# Patient Record
Sex: Female | Born: 1950 | Race: White | Hispanic: No | Marital: Single | Smoking: Current every day smoker
Health system: Southern US, Community
[De-identification: ages and names within clinical notes are randomized; demographics above are authoritative.]

## PROBLEM LIST (undated history)

## (undated) DIAGNOSIS — K7460 Unspecified cirrhosis of liver: Secondary | ICD-10-CM

## (undated) DIAGNOSIS — R109 Unspecified abdominal pain: Secondary | ICD-10-CM

## (undated) DIAGNOSIS — I629 Nontraumatic intracranial hemorrhage, unspecified: Secondary | ICD-10-CM

## (undated) DIAGNOSIS — Z9889 Other specified postprocedural states: Secondary | ICD-10-CM

## (undated) DIAGNOSIS — M1990 Unspecified osteoarthritis, unspecified site: Secondary | ICD-10-CM

## (undated) DIAGNOSIS — M545 Low back pain, unspecified: Secondary | ICD-10-CM

## (undated) DIAGNOSIS — K759 Inflammatory liver disease, unspecified: Secondary | ICD-10-CM

## (undated) DIAGNOSIS — G8929 Other chronic pain: Secondary | ICD-10-CM

## (undated) DIAGNOSIS — K317 Polyp of stomach and duodenum: Secondary | ICD-10-CM

## (undated) DIAGNOSIS — F4310 Post-traumatic stress disorder, unspecified: Secondary | ICD-10-CM

## (undated) DIAGNOSIS — R0602 Shortness of breath: Secondary | ICD-10-CM

## (undated) DIAGNOSIS — K219 Gastro-esophageal reflux disease without esophagitis: Secondary | ICD-10-CM

## (undated) DIAGNOSIS — R918 Other nonspecific abnormal finding of lung field: Secondary | ICD-10-CM

## (undated) DIAGNOSIS — F1910 Other psychoactive substance abuse, uncomplicated: Secondary | ICD-10-CM

## (undated) DIAGNOSIS — B182 Chronic viral hepatitis C: Secondary | ICD-10-CM

## (undated) DIAGNOSIS — Z720 Tobacco use: Secondary | ICD-10-CM

## (undated) DIAGNOSIS — A549 Gonococcal infection, unspecified: Secondary | ICD-10-CM

## (undated) DIAGNOSIS — I10 Essential (primary) hypertension: Secondary | ICD-10-CM

## (undated) DIAGNOSIS — R569 Unspecified convulsions: Secondary | ICD-10-CM

## (undated) DIAGNOSIS — K8020 Calculus of gallbladder without cholecystitis without obstruction: Secondary | ICD-10-CM

## (undated) DIAGNOSIS — D649 Anemia, unspecified: Secondary | ICD-10-CM

## (undated) DIAGNOSIS — F329 Major depressive disorder, single episode, unspecified: Secondary | ICD-10-CM

## (undated) DIAGNOSIS — F32A Depression, unspecified: Secondary | ICD-10-CM

## (undated) DIAGNOSIS — K561 Intussusception: Secondary | ICD-10-CM

## (undated) HISTORY — DX: Chronic viral hepatitis C: B18.2

## (undated) HISTORY — PX: SPINAL FUSION: SHX223

## (undated) HISTORY — DX: Other nonspecific abnormal finding of lung field: R91.8

## (undated) HISTORY — DX: Depression, unspecified: F32.A

## (undated) HISTORY — PX: TONSILLECTOMY: SUR1361

## (undated) HISTORY — DX: Other psychoactive substance abuse, uncomplicated: F19.10

## (undated) HISTORY — DX: Calculus of gallbladder without cholecystitis without obstruction: K80.20

## (undated) HISTORY — DX: Gonococcal infection, unspecified: A54.9

## (undated) HISTORY — DX: Unspecified abdominal pain: R10.9

## (undated) HISTORY — DX: Inflammatory liver disease, unspecified: K75.9

## (undated) HISTORY — DX: Post-traumatic stress disorder, unspecified: F43.10

## (undated) HISTORY — PX: LUMBAR FUSION: SHX111

## (undated) HISTORY — DX: Low back pain: M54.5

## (undated) HISTORY — DX: Major depressive disorder, single episode, unspecified: F32.9

## (undated) HISTORY — DX: Gastro-esophageal reflux disease without esophagitis: K21.9

## (undated) HISTORY — DX: Low back pain, unspecified: M54.50

## (undated) HISTORY — DX: Unspecified cirrhosis of liver: K74.60

## (undated) HISTORY — DX: Other chronic pain: G89.29

## (undated) HISTORY — DX: Nontraumatic intracranial hemorrhage, unspecified: I62.9

## (undated) HISTORY — DX: Tobacco use: Z72.0

## (undated) HISTORY — DX: Other specified postprocedural states: Z98.890

## (undated) HISTORY — DX: Intussusception: K56.1

## (undated) HISTORY — DX: Essential (primary) hypertension: I10

## (undated) SURGERY — SUTURE REPAIR, LACERATION, PERINEUM
Anesthesia: Monitor Anesthesia Care

---
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
## 1996-08-23 DIAGNOSIS — R569 Unspecified convulsions: Secondary | ICD-10-CM

## 1996-08-23 DIAGNOSIS — I629 Nontraumatic intracranial hemorrhage, unspecified: Secondary | ICD-10-CM

## 1996-08-23 HISTORY — DX: Unspecified convulsions: R56.9

## 1996-08-23 HISTORY — DX: Nontraumatic intracranial hemorrhage, unspecified: I62.9

## 2000-08-23 DIAGNOSIS — K8050 Calculus of bile duct without cholangitis or cholecystitis without obstruction: Secondary | ICD-10-CM

## 2000-08-23 HISTORY — PX: CHOLECYSTECTOMY: SHX55

## 2000-08-23 HISTORY — PX: PERCUTANEOUS TRANSHEPATIC CHOLANGIOGRAHPY AND BILLIARY DRAINAGE: SHX2212

## 2000-08-23 HISTORY — DX: Calculus of bile duct without cholangitis or cholecystitis without obstruction: K80.50

## 2001-08-23 HISTORY — PX: OTHER SURGICAL HISTORY: SHX169

## 2002-09-24 ENCOUNTER — Encounter: Admission: RE | Admit: 2002-09-24 | Discharge: 2002-12-23 | Payer: Self-pay

## 2003-01-25 ENCOUNTER — Encounter
Admission: RE | Admit: 2003-01-25 | Discharge: 2003-04-25 | Payer: Self-pay | Admitting: Physical Medicine & Rehabilitation

## 2003-03-06 ENCOUNTER — Encounter: Payer: Self-pay | Admitting: Internal Medicine

## 2003-03-06 ENCOUNTER — Ambulatory Visit (HOSPITAL_COMMUNITY): Admission: RE | Admit: 2003-03-06 | Discharge: 2003-03-06 | Payer: Self-pay | Admitting: Internal Medicine

## 2003-04-22 ENCOUNTER — Encounter
Admission: RE | Admit: 2003-04-22 | Discharge: 2003-07-21 | Payer: Self-pay | Admitting: Physical Medicine & Rehabilitation

## 2003-05-29 ENCOUNTER — Encounter
Admission: RE | Admit: 2003-05-29 | Discharge: 2003-08-27 | Payer: Self-pay | Admitting: Physical Medicine & Rehabilitation

## 2003-07-22 ENCOUNTER — Encounter
Admission: RE | Admit: 2003-07-22 | Discharge: 2003-07-30 | Payer: Self-pay | Admitting: Physical Medicine & Rehabilitation

## 2003-07-31 ENCOUNTER — Encounter
Admission: RE | Admit: 2003-07-31 | Discharge: 2003-10-29 | Payer: Self-pay | Admitting: Physical Medicine & Rehabilitation

## 2003-08-24 DIAGNOSIS — Z9889 Other specified postprocedural states: Secondary | ICD-10-CM

## 2003-08-24 HISTORY — DX: Other specified postprocedural states: Z98.890

## 2003-09-10 ENCOUNTER — Ambulatory Visit (HOSPITAL_COMMUNITY): Admission: RE | Admit: 2003-09-10 | Discharge: 2003-09-10 | Payer: Self-pay | Admitting: Internal Medicine

## 2003-09-10 HISTORY — PX: COLONOSCOPY: SHX174

## 2003-09-10 HISTORY — PX: ESOPHAGOGASTRODUODENOSCOPY: SHX1529

## 2003-09-18 ENCOUNTER — Encounter
Admission: RE | Admit: 2003-09-18 | Discharge: 2003-12-17 | Payer: Self-pay | Admitting: Physical Medicine & Rehabilitation

## 2003-10-30 ENCOUNTER — Encounter
Admission: RE | Admit: 2003-10-30 | Discharge: 2003-12-31 | Payer: Self-pay | Admitting: Physical Medicine & Rehabilitation

## 2003-12-12 ENCOUNTER — Other Ambulatory Visit: Admission: RE | Admit: 2003-12-12 | Discharge: 2003-12-12 | Payer: Self-pay | Admitting: Family Medicine

## 2004-02-10 ENCOUNTER — Inpatient Hospital Stay (HOSPITAL_COMMUNITY): Admission: RE | Admit: 2004-02-10 | Discharge: 2004-02-12 | Payer: Self-pay | Admitting: Neurosurgery

## 2004-04-22 ENCOUNTER — Ambulatory Visit (HOSPITAL_COMMUNITY): Admission: RE | Admit: 2004-04-22 | Discharge: 2004-04-22 | Payer: Self-pay | Admitting: Internal Medicine

## 2004-07-21 ENCOUNTER — Ambulatory Visit: Payer: Self-pay | Admitting: Gastroenterology

## 2004-07-22 ENCOUNTER — Ambulatory Visit (HOSPITAL_COMMUNITY): Admission: RE | Admit: 2004-07-22 | Discharge: 2004-07-22 | Payer: Self-pay | Admitting: Internal Medicine

## 2004-08-03 ENCOUNTER — Ambulatory Visit (HOSPITAL_COMMUNITY): Admission: RE | Admit: 2004-08-03 | Discharge: 2004-08-03 | Payer: Self-pay | Admitting: Internal Medicine

## 2004-12-21 ENCOUNTER — Ambulatory Visit (HOSPITAL_COMMUNITY): Admission: RE | Admit: 2004-12-21 | Discharge: 2004-12-21 | Payer: Self-pay | Admitting: Internal Medicine

## 2005-02-02 ENCOUNTER — Ambulatory Visit: Payer: Self-pay | Admitting: Gastroenterology

## 2005-03-30 ENCOUNTER — Emergency Department (HOSPITAL_COMMUNITY): Admission: EM | Admit: 2005-03-30 | Discharge: 2005-03-30 | Payer: Self-pay | Admitting: Emergency Medicine

## 2005-07-27 ENCOUNTER — Ambulatory Visit: Payer: Self-pay | Admitting: Internal Medicine

## 2005-07-29 ENCOUNTER — Other Ambulatory Visit: Admission: RE | Admit: 2005-07-29 | Discharge: 2005-07-29 | Payer: Self-pay | Admitting: Family Medicine

## 2005-09-10 ENCOUNTER — Ambulatory Visit (HOSPITAL_COMMUNITY): Admission: RE | Admit: 2005-09-10 | Discharge: 2005-09-10 | Payer: Self-pay | Admitting: Family Medicine

## 2005-09-10 ENCOUNTER — Ambulatory Visit (HOSPITAL_COMMUNITY): Admission: RE | Admit: 2005-09-10 | Discharge: 2005-09-10 | Payer: Self-pay | Admitting: Neurosurgery

## 2005-09-30 ENCOUNTER — Encounter
Admission: RE | Admit: 2005-09-30 | Discharge: 2005-12-29 | Payer: Self-pay | Admitting: Physical Medicine & Rehabilitation

## 2005-11-05 ENCOUNTER — Ambulatory Visit: Payer: Self-pay | Admitting: Physical Medicine & Rehabilitation

## 2005-11-22 ENCOUNTER — Encounter (HOSPITAL_COMMUNITY)
Admission: RE | Admit: 2005-11-22 | Discharge: 2005-12-22 | Payer: Self-pay | Admitting: Physical Medicine & Rehabilitation

## 2005-12-17 ENCOUNTER — Ambulatory Visit: Payer: Self-pay | Admitting: Physical Medicine & Rehabilitation

## 2005-12-29 ENCOUNTER — Encounter (HOSPITAL_COMMUNITY)
Admission: RE | Admit: 2005-12-29 | Discharge: 2006-01-28 | Payer: Self-pay | Admitting: Physical Medicine & Rehabilitation

## 2006-02-01 ENCOUNTER — Encounter (HOSPITAL_COMMUNITY)
Admission: RE | Admit: 2006-02-01 | Discharge: 2006-03-03 | Payer: Self-pay | Admitting: Physical Medicine & Rehabilitation

## 2006-02-09 ENCOUNTER — Encounter
Admission: RE | Admit: 2006-02-09 | Discharge: 2006-05-10 | Payer: Self-pay | Admitting: Physical Medicine & Rehabilitation

## 2006-02-09 ENCOUNTER — Ambulatory Visit: Payer: Self-pay | Admitting: Physical Medicine & Rehabilitation

## 2006-03-10 ENCOUNTER — Ambulatory Visit: Payer: Self-pay | Admitting: Internal Medicine

## 2006-03-17 ENCOUNTER — Encounter (HOSPITAL_COMMUNITY): Admission: RE | Admit: 2006-03-17 | Discharge: 2006-04-16 | Payer: Self-pay | Admitting: Internal Medicine

## 2006-04-21 ENCOUNTER — Ambulatory Visit: Payer: Self-pay | Admitting: Physical Medicine & Rehabilitation

## 2006-05-27 ENCOUNTER — Ambulatory Visit: Payer: Self-pay | Admitting: Physical Medicine & Rehabilitation

## 2006-05-27 ENCOUNTER — Encounter
Admission: RE | Admit: 2006-05-27 | Discharge: 2006-08-25 | Payer: Self-pay | Admitting: Physical Medicine & Rehabilitation

## 2006-06-20 ENCOUNTER — Ambulatory Visit: Payer: Self-pay | Admitting: Physical Medicine & Rehabilitation

## 2006-07-26 ENCOUNTER — Ambulatory Visit: Payer: Self-pay | Admitting: Internal Medicine

## 2006-09-01 ENCOUNTER — Ambulatory Visit (HOSPITAL_COMMUNITY): Admission: RE | Admit: 2006-09-01 | Discharge: 2006-09-01 | Payer: Self-pay | Admitting: Internal Medicine

## 2006-09-16 ENCOUNTER — Encounter
Admission: RE | Admit: 2006-09-16 | Discharge: 2006-12-15 | Payer: Self-pay | Admitting: Physical Medicine & Rehabilitation

## 2006-09-19 ENCOUNTER — Ambulatory Visit: Payer: Self-pay | Admitting: Physical Medicine & Rehabilitation

## 2006-12-06 ENCOUNTER — Ambulatory Visit (HOSPITAL_COMMUNITY): Admission: RE | Admit: 2006-12-06 | Discharge: 2006-12-06 | Payer: Self-pay | Admitting: Internal Medicine

## 2007-02-10 ENCOUNTER — Ambulatory Visit: Payer: Self-pay | Admitting: Internal Medicine

## 2007-02-15 ENCOUNTER — Ambulatory Visit (HOSPITAL_COMMUNITY): Admission: RE | Admit: 2007-02-15 | Discharge: 2007-02-15 | Payer: Self-pay | Admitting: Internal Medicine

## 2007-05-19 ENCOUNTER — Ambulatory Visit: Payer: Self-pay | Admitting: Gastroenterology

## 2007-08-28 ENCOUNTER — Other Ambulatory Visit: Payer: Self-pay

## 2007-08-28 ENCOUNTER — Inpatient Hospital Stay (HOSPITAL_COMMUNITY): Admission: RE | Admit: 2007-08-28 | Discharge: 2007-08-31 | Payer: Self-pay | Admitting: Psychiatry

## 2007-08-28 ENCOUNTER — Ambulatory Visit: Payer: Self-pay | Admitting: Psychiatry

## 2007-10-02 ENCOUNTER — Ambulatory Visit (HOSPITAL_COMMUNITY): Admission: RE | Admit: 2007-10-02 | Discharge: 2007-10-02 | Payer: Self-pay | Admitting: Internal Medicine

## 2007-11-24 ENCOUNTER — Ambulatory Visit: Payer: Self-pay | Admitting: Internal Medicine

## 2008-01-01 ENCOUNTER — Ambulatory Visit (HOSPITAL_COMMUNITY): Admission: RE | Admit: 2008-01-01 | Discharge: 2008-01-01 | Payer: Self-pay | Admitting: Family Medicine

## 2008-04-22 DIAGNOSIS — K831 Obstruction of bile duct: Secondary | ICD-10-CM | POA: Insufficient documentation

## 2008-04-22 DIAGNOSIS — K561 Intussusception: Secondary | ICD-10-CM | POA: Insufficient documentation

## 2008-04-22 DIAGNOSIS — F4310 Post-traumatic stress disorder, unspecified: Secondary | ICD-10-CM | POA: Insufficient documentation

## 2008-04-22 DIAGNOSIS — R109 Unspecified abdominal pain: Secondary | ICD-10-CM | POA: Insufficient documentation

## 2008-04-22 DIAGNOSIS — B182 Chronic viral hepatitis C: Secondary | ICD-10-CM | POA: Insufficient documentation

## 2008-04-22 DIAGNOSIS — K660 Peritoneal adhesions (postprocedural) (postinfection): Secondary | ICD-10-CM | POA: Insufficient documentation

## 2008-04-22 DIAGNOSIS — M545 Low back pain, unspecified: Secondary | ICD-10-CM | POA: Insufficient documentation

## 2008-04-22 DIAGNOSIS — I629 Nontraumatic intracranial hemorrhage, unspecified: Secondary | ICD-10-CM | POA: Insufficient documentation

## 2008-04-22 DIAGNOSIS — K219 Gastro-esophageal reflux disease without esophagitis: Secondary | ICD-10-CM | POA: Insufficient documentation

## 2008-04-22 DIAGNOSIS — Z8619 Personal history of other infectious and parasitic diseases: Secondary | ICD-10-CM | POA: Insufficient documentation

## 2008-04-22 DIAGNOSIS — F329 Major depressive disorder, single episode, unspecified: Secondary | ICD-10-CM | POA: Insufficient documentation

## 2008-04-22 DIAGNOSIS — K7460 Unspecified cirrhosis of liver: Secondary | ICD-10-CM

## 2008-04-25 DIAGNOSIS — F17200 Nicotine dependence, unspecified, uncomplicated: Secondary | ICD-10-CM | POA: Insufficient documentation

## 2008-04-25 DIAGNOSIS — F1010 Alcohol abuse, uncomplicated: Secondary | ICD-10-CM | POA: Insufficient documentation

## 2008-05-21 ENCOUNTER — Ambulatory Visit (HOSPITAL_COMMUNITY): Admission: RE | Admit: 2008-05-21 | Discharge: 2008-05-21 | Payer: Self-pay | Admitting: Internal Medicine

## 2008-05-24 ENCOUNTER — Ambulatory Visit: Payer: Self-pay | Admitting: Internal Medicine

## 2008-10-16 ENCOUNTER — Encounter: Payer: Self-pay | Admitting: Urgent Care

## 2008-10-16 ENCOUNTER — Ambulatory Visit: Payer: Self-pay | Admitting: Internal Medicine

## 2008-10-16 ENCOUNTER — Encounter: Payer: Self-pay | Admitting: *Deleted

## 2008-10-16 DIAGNOSIS — R141 Gas pain: Secondary | ICD-10-CM | POA: Insufficient documentation

## 2008-10-16 DIAGNOSIS — G5600 Carpal tunnel syndrome, unspecified upper limb: Secondary | ICD-10-CM | POA: Insufficient documentation

## 2008-10-16 DIAGNOSIS — R142 Eructation: Secondary | ICD-10-CM

## 2008-10-16 DIAGNOSIS — R143 Flatulence: Secondary | ICD-10-CM

## 2008-10-31 ENCOUNTER — Ambulatory Visit (HOSPITAL_COMMUNITY): Admission: RE | Admit: 2008-10-31 | Discharge: 2008-10-31 | Payer: Self-pay | Admitting: Neurosurgery

## 2008-11-07 ENCOUNTER — Other Ambulatory Visit: Admission: RE | Admit: 2008-11-07 | Discharge: 2008-11-07 | Payer: Self-pay | Admitting: Obstetrics and Gynecology

## 2008-11-11 ENCOUNTER — Encounter (INDEPENDENT_AMBULATORY_CARE_PROVIDER_SITE_OTHER): Payer: Self-pay | Admitting: *Deleted

## 2008-11-14 ENCOUNTER — Encounter: Payer: Self-pay | Admitting: Internal Medicine

## 2008-11-19 ENCOUNTER — Encounter (INDEPENDENT_AMBULATORY_CARE_PROVIDER_SITE_OTHER): Payer: Self-pay

## 2008-11-19 ENCOUNTER — Encounter: Payer: Self-pay | Admitting: Urgent Care

## 2008-11-25 ENCOUNTER — Ambulatory Visit (HOSPITAL_COMMUNITY): Admission: RE | Admit: 2008-11-25 | Discharge: 2008-11-25 | Payer: Self-pay | Admitting: Internal Medicine

## 2008-12-09 ENCOUNTER — Encounter: Payer: Self-pay | Admitting: Urgent Care

## 2008-12-09 ENCOUNTER — Encounter: Admission: RE | Admit: 2008-12-09 | Discharge: 2009-03-09 | Payer: Self-pay | Admitting: Neurosurgery

## 2008-12-09 LAB — CONVERTED CEMR LAB
ALT: 25 units/L (ref 0–35)
AST: 29 units/L (ref 0–37)
Albumin: 4.2 g/dL (ref 3.5–5.2)
Alkaline Phosphatase: 129 units/L — ABNORMAL HIGH (ref 39–117)
Basophils Absolute: 0 10*3/uL (ref 0.0–0.1)
Basophils Relative: 0 % (ref 0–1)
Bilirubin, Direct: 0.1 mg/dL (ref 0.0–0.3)
Eosinophils Absolute: 0.2 10*3/uL (ref 0.0–0.7)
Eosinophils Relative: 2 % (ref 0–5)
HCT: 42.2 % (ref 36.0–46.0)
Hemoglobin: 14.5 g/dL (ref 12.0–15.0)
INR: 1 (ref 0.0–1.5)
Indirect Bilirubin: 0.5 mg/dL (ref 0.0–0.9)
Lymphocytes Relative: 29 % (ref 12–46)
Lymphs Abs: 2.6 10*3/uL (ref 0.7–4.0)
MCHC: 34.4 g/dL (ref 30.0–36.0)
MCV: 93.7 fL (ref 78.0–100.0)
Monocytes Absolute: 0.5 10*3/uL (ref 0.1–1.0)
Monocytes Relative: 6 % (ref 3–12)
Neutro Abs: 5.6 10*3/uL (ref 1.7–7.7)
Neutrophils Relative %: 63 % (ref 43–77)
Platelets: 167 10*3/uL (ref 150–400)
Prothrombin Time: 12.8 s (ref 11.6–15.2)
RBC: 4.5 M/uL (ref 3.87–5.11)
RDW: 12.7 % (ref 11.5–15.5)
Total Bilirubin: 0.6 mg/dL (ref 0.3–1.2)
Total Protein: 7.1 g/dL (ref 6.0–8.3)
WBC: 8.9 10*3/uL (ref 4.0–10.5)

## 2008-12-11 LAB — CONVERTED CEMR LAB: AFP-Tumor Marker: 2.9 ng/mL (ref 0.0–8.0)

## 2008-12-26 ENCOUNTER — Ambulatory Visit: Payer: Self-pay | Admitting: Internal Medicine

## 2008-12-30 ENCOUNTER — Encounter: Payer: Self-pay | Admitting: Internal Medicine

## 2009-01-01 ENCOUNTER — Ambulatory Visit (HOSPITAL_COMMUNITY): Admission: RE | Admit: 2009-01-01 | Discharge: 2009-01-01 | Payer: Self-pay | Admitting: Internal Medicine

## 2009-01-09 ENCOUNTER — Encounter: Payer: Self-pay | Admitting: Internal Medicine

## 2009-01-09 ENCOUNTER — Ambulatory Visit (HOSPITAL_COMMUNITY): Admission: RE | Admit: 2009-01-09 | Discharge: 2009-01-09 | Payer: Self-pay | Admitting: Internal Medicine

## 2009-05-01 ENCOUNTER — Encounter: Payer: Self-pay | Admitting: Urgent Care

## 2009-05-14 ENCOUNTER — Encounter: Admission: RE | Admit: 2009-05-14 | Discharge: 2009-05-22 | Payer: Self-pay | Admitting: Neurosurgery

## 2009-05-20 ENCOUNTER — Encounter (INDEPENDENT_AMBULATORY_CARE_PROVIDER_SITE_OTHER): Payer: Self-pay

## 2009-05-20 ENCOUNTER — Encounter: Payer: Self-pay | Admitting: Internal Medicine

## 2009-05-23 ENCOUNTER — Encounter: Admission: RE | Admit: 2009-05-23 | Discharge: 2009-08-21 | Payer: Self-pay | Admitting: Neurosurgery

## 2009-06-02 ENCOUNTER — Encounter: Payer: Self-pay | Admitting: Internal Medicine

## 2009-06-02 LAB — CONVERTED CEMR LAB: AFP-Tumor Marker: 5.7 ng/mL (ref 0.0–8.0)

## 2009-06-13 ENCOUNTER — Encounter (INDEPENDENT_AMBULATORY_CARE_PROVIDER_SITE_OTHER): Payer: Self-pay | Admitting: *Deleted

## 2009-06-23 ENCOUNTER — Encounter: Payer: Self-pay | Admitting: Urgent Care

## 2009-08-29 ENCOUNTER — Encounter: Payer: Self-pay | Admitting: Internal Medicine

## 2009-09-01 ENCOUNTER — Ambulatory Visit (HOSPITAL_COMMUNITY): Admission: RE | Admit: 2009-09-01 | Discharge: 2009-09-01 | Payer: Self-pay | Admitting: Internal Medicine

## 2009-09-22 ENCOUNTER — Inpatient Hospital Stay (HOSPITAL_COMMUNITY): Admission: RE | Admit: 2009-09-22 | Discharge: 2009-09-27 | Payer: Self-pay | Admitting: Neurosurgery

## 2009-11-10 ENCOUNTER — Encounter (INDEPENDENT_AMBULATORY_CARE_PROVIDER_SITE_OTHER): Payer: Self-pay

## 2009-12-04 ENCOUNTER — Other Ambulatory Visit: Admission: RE | Admit: 2009-12-04 | Discharge: 2009-12-04 | Payer: Self-pay | Admitting: Obstetrics and Gynecology

## 2009-12-16 ENCOUNTER — Encounter: Payer: Self-pay | Admitting: Internal Medicine

## 2009-12-16 LAB — CONVERTED CEMR LAB: AFP-Tumor Marker: 3.1 ng/mL (ref 0.0–8.0)

## 2010-01-05 ENCOUNTER — Telehealth (INDEPENDENT_AMBULATORY_CARE_PROVIDER_SITE_OTHER): Payer: Self-pay | Admitting: *Deleted

## 2010-02-16 ENCOUNTER — Ambulatory Visit (HOSPITAL_COMMUNITY): Admission: RE | Admit: 2010-02-16 | Discharge: 2010-02-16 | Payer: Self-pay | Admitting: Internal Medicine

## 2010-03-04 ENCOUNTER — Encounter: Admission: RE | Admit: 2010-03-04 | Discharge: 2010-06-04 | Payer: Self-pay | Admitting: Neurosurgery

## 2010-04-14 ENCOUNTER — Encounter (INDEPENDENT_AMBULATORY_CARE_PROVIDER_SITE_OTHER): Payer: Self-pay | Admitting: *Deleted

## 2010-04-20 ENCOUNTER — Encounter (INDEPENDENT_AMBULATORY_CARE_PROVIDER_SITE_OTHER): Payer: Self-pay | Admitting: *Deleted

## 2010-05-11 ENCOUNTER — Encounter (INDEPENDENT_AMBULATORY_CARE_PROVIDER_SITE_OTHER): Payer: Self-pay

## 2010-05-19 ENCOUNTER — Encounter: Payer: Self-pay | Admitting: Internal Medicine

## 2010-05-27 ENCOUNTER — Ambulatory Visit (HOSPITAL_COMMUNITY): Admission: RE | Admit: 2010-05-27 | Discharge: 2010-05-27 | Payer: Self-pay | Admitting: Internal Medicine

## 2010-06-05 ENCOUNTER — Encounter
Admission: RE | Admit: 2010-06-05 | Discharge: 2010-08-20 | Payer: Self-pay | Source: Home / Self Care | Attending: Neurosurgery | Admitting: Neurosurgery

## 2010-06-21 LAB — CONVERTED CEMR LAB: AFP-Tumor Marker: 3.8 ng/mL (ref 0.0–8.0)

## 2010-08-05 ENCOUNTER — Telehealth (INDEPENDENT_AMBULATORY_CARE_PROVIDER_SITE_OTHER): Payer: Self-pay

## 2010-08-05 ENCOUNTER — Ambulatory Visit: Payer: Self-pay | Admitting: Internal Medicine

## 2010-08-11 ENCOUNTER — Encounter: Payer: Self-pay | Admitting: Internal Medicine

## 2010-08-27 ENCOUNTER — Ambulatory Visit (HOSPITAL_COMMUNITY)
Admission: RE | Admit: 2010-08-27 | Discharge: 2010-08-27 | Payer: Self-pay | Source: Home / Self Care | Attending: Internal Medicine | Admitting: Internal Medicine

## 2010-08-27 DIAGNOSIS — Z9889 Other specified postprocedural states: Secondary | ICD-10-CM

## 2010-08-27 HISTORY — PX: ESOPHAGOGASTRODUODENOSCOPY: SHX1529

## 2010-08-27 HISTORY — DX: Other specified postprocedural states: Z98.890

## 2010-09-11 ENCOUNTER — Ambulatory Visit (HOSPITAL_COMMUNITY)
Admission: RE | Admit: 2010-09-11 | Discharge: 2010-09-11 | Payer: Self-pay | Source: Home / Self Care | Attending: Neurology | Admitting: Neurology

## 2010-09-13 ENCOUNTER — Encounter: Payer: Self-pay | Admitting: Family Medicine

## 2010-09-22 NOTE — Miscellaneous (Signed)
Summary: Orders Update  Clinical Lists Changes  Orders: Added new Test order of T-AFP Tumor Markers ([id number]) - Signed 

## 2010-09-22 NOTE — Letter (Signed)
Summary: ABD [place] ORDER  ABD [place] ORDER   Imported By: Ave Filter 08/29/2009 12:59:41  _____________________________________________________________________  External Attachment:    Type:   Image     Comment:   External Document

## 2010-09-22 NOTE — Letter (Signed)
Summary: Chest CT Order  Chest CT Order   Imported By: Ave Filter 12/30/2008 12:39:18  _____________________________________________________________________  External Attachment:    Type:   Image     Comment:   External Document

## 2010-09-22 NOTE — Letter (Signed)
Summary: Recall, Labs Needed  [facility]  [address]   [place], [place] [address]   Phone: [phone]  Fax: [phone]    May 11, 2010  FATE GALANTI [address] [place], [place]  [address] 1950-12-20   Dear Ms. Cuello,   Our records indicate it is time to repeat your blood work.  You can take the enclosed form to the lab on or near the date indicated.  Please make note of the new location of the lab:   [address], 2nd floor   [facility]  Our office will call you within a week to ten business days with the results.  If you do not hear from [place] in 10 business days, you should call the office.  If you have any questions regarding this, call the office at [phone], and ask for the nurse.  Labs are due on 06/17/2010.   Sincerely,    Hendricks Limes LPN  [facility] Ph: [phone]   Fax: [phone]

## 2010-09-22 NOTE — Miscellaneous (Signed)
Summary: Orders Update  Clinical Lists Changes  Orders: Added new Test order of T-AFP Tumor Marker [id number]) - Signed Added new Test order of T-Hepatic Function [id number]) - Signed Added new Test order of T-CBC w/Diff ([id number]) - Signed Added new Test order of T-PT (Prothrombin Time) ([id number]) - Signed

## 2010-09-22 NOTE — Op Note (Signed)
  NAME:  Sabrina Wyatt, Sabrina Wyatt              ACCOUNT NO.:  [id number]  MEDICAL RECORD NO.:  [id number]          PATIENT TYPE:  AMB  LOCATION:  DAY                           FACILITY:  [facility]  PHYSICIAN:  R. Roetta Sessions, M.D. DATE OF BIRTH:  [birth year]  DATE OF PROCEDURE:  08/27/2010 DATE OF DISCHARGE:                              OPERATIVE REPORT   SCREENING EGD  INDICATIONS FOR PROCEDURE:  A  60 year old lady with known cirrhosis secondary to hepatitis C (status post documented eradication with antiviral therapy) and history of EtOH abuse, comes for screening EGD to check for varices.  She has done very well clinically more recently.  No history of varices on prior EGD.  EGD now being done.  Risks, benefits, limitations, alternatives, imponderables have been discussed, questions answered.  Please see the documentation in the medical record.  PROCEDURE NOTE:  O2 saturation, blood pressure, pulse, and respirations were monitored throughout the entire procedure.  SEDATION:  Propofol sedation per Anesthesia.  Cetacaine spray for topical pharyngeal anesthesia.  INSTRUMENT:  Pentax video chip system.  FINDINGS:  Examination of the tubular esophagus revealed normal- appearing esophageal mucosa.  There were no varices.  EG junction easily traversed.  Stomach:  Gastric cavity was emptied, insufflated well with air.  Thorough examination of the gastric mucosa including retroflexion of the proximal stomach esophagogastric junction demonstrated only a couple of antral erosions.  Appeared to be trivial, otherwise the gastric mucosa appeared entirely normal.  Pylorus was patent, easily traversed.  Examination of the bulb and second portion revealed no abnormalities.  THERAPEUTIC/DIAGNOSTIC MANEUVERS PERFORMED:  Scope:  None.  The patient tolerated the procedure well, was taken to PACU in good condition.  IMPRESSION:  Normal esophagus.  No varices.  Couple of tiny antral erosions of doubtful  clinical significance, otherwise normal stomach, D1 and D2.  PLAN AND RECOMMENDATIONS: 1. We will plan to see this nice lady back in 1 year. 2. Plan for screening EGD in 2 years from now.     Jonathon Bellows, M.D.     RMR/MEDQ  D:  08/27/2010  T:  08/27/2010  Job:  [id number]  Electronically Signed by Lorrin Goodell M.D. on 09/22/2010 09:08:54 AM

## 2010-09-22 NOTE — Letter (Signed)
Summary: Recall Radiology  [facility]  [address]   [place], [place] [address]   Phone: [phone]  Fax: [phone]    April 14, 2010  Sabrina Wyatt [address] [place], [place]  [address] [birth year]   Dear [name],   Our office needs to get you scheduled for your Ultrasound. Please give our office a call to schedule this.  You may call the office at your convenience at [phone].  Please ask for the Referral Coordinator to make arrangements for this to be scheduled.  You may have to leave a message on our voice mail.  We will return your call.  If for any reason you do not wish to schedule this, please advise the office.  Please do not neglect your health.   Thank you,    Ave Filter  [facility] Ph: [phone]   Fax: [phone]

## 2010-09-22 NOTE — Progress Notes (Signed)
Summary: return call regarding mail order meds  Phone Note Call from Patient   Reason for Call: Refill Medication, Talk to Nurse Summary of Call: Please return pt's call regarding her med refills and mail script. [phone] Initial call taken by: Diana Eves,  Jan 05, 2010 9:31 AM     Appended Document: return call regarding mail order meds tried to call pt- [facility]

## 2010-09-22 NOTE — Letter (Signed)
Summary: Order Changer Per Dr Tyron Russell  Order Changer Per Dr Tyron Russell   Imported By: Ave Filter 01/09/2009 12:38:41  _____________________________________________________________________  External Attachment:    Type:   Image     Comment:   External Document

## 2010-09-22 NOTE — Letter (Signed)
Summary: Recall Radiology  [facility]  [address]   [place], [place] [address]   Phone: [phone]  Fax: [phone]    November 11, 2008  Sabrina Wyatt [address] [place], [place]  [address] 12/20/1950   Dear [name],   Our office needs to get you scheduled for your Ultrasound. Please give our office a call to schedule this.  You may call the office at your convenience at [phone].  Please ask for the Referral Coordinator to make arrangements for this to be scheduled.  You may have to leave a message on our voice mail.  We will return your call.  If for any reason you do not wish to schedule this, please advise the office.  Please do not neglect your health.   Thank you,    Ave Filter  [facility] Ph: [phone]   Fax: [phone]

## 2010-09-22 NOTE — Assessment & Plan Note (Signed)
Summary: Abd bloating   Visit Type:  Follow-up Visit PCP:  Dr. Felecia Shelling  Chief Complaint:  Abd Bloating.  History of Present Illness:      This is a 60 years old female who presents with nausea, bloating.  The symptoms began >1 year ago.  On a scale of mild to severe, the intensity is described as a mild.  The patient complains of nausea, but denies vomiting.  Symptoms are triggered by smell of food, stress, and medication.  Symptoms appear better with rest.  Treatment tried in the past that was effective includes PPI's.    c/o abd bloating and transient nausea.  no vomiting.  No weight loss.  Appetite normal.    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  She does not have hepatitis A,    hepatitis B surface antibody is positive.  Hepatitis A total antibody   has not been done.  Intussusception seen on 09/01/2006 CT.  She was seeing Dr. Wynn Banker at the pain clinic, but he has   discharged her from the practice because she missed too many   appointments.   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 Once daily 2)  Flonase 50 Mcg/act Susp (Fluticasone propionate) .... Once daily 3)  Glucosamine-chondroitin Caps (Glucosamine-chondroit-vit c-mn) .... Once daily 4)  Omeprazole 20 Mg Tbec (Omeprazole) .... Once daily 5)  Lidoderm 5 % Ptch (Lidocaine) .... Up to 3 patches. 12 hours on and 12 hours off  Allergies: No Known Drug Allergies  Review of Systems      See HPI       The patient complains of back pain, fatigue, and night sweats.  The patient denies allergy/sinus, anemia, anxiety-new, arthritis/joint pain, blood in urine, breast changes/lumps, change in vision, confusion, cough, coughing up blood, fainting, fever, headaches-new, hearing problems, heart murmur, heart rhythm changes, itching, menstrual pain, muscle pains/cramps, nosebleeds, pregnancy symptoms, shortness of breath, skin rash, sleeping problems, sore throat, swelling of feet/legs, swollen lymph glands, thirst - excessive, urination - excessive, urination changes/pain, urine leakage, vision changes, and voice change.    GI      Complains of nausea, indigestion/heartburn, abdominal pain, and gas/bloating.      Denies difficulty swallowing, pain on swallowing, vomiting, vomiting blood, jaundice, diarrhea, constipation, change in bowel habits, bloody BM's, black BMs, and fecal incontinence.  Vital Signs:  Patient Profile:   60 Years Old Female LMP:     1998 Height:     65  inches Weight:      142 pounds BMI:     23.72 Temp:     98.3 degrees F oral Pulse rate:   60 / minute Resp:     20 per minute BP sitting:   122 / 78  (right arm)  Pt. in pain?   no  Menstrual History: LMP (date): 1998                 Physical Exam  General:     Well developed, well nourished, no acute distress. Nose:     No deformity, discharge,  or lesions. Mouth:     No deformity or lesions, dentition normal. Neck:     Supple; no masses or  thyromegaly. Chest Wall:     Symmetrical;  no deformities or tenderness. Lungs:     Clear throughout to auscultation. Heart:     Regular rate and rhythm; no murmurs, rubs,  or bruits. Abdomen:     Soft, nontender and nondistended. No masses, hepatosplenomegaly or hernias noted. Normal bowel sounds.without guarding and without rebound.   Msk:     Symmetrical with no gross deformities. Normal posture. Pulses:     Normal pulses noted. Extremities:     No clubbing, cyanosis, edema or deformities noted. Neurologic:     Alert and  oriented x4;  grossly normal neurologically. Skin:     Intact without significant lesions or rashes. Cervical Nodes:     No significant cervical adenopathy. Axillary Nodes:     No significant axillary adenopathy. Inguinal Nodes:     No significant inguinal adenopathy. Psych:     Alert and cooperative. Normal mood and affect.  Impression & Recommendations:  Problem # 1:  ABDOMINAL BLOATING (ICD-787.3) Assessment: Unchanged May benefit from probiotic.  Trial of align.    Problem # 2:  Hx of HEPATITIS C, HX OF (ICD-V12.09) Assessment: Unchanged She will be due for abd ultrasound to screen for [facility] and LFTs, CBC, INR, & AFP in April 2010.   Requesting disability letter be sent.  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 2)  She will need ultrasound and labs prior to appt.            Appended Document: Abd bloating Please call pt and let her know that I do not have disability paperwork to fill out for her.  She will need to get specific paperwork if she needs it filled out and bring it to the office & allow 2 weeks for me to fill out.  Thanks.  Appended Document: Abd bloating Pt was informed.

## 2010-09-22 NOTE — Assessment & Plan Note (Signed)
Summary: FU OV 2 MO WITH RMR PER KJ,ABD BLOATING/AMS   Visit Type:  Follow-up Visit Primary Care Provider:  [facility]  Chief Complaint:  abd bloating.  History of Present Illness: Pt still having alot of bloating Ms. Gipson complains of chronic abdominal bloating and distention. She saw Dr. Angelyn Punt over at [facility] who told her that she would have to live with abdominal bloating and that no  surgeon in the [place] would ever try and improve on her current situation.  She denies nausea vomiting melena hematochezia.  recent screening ultrasound the liver demonstrated a cirrhotic-appearing liver but no sign of hepatoma her labs were completely normal;  her alpha-fetoprotein was 2.9.   Current Medications (verified): 1)  Cymbalta 60 Mg Cpep (Duloxetine Hcl) .... Once Daily 2)  Flonase 50 Mcg/act Susp (Fluticasone Propionate) .... As Needed 3)  Glucosamine-Chondroitin  Caps (Glucosamine-Chondroit-Vit C-Mn) .... Once Daily 4)  Omeprazole 20 Mg Tbec (Omeprazole) .... Once Daily 5)  Lidoderm 5 % Ptch (Lidocaine) .... Up To 3 Patches. 12 Hours On and 12 Hours Off  Allergies (verified): No Known Drug Allergies  Family History:    Father: deceased @ late 67's brain tumor    Mother: living  healthy    Siblings: 3  Social History:    Marital Status: No    Children: one    Occupation: none  Vital Signs:  Patient profile:   60 year old female Height:      66 inches Weight:      141 pounds BMI:     22.84 Temp:     99.0 degrees F oral Pulse rate:   88 / minute BP sitting:   122 / 70  (left arm) Cuff size:   regular  Vitals Entered By: Cloria Spring LPN (Dec 26, 452 2:15 PM)  Physical Exam  General:  alert conversant no acute distress no scleral icterus Lungs:  lungs are clear to auscultation Heart:  cardiac regular rate and rhythm without murmur gallop rub Abdomen:  somewhat asymmetrical. Multiple surgical scars. Positive bowel sounds entirely soft and nontender without  any obvious mass or organomegaly  Impression & Recommendations: chronic abdominal bloating/abdominal wall syndrome.  I doubt she has any new visceral processes to account for these chronic symptoms. history of well compensated cirrhosis. History of chronic hepatitis C infection previously eradicated. History of stable pulmonary nodule for which radiology has recommended one more chest CT this year.  Recommendations: Chest CT to wrap up work up of stable pulmonary nodule repeat alpha-fetoprotein and ultrasound 6 months Exercise program emphasizing abdominal wall strengthening exercises Further workup pending review of chest CT  Appended Document: Orders Update-charge    Clinical Lists Changes  Orders: Added new Service order of Est. Patient Level III (09811) - Signed

## 2010-09-22 NOTE — Medication Information (Signed)
Summary: Tax adviser   Imported By: Rosine Beat 04/20/2010 09:05:38  _____________________________________________________________________  External Attachment:    Type:   Image     Comment:   External Document

## 2010-09-22 NOTE — Letter (Signed)
Summary: ABD [place] ORDER  ABD [place] ORDER   Imported By: Ave Filter 05/19/2010 11:36:47  _____________________________________________________________________  External Attachment:    Type:   Image     Comment:   External Document  Appended Document: ABD [place] ORDER no precertification required.

## 2010-09-22 NOTE — Letter (Signed)
Summary: Recall, Labs Needed  [facility]  [address]   [place], [place] [address]   Phone: [phone]  Fax: [phone]    November 10, 2009  Sabrina Wyatt [address] [place], [place]  [address] [birth year]   Dear Ms. Amble,   Our records indicate it is time to repeat your blood work.  You can take the enclosed form to the lab on or near the date indicated.  Please make note of the new location of the lab:   [address], 2nd floor   [facility]  Our office will call you within a week to ten business days with the results.  If you do not hear from [place] in 10 business days, you should call the office.  If you have any questions regarding this, call the office at [phone], and ask for the nurse.  Labs are due on 12/02/2009.   Sincerely,    Hendricks Limes LPN  [facility] Ph: [phone]   Fax: [phone]

## 2010-09-22 NOTE — Letter (Signed)
Summary: Recall, Labs Needed  [facility]  [address]   [place], [place] [address]   Phone: [phone]  Fax: [phone]    November 19, 2008  ENSLIE SAHOTA [address] [place], [place]  [address] 12-May-1951   Dear Ms. Sovine,   Our records indicate it is time to repeat your blood work.  You can take the enclosed form to the lab on or near the date indicated.  Please make note of the new location of the lab:   [address], 2nd floor   [facility]  Our office will call you within a week to ten business days with the results.  If you do not hear from [place] in 10 business days, you should call the office.  If you have any questions regarding this, call the office at [phone], and ask for the nurse.  Labs are due 12/16/08.   Sincerely,    Hendricks Limes LPN  [facility] Ph: [phone]   Fax: [phone]

## 2010-09-22 NOTE — Letter (Signed)
Summary: Recall, Labs Needed  [facility]  [address]   [place], [place] [address]   Phone: [phone]  Fax: [phone]    May 20, 2009  Sabrina Wyatt [address] [place], [place]  [address] 01/19/1951   Dear [name],   Our records indicate it is time to repeat your blood work.  You can take the enclosed form to the lab on or near the date indicated.  Please make note of the new location of the lab:   [address], 2nd floor   [facility]  Our office will call you within a week to ten business days with the results.  If you do not hear from [place] in 10 business days, you should call the office.  If you have any questions regarding this, call the office at [phone], and ask for the nurse.  Labs are due on 05/29/09.   Sincerely,  Hendricks Limes LPN  [facility] Ph: [phone]   Fax: [phone]

## 2010-09-22 NOTE — Medication Information (Signed)
Summary: Tax adviser   Imported By: Diana Eves 05/01/2009 08:23:20  _____________________________________________________________________  External Attachment:    Type:   Image     Comment:   External Document  Appended Document: RX LIDODERM PATCH    Prescriptions: LIDODERM 5 % PTCH (LIDOCAINE) up to 3 patches. 12 hours on and 12 hours off  #90 x 2   Entered and Authorized by:   Joselyn Arrow FNP-BC   Signed by:   Joselyn Arrow FNP-BC on 05/01/2009   Method used:   Electronically to        [facility] [phone]* (retail)       [address]       [place], [place]  [address]       Ph: [phone] or [phone]       Fax: [phone]   RxID:   [id number]

## 2010-09-22 NOTE — Letter (Signed)
Summary: ABD U/S Order  ABD U/S Order   Imported By: Elinor Parkinson 11/14/2008 12:38:22  _____________________________________________________________________  External Attachment:    Type:   Image     Comment:   External Document

## 2010-09-22 NOTE — Medication Information (Signed)
Summary: Tax adviser   Imported By: Diana Eves 06/23/2009 09:58:55  _____________________________________________________________________  External Attachment:    Type:   Image     Comment:   External Document  Appended Document: RX FolderOMEPRAZOLE    Prescriptions: OMEPRAZOLE 20 MG TBEC (OMEPRAZOLE) once daily  #30 x 11   Entered and Authorized by:   Joselyn Arrow FNP-BC   Signed by:   Joselyn Arrow FNP-BC on 06/23/2009   Method used:   Electronically to        [facility] [phone]* (retail)       [address]       [place], [place]  [address]       Ph: [phone] or [phone]       Fax: [phone]   RxID:   [id number]

## 2010-09-24 NOTE — Progress Notes (Signed)
Summary: omeprazole refill  Phone Note Call from Patient   Caller: Patient Summary of Call: pt requesting refill on Omeprazole- ok to call in per RMR. Called in Omeprazole 20mg  1 by mouth once daily  #30 11RF to [facility] Initial call taken by: Hendricks Limes LPN,  August 05, 2010 10:57 AM

## 2010-09-24 NOTE — Assessment & Plan Note (Signed)
Summary: NEEDS NEW RX/LAW   Visit Type:  Follow-up Visit Primary Care Provider:  [place]  Chief Complaint:  wants to discuss medical changes with RMR. Marland Kitchen  History of Present Illness: 60 year old lady with a history of a hepatitis C cirrhosis s/p eradication previous with antiviral therapy  complicated gallbladder surgery/ chronic abdominal pain, chronic back pain here for followup. Last EGD 2005. No varices. Alpha-fetoprotein normal; ultrasound negative for hepatoma October of this year. She is due for routine average risk screening colonoscopy 2015. She's had hx of back surgery since she was last here is due to have carpal tunnel and basal joint surgery in the near future. She is  somewhat overdue for a variceal screening via EGD. She has been using Lidoderm patches periodically for chronic abdominal wall pain. Her  3rd party payor has refused to refill any further because of the limited indications for the medication. She was seeing Dr. Wynn Banker- pain management clinic physician. However, she missed too many appointments and was discharged from the practice.  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 Once Daily 2)  Omeprazole 20 Mg Tbec  (Omeprazole) .... Once Daily 3)  Percocet 5-325 Mg Tabs (Oxycodone-Acetaminophen) .... As Needed Pain 4)  Etodolac 400 Mg Tabs (Etodolac) .... Two Times A Day 5)  Valium 5 Mg Tabs (Diazepam) .... As Needed 6)  Caltrate 600+d Plus 600-400 Mg-Unit Tabs (Calcium Carbonate-Vit D-Min) .... Once Daily 7)  Fish Oil 1000 Mg Caps (Omega-3 Fatty Acids) .... Once Daily 8)  Echinacea-Golden Seal 180-45 Mg Caps (Echinacea-Goldenseal) .... Once Daily  Allergies (verified): No Known Drug Allergies  Past History:  Past Medical History: GERD carpal tunnel Depression Hepatitis C Cirrhosis  Family History: Father: deceased @ late 47's brain tumor Mother: living  healthy Siblings: 3 No FH of Colon Cancer:  Vital Signs:  Patient profile:   60 year old female Height:      66 inches Weight:      146 pounds BMI:     23.65 Temp:     98.4 degrees F oral Pulse rate:   84 / minute BP sitting:   128 / 84  (left arm) Cuff size:   regular  Vitals Entered By: Hendricks Limes LPN (August 05, 2010 10:20 AM)  Physical Exam  General:  pleasant somewhat disheveled 60 year old lady resting comfortably. She has multiple tattoos. Eyes:  no scleral icterus Lungs:  clear to auscultation Heart:  regular rate and rhythm without murmur gallop rub Abdomen:  abdomen full multiple surgical scars. Positive bowel sounds soft nontender without appreciable mass or organomegaly  Impression & Recommendations: Impression: Pleasant 60 year old lady with a history of hepatitis C  and distant EtOH)  related cirrhosis. With documented eradication of hepatitis C by PCR  previously, overall doing extremely well from a GI standpoint. She needs to have an EGD to screen for esophageal varices. She has quite a bit of chronic pain. I'm somewhat disappointed that her pain management physician relationships has been severed.  Recommendations: She'll need an alpha-fetoprotein and ultrasound performed in April of 2012. We'll go ahead and  set her up for a screening EGD in the OR under propofol January 2012. Potential for  banding reviewed- if she has varices. Risks, benefits, limitations, alternatives and imponderables review. Her questions answered. She is agreeable. Further recommendations to follow.  Appended Document: Orders Update    Clinical Lists Changes  Orders: Added new Service order of Est. Patient Level III (62376) - Signed

## 2010-09-24 NOTE — Letter (Signed)
Summary: EGD ORDER  EGD ORDER   Imported By: Ave Filter 08/11/2010 16:05:05  _____________________________________________________________________  External Attachment:    Type:   Image     Comment:   External Document

## 2010-11-02 LAB — BASIC METABOLIC PANEL
BUN: 5 mg/dL — ABNORMAL LOW (ref 6–23)
CO2: 27 mEq/L (ref 19–32)
Calcium: 9.2 mg/dL (ref 8.4–10.5)
Chloride: 104 mEq/L (ref 96–112)
Creatinine, Ser: 0.7 mg/dL (ref 0.4–1.2)
GFR calc Af Amer: >60 mL/min (ref >60)
GFR calc non Af Amer: >60 mL/min (ref >60)
Glucose, Bld: 96 mg/dL (ref 70–99)
Potassium: 3.9 mEq/L (ref 3.5–5.1)
Sodium: 141 mEq/L (ref 135–145)

## 2010-11-02 LAB — HEMOGLOBIN AND HEMATOCRIT, BLOOD
HCT: 41.6 % (ref 36.0–46.0)
Hemoglobin: 13.7 g/dL (ref 12.0–15.0)

## 2010-11-08 LAB — TYPE AND SCREEN
ABO/RH(D): A POS
Antibody Screen: NEGATIVE

## 2010-11-08 LAB — CBC
HCT: 42.2 % (ref 36.0–46.0)
Hemoglobin: 14.2 g/dL (ref 12.0–15.0)
MCHC: 33.8 g/dL (ref 30.0–36.0)
MCV: 97.6 fL (ref 78.0–100.0)
Platelets: 151 10*3/uL (ref 150–400)
RBC: 4.32 MIL/uL (ref 3.87–5.11)
RDW: 12.3 % (ref 11.5–15.5)
WBC: 9.3 10*3/uL (ref 4.0–10.5)

## 2010-11-08 LAB — DIFFERENTIAL
Basophils Absolute: 0.1 10*3/uL (ref 0.0–0.1)
Basophils Relative: 1 % (ref 0–1)
Eosinophils Absolute: 0.5 10*3/uL (ref 0.0–0.7)
Eosinophils Relative: 5 % (ref 0–5)
Lymphocytes Relative: 23 % (ref 12–46)
Lymphs Abs: 2.1 10*3/uL (ref 0.7–4.0)
Monocytes Absolute: 0.6 10*3/uL (ref 0.1–1.0)
Monocytes Relative: 7 % (ref 3–12)
Neutro Abs: 6 10*3/uL (ref 1.7–7.7)
Neutrophils Relative %: 65 % (ref 43–77)

## 2010-11-08 LAB — ABO/RH: ABO/RH(D): A POS

## 2010-11-08 LAB — BASIC METABOLIC PANEL
BUN: 11 mg/dL (ref 6–23)
CO2: 25 mEq/L (ref 19–32)
Calcium: 8.9 mg/dL (ref 8.4–10.5)
Chloride: 103 mEq/L (ref 96–112)
Creatinine, Ser: 0.7 mg/dL (ref 0.4–1.2)
GFR calc Af Amer: >60 mL/min (ref >60)
GFR calc non Af Amer: >60 mL/min (ref >60)
Glucose, Bld: 101 mg/dL — ABNORMAL HIGH (ref 70–99)
Potassium: 3.9 mEq/L (ref 3.5–5.1)
Sodium: 137 mEq/L (ref 135–145)

## 2010-11-10 ENCOUNTER — Ambulatory Visit: Payer: Medicare Other | Attending: Neurosurgery | Admitting: Physical Therapy

## 2010-11-10 DIAGNOSIS — M546 Pain in thoracic spine: Secondary | ICD-10-CM | POA: Insufficient documentation

## 2010-11-10 DIAGNOSIS — M545 Low back pain, unspecified: Secondary | ICD-10-CM | POA: Insufficient documentation

## 2010-11-10 DIAGNOSIS — IMO0001 Reserved for inherently not codable concepts without codable children: Secondary | ICD-10-CM | POA: Insufficient documentation

## 2010-11-11 LAB — BASIC METABOLIC PANEL
BUN: 7 mg/dL (ref 6–23)
CO2: 30 mEq/L (ref 19–32)
Calcium: 8.8 mg/dL (ref 8.4–10.5)
Chloride: 106 mEq/L (ref 96–112)
Creatinine, Ser: 0.63 mg/dL (ref 0.4–1.2)
GFR calc Af Amer: >60 mL/min (ref >60)
GFR calc non Af Amer: >60 mL/min (ref >60)
Glucose, Bld: 131 mg/dL — ABNORMAL HIGH (ref 70–99)
Potassium: 4.1 mEq/L (ref 3.5–5.1)
Sodium: 141 mEq/L (ref 135–145)

## 2010-11-11 LAB — CBC
HCT: 35.9 % — ABNORMAL LOW (ref 36.0–46.0)
Hemoglobin: 12.4 g/dL (ref 12.0–15.0)
MCHC: 34.7 g/dL (ref 30.0–36.0)
MCV: 97.3 fL (ref 78.0–100.0)
Platelets: 135 10*3/uL — ABNORMAL LOW (ref 150–400)
RBC: 3.69 MIL/uL — ABNORMAL LOW (ref 3.87–5.11)
RDW: 12.3 % (ref 11.5–15.5)
WBC: 14.1 10*3/uL — ABNORMAL HIGH (ref 4.0–10.5)

## 2010-11-16 ENCOUNTER — Ambulatory Visit: Payer: Medicare Other | Attending: Neurology | Admitting: Physical Therapy

## 2010-11-16 DIAGNOSIS — M545 Low back pain, unspecified: Secondary | ICD-10-CM | POA: Insufficient documentation

## 2010-11-16 DIAGNOSIS — M546 Pain in thoracic spine: Secondary | ICD-10-CM | POA: Insufficient documentation

## 2010-11-16 DIAGNOSIS — IMO0001 Reserved for inherently not codable concepts without codable children: Secondary | ICD-10-CM | POA: Insufficient documentation

## 2010-11-20 ENCOUNTER — Encounter: Payer: Self-pay | Admitting: Internal Medicine

## 2010-11-20 ENCOUNTER — Ambulatory Visit: Payer: Medicare Other | Attending: Neurology | Admitting: Physical Therapy

## 2010-11-20 DIAGNOSIS — M546 Pain in thoracic spine: Secondary | ICD-10-CM | POA: Insufficient documentation

## 2010-11-20 DIAGNOSIS — M545 Low back pain, unspecified: Secondary | ICD-10-CM | POA: Insufficient documentation

## 2010-11-20 DIAGNOSIS — IMO0001 Reserved for inherently not codable concepts without codable children: Secondary | ICD-10-CM | POA: Insufficient documentation

## 2010-11-25 ENCOUNTER — Encounter: Payer: Medicare Other | Admitting: Physical Therapy

## 2010-12-01 ENCOUNTER — Encounter: Payer: Medicare Other | Admitting: Physical Therapy

## 2010-12-01 LAB — CREATININE, SERUM
Creatinine, Ser: 0.59 mg/dL (ref 0.4–1.2)
GFR calc Af Amer: >60 mL/min (ref >60)
GFR calc non Af Amer: >60 mL/min (ref >60)

## 2010-12-03 ENCOUNTER — Ambulatory Visit: Payer: Medicare Other | Attending: Neurology | Admitting: Physical Therapy

## 2010-12-03 DIAGNOSIS — IMO0001 Reserved for inherently not codable concepts without codable children: Secondary | ICD-10-CM | POA: Insufficient documentation

## 2010-12-03 DIAGNOSIS — M545 Low back pain, unspecified: Secondary | ICD-10-CM | POA: Insufficient documentation

## 2010-12-03 DIAGNOSIS — M546 Pain in thoracic spine: Secondary | ICD-10-CM | POA: Insufficient documentation

## 2010-12-03 LAB — CREATININE, SERUM
Creatinine, Ser: 0.64 mg/dL (ref 0.4–1.2)
GFR calc Af Amer: >60 mL/min (ref >60)
GFR calc non Af Amer: >60 mL/min (ref >60)

## 2010-12-10 ENCOUNTER — Encounter: Payer: Medicare Other | Admitting: Physical Therapy

## 2010-12-10 ENCOUNTER — Ambulatory Visit: Payer: Medicare Other | Attending: Neurosurgery | Admitting: Physical Therapy

## 2010-12-10 DIAGNOSIS — M545 Low back pain, unspecified: Secondary | ICD-10-CM | POA: Insufficient documentation

## 2010-12-10 DIAGNOSIS — IMO0001 Reserved for inherently not codable concepts without codable children: Secondary | ICD-10-CM | POA: Insufficient documentation

## 2010-12-10 DIAGNOSIS — M546 Pain in thoracic spine: Secondary | ICD-10-CM | POA: Insufficient documentation

## 2010-12-15 ENCOUNTER — Encounter: Payer: Medicare Other | Admitting: Physical Therapy

## 2010-12-15 ENCOUNTER — Ambulatory Visit: Payer: Medicare Other | Admitting: Physical Therapy

## 2010-12-16 ENCOUNTER — Telehealth: Payer: Self-pay | Admitting: Internal Medicine

## 2010-12-16 ENCOUNTER — Other Ambulatory Visit: Payer: Self-pay | Admitting: Internal Medicine

## 2010-12-16 DIAGNOSIS — K7460 Unspecified cirrhosis of liver: Secondary | ICD-10-CM

## 2010-12-16 NOTE — Telephone Encounter (Signed)
Pt received letter to call and set up follow up ultrasound, this has been scheduled for 05/03 @ 8:00. I left a message with details.

## 2010-12-17 ENCOUNTER — Encounter: Payer: Medicare Other | Admitting: Physical Therapy

## 2010-12-22 HISTORY — PX: CARPAL TUNNEL RELEASE: SHX101

## 2010-12-24 ENCOUNTER — Ambulatory Visit (HOSPITAL_COMMUNITY)
Admission: RE | Admit: 2010-12-24 | Discharge: 2010-12-24 | Disposition: A | Discharge status: Home / Self Care | Payer: Medicare Other | Source: Ambulatory Visit | Attending: Internal Medicine | Admitting: Internal Medicine

## 2010-12-24 DIAGNOSIS — K7460 Unspecified cirrhosis of liver: Secondary | ICD-10-CM | POA: Insufficient documentation

## 2010-12-25 ENCOUNTER — Ambulatory Visit: Payer: Medicare Other | Admitting: Physical Therapy

## 2010-12-30 ENCOUNTER — Ambulatory Visit: Payer: Medicare Other | Attending: Neurosurgery | Admitting: Physical Therapy

## 2010-12-30 DIAGNOSIS — M546 Pain in thoracic spine: Secondary | ICD-10-CM | POA: Insufficient documentation

## 2010-12-30 DIAGNOSIS — M545 Low back pain, unspecified: Secondary | ICD-10-CM | POA: Insufficient documentation

## 2010-12-30 DIAGNOSIS — IMO0001 Reserved for inherently not codable concepts without codable children: Secondary | ICD-10-CM | POA: Insufficient documentation

## 2011-01-05 NOTE — Assessment & Plan Note (Signed)
NAME:  Sabrina Wyatt, Sabrina Wyatt               CHART#:  [id number]   DATE:  05/24/2008                       DOB:  1950/12/11   PRIMARY CARE PHYSICIAN:  The patient denies any.   CHIEF COMPLAINT:  Followup GERD, chronic abdominal wall pain, and HCV  cirrhosis.   PROBLEM LIST:  1. 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 Chronic gastroesophageal reflux disease, well controlled on proton      pump inhibitor.  3. Hepatitis C virus cirrhosis.  4. Pulmonary nodules.  5. Cholecystectomy in 2002.  6. Biliary drain in 2002 with possible choledochojejunostomy in 2003.  7. Depression.  8. Posttraumatic stress disorder.  9. Chronic low back pain.  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  11.History of polysubstance abuse.   SUBJECTIVE:  The patient is a 60 year old female.  She has a history of  chronic GERD is well controlled on omeprazole 20 mg daily.  Overall, she  tells me she is doing very well.  Her abdominal wall pain is controlled  with Lidoderm patches.  She has previously seen a pain management  doctor, but did not feel that this nor physical therapy was helpful for  her abdominal pain.  Overall, she is doing about the same.  She had a  recent AFP, which was negative.  She had an abdominal ultrasound, which  showed hepatic cirrhosis, but no focal liver mass or abnormalities.  Her  weight has remained stable.  Her appetite is good.  She denies any  rectal bleeding or melena.   CURRENT MEDICATIONS:  See the list from May 24, 2008.   ALLERGIES:  No known drug allergies.   OBJECTIVE:  VITAL SIGNS:  Weight 137 pounds, height 65 inches,  temperature 98 degrees, blood pressure 120/80, and pulse 64.  GENERAL:  She is a well-developed and well-nourished Caucasian female,  in no acute distress.  HEENT:  Sclerae clear and nonicteric.  Conjunctivae pink.  Oropharynx  pink and moist without any lesions.  CHEST:   Heart regular rate and rhythm.  Normal S1 and S2 without  murmurs, clicks, rubs, or gallops.  ABDOMEN:  Positive bowel sounds x4.  No bruits auscultated.  Soft,  nontender, and nondistended without palpable mass or hepatosplenomegaly.  No rebound, tenderness, or guarding.  EXTREMITIES:  Without clubbing or edema.   ASSESSMENT:  1. The patient is a 60 year old Caucasian female with a history of      chronic abdominal wall pain and adhesive disease well controlled      with Lidoderm.  2. Chronic gastroesophageal reflux disease, well controlled on proton      pump inhibitor.  3. Hepatitis C virus cirrhosis, stable.   PLAN:  1. Lidoderm 5% patch up to 3 for 12 hours transdermally daily, #90      with 5 refills.  2. She is going to need to find a primary care physician to manage her      care and I have given her the name of several in the area today.  3. Omeprazole 20 mg daily, #30 with 11 refills.  4. In 6 months, she is going to need CBC, LFTs, AFP, PT, INR, and      abdominal ultrasound for screening for HCC.  Lorenza Burton, N.P.  Electronically Signed     R. Roetta Sessions, M.D.  Electronically Signed    KJ/MEDQ  D:  05/24/2008  T:  05/24/2008  Job:  [id number]

## 2011-01-05 NOTE — Assessment & Plan Note (Signed)
Sabrina Wyatt, Sabrina Wyatt               CHART#:  [id number]   DATE:  11/24/2007                       DOB:  [birth year]   Follow up chronic abdominal pain, abdominal wall pain, pulmonary  nodules, and cirrhosis secondary to HCB eradicated with antiviral  therapy, last seen here 05/19/2007.  Overall, she is doing well.  She  has chronic abdominal wall pain.  She has been seen by Dr. Evangeline Dakin over  at [facility] and he has also evaluated this lady and feels that most  likely her abdominal wall is chronic related to abdominal wall pain and  adhesions.  She has seen Dr. Kellie Simmering in pain management rheumatology  down in [place] and she is followed primarily by [facility].  She is going to get physical therapy in her abdominal  wall.  All of her labs and everything turned out okay at [facility].  They  recommended no further evaluation.  Reflux symptoms are well controlled  on omeprazole 20 mg orally daily.  She has stable pulmonary nodules on a  February CT.  Dr. Tyron Russell recommended one more chest CT in 2010.  She just  finished her hepatitis A immunization series.  She is getting physical  therapy to her abdominal wall and Lidoderm patches.   CURRENT MEDICATIONS:  See updated list.   ALLERGIES:  No known drug allergies.   PHYSICAL EXAMINATION:  GENERAL:  She appears at her baseline.  There is  no jaundice.  VITAL SIGNS:  Weight 135, height 5 feet 5 inches, temp 98.1, BP 134/90,  pulse 84.  CHEST:  Lungs are clear to auscultation.  HEART:  Regular rate and rhythm without murmur or gallop.  ABDOMEN:  A generous S-shaped curved scar on her right upper quadrant.  Positive bowel sounds.  She has some tenderness along the superior  aspect midway this scar.  No appreciable mass or organomegaly.  She  really does not have any rebound tenderness.   ASSESSMENT:  1. Chronic abdominal wall pain/abdominal pain possibly related to      adhesions from complicated gallbladder surgery.   Overall, she is      doing well and I see nothing else to do at this point but encourage      her to follow up with Dr. Kellie Simmering for pain management, etcetera.      She has asked for a refill of her Lidoderm patches which I did      refill for one month only.  2. Reflux symptoms are well controlled on omeprazole 20 mg daily.  She      is to continue that      regimen.  We will check a serum alpha fetoprotein and hepatic      ultrasound in 6 months.  Follow up with [place] after that.       Jonathon Bellows, M.D.  Electronically Signed     RMR/MEDQ  D:  11/24/2007  T:  11/24/2007  Job:  [id number]   cc:   R. Roetta Sessions, M.D.  Aundra Dubin, M.D.

## 2011-01-05 NOTE — Assessment & Plan Note (Signed)
NAMEMarland Wyatt  EARNESTEEN, BIRNIE               CHART#:  [id number]   DATE:  05/19/2007                       DOB:  Jan 02, 1951   CHIEF COMPLAINT:  Office visit for refill of Prevacid.   SUBJECTIVE:  The patient is here for a followup visit.  She was asked to  be seen prior to any further samples provided for Prevacid.  We last saw  her in June of 2008.  She has a history of chronic abdominal discomfort  and bloating.  She has chronic GERD.  At that office visit, it was felt  that she was doing fairly well.  She had had intussusception seen on a  CT back in January of 2008; however, a followup CT on February 15, 2007, did  not show any persistent intussusception.  She did have a cirrhotic liver  with evidence of a prior biliary surgery.  She also had four, scattered,  nonspecific, tiny, pulmonary nodules and is going to be having followup  CT of that as recommended.  She states as long as she takes her Prevacid  once daily she has no GERD symptoms and documented that we have provided  her multiple samples since her last office visit.  She received #28 on  July 17th and #20 on September 2nd.  She states that Medicaid covers her  Prevacid, but she is unable to pay the $3 co-pay at this time.  She also  asks that we refill her Flonase that she takes for allergies.  She  reports she is unable to pay the co-pay to go see her primary care  doctor for this.  She questions whether she can see a surgeon for  consideration of surgery for lysis of adhesions.  She believes this is  why she has chronic postprandial abdominal bloating and discomfort.  She  really is not having any nausea or vomiting or other symptoms suggestive  of obstruction.  She complains of chronic pain around her abdominal  incisions.   CURRENT MEDICATIONS:  See updated list.   ALLERGIES:  No known drug allergies.   PHYSICAL EXAMINATION:  VITAL SIGNS:  Weight 146 and stable, temp 98.7,  blood pressure 130/82, pulse 88.  GENERAL:  A  pleasant, well-developed, well-nourished, Caucasian female  in no acute distress.  SKIN:  Warm and dry, no jaundice.  HEENT:  Sclerae are nonicteric, oropharyngeal mucosa moist and pink.  ABDOMEN:  Full.  Well-healed surgical scars.  Positive bowel sounds.  Abdomen is nontender.  No organomegaly or masses.  EXTREMITIES:  No edema.   IMPRESSION:  1. A history of complicated gastrointestinal history as previously      outlined.  2. A history of bile duct injury, status post Roux-en-Y      hepatojejunostomy.  3. 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  Prior history      of hepatitis C eradicated with antiviral therapy.  She has been      recommended to have hepatitis A vaccination as she is not immune.  4. She continues to have chronic abdominal bloating and discomfort,      which is stable.  This potentially could be due to adhesive disease      versus functional.  I really did not get any impression that she is      having any significant pain and no vomiting.  She asked to see a      surgeon regarding a possible exploratory laparoscopy and lysis of      adhesions.  5. Gastroesophageal reflux disease is well-controlled as long as she      takes medications.   PLAN:  1. Flonase two sprays each nostril daily as needed, a 120 metered      spray bottle provided with one refill.  2. Prevacid 30 mg daily, #30, with 11 refills.  3. Five Prevacid samples provided.  4. Will discuss further with Dr. Jena Gauss regarding any need for surgical      opinion.       Tana Coast, P.A.  Electronically Signed     R. Roetta Sessions, M.D.  Electronically Signed    LL/MEDQ  D:  05/19/2007  T:  05/19/2007  Job:  [id number]

## 2011-01-05 NOTE — Assessment & Plan Note (Signed)
NAMEMarland Kitchen  Sabrina Wyatt, Sabrina Wyatt               CHART#:  [id number]   DATE:  02/10/2007                       DOB:  September 02, 1950   OFFICE FOLLOWUP   Followup of chronic abdominal discomfort/bloating.  History of hepatitis  C eradicated with antiviral therapy.  History of bile duct injury status  post roux-en Y hepato jejunostomy.  She has known bridging fibrosis  cirrhosis on prior biopsy.  Gastroesophageal reflux disease.   Intussusception seen on recent CT of uncertain significance.  Last seen  07/26/2006.  She does not have hepatitis A IgG has not been done,  hepatitis B surface antibody is positive.  Hepatitis A total antibody  has not been done.  Intussusception seen on 09/01/2006 CT.  Dr. Alver Fisher  recommended repeating in 3 months.  She has not had this done.  Apparently, she was seeing Dr. Wynn Banker at the pain clinic, but he has  discharged her from the practice because she missed too many  appointments.  She has not lost any weight.  She has chronic nausea and  bloating.  Moving her bowels fairly regularly.  Not having any melena or  rectal bleeding.  She is not vomiting.  She takes Prevacid SoluTab 30 mg  daily and is on a course of methylprednisolone for her back through  [facility].  She also takes Cymbalta 60 mg  daily.   ALLERGIES:  No known drug allergies.   EXAM:  She appears at her baseline.  Weight 145, height 5 feet 5 inches, temperature 98.5, BP 142/92, pulse  72.  SKIN:  Warm and dry.  ABDOMEN:  Full.  Well-healed surgical scars.  Positive bowel sounds.  Soft and nontender.  No appreciable mass or organomegaly.  EXTREMITIES:  No edema.   ASSESSMENT:  All in all, the patient is doing well from a  gastrointestinal standpoint.  She has had a complicated gastrointestinal  history over the years.  We need to determine her hepatitis A total  antibody status to see if she needs vaccination.  Will repeat abdominal  and pelvic CT.  I doubt the  intussusception has anything to do with her  symptoms, but if it is persistent, I will get a surgical opinion.  She  also needs to have a followup chest CT to look at the small lesion on  her  liver seen on abdominal CT previously.  Continue Prevacid 30 mg orally  daily.  Telephone followup.       Jonathon Bellows, M.D.  Electronically Signed     RMR/MEDQ  D:  02/10/2007  T:  02/10/2007  Job:  [id number]   cc:   [facility]

## 2011-01-05 NOTE — Discharge Summary (Signed)
[name], Sabrina NO.:  [id number]   MEDICAL RECORD NO.:  [id number]          PATIENT TYPE:  IPS   LOCATION:  0503                          FACILITY:  [facility]   PHYSICIAN:  Geoffery Lyons, M.D.      DATE OF BIRTH:  05/23/1951   DATE OF ADMISSION:  08/28/2007  DATE OF DISCHARGE:  08/31/2007                               DISCHARGE SUMMARY   CHIEF COMPLAINT AND PRESENT ILLNESS:  This was the first admission to  [facility] for this 60 year old single white female  voluntarily admitted.  Presented intoxicated to her psychotherapist's  office.  Requested help getting off the alcohol.  Has been drinking in a  binge pattern since about 2005, staying sober between 1-3 months at a  time.  In September 2008, her drinking had escalated, bingeing at least  once a week, drinking up to half a gallon at any one time.  Alcohol  level at the ED was 304.  Feels like she is unable to stop drinking.  History of abstinence for 10 years following a 28-day program in 1990.   PAST PSYCHIATRIC HISTORY:  Currently followed by Dr. Mammie Russian at  [facility].  This is the first time at  [facility].   ALCOHOL AND DRUG HISTORY:  As already stated, persistent use of alcohol.  She has a current DWI incurred in September 2008, with a court date  September 06, 2007.  Started drinking at age 61.  Some marijuana since age  36.   MEDICAL HISTORY:  1. Chronic right upper quadrant pain secondary to adhesions following      cholecystectomy in 2002, with multiple complications.  2. Hepatitis C.  3. Carpal tunnel repair.   MEDICATIONS:  1. Cymbalta 60 mg per day.  2. Prevacid 30 mg per day.   PHYSICAL EXAMINATION:  Performed, failed to show any acute findings.   LABORATORY WORK:  CBC:  White blood cells 9.6, hemoglobin 15.9,  hematocrit 46.7, mean corpuscular volume 94.4.  Sodium 145, potassium  3.6, BUN 9, creatinine 0.7, glucose 96.  Alcohol level  304.   MENTAL STATUS EXAM:  Reveals a fully alert female, pleasant,  cooperative.  Speech was normal in pace, tone, and production.  Mood is  anxiety.  Affect is anxiety.  Does have a sense of humor.  Gives  coherent history.  Thought processes logical, coherent, and relevant.  Insightful in terms of her situation at home and her relapse triggers  and went into something about it.  No active suicidal or homicidal  ideations.  No hallucinations.  Cognition well preserved.   ADMITTING DIAGNOSES:   AXIS I:  Alcohol dependence.  Depressive disorder, not otherwise specified.   AXIS II:  No diagnosis.   AXIS III:  Chronic chronic right upper quadrant pain secondary to  adhesions.  Elevated blood pressure.   AXIS IV:  Moderate.   AXIS V:  Upon admission 40, highest global assessment of function in the  last year 70.   COURSE IN THE HOSPITAL:  She was admitted, started on individual and  group  psychotherapy.  She was detoxified with Librium.  She was  maintained on Cymbalta and the Protonix.  As already stated, she had  been binge drinking.  On the third day of her drinking binge, she saw  her counselor, who recommended inpatient.  In June 2002, had a surgery  that when wrong, gallbladder, with multiple complications.  She was  placed on opiates for 17 months.  She was weaned off opiates in 2003,  but has been drinking, as already stated.  Living with a roommate from  [place].  This is her second DUI.  On August 30, 2007, she continued to  work on herself.  More insightful in terms of what triggered her  relapse.  She was going to go back to her place.  Has already  communicated to the roommate that she is not going to be willing to put  up with her use of substances.  If she was to use, she was going to ask  her to go back to [place].  Endorsed she was really committed to make  this work for her.  Tired of drinking and of being sick.  There was a  phone session with her roommate.   She was pretty clear in what the  expectations were going to be.  She felt good in terms of the outcome of  the conversation.  On August 31, 2007, she was in full contact with  reality.  There were no active suicidal or homicidal ideas, no  hallucinations or delusions.  No active withdrawal.  She was going to go  back to the house, having defined clearly the expectations for the  housemate.   DISCHARGE DIAGNOSES:   AXIS I:  Alcohol dependence.  Depressive disorder, not otherwise specified.   AXIS II:  No diagnosis.   AXIS III:  Chronic right upper quadrant pain secondary to adhesions.   AXIS IV:  Moderate.   AXIS V:  Upon discharge, 55-60.   Discharged on:  1. Cymbalta 60 mg per day.  2. Prevacid 30 mg per day.  3. Librium 25, 1 at 5 p.m. on August 31, 2007, then Librium 25, 1 in      the morning on September 01, 2007, then discontinue.   FOLLOWUP:  Wilber Bihari and Mariel Craft, [facility].     Geoffery Lyons, M.D.  Electronically Signed    IL/MEDQ  D:  09/17/2007  T:  09/18/2007  Job:  [id number]

## 2011-01-05 NOTE — H&P (Signed)
Sabrina Wyatt, Sabrina Wyatt              ACCOUNT NO.:  [id number]   MEDICAL RECORD NO.:  [id number]          PATIENT TYPE:  IPS   LOCATION:  0503                          FACILITY:  [facility]   PHYSICIAN:  Geoffery Lyons, M.D.      DATE OF BIRTH:  [birth year]   DATE OF ADMISSION:  08/28/2007  DATE OF DISCHARGE:                       PSYCHIATRIC ADMISSION ASSESSMENT   IDENTIFYING INFORMATION:  A 60 year old single white female.  This is a  voluntary admission.   HISTORY OF PRESENT ILLNESS:  This patient presented intoxicated  yesterday to her psychotherapist's office and requested help getting off  of alcohol.  She says she has been drinking in binge pattern since about  2005, staying sober between 1 and 3 months at a time, but since  September 2008, her drinking has escalated, now binging at least once a  week and drinking up to half a gallon at anyone time.  Her alcohol level  in the emergency room was 304 mg%.  She feels unable to stop drinking on  her own; has a history of abstinence in the past for 10 years following  a 28-day program in 1990 and most recently was abstinent close to 3  years until around 2005 or 2006 when she began drinking again.  She has  a history of depression and is compliant with her Cymbalta 60 mg daily.  She denies any active suicidal thoughts.   PAST PSYCHIATRIC HISTORY:  The patient is currently followed by Dr.  Duayne Cal at [facility] and Wilber Bihari also there who  is her psychotherapist.  First admission to [facility].  She has a history of one prior admission to a 28-day treatment  program in [place] in the 90s and history of significant abstinence  as noted above.  She originally began drinking alcohol at age 78 and  some use of marijuana since age 78.  She does have a current DWI  incurred in September 2008 with a court date scheduled for September 06, 2007.   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   Originally from  [place]; moved down here a few years back to be near her daughter who  was going to school in [place], [place].  Single, currently  living with a roommate friend from [place] who also drinks.  The  patient endorses some difficulties remaining abstinent with a roommate  who also drinks.  She is of the [employer]; denying financial  stressors.  She is close to her daughter who is her most supportive  person, and she reports this is an asset to her abstinence.   FAMILY HISTORY:  Remarkable for sister with history of eating disorder.   ALCOHOL AND DRUG HISTORY:  Noted above.   MEDICAL HISTORY:  The patient is followed by Dr. Celene Skeen at [facility] and Belva Agee a nurse practitioner.   MEDICAL PROBLEMS:  Chronic right upper quadrant pain secondary to  adhesions following a cholecystectomy in 2002 with multiple  complications and surgical revisions.   PAST MEDICAL HISTORY:  Remarkable  for hepatitis C and carpal tunnel  repair.   MEDICATIONS:  Cymbalta 60 mg daily, Prevacid 30 mg daily.   DRUG ALLERGIES:  NONE.   POSITIVE PHYSICAL FINDINGS:  GENERAL:  Small-built white female in no  acute distress.  Slightly disheveled, does appear to be in withdrawal.  VITAL SIGNS:  Five feet 4 inches tall, 138 pounds.  Temp 98.4, pulse  103, respirations 16, blood pressure 175/102 on initial presentation.  Blood pressure is down today to a pulse of 84 with blood pressure  157/97.  NEUROLOGIC:  Today, most notably, no tremor.  Her motor appears normal.  ABDOMEN:  She does have a 10 inches right upper quadrant scar that is  well healed, appears to be a surgical.   DIAGNOSTIC STUDIES DONE IN THE EMERGENCY ROOM:  CBC:  WBC 9.6,  hemoglobin 15.9, hematocrit 46.7, and platelets 182,000, MCV is 94.4.  Chemistry:  Sodium 145, potassium 3.6, chloride 111, carbon dioxide 30,  BUN 9, creatinine 0.7 and random glucose 96.  Alcohol level 304.  Liver   enzymes and TSH are currently pending.   MENTAL STATUS EXAM:  Fully alert female, pleasant, cooperative with  essentially normal motor exam.  No nystagmus.  Gait is steady.  Speech  is normal in pace, tone and production.  Affect is blunted but  appropriate.  She does have a sense of humor.  Gives a coherent history.  Mood is depressed.  Thought process is logical, coherent.  No active  suicidal thoughts today, denying any dangerous thoughts of suicide or  homicide.  Cognition is completely intact.  She has been thinking  through her situation at home and her relapse triggers and feels that  having a family session and discussing the issue with her roommate is  going to be essential to her maintaining abstinence; has expressed that  she is willing to ask her roommate to move out if necessary to maintain  her abstinence.  No evidence of psychosis.  She is fully oriented x4.   AXIS I:  EtOH abuse and dependence, depressive disorder NOS.  AXIS II:  No diagnosis.  AXIS III:  1. Chronic right upper quadrant pain secondary to adhesions status      post cholecystectomy with complications in 2002.  2. Elevated blood pressure, rule out hypertension.  AXIS IV:  Moderate-to-severe chronic social issues.  AXIS V:  Current 40, past year 20.   PLAN:  Voluntarily admit the patient with a goal of a safe detox in 5  days.  We are going to continue her Cymbalta at the current dose of 60  mg daily, and I have started her on a Librium protocol to detox her from  the alcohol.  I have explained the medications and she voiced her  understanding.  Liver enzymes and TSH are currently pending.  We are  going to attempt to arrange a family session with her roommate to  discuss the situation in the home that is a stressor.  She has also  requested influenza vaccine which she will be given today.  Estimated  length of stay is 5-6 days.      Margaret A. Scott, N.P.      Geoffery Lyons, M.D.  Electronically  Signed    MAS/MEDQ  D:  08/29/2007  T:  08/29/2007  Job:  [id number]

## 2011-01-06 ENCOUNTER — Ambulatory Visit: Payer: Medicare Other | Admitting: Physical Therapy

## 2011-01-07 ENCOUNTER — Ambulatory Visit: Payer: Medicare Other | Admitting: Physical Therapy

## 2011-01-08 NOTE — Assessment & Plan Note (Signed)
Sabrina Wyatt returns today.  She has a history of abdominal scar paraesthesia  which has responded nicely to scar desensitization, history of right-sided  abdominal pain, history of gallbladder disease, bile duct injury requiring  reconstruction.   She is managing overall well in terms of her abdominal pain, although needs  some assistance with household duties and shopping.  She sees a psychiatrist  for depression, anxiety and suicidal thoughts, no plans.   The only medication I have her on is Flexeril 10 q.h.s.  She continues with  her home exercise program given to her by physical therapy.   Today's main complaint is right upper extremity numbness, mainly at night  but somewhat during the day.  Has not tried a wrist splint yet.  Has not had  a history of carpal tunnel.  Does not have any significant neck pain.   PHYSICAL EXAMINATION:  Phalen's is positive, right greater than left.  Tinel's is negative.  Motor strength is 5/5 bilateral extremity.  Neck has  good range of motion.  Abdomen has no tenderness to palpation.   IMPRESSION:  1.  Chronic abdominal pain, myofascial with scar paresthesias.  I think she      is relatively stable from this standpoint.  2.  Right upper extremity dysesthesias:  Rule out carpal tunnel.  We will      set her up for an EMG and CVs next month.  We will continue her Flexeril      10 q.h.s.  We will hold off on any formalized physical therapy program.      She can continue her home exercises.      Erick Colace, M.D.  Electronically Signed     AEK/MedQ  D:  02/10/2006 17:17:33  T:  02/11/2006 00:26:39  Job #:  [id number]

## 2011-01-08 NOTE — Assessment & Plan Note (Signed)
HISTORY OF PRESENT ILLNESS:  Ms. Shankles returns today. She is making general  improvement in terms of her chronic abdominal pain. She is not on any pain  medications. She has had scar desensitization and started some abdominal  core strengthening exercises when her prescription ran out. 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   SOCIAL HISTORY:  Lives with her daughter, smokes, single.   INTERVAL MEDICAL HISTORY:  Colonoscopy and EGD per Dr. Jena Gauss for revealing  hiatal hernia.   REVIEW OF SYSTEMS:  Anxiety, depression. No heartburn now on Prevacid.   PHYSICAL EXAMINATION:  Blood pressure 144/92, pulse 90, O2 saturations 97%  on room air. Gait is normal. Affect is alert, appearance is normal. This  exam was chaperoned by Darlen Round, RN. Abdomen has minimal tenderness  to palpation over the abdominal scar. She is able to single as well as  bilateral straight leg raises. Able to do partial sit up. The double leg  lift does cause abdominal pain.   Her back and neck as well as buttock area and hip area have no tenderness to  palpation. She has full range of motion of the lumbar spine.   Current pain level 5/10, pain ranges 3 to 6. Pain interference scores are:  General activity 7, mood 7, walking ability 7, normal work 7, relationship  with other people 8, sleep 8, enjoyment of life 8.   IMPRESSION:  Chronic abdominal pain, right upper quadrant, myofascial as  well as scar sensitization causing paresthesias and dysesthesias.   PLAN:  Continue outpatient physical therapy to concentrate on abdominal  strengthening as well as some additional scar _________ desensitization. I  will see her back in approximately two months and at that point, I think she  will likely be able to graduate to a home exercise program.   Continue nortriptyline 10 q.h.s. Encouraged her to take this on a  regular  basis rather than on a p.r.n. basis.      Erick Colace, M.D.   AEK/MedQ  D:  09/19/2003 13:21:49  T:  09/19/2003 13:55:23  Job #:  [id number]   cc:   R. Roetta Sessions, M.D.  [address]  [place]  [place] [address]  Fax: [phone]
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
## 2011-01-08 NOTE — Assessment & Plan Note (Signed)
A 60 year old female last seen by me on November 09, 2005, for chronic right-  sided abdominal pain, history of gallbladder disease, cholecystectomy, bile  duct injury requiring reconstruction, cervical enteric decompression, fusion  related to cervical spondylosis. She has had back pain with large central  disk herniation at L4-5 with no radicular symptoms in the lower extremities.  She was returned to physical therapy which has really focused on her neck  pain and this is better. However, she feels her back and abdominal pain have  not been adequately addressed by physical therapy, and she would like to  have further treatment at this point.   We have re-started the Lidoderm patch at the abdominal scar area. She also  uses some to her neck area and she uses this mainly at night. She is using  Cymbalta under the direction of her psychiatrist.   Her average pain is 5 out of 10, currently 6. The pain increases with  general activity to 8 out of 10. Pain is rather constant and improves with  rest, therapy, exercise, and TENS unit. She can walk 15 minutes at a time.  She drives. She did not interfere at all with her activities except needing  some help with some heavier household duties.   REVIEW OF SYSTEMS:  Positive for numbness and tingling in the right hand,  confusion, and depression. She denies any night time pain in her hand. She  has had problems with chronic nausea and abdominal pain, poor appetite.   SOCIAL HISTORY:  She is single, lives with her daughter.   PHYSICAL EXAMINATION:  Her blood pressure is 128/78, pulse 79, respirations  17, O2 saturation 98% on room air. In general, in no acute distress. Mood  and affect appropriate. She is accompanied by a friend of hers who wishes to  stay in the room. She has no guarding in the abdomen. She has no  hypersensitivity of touch over the scar. However, with stretching over  abdominal area such as extension she gives a point of  sensation along her  right upper quadrant scar. No hypertrophic changes.   Her back has tenderness to palpation in the lumbar spine. She has good range  of motion, flexion and extension, and lower extremity strength is good.   IMPRESSION:  1.  Chronic abdominal pain, myofascial, with some mild scar paresthesia.  2.  Lumbar degenerative disk without radiculopathy.  3.  Cervical post laminectomy syndrome. No signs of focal compression. Upper      extremity strength is good and his upper extremity sensation.   PLAN:  1.  I will see her back in one month.  2.  Will start Flexeril 10 q.h.s.  3.  Continue PT for abdominal scar, myofascial release, desensitization as      well as a course stabilization program.      Erick Colace, M.D.  Electronically Signed     AEK/MedQ  D:  12/21/2005 13:07:03  T:  12/21/2005 16:10:96  Job #:  [id number]   cc:   Cristi Loron, M.D.  Fax: [phone]   Lawerance Sabal, MD  Fax: [phone]   R. Roetta Sessions, M.D.  [address]  [place]  [place] [address]   Dr[facility]

## 2011-01-08 NOTE — Consult Note (Signed)
NAME:  Sabrina Wyatt, Sabrina Wyatt                        ACCOUNT NO.:  [id number]   MEDICAL RECORD NO.:  [id number]                   PATIENT TYPE:  REC   LOCATION:  TPC                                  FACILITY:  [facility]   PHYSICIAN:  Zachary George, DO                      DATE OF BIRTH:  [birth year]   DATE OF CONSULTATION:  10/23/2002  DATE OF DISCHARGE:                                   CONSULTATION   The patient returns to clinic today for reevaluation.  She was initially  seen on 09/26/2002.  She continues to complain of abdominal pain and lower  back pain.  She is getting significant relief with Lidoderm 5%.  Her sleep  still seems to be affected, and she continues on Nortriptyline 10 mg 1 to 2  p.o. q.h.s. with intermittent improvement in her sleep.  She also takes  Vioxx 25 mg daily which seems to help as well.  Her pain is decreased today  to a 5/10 on a subjective scale from 7/10 previously.  Her function and  quality of life indices have improved somewhat.  She states she is getting  out more.  She follows up with mental health on 10/26/2002 for her mood  swings.  I reviewed the health and history form and 14-point Review of  Systems.  No new neurologic complaints.  She is moving her bowels.   PHYSICAL EXAMINATION:  GENERAL: Healthy female in no acute distress.  VITAL SIGNS:  Blood pressure 141/85, pulse 99, respirations 18, O2  saturation 97% on room air.  NEUROLOGIC:  Mood and affect are appropriate.   IMPRESSION:  1. Chronic abdominal wall pain status post multiple surgeries with scar     tissue.  2. Nonrestorative sleep disorder.  3. Depression.  4. Chronic low back pain, stable.   PLAN:  1. Discussed treatment options with the patient.  At this time, it is     reasonable to continue with current medication regimen to include Vioxx     25 mg 1 p.o. daily, #30 with 2 refills.  Will also continue Lidoderm 5%,     apply up to 12 hours to day with maximum 3 patches at a time, #60 with  2     refills.  2. Continue Nortriptyline 10 to 20 mg at bedtime.  3. Follow up with psychiatry.  4. The patient is to return to clinic in three months for reevaluation.   The patient was educated about findings and recommendations and understands.  No barriers to communication.                                               Zachary George, DO    JW/MEDQ  D:  10/23/2002  T:  10/23/2002  Job:  [id number]   cc:   Charlesetta Shanks, M.D.  [address]  [place], [place] [address]

## 2011-01-08 NOTE — Assessment & Plan Note (Signed)
FOLLOW UP VISIT:  Carpal tunnel plus chronic abdominal pain.  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  She had good  results with Medrol Dose-Pak.  Does not desire injection or surgery.  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  Continues to work and self  employed.   Chronic abdominal pain has a malpractice case going on this from a  physician in [place].  She walks 20-25 minutes at a time, climb steps  and she drives.  Pain level is 7 /10.   She has some neck pain and back pain.  She uses Lidoderm patch for  these.   REVIEW OF SYSTEMS:  Confusion, depression and anxiety, nausea, vomiting  and abdominal pain.   PHYSICAL EXAMINATION:  ABDOMEN:  Positive bowel sounds, soft and  nontender to palpation.  Incision is nontender.  Abdomen is mildly  distended.  She is able to do single and double straight leg raises.  NEUROLOGIC:  Mood and affect appropriate.  Gait is normal.  EXTREMITIES:  Hands have positive Phalen's although not specifically in  the dermatome and bilateral upper extremity with reverse Phalen's.  She  has no pain with stress of carpal/metacarpal joint on the left side.   IMPRESSION:  1. Chronic abdominal pain.  Postoperative scar paresthesias is well-      controlled at this current time.  2. Carpal tunnel syndrome, not related to above.  3. Hepatitis C.  Recent CT abdomen showing cirrhotic liver, enlarged.   PLAN:  1. Continue current medications which include Cymbalta 30 mg a day,      Lidoderm patch to scar paresthesias, she uses 1 of those per day      for the scar and 1 for the back pain, and Medrol Dose-Pak for      carpal tunnel.  2. I will see her back in 3 months.  3. For sleep, we will use nortriptyline 10 mg q.h.s.      Erick Colace, M.D.  Electronically Signed     AEK/MedQ  D:  09/19/2006 09:44:01  T:  09/19/2006 10:45:09  Job #:  [id number]   cc:   R.  Roetta Sessions, M.D.  [address]  [place]  [place] [address]

## 2011-01-08 NOTE — Op Note (Signed)
NAME:  ZAYLIN, PISTILLI                        ACCOUNT NO.:  [id number]   MEDICAL RECORD NO.:  [id number]                   PATIENT TYPE:  INP   LOCATION:  3006                                 FACILITY:  [facility]   PHYSICIAN:  Cristi Loron, M.D.            DATE OF BIRTH:  Jun 28, 1951   DATE OF PROCEDURE:  02/10/2004  DATE OF DISCHARGE:                                 OPERATIVE REPORT   PREOPERATIVE DIAGNOSIS:  C5-6 spondylosis, degenerative disk disease,  stenosis, cervical radiculopathy, cervicalgia.   POSTOPERATIVE DIAGNOSIS:  C5-6 spondylosis, degenerative disk disease,  stenosis, cervical radiculopathy, cervicalgia.   OPERATION PERFORMED:  C5-6 extensive anterior cervical diskectomy, interbody  iliac crest allograft arthrodesis.  Anterior cervical plating (Codman Slim  Lock titanium plate and screws.   SURGEON:  Cristi Loron, M.D.   ASSISTANT:  Hewitt Shorts, M.D.   ANESTHESIA:  General endotracheal.   ESTIMATED BLOOD LOSS:  50 mL.   SPECIMENS:  None.   DRAINS:  None.   COMPLICATIONS:  None.   INDICATIONS FOR PROCEDURE:  The patient is a 60 year old white female who  has suffered from neck and arm pain.  She has failed medical management and  was worked up with a cervical MRI which demonstrated considerable  spondylosis and stenosis at C5-6.  I discussed the various treatment options  with the patient including surgery.  The patient has weighed the risks,  benefits and alternatives of surgery and decided to proceed with a C5-6  anterior cervical diskectomy, fusion and plating.   DESCRIPTION OF PROCEDURE:  The patient was brought to the operating room by  the anesthesia team.  General endotracheal anesthesia was induced.  The  patient remained in supine position.  A roll was placed under the shoulders  to place the neck in slight extension.  The anterior cervical region was  then prepared with Betadine scrub and Betadine solution.  Sterile drapes  were  applied.  I then injected the area to be incised with Marcaine with  epinephrine solution, used a scalpel to make a transverse incision in the  patient's left anterior neck.  I used Metzenbaum scissors to divide the  platysma muscle and then to dissect medial to the sternocleidomastoid  muscle, jugular vein and carotid artery.  I bluntly dissected down toward  the anterior cervical spine, carefully identifying the esophagus and  retracting it medially.  I then cleared the soft tissue from the anterior  cervical spine using Kitner swabs and then and inserted a bent spinal needle  into the upper exposed interspace.  We obtained intraoperative radiograph to  confirm our location.   We then used electrocautery to detach the medial border of the longus colli  muscle bilaterally from C5-6 intervertebral disk space.  We then inserted a  Caspar self-retaining retractor for exposure and then incised the C5-6  intervertebral disk with a 15 blade scalpel.  We performed a partial  diskectomy  using pituitary forceps and the Carlens curet.  We inserted  distraction screws at C5 and C6, distracted the interspace and then used a  high speed drill to decorticate vertebral end plates at W2-9, drill away the  remainder of  the C5-6 intervertebral disk and thinned out the posterior  longitudinal ligament and then to drill away some posterior spondylosis.  We  incised the thinned out ligament with the arachnoid knife and then removed  it with the Kerrison punch undercutting the vertebral end plates at F6-2,  decompressing the thecal sac.  We then performed a generous foraminotomy  about the bilateral C6 nerve root completing the decompression.   We now turned our attention to arthrodesis.  We obtained iliac crest  tricortical allograft bone graft and fashioned it to these approximate  dimensions.  7 mm in height, 1 cm in depth.  We inserted the bone graft into  the distracted interspace and then removed the  distraction screws.  There  was a good snug fit of the bone graft.   We now turned our attention to the anterior spinal instrumentation.  We  drilled away some ventral spondylosis so that the plate would lie down flat.  We then selected the appropriate length Codman Slim lock anterior cervical  plate.  We laid it along the anterior aspect of the vertebral bodies at C5  and C6.  We drilled two 12 mm holes at C5, two at C6.  We then secured the  plates to the vertebral bodies by placing two 12 mm screws at C5 an two at  C6.  We obtained an intraoperative radiograph.  It demonstrated good  position of the plate, screws and interbody graft.  We then secured the  screws and plate by locking each cam.  We then obtained stringent hemostasis  using bipolar electrocautery.  We copiously irrigated the wound out with  bacitracin solution, removed the solution and then removed the Caspar self-  retaining retractor.  We inspected the esophagus for any damage.  There was  none apparent.  We then reapproximated the patient's platysma muscle with  interrupted 3-0 Vicryl sutures, subcutaneous tissues with interrupted 3-0  Vicryl suture and the skin with benzoin and Steri-Strips.  The wound was  then coated with bacitracin ointment.  A dry sterile dressing was applied.  The drapes were removed.  The patient was subsequently extubated by the  anesthesia team and transported to the post anesthesia care unit in stable  condition.  All sponge, needle and instrument counts were correct at the end  of this case.                                               Cristi Loron, M.D.    JDJ/MEDQ  D:  02/10/2004  T:  02/11/2004  Job:  [id number]

## 2011-01-08 NOTE — H&P (Signed)
NAME:  Sabrina Wyatt, Sabrina Wyatt                        ACCOUNT NO.:  [id number]   MEDICAL RECORD NO.:  [id number]                  PATIENT TYPE:   LOCATION:                                       FACILITY:   PHYSICIAN:  R. Roetta Sessions, M.D.              DATE OF BIRTH:  August 18, 1951   DATE OF ADMISSION:  09/02/2003  DATE OF DISCHARGE:                                HISTORY & PHYSICAL   CHIEF COMPLAINT:  1. Need for colorectal cancer screening.  2. Gastrointestinal reflux disease.   HISTORY OF PRESENT ILLNESS:  [name] is a 60 year old Caucasian  female with a most complicated recent GI history, who now complains of a  several-month history of gastrointestinal reflux symptoms in spite of taking  Protonix 40 mg orally daily.  Protonix previously controlled her symptoms  fairly well.  Not having any odynophagia or dysphagia.  She has not lost any  weight.  She has never had her upper GI tract imaged.  She is 60 years of  age and has never had a colonoscopy.  She is devoid of any lower GI tract  symptoms.  She does have nausea without vomiting and vague right upper  quadrant discomfort.  She describes a bad taste in her mouth, as well as  heartburn symptoms.  This lady's past GI history as it pertains to  complications following cholecystectomy are outlined below.   She has gained 5 pounds since her July 11, 2003, office visit.  She is  in the process of going to physical therapy three times weekly.  She also  sees the pain clinic physicians down in [place], [place].  She is  also followed by mental health and has been treated for depression.   PAST MEDICAL HISTORY:  Significant for:  1. Laparoscopic cholecystectomy back in June of 2002 for symptomatic     cholelithiasis in [place].  Postoperative course complicated by     obstructing jaundice.  She had common bile duct transection.  She     underwent a laparotomy with placement of a T tube and with open surgical   repair of her common bile duct.  She had a persisting leak involving the     right hepatic systemic requiring prolonged percutaneous drainage.  The     drains were eventually removed.  In the spring of 2003, she presented     with obstructing jaundice again.  She required placement of a     percutaneous stent and ultimately underwent another laparotomy with a     Roux-En-Y hepaticojejunostomy.  2. History of hepatitis C and is status post antiviral therapy.  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  Subsequent     followup hepatitis C RNA by the PCR assay came back negative.   CURRENT MEDICATIONS:  1. Zoloft 50 mg t.i.d.  2. Protonix 40 mg daily.  3. Trazodone  325 mg tablets at bedtime.  4. Milk thistle capsule once daily.   Previously taking Vioxx, but discontinued back in July of 2004.   ALLERGIES:  No known drug allergies.   FAMILY HISTORY:  Negative for chronic GI or liver disease.  Specifically  negative for colorectal neoplasia.   SOCIAL HISTORY:  The patient is single.  She has one daughter.  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  She moved down to [place] from [place] so she could be closer to [facility] where she  would be close to her daughter.  She smokes half of a packs of cigarettes  per day.  No alcohol.  Prior history of illicit drug use, but none now.   REVIEW OF SYSTEMS:  No chest pain.  No dyspnea on exertion.  She does  complain of chronic fatigue and malaise.   LABORATORY DATA:  Laboratory evaluation from August 14, 2003:  Chem-20  okay, except alkaline phosphatase slightly elevated at 94, GOT 20, GPT 221,  and total bilirubin 0.6.  White count 8.0, hemoglobin 14.3, hematocrit 41.8,  MCV 89.7.  The TSH was normal at 1.36 back in June of 2004.   PHYSICAL EXAMINATION:  GENERAL APPEARANCE:  A pleasant 60 year old lady  resting comfortably.  WEIGHT:  145 pounds.  VITAL SIGNS:  BP 120/80, pulse  82.  SKIN:  Warm and dry.  She has multiple tattoos.  She has nasal piercings.  No scleral icterus.  The conjunctivae are pink.  NECK:  JVD is not prominent.  CHEST:  Lungs are clear to auscultation.  CARDIOVASCULAR:  Regular rate and rhythm without murmur, rub, or gallop.  BREASTS:  Exam is deferred.  ABDOMEN:  She has well-healed extensive surgical scarring.  Positive bowel  sounds.  Soft.  Vague right upper quadrant tenderness.  No appreciable mass  or organomegaly.  I do not appreciate a hernia.  RECTAL:  Exam will be done at the time of colonoscopy.   IMPRESSION:  [name] is a 60 year old lady with a recent  complicated gastrointestinal history related to cholecystectomy and a  history of hepatitis C with eradication with antiviral therapy, who has  gastrointestinal reflux disease symptoms now becoming refractory with  breakthrough symptoms on Protonix 40 mg once daily.  She has nonspecific  symptoms of fatigue, malaise, and some right upper quadrant tenderness on  physical exam.  All in all I think she is doing well from a GI standpoint  aside from her reflux symptoms.   She has never had a colonoscopy.   RECOMMENDATIONS:  Proceed with an EGD to further evaluate her worsening  reflux symptoms.  Will also offer colonoscopy at the time for colorectal  cancer screening purposes.  The potential risks, benefits, and alternatives  have  been reviewed.  We specifically talked about the risk of perforation with  endoscopy.  Her questions were answered and she is agreeable.  We will plan  to perform EGD and colonoscopy in the near future.  Further recommendations  to follow.     ___________________________________________                                         Jonathon Bellows, M.D.   RMR/MEDQ  D:  09/02/2003  T:  09/02/2003  Job:  [id number]   cc:   Sabrina Wyatt  [phone]  Sabrina Wyatt [place]  [place] [address]  Fax: [phone]

## 2011-01-08 NOTE — Assessment & Plan Note (Signed)
The last visit was April 21, 2006.  It was a nerve conduction study  demonstrating bilateral carpal tunnel.  She has been using the wrist splints  at night which have been partially helpful, but she still has some pain  during the day.  She works with her hands as a self-employed Horticulturist, commercial  and states that her left hand is worse than her right.   She does have chronic abdominal pain.   REVIEW OF SYSTEMS:  Please see health and history form.   PHYSICAL EXAMINATION:  GENERAL:  No acute distress.  Mood and affect  appropriately.  Pain level 5-7/10.  Sleep is poor.  VITAL SIGNS:  Blood pressure 146/102, pulse 84, respiratory rate 15, O2  saturation 98% on room air.  ABDOMEN:  Mild distention.  She has no evidence of hypertrophic scar.  EXTREMITIES:  Her wrists have no tenderness to palpation.  She does have  pain with stress of the left first CMC joint.  She has reduced sensation and  paresthesias in the middle digits compared to the fifth digits bilaterally.   IMPRESSION:  1. Chronic abdominal pain, myofascial pain, with chronic paresthesias.      Continuing Flexeril for this.  However, will try the newer sustained-      release form given that she has daytime as well as nighttime symptoms,      15 mg of Axmid.  2. In terms of her carpal tunnel syndrome, will give her a Medrol Dosepak.  3. I will see her back in about 4-6 weeks.  If she is not much better by      then, consider carpal tunnel injection versus first CMC injection or      both.      Erick Colace, M.D.  Electronically Signed     AEK/MedQ  D:  06/20/2006 13:50:05  T:  06/21/2006 08:19:26  Job #:  [id number]

## 2011-01-08 NOTE — Assessment & Plan Note (Signed)
REASON FOR VISIT:  Reevaluation of chronic abdominal pain.  Evaluate right  upper extremity pain and back pain.   HISTORY OF PRESENT ILLNESS:  The patient is a 60 year old female who I last  evaluated October 31, 2003.  She has chronic right sided abdominal pain with a  history of gallbladder disease, cholecystectomy and had bile duct injury  requiring reconstruction.  She was on OxyContin for pain relief for a period  of time.  Was on Vioxx for a period of time.  She has had problems with  depression and has been treated with Zoloft for a number of years.  I first  saw her in August 2004.  We prescribed physical therapy.  She was off her  narcotic analgesics by that time and they were not restarted.  She had scar  desensitization at [facility] up in [place], [place].  She has  history of hiatal hernia per EGD per Dr. Jena Gauss in 2005.  She was started on  Nortriptyline 10 mg q.h.s. in January 2005 and she was transitioned to care  through her psychiatrist and primary care physician as well as GI physician  as of my last visit with her on October 31, 2003.  Since that time, she has  had onset neck pain and underwent neurosurgical evaluation with Dr. Delma Officer who performed a C5-6 ACDF infusion and plating around May or June of  2005.  She had additional follow up in January 2007, and had MRI of the  cervical spine showing both fused C5-6 segments, mild retrolisthesis C4-5,  compressing spinal cord in C6-7 left foramen.  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   She does not note any lower extremity problems.  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   REVIEW OF SYSTEMS:  Pain level was about 6/10.  She states her abdominal  pain was really the predominant pain that she feels, and this is  mainly with  changes of position.  For instance, if she was sitting for prolonged period  of time when she starts standing, this causes pain that subsides with  stretching.  Also, at nighttime when she goes from being active during the  day to the rest of the evening, she has increased abdominal pain.  Her sleep  is described as poor.  Functionally, she can climb steps, drive.  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  She states that she has some difficulty performing certain household duties.  Positive for confusion, depression, anxiety.  She had tingling and numbness  in the right hand and multiple GI symptoms.   SOCIAL HISTORY:  She lives with her daughter who is 47 years old.  Smokes  have pack a day.  Drinks a bottle of wine over the course of the weekend.  She has history of DUI.  She smokes marijuana.  She did have an ED visit on  March 30, 2005, with some suicidal ideation, but no intent.  She had  elevated ethanol level at that time, but negative UDS for illicit drugs.  Follow up mental help was needed.  She does see a psychiatrist.   She has malpractice litigation pending against the surgeon performing  cholecystectomy in [place] in 2002.   PHYSICAL EXAMINATION:  VITAL SIGNS:  Blood pressure 137/88, pulse 96,  respirations 16, O2 saturation 98% on room air.  GENERAL APPEARANCE:  This exam was chaperoned by LPN Nastasia.  Generally,  no acute distress.  Patient sitting comfortably on exam table.  She is able  to ambulate and do toe walk and heel walk without difficulty.  NECK:  Mildly decreased side to side bending as well as extension, but has  full flexion of the neck.  She has mildly decreased right C5-6 numbness.  EXTREMITIES:  She has full strength in the upper and lower extremities.  She  has hyperactive reflexes in bilateral upper and lower extremities.  BACK:  No tenderness to palpation.  No pain over the hips.  She has  no  abdominal pain to palpation.  She has mild scar dysesthesias.  She is able  to do single leg raise, and with some difficulty double leg raise.  This  does cause some discomfort.   IMPRESSION:  1.  Chronic abdominal pain with scar paresthesias resulting in myofascial      pain syndrome.  She has had some worsening over time.  I think that you      can benefit from several sessions of physical therapy, i.e. three times      a week times three weeks and then resume home exercise program.  2.  Low back pain with lumbar MRI demonstrating a central disk herniation at      L4-5 and questionable mass effect on L5 roots, left greater than right,      but does not have any typical radicular findings nor does she have any      significant back pain which increases in a positional sense.  3.  Right C5-6 radiculopathy versus plexopathy.  MRI of her C-spine does not      show any right sided predominance, particularly at these levels.      Question how much of this is chronic.  Does not have any typical carpal      tunnel symptoms.  However, this and brachial plexopathy are in the      differential.  I can perform an EMG on her if symptoms progress.  At      this point, it is more of an annoyance rather than a disabling feature.   PLAN:  1.  We will restart Lidoderm patch to abdominal scar area.  2.  Nonnarcotic management, given history of substance abuse in past as well      as her history of doing quite well with a combination of adjuvant pain      management, such as nortriptyline in combination with Lidoderm and      physical therapy.  I think the Cymbalta should be able to take the place      of the nortriptyline.  I will see her back in about six weeks.      Erick Colace, M.D.  Electronically Signed     AEK/MedQ  D:  11/09/2005 10:49:25  T:  11/10/2005 08:44:51  Job #:  [id number]   cc:   Cristi Loron, M.D.  Fax: [phone]   Charlesetta Shanks Fax: [phone]   R. Roetta Sessions, M.D.  [address]  [place]  [place] [address]

## 2011-01-08 NOTE — Assessment & Plan Note (Signed)
HISTORY OF PRESENT ILLNESS:  Sabrina Wyatt returns today after I last saw her  on September 19, 2003. She has been making general improvement in her chronic  abdominal pain and has started strengthening exercises and continued them  now since her last visit. 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   SOCIAL HISTORY:  Lives with her daughter. She is single. Positive for  smoking 1/3 pack a day.   INTERVAL MEDICAL HISTORY:  None. She does followup with Dr. Jena Gauss for a  hiatal hernia as well as Dr. Rudi Heap for depression.   REVIEW OF SYSTEMS:  Positive for nausea, reflux, abdominal pain and poor  appetite. Weight gain, swelling, heart murmur, seizures, numbness, anxiety,  depression, poor sleep, agitation, headaches.   PHYSICAL EXAMINATION:  VITAL SIGNS:  Blood pressure 143/77, pulse 99,  respiratory rate 16, O2 saturation 98% on room air.  NEUROLOGIC:  Gait is normal. Affect is alert. This is a chaperoned visit  with Lucianne Lei, L.P.N. in attendance.  NECK:  Full range of motion. Has mild tenderness to palpation along the  cervical paraspinal's.  BACK:  She has no pain on palpation of the lumbar paraspinal. She has full  lumbar range of motion although on end range extension, she does have some  pulling along her abdominal scar.  ABDOMEN:  Positive bowel sounds. Soft and nontender. Her scar is no longer  tender to palpation. She is able to do single leg raise on each side without  problems. Double leg raise does cause some pulling on her abdominal  incision.  EXTREMITIES:  Lower extremity strength is full. Upper extremity strength is  full. Deep tendon reflexes are normal.   PAIN LEVEL:  Current pain level 4, averaging 3 to 7.   IMPRESSION:  Chronic abdominal pain right upper quadrant myofascial as well  as some scar sensitive sensation, causing paresthesias and  dysesthesias.   PLAN:  1. Will reduce physical therapy to 2 treatments x2 weeks, 1 treatment times     2 weeks and then graduate to home exercise program. She is going some     other exercises such as pilates.  2. In terms of sleep, still does not get a full night and I have encouraged     her to try increasing her Nortriptyline to 3     tablets q.h.s. This can go up as high as 50 mg a night and this     medication can be taken over by her psychiatrist, as I do not feel that     she needs to followup with me at this time, given her good improvement.      Erick Colace, M.D.   AEK/MedQ  D:  10/31/2003 17:21:03  T:  10/31/2003 21:16:14  Job #:  [id number]   cc:   R. Roetta Sessions, M.D.  [address]  [place]  [place] [address]  Fax: [phone]   Dr. Rudi Heap  Mental Health  [facility]

## 2011-01-08 NOTE — Op Note (Signed)
NAME:  Sabrina Wyatt, Sabrina Wyatt                        ACCOUNT NO.:  [id number]   MEDICAL RECORD NO.:  [id number]                   PATIENT TYPE:  AMB   LOCATION:  DAY                                  FACILITY:  [facility]   PHYSICIAN:  R. Roetta Sessions, M.D.              DATE OF BIRTH:  1951-04-01   DATE OF PROCEDURE:  09/10/2003  DATE OF DISCHARGE:                                 OPERATIVE REPORT   PROCEDURE:  Diagnostic esophagogastroduodenoscopy followed by colonoscopy  with biopsy.   ENDOSCOPIST:  Gerrit Friends. Rourk, M.D.   INDICATIONS FOR PROCEDURE:  The patient is a 60 year old lady with  gastroesophageal reflux disease symptoms.  She describes heartburn, now  somewhat refractory taking Protonix 40 mg orally daily. She does not have  any alarm picture such as odynophagia or dysphagia.  She comes for EGD.  Also, she is passed the threshold age for colorectal cancer screening.  To  this end I have offered her a colonoscopy at the same time.  We discussed  the The risks, benefits, and alternatives of both EGD and colonoscopy.  Her  questions were answered.  She is agreeable.  Please see my dictated September 02, 2003 H&P for more information.   PROCEDURE NOTE:  O2 saturation, blood pressure, pulse and respirations were  monitored throughout the entirety of both procedures.  Conscious sedation:  Versed 5 mg IV, Demerol 100 mg IV in divided doses   INSTRUMENT:  Olympus video chip adult gastroscope and pediatric colonoscope.   FINDINGS:  Examination of the tubular esophagus revealed no mucosal  abnormalities.  The EG junction was easily traversed.   STOMACH:  The gastric cavity was empty.  It insufflated well with air.  A  thorough examination of the gastric mucosa including a retroflex view of the  proximal stomach and esophagogastric junction demonstrated only a small  hiatal hernia. The pylorus was patent and easily traversed.   DUODENUM:  The bulb and the second portion revealed no  abnormalities.   THERAPEUTIC/DIAGNOSTIC MANEUVERS:  None.   The patient tolerated the procedure well and was prepared for colonoscopy.  A digital rectal exam revealed no abnormalities.   ENDOSCOPIC FINDINGS:  The prep was adequate.   RECTUM:  Examination of the rectal mucosa including the retroflex view of  the anal verge revealed a 3 mm polyp in at 5 cm from the anal verge. Please  see photos.  It was cold biopsied/removed.  The remainder of the rectal  mucosa appeared normal.   COLON:  The colonic mucosa was surveyed from the rectosigmoid junction  through the left transverse and right colon to the area of the appendiceal  orifice, ileocecal valve, and cecum.  These structures were well seen and  photographed for the record.   From this level the scope was slowly withdrawn.  All previously mentioned  mucosal surfaces were again seen; no colonic abnormalities mucosal  abnormalities were observed.  The patient tolerated both procedures well and  was reacted in endoscopy.   EGD IMPRESSION:  1. Normal esophagus.  2. Small hiatal hernia; otherwise normal stomach, normal D1 and D2.   COLONOSCOPY FINDINGS:  1. The diminutive polyp in the rectum cold biopsied/removed; otherwise     normal rectum.  2. Normal colon.   RECOMMENDATIONS:  1. Stop Protonix and begin Prevacid 30 mg SolTabs 1 daily for the next     month.  She is to go by and get 30 samples from my office.  2. Follow up on path from the polyp removed today.  3. Further recommendations to follow.  Will plan for an office visit in 3     months.      ___________________________________________                                            Jonathon Bellows, M.D.   RMR/MEDQ  D:  09/10/2003  T:  09/10/2003  Job:  [id number]   cc:   [facility]  [place]

## 2011-01-21 ENCOUNTER — Other Ambulatory Visit: Payer: Self-pay | Admitting: Internal Medicine

## 2011-01-21 DIAGNOSIS — Z139 Encounter for screening, unspecified: Secondary | ICD-10-CM

## 2011-02-09 ENCOUNTER — Ambulatory Visit: Payer: Medicare Other | Attending: Neurosurgery | Admitting: Physical Therapy

## 2011-02-09 DIAGNOSIS — IMO0001 Reserved for inherently not codable concepts without codable children: Secondary | ICD-10-CM | POA: Insufficient documentation

## 2011-02-09 DIAGNOSIS — M546 Pain in thoracic spine: Secondary | ICD-10-CM | POA: Insufficient documentation

## 2011-02-09 DIAGNOSIS — M545 Low back pain, unspecified: Secondary | ICD-10-CM | POA: Insufficient documentation

## 2011-02-11 ENCOUNTER — Ambulatory Visit: Payer: Medicare Other | Admitting: Physical Therapy

## 2011-02-19 ENCOUNTER — Ambulatory Visit (HOSPITAL_COMMUNITY): Payer: Medicare Other

## 2011-02-19 ENCOUNTER — Ambulatory Visit (HOSPITAL_COMMUNITY)
Admission: RE | Admit: 2011-02-19 | Discharge: 2011-02-19 | Disposition: A | Discharge status: Home / Self Care | Payer: Medicare Other | Source: Ambulatory Visit | Attending: Internal Medicine | Admitting: Internal Medicine

## 2011-02-19 DIAGNOSIS — Z1231 Encounter for screening mammogram for malignant neoplasm of breast: Secondary | ICD-10-CM | POA: Insufficient documentation

## 2011-02-19 DIAGNOSIS — Z139 Encounter for screening, unspecified: Secondary | ICD-10-CM

## 2011-02-25 ENCOUNTER — Encounter: Payer: Medicare Other | Admitting: Physical Therapy

## 2011-03-01 ENCOUNTER — Ambulatory Visit: Payer: Medicare Other | Attending: Neurosurgery | Admitting: Physical Therapy

## 2011-03-01 DIAGNOSIS — M545 Low back pain, unspecified: Secondary | ICD-10-CM | POA: Insufficient documentation

## 2011-03-01 DIAGNOSIS — IMO0001 Reserved for inherently not codable concepts without codable children: Secondary | ICD-10-CM | POA: Insufficient documentation

## 2011-03-01 DIAGNOSIS — M546 Pain in thoracic spine: Secondary | ICD-10-CM | POA: Insufficient documentation

## 2011-03-03 ENCOUNTER — Encounter: Payer: Medicare Other | Admitting: Physical Therapy

## 2011-03-04 ENCOUNTER — Other Ambulatory Visit: Payer: Self-pay | Admitting: Internal Medicine

## 2011-03-04 DIAGNOSIS — R928 Other abnormal and inconclusive findings on diagnostic imaging of breast: Secondary | ICD-10-CM

## 2011-03-08 ENCOUNTER — Ambulatory Visit: Payer: Medicare Other | Admitting: Physical Therapy

## 2011-03-10 ENCOUNTER — Ambulatory Visit: Payer: Medicare Other | Admitting: Physical Therapy

## 2011-03-15 ENCOUNTER — Ambulatory Visit: Payer: Medicare Other | Admitting: Physical Therapy

## 2011-03-18 ENCOUNTER — Encounter: Payer: Medicare Other | Admitting: Physical Therapy

## 2011-03-23 ENCOUNTER — Ambulatory Visit: Payer: Medicare Other | Attending: Neurosurgery | Admitting: Physical Therapy

## 2011-03-23 DIAGNOSIS — M545 Low back pain, unspecified: Secondary | ICD-10-CM | POA: Insufficient documentation

## 2011-03-23 DIAGNOSIS — IMO0001 Reserved for inherently not codable concepts without codable children: Secondary | ICD-10-CM | POA: Insufficient documentation

## 2011-03-23 DIAGNOSIS — M546 Pain in thoracic spine: Secondary | ICD-10-CM | POA: Insufficient documentation

## 2011-03-24 ENCOUNTER — Other Ambulatory Visit: Payer: Self-pay | Admitting: Obstetrics and Gynecology

## 2011-03-24 DIAGNOSIS — Z124 Encounter for screening for malignant neoplasm of cervix: Secondary | ICD-10-CM | POA: Insufficient documentation

## 2011-03-26 ENCOUNTER — Encounter: Payer: Medicare Other | Admitting: *Deleted

## 2011-03-30 ENCOUNTER — Encounter: Payer: Medicare Other | Admitting: Physical Therapy

## 2011-03-31 ENCOUNTER — Ambulatory Visit (HOSPITAL_COMMUNITY)
Admission: RE | Admit: 2011-03-31 | Discharge: 2011-03-31 | Disposition: A | Discharge status: Home / Self Care | Payer: Medicare Other | Source: Ambulatory Visit | Attending: Internal Medicine | Admitting: Internal Medicine

## 2011-03-31 ENCOUNTER — Other Ambulatory Visit (HOSPITAL_COMMUNITY): Payer: Self-pay | Admitting: Internal Medicine

## 2011-03-31 ENCOUNTER — Other Ambulatory Visit: Payer: Self-pay | Admitting: Internal Medicine

## 2011-03-31 DIAGNOSIS — R928 Other abnormal and inconclusive findings on diagnostic imaging of breast: Secondary | ICD-10-CM

## 2011-03-31 DIAGNOSIS — N6489 Other specified disorders of breast: Secondary | ICD-10-CM | POA: Insufficient documentation

## 2011-04-01 ENCOUNTER — Encounter: Payer: Medicare Other | Admitting: Physical Therapy

## 2011-04-14 ENCOUNTER — Encounter: Payer: Medicare Other | Admitting: Physical Therapy

## 2011-04-16 ENCOUNTER — Encounter: Payer: Medicare Other | Admitting: *Deleted

## 2011-04-26 ENCOUNTER — Other Ambulatory Visit (HOSPITAL_COMMUNITY)
Admission: RE | Admit: 2011-04-26 | Discharge: 2011-04-26 | Disposition: A | Discharge status: Home / Self Care | Payer: Medicare Other | Source: Ambulatory Visit | Attending: Obstetrics and Gynecology | Admitting: Obstetrics and Gynecology

## 2011-05-12 LAB — CBC
HCT: 46.7 — ABNORMAL HIGH
Hemoglobin: 15.9 — ABNORMAL HIGH
MCHC: 34
MCV: 94.4
Platelets: 182
RBC: 4.94
RDW: 13
WBC: 9.6

## 2011-05-12 LAB — URINALYSIS, ROUTINE W REFLEX MICROSCOPIC
Bilirubin Urine: NEGATIVE
Glucose, UA: NEGATIVE
Hgb urine dipstick: NEGATIVE
Ketones, ur: NEGATIVE
Nitrite: NEGATIVE
Protein, ur: NEGATIVE
Specific Gravity, Urine: <1.005 — ABNORMAL LOW
Urobilinogen, UA: 0.2
pH: 6.5

## 2011-05-12 LAB — DIFFERENTIAL
Basophils Absolute: 0.1
Basophils Relative: 1
Eosinophils Absolute: 0.3
Eosinophils Relative: 3
Lymphocytes Relative: 37
Lymphs Abs: 3.6
Monocytes Absolute: 0.5
Monocytes Relative: 6
Neutro Abs: 5.2
Neutrophils Relative %: 54

## 2011-05-12 LAB — ETHANOL: Alcohol, Ethyl (B): 304 — ABNORMAL HIGH

## 2011-05-12 LAB — BASIC METABOLIC PANEL
BUN: 9
CO2: 30
Calcium: 8.3 — ABNORMAL LOW
Chloride: 111
Creatinine, Ser: 0.7
GFR calc Af Amer: >60
GFR calc non Af Amer: >60
Glucose, Bld: 96
Potassium: 3.6
Sodium: 145

## 2011-05-12 LAB — RAPID URINE DRUG SCREEN, HOSP PERFORMED
Amphetamines: NOT DETECTED
Barbiturates: NOT DETECTED
Benzodiazepines: NOT DETECTED
Cocaine: NOT DETECTED
Opiates: NOT DETECTED
Tetrahydrocannabinol: NOT DETECTED

## 2011-05-20 ENCOUNTER — Ambulatory Visit (INDEPENDENT_AMBULATORY_CARE_PROVIDER_SITE_OTHER): Payer: Medicare Other | Admitting: Urgent Care

## 2011-05-20 ENCOUNTER — Encounter: Payer: Self-pay | Admitting: Urgent Care

## 2011-05-20 VITALS — BP 139/79 | HR 68 | Temp 97.0°F | Ht 66.0 in | Wt 141.2 lb

## 2011-05-20 DIAGNOSIS — K660 Peritoneal adhesions (postprocedural) (postinfection): Secondary | ICD-10-CM

## 2011-05-20 DIAGNOSIS — K7460 Unspecified cirrhosis of liver: Secondary | ICD-10-CM

## 2011-05-20 DIAGNOSIS — R109 Unspecified abdominal pain: Secondary | ICD-10-CM

## 2011-05-20 DIAGNOSIS — K219 Gastro-esophageal reflux disease without esophagitis: Secondary | ICD-10-CM

## 2011-05-20 MED ORDER — DOCUSATE SODIUM 100 MG PO CAPS: 100.0000 mg | ORAL_CAPSULE | Freq: Two times a day (BID) | ORAL | Status: DC | PRN

## 2011-05-20 NOTE — Assessment & Plan Note (Signed)
Well controlled on omeprazole 20mg daily.  °

## 2011-05-20 NOTE — Progress Notes (Signed)
Cc to PCP 

## 2011-05-20 NOTE — Assessment & Plan Note (Signed)
Good results w/ Physical therapy for abd wall pain & chronic adhesions. Renew referral to PT

## 2011-05-20 NOTE — Assessment & Plan Note (Signed)
See abd pain.  

## 2011-05-20 NOTE — Patient Instructions (Signed)
ABDOMINAL Ultrasound due Nov 2012 Go get labs today Follow up w/ Dr Felecia Shelling regarding your abnormal mammogram Colonoscopy in 2015 EGD (upper endoscopy) 08/2012 Follow up w/ physical therapy for your abdominal pain

## 2011-05-20 NOTE — Assessment & Plan Note (Signed)
At baseline.    ABDOMINAL Ultrasound due Nov 2012 to screen for [facility] AFP, CBC, PT/INR, LFTS due Follow up w/ Dr Felecia Shelling regarding your abnormal mammogram Colonoscopy in 2015 EGD (upper endoscopy) 08/2012 to screen for varices

## 2011-05-20 NOTE — Progress Notes (Addendum)
Referring Provider: Avon Gully, MD Primary Care Physician:  Avon Gully, MD Primary Gastroenterologist:  Dr. Jena Gauss Dr. Emelda Fear (Gyn) Dr. Gerilyn Pilgrim (Neuro) Dr Leretha Pol ([place]-neurosurgeon) Dr. Butler Denmark (Hand Specialist)  Chief Complaint  Patient presents with  . Follow-up    HPI:  Sabrina Wyatt is a 60 y.o. 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  Hx complicated cholecystectomy as below.  Wants dulcolax rx for constipation.  Needs Rx for physical therapy for myofascial release, chronic abd wall pain.  RUQ 8/10 at worst, intermittent, now 0/10.  "air bubbles"  Wrist surgery scheduled 10/9.  Past Medical History  Diagnosis Date  . GERD (gastroesophageal reflux disease)   . Cirrhosis, hepatitis C     C  . Depression   . Carpal tunnel syndrome   . S/P endoscopy 08/27/10    antral erosions, otherwise normal  . Abdominal wall pain     chronic  . Pulmonary nodules   . PTSD (post-traumatic stress disorder)   . Chronic low back pain   . 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 Polysubstance abuse     HX of  . S/P colonoscopy 2005    Dr Arlie Solomons polyp removed, otherwise normal  . Hypertension   . Tobacco abuse     Past Surgical History  Procedure Date  . Cholecystectomy 2002  . Percutaneous transhepatic cholangiograhpy and billiary drainage 2002  . Roux-en-y-hepatojejunostomy 2003  . Spinal fusion     C5-C7  . Carpal tunnel release 12/2010    Current Outpatient Prescriptions  Medication Sig Dispense Refill  . Ascorbic Acid (VITAMIN C) 100 MG tablet Take 100 mg by mouth daily.        Marland Kitchen atenolol (TENORMIN) 25 MG tablet Take 25 mg by mouth daily.        . Calcium Carbonate-Vitamin D (CALTRATE 600+D) 600-400 MG-UNIT per tablet Take 1 tablet by mouth daily.        . DULoxetine (CYMBALTA) 60 MG capsule Take 60 mg by mouth daily.        . fish oil-omega-3 fatty acids 1000 MG capsule Take 2 g by mouth daily.        . fluticasone  (FLONASE) 50 MCG/ACT nasal spray Place 2 sprays into the nose daily.        Marland Kitchen omeprazole (PRILOSEC) 20 MG capsule Take 20 mg by mouth daily.        Marland Kitchen oxyCODONE (OXY IR/ROXICODONE) 5 MG immediate release tablet Take 5 mg by mouth every 4 (four) hours as needed. Decreasing dose         Allergies as of 05/20/2011  . (No Known Allergies)    Family History:There is no known family history of colorectal carcinoma , liver disease, or inflammatory bowel disease.  Problem Relation Age of Onset  . Coronary artery disease Brother   . Breast cancer Sister     History   Social History  . Marital Status: Single    Spouse Name: N/A    Number of Children: 1  . Years of Education: N/A   Occupational History  . disabled    Social History Main Topics  . Smoking status: Current Everyday Smoker -- 1.0 packs/day for 30 years    Types: Cigarettes  . Smokeless tobacco: Not on file   Comment: 3 cigs per day now, trying to quit  . Alcohol Use: No  . Drug Use: No     remote in teens-marijuana, heroin, cocaine  .  Sexually Active: No   Other Topics Concern  . Not on file   Social History Narrative  . No narrative on file    Review of Systems: See HPI, otherwise negative ROS  Physical Exam: BP 139/79  Pulse 68  Temp(Src) 97 F (36.1 C) (Temporal)  Ht 5\' 6"  (1.676 m)  Wt 141 lb 3.2 oz (64.048 kg)  BMI 22.79 kg/m2 General:   Alert,  Well-developed, well-nourished, pleasant and cooperative in NAD Head:  Normocephalic and atraumatic. Eyes:  Sclera clear, no icterus.   Conjunctiva pink. Mouth:  No deformity or lesions, dentition normal. Neck:  Supple; no masses or thyromegaly. Heart:  Regular rate and rhythm; no murmurs, clicks, rubs,  or gallops. Abdomen:  Soft, nontender and nondistended. +multiple old abdominal scars.  No masses, hepatosplenomegaly or hernias noted. Normal bowel sounds, without guarding, and without rebound.   Msk:  Symmetrical without gross deformities. Normal  posture. Pulses:  Normal pulses noted. Extremities:  Without clubbing or edema. Neurologic:  Alert and  oriented x4;  grossly normal neurologically. Skin:  Intact without significant lesions or rashes. Cervical Nodes:  No significant cervical adenopathy. Psych:  Alert and cooperative. Normal mood and affect.

## 2011-05-21 LAB — CBC WITH DIFFERENTIAL/PLATELET
Basophils Absolute: 0.1 10*3/uL (ref 0.0–0.1)
Basophils Relative: 1 % (ref 0–1)
Eosinophils Absolute: 0.3 10*3/uL (ref 0.0–0.7)
Eosinophils Relative: 4 % (ref 0–5)
HCT: 41.5 % (ref 36.0–46.0)
Hemoglobin: 13.3 g/dL (ref 12.0–15.0)
Lymphocytes Relative: 36 % (ref 12–46)
Lymphs Abs: 2.8 10*3/uL (ref 0.7–4.0)
MCH: 31.2 pg (ref 26.0–34.0)
MCHC: 32 g/dL (ref 30.0–36.0)
MCV: 97.4 fL (ref 78.0–100.0)
Monocytes Absolute: 0.7 10*3/uL (ref 0.1–1.0)
Monocytes Relative: 9 % (ref 3–12)
Neutro Abs: 3.9 10*3/uL (ref 1.7–7.7)
Neutrophils Relative %: 51 % (ref 43–77)
Platelets: 172 10*3/uL (ref 150–400)
RBC: 4.26 MIL/uL (ref 3.87–5.11)
RDW: 12.6 % (ref 11.5–15.5)
WBC: 7.8 10*3/uL (ref 4.0–10.5)

## 2011-05-21 LAB — HEPATIC FUNCTION PANEL
ALT: 16 U/L (ref 0–35)
AST: 17 U/L (ref 0–37)
Albumin: 4.2 g/dL (ref 3.5–5.2)
Alkaline Phosphatase: 82 U/L (ref 39–117)
Bilirubin, Direct: 0.1 mg/dL (ref 0.0–0.3)
Indirect Bilirubin: 0.3 mg/dL (ref 0.0–0.9)
Total Bilirubin: 0.4 mg/dL (ref 0.3–1.2)
Total Protein: 6.7 g/dL (ref 6.0–8.3)

## 2011-05-21 LAB — PROTIME-INR
INR: 1 (ref <1.50)
Prothrombin Time: 13.6 seconds (ref 11.6–15.2)

## 2011-05-22 LAB — AFP TUMOR MARKER: AFP-Tumor Marker: 3.2 ng/mL (ref 0.0–8.0)

## 2011-05-24 MED ORDER — DOCUSATE SODIUM 100 MG PO CAPS: 100.0000 mg | ORAL_CAPSULE | Freq: Two times a day (BID) | ORAL | Status: DC | PRN

## 2011-05-24 NOTE — Progress Notes (Signed)
Cc Results to PCP 

## 2011-05-24 NOTE — Progress Notes (Signed)
Tried to call pt- LMOM 

## 2011-05-24 NOTE — Progress Notes (Signed)
Pt aware, she uses [facility]

## 2011-05-24 NOTE — Progress Notes (Signed)
Please call pt.  I had told her I would send rx for stool softeners (colace) for her, however I do not have a pharmacy. Thanks

## 2011-05-24 NOTE — Progress Notes (Signed)
Quick Note:  Please let pt know all her labs look great Cc: FANTA,TESFAYE, MD and all her providers listed on last OV note 6 mo FU as planned Keep [place] 06/2011 Thanks ______

## 2011-05-24 NOTE — Progress Notes (Signed)
Addended by: Joselyn Arrow on: 05/24/2011 03:02 PM   Modules accepted: Orders

## 2011-05-26 ENCOUNTER — Encounter: Payer: Self-pay | Admitting: Gastroenterology

## 2011-05-26 ENCOUNTER — Telehealth: Payer: Self-pay | Admitting: Gastroenterology

## 2011-05-26 NOTE — Telephone Encounter (Signed)
PT referral faxed and abdominal ultrasound scheduled for 06/28/11- Info mailed to pt

## 2011-06-10 ENCOUNTER — Ambulatory Visit: Payer: Medicare Other | Admitting: Physical Therapy

## 2011-06-16 ENCOUNTER — Ambulatory Visit (HOSPITAL_COMMUNITY): Payer: Medicare Other | Admitting: Physical Therapy

## 2011-06-23 ENCOUNTER — Ambulatory Visit: Payer: Medicare Other | Admitting: Physical Therapy

## 2011-06-24 ENCOUNTER — Ambulatory Visit: Payer: Medicare Other | Admitting: Physical Therapy

## 2011-06-28 ENCOUNTER — Ambulatory Visit (HOSPITAL_COMMUNITY)
Admission: RE | Admit: 2011-06-28 | Discharge: 2011-06-28 | Disposition: A | Discharge status: Home / Self Care | Payer: Medicare Other | Source: Ambulatory Visit | Attending: Urgent Care | Admitting: Urgent Care

## 2011-06-28 DIAGNOSIS — K7460 Unspecified cirrhosis of liver: Secondary | ICD-10-CM | POA: Insufficient documentation

## 2011-07-01 ENCOUNTER — Telehealth: Payer: Self-pay | Admitting: Urgent Care

## 2011-07-01 NOTE — Telephone Encounter (Signed)
Pt has had it done. Ginger called radiology and requested report be faxed to [place].

## 2011-07-01 NOTE — Telephone Encounter (Signed)
Please see if pt had [place] on 11/5 Thanks

## 2011-07-05 ENCOUNTER — Telehealth: Payer: Self-pay | Admitting: Urgent Care

## 2011-07-05 ENCOUNTER — Telehealth: Payer: Self-pay | Admitting: Internal Medicine

## 2011-07-05 ENCOUNTER — Ambulatory Visit (HOSPITAL_COMMUNITY): Admission: RE | Admit: 2011-07-05 | Payer: Medicare Other | Source: Ambulatory Visit | Admitting: Physical Therapy

## 2011-07-05 NOTE — Telephone Encounter (Signed)
[facility] PT Dept called to let us know that pt has no showed 3 times with them and says patient will reschedule after Thanksgiving. PT will need a new referral order.

## 2011-07-05 NOTE — Telephone Encounter (Signed)
Discussed results ultrasound w/ pt.   Liver stable-no hepatoma New Finding- Borderline abdominal aorta-needs to be followed by PCP. Patient aware agrees with plan. She has OV with Dr. Felecia Shelling this week. To ER with any abdominal pain immediately CC: Dr. Felecia Shelling

## 2011-07-09 ENCOUNTER — Telehealth: Payer: Self-pay | Admitting: Internal Medicine

## 2011-07-09 NOTE — Telephone Encounter (Signed)
Pt is on Dec 2012 recall list to have a repeat ultrasound in 6 months.

## 2011-07-12 ENCOUNTER — Encounter: Payer: Self-pay | Admitting: Gastroenterology

## 2011-07-12 ENCOUNTER — Other Ambulatory Visit: Payer: Self-pay | Admitting: Gastroenterology

## 2011-07-12 DIAGNOSIS — K7460 Unspecified cirrhosis of liver: Secondary | ICD-10-CM

## 2011-07-12 NOTE — Telephone Encounter (Signed)
[place] scheduled for 07/27/11 @8 - Letter mailed

## 2011-07-19 NOTE — Progress Notes (Signed)
REVIEWED.  

## 2011-07-27 ENCOUNTER — Ambulatory Visit (HOSPITAL_COMMUNITY)
Admission: RE | Admit: 2011-07-27 | Discharge: 2011-07-27 | Disposition: A | Discharge status: Home / Self Care | Payer: Medicare Other | Source: Ambulatory Visit | Attending: Internal Medicine | Admitting: Internal Medicine

## 2011-07-27 DIAGNOSIS — R935 Abnormal findings on diagnostic imaging of other abdominal regions, including retroperitoneum: Secondary | ICD-10-CM | POA: Insufficient documentation

## 2011-07-27 DIAGNOSIS — K7460 Unspecified cirrhosis of liver: Secondary | ICD-10-CM | POA: Insufficient documentation

## 2011-07-27 NOTE — Progress Notes (Signed)
Pt is aware of results. 

## 2011-08-04 ENCOUNTER — Other Ambulatory Visit (HOSPITAL_COMMUNITY): Payer: Self-pay | Admitting: Internal Medicine

## 2011-08-04 DIAGNOSIS — K7460 Unspecified cirrhosis of liver: Secondary | ICD-10-CM

## 2011-08-10 ENCOUNTER — Encounter: Payer: Self-pay | Admitting: Internal Medicine

## 2011-08-13 ENCOUNTER — Other Ambulatory Visit (HOSPITAL_COMMUNITY): Payer: Self-pay | Admitting: Neurosurgery

## 2011-08-13 DIAGNOSIS — M542 Cervicalgia: Secondary | ICD-10-CM

## 2011-08-18 ENCOUNTER — Ambulatory Visit (HOSPITAL_COMMUNITY): Payer: Medicare Other

## 2011-08-25 ENCOUNTER — Ambulatory Visit (HOSPITAL_COMMUNITY): Payer: Medicare Other

## 2011-08-26 ENCOUNTER — Other Ambulatory Visit: Payer: Self-pay | Admitting: Internal Medicine

## 2011-09-14 ENCOUNTER — Other Ambulatory Visit: Payer: Self-pay | Admitting: Obstetrics and Gynecology

## 2011-09-14 DIAGNOSIS — N6009 Solitary cyst of unspecified breast: Secondary | ICD-10-CM

## 2011-10-13 ENCOUNTER — Ambulatory Visit (HOSPITAL_COMMUNITY)
Admission: RE | Admit: 2011-10-13 | Discharge: 2011-10-13 | Disposition: A | Discharge status: Home / Self Care | Payer: Medicare Other | Source: Ambulatory Visit | Attending: Obstetrics and Gynecology | Admitting: Obstetrics and Gynecology

## 2011-10-13 ENCOUNTER — Ambulatory Visit (HOSPITAL_COMMUNITY)
Admission: RE | Admit: 2011-10-13 | Discharge: 2011-10-13 | Disposition: A | Discharge status: Home / Self Care | Payer: Medicare Other | Source: Ambulatory Visit | Attending: Neurosurgery | Admitting: Neurosurgery

## 2011-10-13 DIAGNOSIS — Z09 Encounter for follow-up examination after completed treatment for conditions other than malignant neoplasm: Secondary | ICD-10-CM | POA: Insufficient documentation

## 2011-10-13 DIAGNOSIS — R209 Unspecified disturbances of skin sensation: Secondary | ICD-10-CM | POA: Insufficient documentation

## 2011-10-13 DIAGNOSIS — M5380 Other specified dorsopathies, site unspecified: Secondary | ICD-10-CM | POA: Insufficient documentation

## 2011-10-13 DIAGNOSIS — N6009 Solitary cyst of unspecified breast: Secondary | ICD-10-CM | POA: Insufficient documentation

## 2011-10-13 DIAGNOSIS — M542 Cervicalgia: Secondary | ICD-10-CM | POA: Insufficient documentation

## 2011-10-25 ENCOUNTER — Encounter: Payer: Self-pay | Admitting: Internal Medicine

## 2011-10-26 ENCOUNTER — Ambulatory Visit: Payer: Medicare Other | Admitting: Gastroenterology

## 2011-11-01 ENCOUNTER — Telehealth: Payer: Self-pay | Admitting: Gastroenterology

## 2011-11-01 ENCOUNTER — Ambulatory Visit (INDEPENDENT_AMBULATORY_CARE_PROVIDER_SITE_OTHER): Payer: Medicare Other | Admitting: Gastroenterology

## 2011-11-01 ENCOUNTER — Encounter: Payer: Self-pay | Admitting: Gastroenterology

## 2011-11-01 DIAGNOSIS — K660 Peritoneal adhesions (postprocedural) (postinfection): Secondary | ICD-10-CM

## 2011-11-01 DIAGNOSIS — K7460 Unspecified cirrhosis of liver: Secondary | ICD-10-CM

## 2011-11-01 DIAGNOSIS — B182 Chronic viral hepatitis C: Secondary | ICD-10-CM

## 2011-11-01 DIAGNOSIS — R109 Unspecified abdominal pain: Secondary | ICD-10-CM

## 2011-11-01 DIAGNOSIS — K219 Gastro-esophageal reflux disease without esophagitis: Secondary | ICD-10-CM

## 2011-11-01 NOTE — Patient Instructions (Signed)
Please have your lab work done. Please have your ultrasound done in 12/2011. Return to our office in six months.

## 2011-11-01 NOTE — Progress Notes (Signed)
Primary Care Physician: Avon Gully, MD, MD  Primary Gastroenterologist:  Roetta Sessions, MD   Chief Complaint  Patient presents with  . Follow-up    cirrhosis, abd pain    HPI: Sabrina Wyatt is a 61 y.o. female here for followup of chronic liver disease/cirrhosis, history of hepatitis C status post eradication, complicated cholecystectomy with chronic abdominal wall pain. Last seen in September of 2012. Abdominal ultrasound at that time abdominal aorta measuring 3cm. Saw Dr. Felecia Shelling. Next u/s for 12/2011.  Since last OV, saw Dr. Gerilyn Pilgrim for pain management. States she has now been released from his practice. She did not agree with his Rx for dilaudid. Previously tapered down to 5mg  BID on oxycodone per patient. States he gave her Dilaudid 4mg  and she did not want it. "Felt drugged". She has went back to Dr. Lovell Sheehan and getting oxycodone from him now but he is trying to get her accepted into another pain management program ([facility]). Dr. Felecia Shelling won't write for any narcotics.   She brought paper for home health today. I asked her to discuss with Dr. Felecia Shelling.   From GI standpoint, her bowel movements are doing well. No melena, brbpr. No heartburn. No nausea vomiting or weight loss.continues to have chronic right upper quadrant abdominal discomfort. Describes as a burning pain. Previously physical therapy helped a lot for her chronic pain and bloating.     Current Outpatient Prescriptions  Medication Sig Dispense Refill  . Ascorbic Acid (VITAMIN C) 100 MG tablet Take 100 mg by mouth daily.        Marland Kitchen atenolol (TENORMIN) 25 MG tablet Take 25 mg by mouth daily.        . Calcium Carbonate-Vitamin D (CALTRATE 600+D) 600-400 MG-UNIT per tablet Take 1 tablet by mouth daily.        Marland Kitchen docusate sodium (COLACE) 100 MG capsule Take 1 capsule (100 mg total) by mouth 2 (two) times daily as needed for constipation.  60 capsule  5  . DULoxetine (CYMBALTA) 60 MG capsule Take 60 mg by mouth  daily.        Marland Kitchen etodolac (LODINE) 200 MG capsule Take 200 mg by mouth every 8 (eight) hours.      . fish oil-omega-3 fatty acids 1000 MG capsule Take 2 g by mouth daily.        . fluticasone (FLONASE) 50 MCG/ACT nasal spray Place 2 sprays into the nose daily.        Marland Kitchen lidocaine (LIDODERM) 5 % Place 1 patch onto the skin daily. Remove & Discard patch within 12 hours or as directed by MD      . omeprazole (PRILOSEC) 20 MG capsule TAKE ONE CAPSULE BY MOUTH EVERY DAY  30 capsule  11  . oxyCODONE (OXY IR/ROXICODONE) 5 MG immediate release tablet Take 5 mg by mouth every 4 (four) hours as needed. Decreasing dose         Allergies as of 11/01/2011  . (No Known Allergies)    ROS:  General: Negative for anorexia, weight loss, fever, chills, fatigue, weakness. ENT: Negative for hoarseness, difficulty swallowing , nasal congestion. CV: Negative for chest pain, angina, palpitations, dyspnea on exertion, peripheral edema.  Respiratory: Negative for dyspnea at rest, dyspnea on exertion, cough, sputum, wheezing.  GI: See history of present illness. GU:  Negative for dysuria, hematuria, urinary incontinence, urinary frequency, nocturnal urination.  Endo: Negative for unusual weight change.    Physical Examination:   BP 128/80  Pulse 83  Temp(Src)  97.6 F (36.4 C) (Temporal)  Ht 5\' 6"  (1.676 m)  Wt 146 lb 12.8 oz (66.588 kg)  BMI 23.69 kg/m2  General: Well-nourished, well-developed in no acute distress.  Eyes: No icterus. Mouth: Oropharyngeal mucosa moist and pink , no lesions erythema or exudate. Lungs: Clear to auscultation bilaterally.  Heart: Regular rate and rhythm, no murmurs rubs or gallops.  Abdomen: Bowel sounds are normal, nontender,multiple old abd scars.  nondistended, no hepatosplenomegaly or masses, no abdominal bruits or hernia , no rebound or guarding.   Extremities: No lower extremity edema. No clubbing or deformities. Neuro: Alert and oriented x 4   Skin: Warm and dry, no  jaundice.   Psych: Alert and cooperative, normal mood and affect.

## 2011-11-01 NOTE — Telephone Encounter (Signed)
Referral faxed to [facility]-

## 2011-11-01 NOTE — Assessment & Plan Note (Addendum)
H/O chronic abdominal wall pain and chronic adhesions. Chronic bloating. Previously did well on PT. Recently denied further pain management at Dr. Harriett Sine, according to letter it was secondary to receiving RX for narcotics from other providers. Patient denies, states she did not agree with plan to take Dilaudid. She is now receiving oxycodone from Dr. Lovell Sheehan. She asked me for RX today, I declined. Will set up PT again for chronic abdominal wall pain.

## 2011-11-01 NOTE — Progress Notes (Signed)
Faxed to PCP

## 2011-11-01 NOTE — Assessment & Plan Note (Signed)
Well controlled on omeprazole 

## 2011-11-01 NOTE — Assessment & Plan Note (Addendum)
No complaints. No evidence of cholestasis, third spacing, etc. H/O eradication of HCV. Due for labs. Abd u/s in 12/2011. EGD 08/2012. TCS 2015. OV with Dr. Jena Gauss in six months.

## 2011-11-09 ENCOUNTER — Ambulatory Visit (HOSPITAL_COMMUNITY): Payer: Medicare Other | Admitting: Physical Therapy

## 2011-11-17 ENCOUNTER — Ambulatory Visit (HOSPITAL_COMMUNITY)
Admission: RE | Admit: 2011-11-17 | Discharge: 2011-11-17 | Disposition: A | Discharge status: Home / Self Care | Payer: Medicare Other | Source: Ambulatory Visit | Attending: Gastroenterology | Admitting: Gastroenterology

## 2011-11-17 DIAGNOSIS — R109 Unspecified abdominal pain: Secondary | ICD-10-CM | POA: Insufficient documentation

## 2011-11-17 DIAGNOSIS — IMO0001 Reserved for inherently not codable concepts without codable children: Secondary | ICD-10-CM | POA: Insufficient documentation

## 2011-11-17 DIAGNOSIS — M545 Low back pain, unspecified: Secondary | ICD-10-CM | POA: Insufficient documentation

## 2011-11-17 DIAGNOSIS — I10 Essential (primary) hypertension: Secondary | ICD-10-CM | POA: Insufficient documentation

## 2011-11-17 DIAGNOSIS — M6281 Muscle weakness (generalized): Secondary | ICD-10-CM | POA: Insufficient documentation

## 2011-11-17 NOTE — Evaluation (Addendum)
Physical Therapy Evaluation  Patient Details  Name: Sabrina Wyatt MRN: [id number] Date of Birth: 05/29/1951  Today's Date: 11/17/2011 Time: 4098-1191 Time Calculation (min): 44 min Charges: 1 eval Visit#: 1  of 8   Re-eval: 12/17/11 Assessment Diagnosis: Chronic Abdominal and Lumbar pain Prior Therapy: 1 year ago for LBP  Past Medical History:  Past Medical History  Diagnosis Date  . GERD (gastroesophageal reflux disease)   . Cirrhosis, hepatitis C     C  . Depression   . Carpal tunnel syndrome   . S/P endoscopy 08/27/10    antral erosions, otherwise normal, due egd 08/2012 to screen for varices  . Abdominal wall pain     chronic  . Pulmonary nodules   . PTSD (post-traumatic stress disorder)   . Chronic low back pain   . 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 Polysubstance abuse     HX of  . S/P colonoscopy 2005    Dr Arlie Solomons polyp removed, otherwise normal  . Hypertension   . Tobacco abuse    Past Surgical History:  Past Surgical History  Procedure Date  . Cholecystectomy 2002  . Percutaneous transhepatic cholangiograhpy and billiary drainage 2002  . Roux-en-y-hepatojejunostomy 2003  . Spinal fusion     C5-C7  . Carpal tunnel release 12/2010  . Esophagogastroduodenoscopy   09/10/2003    Small hiatal hernia; otherwise normal stomach, normal D1 and D2  . Colonoscopy   09/10/2003    diminutive polyp in the rectum cold biopsied/removed/ Normal colon  . Esophagogastroduodenoscopy 08/2010    Subjective Symptoms/Limitations Symptoms: Pt reports that 12 years ago had gallbladder surgery that did not go as plan.  She ended up having mulltiple surgeries afterward to fix an unintentional bile duct incision.  This lead to 17 months of limited activity and having multiple lines and leads in place.  Since then she has been suffering with pain in her abdomen.  More recenctly she has had carpal tunnel surgery on R hand and L [facility] joint 2012.  Lumbar and cervical surgery  4-5 years ago (back pain multiple years ago from landing on her head).  She is on pain medication patches and a TENS unit to control pain.  Her c/co is "my legs just feel like noodles" and states decreaed stamina, abdominal pain, and improper posture related to her gallbladder surgery. How long can you sit comfortably?: 60-90 minutes.  How long can you stand comfortably?: No difficulty. How long can you walk comfortably?: 45 minutes causally.  During the day she states she is winded just walking to and from her mailbox.  Pain Assessment Currently in Pain?: Yes Pain Score:   6 Pain Location: Neck Pain Orientation: Right  Prior Functioning  Prior Function Leisure: Hobbies-yes (Comment) Comments: She enjoys walking for exercise (used to walk 5 miles), staying in shape (using yoga videos. 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   Cognition/Observation Observation/Other Assessments Observations: Significant scar over her gallbladder area from extensive surgery, multiple portal scars from the lines and leads were in place for 17 months. sits with appropriate posture.  Wears a neoprene hand brace on her L hand.  She has hypermobility for hip ER, and is limited by 75% for hip IR demonstrated in sitting position (likely related to deep hip IR/ER weakness).   Sensation/Coordination/Flexibility/Functional Tests Functional Tests Functional Tests: Lower Extremity Functional Scale (LEFS): 29/80 Functional Tests: Oswestry Disability Index (ODI): 54%  Assessment RLE Strength RLE  Overall Strength Comments: all WNL except  Right Hip ABduction: 4/5 LLE Strength LLE Overall Strength Comments: all WNL except Left Hip ABduction: 4/5 Lumbar AROM Overall Lumbar AROM Comments: WNL: most pain with R SB Lumbar Strength Overall Lumbar Strength Comments: Hodges Score for TrA: 8/10.  Stronger to the L side compared to R side.  Palpation Palpation: Unable to reproduce pain over scar  area.  She had significant restriction throughout her scar.  Mobility/Balance  Static Standing Balance Single Leg Stance - Right Leg: 4  (Poor ankle and hip strategy) Single Leg Stance - Left Leg: 5  (Poor ankle and hip strategy) Tandem Stance - Right Leg: 8  (Poor ankle and hip strategy) Tandem Stance - Left Leg: 7  Rhomberg - Eyes Opened:  (Poor ankle and hip strategy) Rhomberg - Eyes Closed: 10  (supervision required.  Poor ankle and hip strategy)  Exercise/Treatments  Seated Other Seated Knee Exercises: Heel/Toe Roll in and outs 3x10 sec hold each Supine Other Supine Knee Exercises: Pelvic Floor Contraction 3x10 sec hold, decreased strength noted to R side and needs mod cueing for breath.  Used diaphragmatic breathing to train correctly.  Sidelying Other Sidelying Knee Exercises: R S/L Clams 3x10 sec holds  Physical Therapy Assessment and Plan PT Assessment and Plan Clinical Impression Statement: Pt is a 61 year old female with extensive history of chronic abdominal, low back and neck pain related to multiple events.  After examination it was found that she has current impairments including signficant increase in pain, reports of decreased muscular endurance and stamina, impaired balance, decreased bilatera hip AROM, and impaired percieved functional ability which is limiting her in participating in her high level exercise program.  Pt will benefit from skilled OP PT in order to reach functional goals.   PT Treatment/Interventions: Gait training;Functional mobility training;Therapeutic activities;Therapeutic exercise;Balance training;Neuromuscular re-education;Patient/family education;Other (comment) (Manual and Modalities for pain control and scar tissue ) PT Plan: Review HEP, Cont with core stabilization and move to seated ball exercise activities with core activation.  TM or Elliptical for endurance training.     Goals Home Exercise Program Pt will Perform Home Exercise Program:  Independently PT Goal: Perform Home Exercise Program - Progress: Goal set today PT Short Term Goals Time to Complete Short Term Goals: 2 weeks PT Short Term Goal 1: Pt will report pain less than 5/10 for 50% of her day. PT Short Term Goal 2: Pt will improve her balance and demonstrate tandem stance with appropriate hip and ankle strategy for 30 sec on static surface. PT Short Term Goal 3: Pt will improve her muscular endurance and report walking 15 minutes for excercise without SOB or an increase in pain.  PT Short Term Goal 4: Pt will be educated on an appropriate walking program for her.  PT Short Term Goal 5: Pt will improve her TrA muscular endurance to a 10/10 in the seated position.  PT Long Term Goals Time to Complete Long Term Goals: 4 weeks PT Long Term Goal 1: Pt will report pain less than a 4/10 for 75% of her day for improved QOL.  PT Long Term Goal 2: Pt will score at least a 40/80 on her LEFS and lower than 44% on her ODI for improved percieved functional ability.  Long Term Goal 3: Pt will report walking for 2 miles without an increase in pain to her abdominal or low back region.  Long Term Goal 4: Pt will independently demonstrate exercises activities on a ball in order  to return to enjoyable workout activities.   Problem List Patient Active Problem List  Diagnoses  . ALCOHOL USE  . TOBACCO ABUSE  . POST TRAUMATIC STRESS SYNDROME  . DEPRESSION  . CARPAL TUNNEL SYNDROME  . INTRACRANIAL HEMORRHAGE  . Esophageal reflux  . INTUSSUSCEPTION  . ABDOMINAL ADHESIONS  . Cirrhosis of liver due to hepatitis C  . OBSTRUCTION OF BILE DUCT  . LOW BACK PAIN, CHRONIC  . ABDOMINAL BLOATING  . ABDOMINAL PAIN, CHRONIC  . ABDOMINAL PAIN OTHER SPECIFIED SITE  . HEPATITIS C, HX OF    PT - End of Session Activity Tolerance: Patient tolerated treatment well PT Plan of Care PT Home Exercise Plan: See Scanned Document (PF contractions in supine 10x10 sec holds, hee/toe roll in and outs,  Clams 3x10 sec) Consulted and Agree with Plan of Care: Patient  GP  Functional Reporting Modifier  Current Status  [phone] - Mobility: Walking & Moving Around CK - At least 40% but less than 60% impaired, limited or restricted  Goal Status  [id number] - Mobility: [place] & Moving Around CI - At least 1% but less than 20% impaired, limited or restricted  Based on ODI: 54%, LEFS: 29/80.    Osbaldo Mark 11/17/2011, 12:38 PM  Physician Documentation Your signature is required to indicate approval of the treatment plan as stated above.  Please sign and either send electronically or make a copy of this report for your files and return this physician signed original.   Please mark one 1.__approve of plan  2. ___approve of plan with the following conditions.   ______________________________                                                          _____________________ Physician Signature                                                                                                             Date

## 2011-11-30 ENCOUNTER — Ambulatory Visit (HOSPITAL_COMMUNITY): Payer: Medicare Other | Admitting: Physical Therapy

## 2011-12-03 ENCOUNTER — Ambulatory Visit (HOSPITAL_COMMUNITY): Payer: Medicare Other

## 2011-12-03 ENCOUNTER — Telehealth (HOSPITAL_COMMUNITY): Payer: Self-pay

## 2011-12-07 ENCOUNTER — Ambulatory Visit (HOSPITAL_COMMUNITY)
Admission: RE | Admit: 2011-12-07 | Discharge: 2011-12-07 | Disposition: A | Discharge status: Home / Self Care | Payer: Medicare Other | Source: Ambulatory Visit | Attending: Internal Medicine | Admitting: Internal Medicine

## 2011-12-07 ENCOUNTER — Telehealth (HOSPITAL_COMMUNITY): Payer: Self-pay

## 2011-12-07 ENCOUNTER — Ambulatory Visit (HOSPITAL_COMMUNITY): Payer: Medicare Other

## 2011-12-07 DIAGNOSIS — M545 Low back pain, unspecified: Secondary | ICD-10-CM | POA: Insufficient documentation

## 2011-12-07 DIAGNOSIS — I10 Essential (primary) hypertension: Secondary | ICD-10-CM | POA: Insufficient documentation

## 2011-12-07 DIAGNOSIS — IMO0001 Reserved for inherently not codable concepts without codable children: Secondary | ICD-10-CM | POA: Insufficient documentation

## 2011-12-07 DIAGNOSIS — M6281 Muscle weakness (generalized): Secondary | ICD-10-CM | POA: Insufficient documentation

## 2011-12-09 ENCOUNTER — Ambulatory Visit (HOSPITAL_COMMUNITY): Payer: Medicare Other | Admitting: Physical Therapy

## 2011-12-14 ENCOUNTER — Ambulatory Visit (HOSPITAL_COMMUNITY): Payer: Medicare Other | Admitting: Physical Therapy

## 2011-12-14 ENCOUNTER — Telehealth (HOSPITAL_COMMUNITY): Payer: Self-pay | Admitting: Physical Therapy

## 2011-12-17 ENCOUNTER — Telehealth (HOSPITAL_COMMUNITY): Payer: Self-pay | Admitting: Physical Therapy

## 2011-12-17 ENCOUNTER — Ambulatory Visit (HOSPITAL_COMMUNITY): Payer: Medicare Other | Admitting: Physical Therapy

## 2011-12-21 ENCOUNTER — Ambulatory Visit (HOSPITAL_COMMUNITY)
Admission: RE | Admit: 2011-12-21 | Discharge: 2011-12-21 | Disposition: A | Discharge status: Home / Self Care | Payer: Medicare Other | Source: Ambulatory Visit | Attending: Gastroenterology | Admitting: Gastroenterology

## 2011-12-21 NOTE — Progress Notes (Signed)
Physical Therapy Treatment Patient Details  Name: Sabrina Wyatt MRN: [id number] Date of Birth: 1951-01-08  Today's Date: 12/21/2011 Time: 1105-1110 Time Calculation (min): 5 min Visit#: 2  of 8   Re-eval:    No charge   Subjective: Symptoms/Limitations Symptoms: Pt came into session with pain scale 4/10 R abdominal.  Pt entered with new orders for PT and OT.  It had been greater than 30 days since initial eval, pt stated she had been out due to allergies.  Session rescheduled for re-eval for PT and initial eval for OT Pain Assessment Currently in Pain?: Yes Pain Score:   4 Pain Location: Back Pain Orientation: Right;Anterior;Lateral  Objective:   Exercise/Treatments No treatment  Physical Therapy Assessment and Plan PT Assessment and Plan Clinical Impression Statement: It has been greater than 30 days since initial eval with no treatment.  Session rescheduled for re-eval with PT and initial eval for OT per MD order. PT Plan: Reschedule for re-eval PT, initial eval OT.    Goals    Problem List Patient Active Problem List  Diagnoses  . ALCOHOL USE  . TOBACCO ABUSE  . POST TRAUMATIC STRESS SYNDROME  . DEPRESSION  . CARPAL TUNNEL SYNDROME  . INTRACRANIAL HEMORRHAGE  . Esophageal reflux  . INTUSSUSCEPTION  . ABDOMINAL ADHESIONS  . Cirrhosis of liver due to hepatitis C  . OBSTRUCTION OF BILE DUCT  . LOW BACK PAIN, CHRONIC  . ABDOMINAL BLOATING  . ABDOMINAL PAIN, CHRONIC  . ABDOMINAL PAIN OTHER SPECIFIED SITE  . HEPATITIS C, HX OF      Juel Burrow, PTA 12/21/2011, 11:31 AM

## 2011-12-23 ENCOUNTER — Ambulatory Visit (HOSPITAL_COMMUNITY): Payer: Medicare Other | Admitting: Physical Therapy

## 2011-12-27 ENCOUNTER — Ambulatory Visit (HOSPITAL_COMMUNITY)
Admission: RE | Admit: 2011-12-27 | Discharge: 2011-12-27 | Disposition: A | Discharge status: Home / Self Care | Payer: Medicare Other | Source: Ambulatory Visit | Attending: Internal Medicine | Admitting: Internal Medicine

## 2011-12-27 DIAGNOSIS — K7460 Unspecified cirrhosis of liver: Secondary | ICD-10-CM

## 2011-12-27 DIAGNOSIS — Z9089 Acquired absence of other organs: Secondary | ICD-10-CM | POA: Insufficient documentation

## 2011-12-27 DIAGNOSIS — B1920 Unspecified viral hepatitis C without hepatic coma: Secondary | ICD-10-CM | POA: Insufficient documentation

## 2011-12-27 DIAGNOSIS — Z9889 Other specified postprocedural states: Secondary | ICD-10-CM | POA: Insufficient documentation

## 2011-12-28 ENCOUNTER — Ambulatory Visit (HOSPITAL_COMMUNITY)
Admission: RE | Admit: 2011-12-28 | Discharge: 2011-12-28 | Disposition: A | Discharge status: Home / Self Care | Payer: Medicare Other | Source: Ambulatory Visit | Attending: Gastroenterology | Admitting: Gastroenterology

## 2011-12-28 DIAGNOSIS — IMO0001 Reserved for inherently not codable concepts without codable children: Secondary | ICD-10-CM | POA: Insufficient documentation

## 2011-12-28 DIAGNOSIS — I10 Essential (primary) hypertension: Secondary | ICD-10-CM | POA: Insufficient documentation

## 2011-12-28 DIAGNOSIS — M545 Low back pain, unspecified: Secondary | ICD-10-CM | POA: Insufficient documentation

## 2011-12-28 DIAGNOSIS — G5600 Carpal tunnel syndrome, unspecified upper limb: Secondary | ICD-10-CM

## 2011-12-28 DIAGNOSIS — M6281 Muscle weakness (generalized): Secondary | ICD-10-CM | POA: Insufficient documentation

## 2011-12-28 NOTE — Evaluation (Signed)
Occupational Therapy Evaluation  Patient Details  Name: Sabrina Wyatt MRN: [id number] Date of Birth: [birth year]  Today's Date: 12/28/2011 Time: 1324-4010 OT Time Calculation (min): 40 min OT Eval [phone]  Manual Therapy [phone] 10 Visit#: 1  of 8   Re-eval: 01/25/12  Assessment Diagnosis: Left Hand Weakness Next MD Visit: unknown [facility]  Authorization: Medicare - will need to complete UEFI on 10th visit.  Today she scored 55/80.  For carrying goal she is CJ and goal is CI   Authorization Time Period:    Authorization Visit#: 1  of 10    Past Medical History:  Past Medical History  Diagnosis Date  . GERD (gastroesophageal reflux disease)   . Cirrhosis, hepatitis C     C  . Depression   . Carpal tunnel syndrome   . S/P endoscopy 08/27/10    antral erosions, otherwise normal, due egd 08/2012 to screen for varices  . Abdominal wall pain     chronic  . Pulmonary nodules   . PTSD (post-traumatic stress disorder)   . Chronic low back pain   . 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 Polysubstance abuse     HX of  . S/P colonoscopy 2005    Dr Arlie Solomons polyp removed, otherwise normal  . Hypertension   . Tobacco abuse    Past Surgical History:  Past Surgical History  Procedure Date  . Cholecystectomy 2002  . Percutaneous transhepatic cholangiograhpy and billiary drainage 2002  . Roux-en-y-hepatojejunostomy 2003  . Spinal fusion     C5-C7  . Carpal tunnel release 12/2010  . Esophagogastroduodenoscopy   09/10/2003    Small hiatal hernia; otherwise normal stomach, normal D1 and D2  . Colonoscopy   09/10/2003    diminutive polyp in the rectum cold biopsied/removed/ Normal colon  . Esophagogastroduodenoscopy 08/2010    Subjective Symptoms/Limitations Symptoms: S:  I had carpal tunnel release surgery in my right hand in October.  I had a tendon removed from my left thumb last summer.  I want to get my strength and flexibility  back. Limitations: Pertinent history:  Mrs. Kindig has had CTR surgery in her right hand and a tendon removed from her left thumb.  Both surgeries occurred in 2012.  She received therapy after both surgeries, but still has strength and flexibility deficits.  She has been referred to occupational therapy for evaluation and treatment. 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, makes jewelery, and is a Horticulturist, commercial) Leisure: Hobbies-yes (Comment) Comments: Enjoys bead making  Assessment ADL/Vision/Perception ADL ADL Comments: Most difficulties are with gripping and squeezing Dominant Hand: Right Vision - History Baseline Vision: No visual deficits Perception Perception: Within Functional Limits Praxis Praxis: Intact  Cognition/Observation Cognition Overall Cognitive Status: Appears within functional limits for tasks assessed Observation/Other Assessments Observations: bilateral hand surgical incisions have healed well Other Assessments: UEFI 55/80= 68%  Sensation/Coordination/Edema Sensation Light Touch: Appears Intact Coordination Gross Motor Movements are Fluid and Coordinated: Yes Fine Motor Movements are Fluid and Coordinated: Yes Edema Edema: N/A  Additional Assessments RUE AROM (degrees) Right Wrist Extension 0-70: 70 Degrees Right Wrist Flexion 0-80: 72 Degrees RUE Strength Grip (lbs): 43 Lateral Pinch: 14 lbs 3 Point Pinch: 8 lbs LUE AROM (degrees) Left  Wrist Extension 0-70: 58 Degrees Left Wrist Flexion 0-80: 64 Degrees LUE Strength Grip (lbs): 50 Lateral Pinch: 7 lbs 3 Point Pinch: 11 lbs Palpation Palpation: Minimal fascial restrictions in bilateral hands and wrists Right Hand Strength - Pinch (lbs) Lateral Pinch:  14 lbs 3 Point Pinch: 8 lbs Left Hand Strength - Pinch (lbs) Lateral Pinch: 7 lbs 3 Point Pinch: 11 lbs   Exercise/Treatments    Manual Therapy Manual Therapy: Myofascial release Myofascial Release: MFR to left wrist region and flexor and extensor forearm to decrease pain and restrictions and increase mobility.  Occupational Therapy Assessment and Plan OT Assessment and Plan Clinical Impression Statement: 61 year old female with history of bilateral hand surgeries in 2012 with decreased I with BADLs. Pt will benefit from skilled therapeutic intervention in order to improve on the following deficits: Decreased range of motion;Decreased strength;Impaired flexibility;Increased fascial restricitons Rehab Potential: Excellent OT Frequency: Min 2X/week OT Duration: 4 weeks OT Treatment/Interventions: Therapeutic exercise;Manual therapy;Patient/family education OT Plan: P:  Skilled OT intervention to increase AROM, strength and decrease restrictions.  Treatment Plan:  MFR to left wrist and forearm.  Grip and pinch strength exercises., tendon glides.   Goals Short Term Goals Time to Complete Short Term Goals: 2 weeks Short Term Goal 1: Patient will be educated on a HEP. Short Term Goal 2: Patient will increase bilateral grip strength by 4 pounds and pinch strength by 1 pound for increased independence with opening containers. Short Term Goal 3: Patient will increase left wrist AROM by 5 for increased ability to push open doors. Short Term Goal 4: Patient will decrease fascial restrictions in her left wrist to trace. Long Term Goals Time to Complete Long Term Goals: 4 weeks Long Term Goal 1: Patient will return to prior level of I with all B/IADLs. Long Term Goal 2: Patient will increase bilateral grip strength by 8 pounds and pinch strength by 3 pounds for increased ability to open containers. Long Term Goal 3: Patient will have 0 restrictions in her left wrist.  Problem List Patient  Active Problem List  Diagnoses  . ALCOHOL USE  . TOBACCO ABUSE  . POST TRAUMATIC STRESS SYNDROME  . DEPRESSION  . CARPAL TUNNEL SYNDROME  . INTRACRANIAL HEMORRHAGE  . Esophageal reflux  . INTUSSUSCEPTION  . ABDOMINAL ADHESIONS  . Cirrhosis of liver due to hepatitis C  . OBSTRUCTION OF BILE DUCT  . LOW BACK PAIN, CHRONIC  . ABDOMINAL BLOATING  . ABDOMINAL PAIN, CHRONIC  . ABDOMINAL PAIN OTHER SPECIFIED SITE  . HEPATITIS C, HX OF  . Muscle weakness (generalized)    End of Session Activity Tolerance: Patient tolerated treatment well General Behavior During Session: [facility] for tasks performed OT Plan of Care OT Home Exercise Plan: Educated patient on tendon glides and grip strengthening with pink tputty. Consulted and Agree with Plan of Care: Patient  GO  Functional Reporting Modifier  Current Status  [phone] - Carrying, Moving and Handling Objects CJ - At least 20% but less than 40% impaired, limited or restricted  Goal Status  [phone] - Carrying, Moving and Handling Objects CI - At least 1% but less than 20% impaired, limited or restricted   Shirlean Mylar, OTR/L  12/28/2011, 10:21 AM  Physician Documentation Your signature is required to indicate approval of the treatment plan as stated above.  Please sign and either send electronically or make a copy of this report for your files and return this physician signed original.  Please mark one 1.__approve of  plan  2. ___approve of plan with the following conditions.   ______________________________                                                          _____________________ Physician Signature                                                                                                             Date

## 2012-01-04 ENCOUNTER — Telehealth (HOSPITAL_COMMUNITY): Payer: Self-pay

## 2012-01-04 ENCOUNTER — Inpatient Hospital Stay (HOSPITAL_COMMUNITY): Admission: RE | Admit: 2012-01-04 | Payer: Medicare Other | Source: Ambulatory Visit | Admitting: Specialist

## 2012-01-06 ENCOUNTER — Ambulatory Visit (HOSPITAL_COMMUNITY): Payer: Medicare Other | Admitting: Physical Therapy

## 2012-01-06 ENCOUNTER — Ambulatory Visit (HOSPITAL_COMMUNITY): Payer: Medicare Other | Admitting: Specialist

## 2012-01-07 ENCOUNTER — Other Ambulatory Visit: Payer: Self-pay | Admitting: Gastroenterology

## 2012-01-11 ENCOUNTER — Telehealth (HOSPITAL_COMMUNITY): Payer: Self-pay

## 2012-01-11 ENCOUNTER — Ambulatory Visit (HOSPITAL_COMMUNITY): Payer: Medicare Other | Admitting: Physical Therapy

## 2012-01-11 ENCOUNTER — Ambulatory Visit (HOSPITAL_COMMUNITY): Payer: Medicare Other | Admitting: Occupational Therapy

## 2012-01-14 ENCOUNTER — Ambulatory Visit (HOSPITAL_COMMUNITY)
Admission: RE | Admit: 2012-01-14 | Discharge: 2012-01-14 | Disposition: A | Discharge status: Home / Self Care | Payer: Medicare Other | Source: Ambulatory Visit | Attending: Internal Medicine | Admitting: Internal Medicine

## 2012-01-14 ENCOUNTER — Ambulatory Visit (HOSPITAL_COMMUNITY)
Admission: RE | Admit: 2012-01-14 | Discharge: 2012-01-14 | Disposition: A | Discharge status: Home / Self Care | Payer: Medicare Other | Source: Ambulatory Visit | Attending: Specialist | Admitting: Specialist

## 2012-01-14 DIAGNOSIS — M6281 Muscle weakness (generalized): Secondary | ICD-10-CM

## 2012-01-14 NOTE — Progress Notes (Signed)
Occupational Therapy Treatment Patient Details  Name: Sabrina Wyatt MRN: [id number] Date of Birth: 1951/03/30  Today's Date: 01/14/2012 Time: 1478-2956 OT Time Calculation (min): 34 min Manual Therapy 2130-8657 18' Therapeutic Exercises 1049-1105 16' Visit#: 2  of 8   Re-eval: 01/25/12    Authorization: Medicare - will need to complete UEFI on 10th visit.  carrying goal  current CJ goals CI  Authorization Time Period:    Authorization Visit#: 2  of 10   Subjective Symptoms/Limitations Symptoms: S:  Ive been moving and giving this arm a workout. Pain Assessment Currently in Pain?: No/denies   Exercise/Treatments Wrist Exercises Wrist Flexion: AROM;10 reps Wrist Extension: AROM;10 reps   Sponges: left:  16 and 18, right 18, 18 Theraputty: Flatten;Roll;Grip Theraputty - Flatten: pink Theraputty - Roll: pink Theraputty - Grip: pink Hand Gripper with Large Beads: left hand x 5 Hand Gripper with Medium Beads: left hand x 5 Hand Gripper with Small Beads: left hand x 5, unable to do extra small  Hand Exercises Theraputty: Flatten;Roll;Grip Theraputty - Flatten: pink Theraputty - Roll: pink Theraputty - Grip: pink Hand Gripper with Large Beads: left hand x 5 Hand Gripper with Medium Beads: left hand x 5 Hand Gripper with Small Beads: left hand x 5, unable to do extra small Sponges: left:  16 and 18, right 18, 18     Manual Therapy Manual Therapy: Myofascial release Myofascial Release: MFR to left wrist and flexor and extensor forearm with gentle manual traction.  [id number]  Occupational Therapy Assessment and Plan OT Assessment and Plan Clinical Impression Statement: A:  Unable to grip xsmall beads with hand gripper due to decreased grip strength OT Plan: P:  Focus on small and extra small beads with grip strengthening.   Goals Short Term Goals Time to Complete Short Term Goals: 2 weeks Short Term Goal 1: Patient will be educated on a HEP. Short Term Goal 1  Progress: Progressing toward goal Short Term Goal 2: Patient will increase bilateral grip strength by 4 pounds and pinch strength by 1 pound for increased independence with opening containers. Short Term Goal 2 Progress: Progressing toward goal Short Term Goal 3: Patient will increase left wrist AROM by 5 for increased ability to push open doors. Short Term Goal 3 Progress: Progressing toward goal Short Term Goal 4: Patient will decrease fascial restrictions in her left wrist to trace. Short Term Goal 4 Progress: Progressing toward goal Long Term Goals Time to Complete Long Term Goals: 4 weeks Long Term Goal 1: Patient will return to prior level of I with all B/IADLs. Long Term Goal 1 Progress: Progressing toward goal Long Term Goal 2: Patient will increase bilateral grip strength by 8 pounds and pinch strength by 3 pounds for increased ability to open containers. Long Term Goal 2 Progress: Progressing toward goal Long Term Goal 3: Patient will have 0 restrictions in her left wrist. Long Term Goal 3 Progress: Progressing toward goal  Problem List Patient Active Problem List  Diagnoses  . ALCOHOL USE  . TOBACCO ABUSE  . POST TRAUMATIC STRESS SYNDROME  . DEPRESSION  . CARPAL TUNNEL SYNDROME  . INTRACRANIAL HEMORRHAGE  . Esophageal reflux  . INTUSSUSCEPTION  . ABDOMINAL ADHESIONS  . Cirrhosis of liver due to hepatitis C  . OBSTRUCTION OF BILE DUCT  . LOW BACK PAIN, CHRONIC  . ABDOMINAL BLOATING  . ABDOMINAL PAIN, CHRONIC  . ABDOMINAL PAIN OTHER SPECIFIED SITE  . HEPATITIS C, HX OF  . Muscle weakness (generalized)  End of Session Activity Tolerance: Patient tolerated treatment well General Behavior During Session: [facility] for tasks performed  GO No functional reporting required  Shirlean Mylar, OTR/L  01/14/2012, 11:56 AM

## 2012-01-14 NOTE — Evaluation (Signed)
Physical Therapy Re-Evaluation  Patient Details  Name: Sabrina Wyatt MRN: [id number] Date of Birth: May 20, 1951  Today's Date: 01/14/2012 Time: 0930-1018 PT Time Calculation (min): 48 min Charges: 1 re-evaluation, 25' TE, 10' Manual Visit#: 1  of 8   Re-eval: 02/13/12 Assessment Diagnosis: Chronic Abdominal and Lumbar pain  Authorization: MEDICARE  Authorization Time Period: Positioning: Current CJ, Goal: CK.  Based off of ODI percentage  Authorization Visit#: 1  of 10    Past Medical History:  Past Medical History  Diagnosis Date  . GERD (gastroesophageal reflux disease)   . Cirrhosis, hepatitis C     C  . Depression   . Carpal tunnel syndrome   . S/P endoscopy 08/27/10    antral erosions, otherwise normal, due egd 08/2012 to screen for varices  . Abdominal wall pain     chronic  . Pulmonary nodules   . PTSD (post-traumatic stress disorder)   . Chronic low back pain   . 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 Polysubstance abuse     HX of  . S/P colonoscopy 2005    Dr Arlie Solomons polyp removed, otherwise normal  . Hypertension   . Tobacco abuse    Past Surgical History:  Past Surgical History  Procedure Date  . Cholecystectomy 2002  . Percutaneous transhepatic cholangiograhpy and billiary drainage 2002  . Roux-en-y-hepatojejunostomy 2003  . Spinal fusion     C5-C7  . Carpal tunnel release 12/2010  . Esophagogastroduodenoscopy   09/10/2003    Small hiatal hernia; otherwise normal stomach, normal D1 and D2  . Colonoscopy   09/10/2003    diminutive polyp in the rectum cold biopsied/removed/ Normal colon  . Esophagogastroduodenoscopy 08/2010    Subjective Symptoms/Limitations Symptoms: Pt reports that she is moving and is very sore overall.  She states she is wearing her back brace during her move. She reports adherence with the inital HEP.  She has cut down on her smoking and is not smoking inside her house.  How long can you walk comfortably?: 45 minutes  causally. During the day she states she is winded just walking to and from her mailbox Pain Assessment Currently in Pain?: Yes Pain Score:   5 Pain Location: Back  Cognition/Observation Observation/Other Assessments Observations: significant scar to her RUQ around gallbladder removal region, with portal wounds present.  Scar from lumbar fusion.   Sensation/Coordination/Flexibility/Functional Tests Functional Tests Functional Tests: Oswestry Disability Index (ODI): 44% (was 54%)  Assessment RLE Strength RLE Overall Strength Comments: all WNL except Right Hip Extension: 4/5 Right Hip ABduction: 4/5 LLE Strength LLE Overall Strength Comments: improved coordination to R and L glutes medius, however continues to have difficulty with overall muscle strength after long holds and high reps.  Left Hip ABduction: 4/5 Lumbar Strength Overall Lumbar Strength Comments: Hodges Score for TrA: 8/10.  Stronger to the L side compared to R side.  Palpation Palpation: Pain and tenderness to RUQ, R hip flexor region and R low back w/moderate muscle spasms.  Decreased PA mobility to L2 SP and TP on R and L  Exercise/Treatments Stretches Active Hamstring Stretch: 3 reps;30 seconds;Other (comment) (BLE) Supine Bent Knee Raise: 15 reps (BLE) Bridge: 15 reps;Other (comment) (10 sec holds) Other Supine Lumbar Exercises: Pelvic Floor Contractions 10x10 sec holds Sidelying Clam: 10 reps;Other (comment) (10 sec holds BLE) Prone Other Exercise: Multifidus anterior tilt 10x10 sec holds  Manual Therapy Manual Therapy: Myofascial release Myofascial Release: to RUE abd region over scar tissue and to lumbar  spine to decrease fascial restrictions, improve scar mobility and decrease pain w/STM after  Physical Therapy Assessment and Plan PT Assessment and Plan Clinical Impression Statement: Re-evaluation for pt to continue with OP PT sevices complete. Sabrina Wyatt has returned to PT w/a new order for PT for  abdominal and LBP after compliance issues.  She was seen initally in April for an eval of the same condition.  Today re-eval complete and exercises given.   Pt will benefit from skilled therapeutic intervention in order to improve on the following deficits: Pain;Impaired perceived functional ability;Increased fascial restricitons;Decreased coordination;Decreased strength Rehab Potential: Good PT Frequency: Min 2X/week PT Duration: 4 weeks PT Treatment/Interventions: Therapeutic activities;Therapeutic exercise;Balance training;Neuromuscular re-education;Patient/family education;Other (comment) (Manual techniques ) PT Plan: Pt has a TENS unit at home, manual therapy to abdominal region (over scars) and to low back region.  Continue with low back stabilization exercises.     Goals Home Exercise Program Pt will Perform Home Exercise Program: Independently PT Goal: Perform Home Exercise Program - Progress: Met PT Short Term Goals Time to Complete Short Term Goals: 2 weeks PT Short Term Goal 1: Pt will report pain less than 5/10 for 50% of her day. PT Short Term Goal 1 - Progress: Progressing toward goal PT Short Term Goal 2: Pt will improve her balance and demonstrate tandem stance with appropriate hip and ankle strategy for 30 sec on static surface. PT Short Term Goal 2 - Progress: Progressing toward goal PT Short Term Goal 3: Pt will improve her muscular endurance and report walking 15 minutes for excercise without SOB or an increase in pain.  PT Short Term Goal 3 - Progress: Progressing toward goal PT Short Term Goal 4: Pt will be educated on an appropriate walking program for her.  PT Short Term Goal 4 - Progress: Not met PT Short Term Goal 5: Pt will improve her TrA muscular endurance to a 10/10 in the seated position.  PT Short Term Goal 5 - Progress: Progressing toward goal PT Long Term Goals Time to Complete Long Term Goals: 4 weeks PT Long Term Goal 1: Pt will report pain less than a  4/10 for 75% of her day for improved QOL.  PT Long Term Goal 1 - Progress: Progressing toward goal PT Long Term Goal 2: Pt will score at least a 40/80 on her LEFS and lower than 34% on her ODI for improved percieved functional ability.  PT Long Term Goal 2 - Progress: Revised (modified due to lack of progress/goal met) (Inital goal ODI: 44%, Current 44%; changed to 34%) Long Term Goal 3: Pt will report walking for 2 miles without an increase in pain to her abdominal or low back region.  Long Term Goal 3 Progress: Progressing toward goal Long Term Goal 4: Pt will independently demonstrate exercises activities on a ball in order to return to enjoyable workout activities.  Long Term Goal 4 Progress: Progressing toward goal  Problem List Patient Active Problem List  Diagnoses  . ALCOHOL USE  . TOBACCO ABUSE  . POST TRAUMATIC STRESS SYNDROME  . DEPRESSION  . CARPAL TUNNEL SYNDROME  . INTRACRANIAL HEMORRHAGE  . Esophageal reflux  . INTUSSUSCEPTION  . ABDOMINAL ADHESIONS  . Cirrhosis of liver due to hepatitis C  . OBSTRUCTION OF BILE DUCT  . LOW BACK PAIN, CHRONIC  . ABDOMINAL BLOATING  . ABDOMINAL PAIN, CHRONIC  . ABDOMINAL PAIN OTHER SPECIFIED SITE  . HEPATITIS C, HX OF  . Muscle weakness (generalized)    PT -  End of Session Activity Tolerance: Patient tolerated treatment well  GP  Functional Reporting Modifier  Current Status  [phone] - Changing & Maintaing Body Position CK - At least 40% but less than 60% impaired, limited or restricted  Goal Status  [id number] - Changing & Maintaing Body Position CJ - At least 20% but less than 40% impaired, limited or restricted   Ashlie Mcmenamy 01/14/2012, 12:02 PM  Physician Documentation Your signature is required to indicate approval of the treatment plan as stated above.  Please sign and either send electronically or make a copy of this report for your files and return this physician signed original.   Please mark one 1.__approve of plan  2.  ___approve of plan with the following conditions.   ______________________________                                                          _____________________ Physician Signature                                                                                                             Date

## 2012-01-24 ENCOUNTER — Ambulatory Visit (HOSPITAL_COMMUNITY): Payer: Medicare Other | Admitting: Specialist

## 2012-01-26 ENCOUNTER — Ambulatory Visit (HOSPITAL_COMMUNITY): Payer: Medicare Other | Admitting: Specialist

## 2012-02-01 ENCOUNTER — Ambulatory Visit (HOSPITAL_COMMUNITY): Payer: Medicare Other | Admitting: Physical Therapy

## 2012-02-03 ENCOUNTER — Inpatient Hospital Stay (HOSPITAL_COMMUNITY): Admission: RE | Admit: 2012-02-03 | Payer: Medicare Other | Source: Ambulatory Visit | Admitting: Physical Therapy

## 2012-02-03 ENCOUNTER — Telehealth (HOSPITAL_COMMUNITY): Payer: Self-pay

## 2012-02-08 ENCOUNTER — Ambulatory Visit (HOSPITAL_COMMUNITY): Payer: Medicare Other | Admitting: Physical Therapy

## 2012-02-10 ENCOUNTER — Ambulatory Visit (HOSPITAL_COMMUNITY): Payer: Medicare Other | Admitting: Specialist

## 2012-02-13 ENCOUNTER — Emergency Department (HOSPITAL_COMMUNITY)
Admission: EM | Admit: 2012-02-13 | Discharge: 2012-02-13 | Disposition: A | Discharge status: Home / Self Care | Payer: Medicare Other | Attending: Emergency Medicine | Admitting: Emergency Medicine

## 2012-02-13 ENCOUNTER — Encounter (HOSPITAL_COMMUNITY): Payer: Self-pay | Admitting: Emergency Medicine

## 2012-02-13 DIAGNOSIS — F4310 Post-traumatic stress disorder, unspecified: Secondary | ICD-10-CM | POA: Insufficient documentation

## 2012-02-13 DIAGNOSIS — I10 Essential (primary) hypertension: Secondary | ICD-10-CM | POA: Insufficient documentation

## 2012-02-13 DIAGNOSIS — R51 Headache: Secondary | ICD-10-CM | POA: Insufficient documentation

## 2012-02-13 DIAGNOSIS — Z7729 Contact with and (suspected ) exposure to other hazardous substances: Secondary | ICD-10-CM

## 2012-02-13 DIAGNOSIS — K219 Gastro-esophageal reflux disease without esophagitis: Secondary | ICD-10-CM | POA: Insufficient documentation

## 2012-02-13 DIAGNOSIS — G8929 Other chronic pain: Secondary | ICD-10-CM | POA: Insufficient documentation

## 2012-02-13 DIAGNOSIS — B1920 Unspecified viral hepatitis C without hepatic coma: Secondary | ICD-10-CM | POA: Insufficient documentation

## 2012-02-13 DIAGNOSIS — F17200 Nicotine dependence, unspecified, uncomplicated: Secondary | ICD-10-CM | POA: Insufficient documentation

## 2012-02-13 LAB — DIFFERENTIAL
Basophils Absolute: 0.1 10*3/uL (ref 0.0–0.1)
Basophils Relative: 1 % (ref 0–1)
Eosinophils Absolute: 0.2 10*3/uL (ref 0.0–0.7)
Eosinophils Relative: 2 % (ref 0–5)
Lymphocytes Relative: 33 % (ref 12–46)
Lymphs Abs: 3.3 10*3/uL (ref 0.7–4.0)
Monocytes Absolute: 0.7 10*3/uL (ref 0.1–1.0)
Monocytes Relative: 7 % (ref 3–12)
Neutro Abs: 5.6 10*3/uL (ref 1.7–7.7)
Neutrophils Relative %: 57 % (ref 43–77)

## 2012-02-13 LAB — CARBOXYHEMOGLOBIN
Carboxyhemoglobin: 5.1 % (ref 0.5–1.5)
Methemoglobin: 1.1 % (ref 0.0–1.5)
O2 Saturation: 84.7 %
Total hemoglobin: 15.5 g/dL (ref 12.5–16.0)
Total oxygen content: 17.3 mL/dL (ref 15.0–23.0)

## 2012-02-13 LAB — BASIC METABOLIC PANEL
BUN: 13 mg/dL (ref 6–23)
CO2: 27 mEq/L (ref 19–32)
Calcium: 9.9 mg/dL (ref 8.4–10.5)
Chloride: 99 mEq/L (ref 96–112)
Creatinine, Ser: 0.65 mg/dL (ref 0.50–1.10)
GFR calc Af Amer: >90 mL/min (ref >90)
GFR calc non Af Amer: >90 mL/min (ref >90)
Glucose, Bld: 94 mg/dL (ref 70–99)
Potassium: 3.8 mEq/L (ref 3.5–5.1)
Sodium: 135 mEq/L (ref 135–145)

## 2012-02-13 LAB — CBC
HCT: 46.1 % — ABNORMAL HIGH (ref 36.0–46.0)
Hemoglobin: 15.4 g/dL — ABNORMAL HIGH (ref 12.0–15.0)
MCH: 32.1 pg (ref 26.0–34.0)
MCHC: 33.4 g/dL (ref 30.0–36.0)
MCV: 96 fL (ref 78.0–100.0)
Platelets: 165 10*3/uL (ref 150–400)
RBC: 4.8 MIL/uL (ref 3.87–5.11)
RDW: 12.6 % (ref 11.5–15.5)
WBC: 9.8 10*3/uL (ref 4.0–10.5)

## 2012-02-13 MED ORDER — FENTANYL CITRATE 0.05 MG/ML IJ SOLN
50.0000 ug | Freq: Once | INTRAMUSCULAR | Status: AC
  Administered 2012-02-13: 50 ug via NASAL
  Filled 2012-02-13: qty 2

## 2012-02-13 MED ORDER — OXYCODONE-ACETAMINOPHEN 5-325 MG PO TABS
2.0000 | ORAL_TABLET | Freq: Once | ORAL | Status: AC
  Administered 2012-02-13: 2 via ORAL
  Filled 2012-02-13: qty 2

## 2012-02-13 MED ORDER — ONDANSETRON 8 MG PO TBDP
8.0000 mg | ORAL_TABLET | Freq: Once | ORAL | Status: AC
  Administered 2012-02-13: 8 mg via ORAL
  Filled 2012-02-13: qty 1

## 2012-02-13 NOTE — ED Notes (Signed)
CRITICAL VALUE ALERT  Critical value received: FO2 84%, Carboxyhemoglobin 5.1  Date of notification: 02/13/2012  Time of notification: 5:23pm  Critical value read back:yes  Nurse who received alert:  Micheal Likens, RN  MD notified (1st page):  17:25pm told verbally  Time of first page:  17:25pm told verbally  MD notified (2nd page):n/a  Time of second page:n/a  Responding MD:Dr. Fonnie Jarvis  Time MD responded:  5:25pm

## 2012-02-13 NOTE — Discharge Instructions (Signed)
You are having a headache. No specific cause was found today for your headache. It may have been a migraine or other cause of headache. Stress, anxiety, fatigue, and depression are common triggers for headaches. Your headache today does not appear to be life-threatening or require hospitalization, but often the exact cause of headaches is not determined in the emergency department. Therefore, follow-up with your doctor is very important to find out what may have caused your headache, and whether or not you need any further diagnostic testing or treatment. Sometimes headaches can appear benign (not harmful), but then more serious symptoms can develop which should prompt an immediate re-evaluation by your doctor or the emergency department. SEEK MEDICAL ATTENTION IF: You develop possible problems with medications prescribed.  The medications don't resolve your headache, if it recurs , or if you have multiple episodes of vomiting or can't take fluids. You have a change from the usual headache. RETURN IMMEDIATELY IF you develop a sudden, severe headache or confusion, become poorly responsive or faint, develop a fever above 100.48F or problem breathing, have a change in speech, vision, swallowing, or understanding, or develop new weakness, numbness, tingling, incoordination, or have a seizure.  Have your house checked again to assure no carbon monoxide leak before returning home.

## 2012-02-13 NOTE — ED Provider Notes (Signed)
History   Scribed for No att. providers found, the patient was seen in [facility]. The chart was scribed by Gilman Schmidt. The patients care was started at 1:29 AM.   CSN: [id number]  Arrival date & time 02/13/12  1534   First MD Initiated Contact with Patient 02/13/12 1625      Chief Complaint  Patient presents with  . Toxic Inhalation    (Consider location/radiation/quality/duration/timing/severity/associated sxs/prior treatment) HPI Sabrina Wyatt is a 61 y.o. female with multiple illnesses including chronic abdominal and back pain, GERD, Depression, PTSD, and Poly Substance Abuse who presents to the Emergency Department complaining of possible toxic inhalation to carbon monoxide. Reports headache, nausea, and dizziness. Notes that carbon monoxide detector has been going off daily at her home for a few weeks.Reports that symptoms occurred one week ago when carbon monoxide detector went off.  States she spent night at friends house last night. Notes she returned home today and experienced same sx. Pt also reports vertigo. Denies any chest pain, vomiting, weakness, change in speaking or swallowing. There are no other associated symptoms and no other alleviating or aggravating factors.    Past Medical History  Diagnosis Date  . GERD (gastroesophageal reflux disease)   . Cirrhosis, hepatitis C     C  . Depression   . Carpal tunnel syndrome   . S/P endoscopy 08/27/10    antral erosions, otherwise normal, due egd 08/2012 to screen for varices  . Abdominal wall pain     chronic  . Pulmonary nodules   . PTSD (post-traumatic stress disorder)   . Chronic low back pain   . 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 Polysubstance abuse     HX of  . S/P colonoscopy 2005    Dr Arlie Solomons polyp removed, otherwise normal  . Hypertension   . Tobacco abuse     Past Surgical History  Procedure Date  . Cholecystectomy 2002  . Percutaneous transhepatic cholangiograhpy and billiary  drainage 2002  . Roux-en-y-hepatojejunostomy 2003  . Spinal fusion     C5-C7  . Carpal tunnel release 12/2010  . Esophagogastroduodenoscopy   09/10/2003    Small hiatal hernia; otherwise normal stomach, normal D1 and D2  . Colonoscopy   09/10/2003    diminutive polyp in the rectum cold biopsied/removed/ Normal colon  . Esophagogastroduodenoscopy 08/2010    Family History  Problem Relation Age of Onset  . Coronary artery disease Brother   . Breast cancer Sister     History  Substance Use Topics  . Smoking status: Current Everyday Smoker -- 1.0 packs/day for 30 years    Types: Cigarettes  . Smokeless tobacco: Not on file   Comment: 3 cigs per day now, trying to quit  . Alcohol Use: No    OB History    Grav Para Term Preterm Abortions TAB SAB Ect Mult Living                  Review of Systems  Constitutional: Negative for fever.       10 Systems reviewed and are negative for acute change except as noted in the HPI.  HENT: Negative for rhinorrhea.   Eyes: Negative for discharge and redness.  Respiratory: Negative for cough and shortness of breath.   Cardiovascular: Negative for chest pain.  Gastrointestinal: Positive for nausea. Negative for vomiting, abdominal pain and diarrhea.  Genitourinary: Negative for dysuria.  Musculoskeletal: Negative for back pain.  Skin: Negative for rash.  Neurological:  Positive for dizziness and headaches. Negative for syncope, speech difficulty, weakness and numbness.       Vertigo   Psychiatric/Behavioral: Negative for suicidal ideas, hallucinations and confusion.       No behavior change.  All other systems reviewed and are negative.    Allergies  Review of patient's allergies indicates no known allergies.  Home Medications   Current Outpatient Rx  Name Route Sig Dispense Refill  . VITAMIN C 100 MG PO TABS Oral Take 100 mg by mouth daily.      . ATENOLOL 25 MG PO TABS Oral Take 25 mg by mouth daily.      Marland Kitchen CALCIUM  CARBONATE-VITAMIN D 600-400 MG-UNIT PO TABS Oral Take 1 tablet by mouth daily.      . DULOXETINE HCL 60 MG PO CPEP Oral Take 60 mg by mouth daily.      . ETODOLAC 200 MG PO CAPS Oral Take 200 mg by mouth every 8 (eight) hours.    . OMEGA-3 FATTY ACIDS 1000 MG PO CAPS Oral Take 2 g by mouth daily.      Marland Kitchen FLUTICASONE PROPIONATE 50 MCG/ACT NA SUSP Nasal Place 2 sprays into the nose daily.      Marland Kitchen LIDOCAINE 5 % EX PTCH Transdermal Place 1 patch onto the skin daily. Remove & Discard patch within 12 hours or as directed by MD    . OMEPRAZOLE 20 MG PO CPDR  TAKE ONE CAPSULE BY MOUTH EVERY DAY 30 capsule 11  . OXYCODONE HCL 5 MG PO TABS Oral Take 5 mg by mouth every 4 (four) hours as needed. Decreasing dose       BP 151/83  Pulse 76  Temp 98.9 F (37.2 C) (Oral)  Resp 20  Ht 5\' 5"  (1.651 m)  Wt 142 lb (64.411 kg)  BMI 23.63 kg/m2  SpO2 100%  Physical Exam  Nursing note and vitals reviewed. Constitutional:       Awake, alert, nontoxic appearance with baseline speech for patient.  HENT:  Head: Atraumatic.  Mouth/Throat: No oropharyngeal exudate.       No nystagmus  No dysconjugate gaze  Eyes: EOM are normal. Pupils are equal, round, and reactive to light. Right eye exhibits no discharge. Left eye exhibits no discharge.  Neck: Neck supple.  Cardiovascular: Normal rate and regular rhythm.   No murmur heard. Pulmonary/Chest: Effort normal and breath sounds normal. No stridor. No respiratory distress. She has no wheezes. She has no rales. She exhibits no tenderness.  Abdominal: Soft. Bowel sounds are normal. She exhibits no mass. There is no tenderness. There is no rebound.  Musculoskeletal: She exhibits no tenderness.       Baseline ROM, moves extremities with no obvious new focal weakness.  Lymphadenopathy:    She has no cervical adenopathy.  Neurological:       Awake, alert, cooperative and aware of situation; motor strength bilaterally; sensation normal to light touch bilaterally;  peripheral visual fields full to confrontation; no facial asymmetry; tongue midline; major cranial nerves appear intact; no pronator drift, normal finger to nose bilaterally, baseline gait without new ataxia.  Skin: No rash noted.  Psychiatric: She has a normal mood and affect.    ED Course  Procedures (including critical care time)  Labs Reviewed  CBC - Abnormal; Notable for the following:    Hemoglobin 15.4 (*)     HCT 46.1 (*)     All other components within normal limits  CARBOXYHEMOGLOBIN - Abnormal; Notable for the  following:    Carboxyhemoglobin 5.1 (*)     All other components within normal limits  BASIC METABOLIC PANEL  DIFFERENTIAL  LAB REPORT - SCANNED   No results found.   1. Headache   2. Carbon Monoxide Exposure     DIAGNOSTIC STUDIES: Oxygen Saturation is 100% on room air, normal by my interpretation.    LABS Results for orders placed during the hospital encounter of 02/13/12  BASIC METABOLIC PANEL      Component Value Range   Sodium 135  135 - 145 mEq/L   Potassium 3.8  3.5 - 5.1 mEq/L   Chloride 99  96 - 112 mEq/L   CO2 27  19 - 32 mEq/L   Glucose, Bld 94  70 - 99 mg/dL   BUN 13  6 - 23 mg/dL   Creatinine, Ser 1.91  0.50 - 1.10 mg/dL   Calcium 9.9  8.4 - 47.8 mg/dL   GFR calc non Af Amer >90  >90 mL/min   GFR calc Af Amer >90  >90 mL/min  CBC      Component Value Range   WBC 9.8  4.0 - 10.5 K/uL   RBC 4.80  3.87 - 5.11 MIL/uL   Hemoglobin 15.4 (*) 12.0 - 15.0 g/dL   HCT 29.5 (*) 62.1 - 30.8 %   MCV 96.0  78.0 - 100.0 fL   MCH 32.1  26.0 - 34.0 pg   MCHC 33.4  30.0 - 36.0 g/dL   RDW 65.7  84.6 - 96.2 %   Platelets 165  150 - 400 K/uL  DIFFERENTIAL      Component Value Range   Neutrophils Relative 57  43 - 77 %   Neutro Abs 5.6  1.7 - 7.7 K/uL   Lymphocytes Relative 33  12 - 46 %   Lymphs Abs 3.3  0.7 - 4.0 K/uL   Monocytes Relative 7  3 - 12 %   Monocytes Absolute 0.7  0.1 - 1.0 K/uL   Eosinophils Relative 2  0 - 5 %   Eosinophils  Absolute 0.2  0.0 - 0.7 K/uL   Basophils Relative 1  0 - 1 %   Basophils Absolute 0.1  0.0 - 0.1 K/uL  CARBOXYHEMOGLOBIN      Component Value Range   Total hemoglobin 15.5  12.5 - 16.0 g/dL   O2 Saturation 95.2     Carboxyhemoglobin 5.1 (*) 0.5 - 1.5 %   Methemoglobin 1.1  0.0 - 1.5 %   Total oxygen content 17.3  15.0 - 23.0 mL/dL     COORDINATION OF CARE: 4:28pm:  - Patient evaluated by ED physician, Sublimaze, Zofran, BMP, CBC, Diff, Carboxyhemoglobin ordered      MDM  I personally performed the services described in this documentation, which was scribed in my presence. The recorded information has been reviewed and considered.  Pt stable in ED with no significant deterioration in condition.Patient / Family / Caregiver informed of clinical course, understand medical decision-making process, and agree with plan.       Hurman Horn, MD 02/17/12 [phone]

## 2012-02-13 NOTE — ED Notes (Signed)
Patient states that her carbon monoxide detector has been going off at night and when she wakes up in the morning she is dizzy, has a headache, and feels pressure on her face. She also states that she has woke up nauseated. Patient states that she had the gas company come out to her trailer previously to check things out but nothing was found. Breathing pattern is regular and unlabored with no signs of distress noted at present.

## 2012-02-13 NOTE — ED Notes (Signed)
Pt here for possible exposure to carbon monoxide-c/o ha/nausea/dizziness. States carbon monoxide detector has been going off daily at her home.

## 2012-02-15 ENCOUNTER — Ambulatory Visit (HOSPITAL_COMMUNITY): Payer: Medicare Other | Admitting: Physical Therapy

## 2012-02-17 ENCOUNTER — Ambulatory Visit (HOSPITAL_COMMUNITY): Payer: Medicare Other | Admitting: Physical Therapy

## 2012-03-07 ENCOUNTER — Other Ambulatory Visit: Payer: Self-pay | Admitting: Obstetrics and Gynecology

## 2012-03-07 DIAGNOSIS — Z139 Encounter for screening, unspecified: Secondary | ICD-10-CM

## 2012-04-17 ENCOUNTER — Ambulatory Visit (HOSPITAL_COMMUNITY): Payer: Medicare Other

## 2012-04-23 DIAGNOSIS — K561 Intussusception: Secondary | ICD-10-CM

## 2012-04-23 HISTORY — DX: Intussusception: K56.1

## 2012-05-01 ENCOUNTER — Encounter: Payer: Self-pay | Admitting: Internal Medicine

## 2012-05-02 ENCOUNTER — Ambulatory Visit (INDEPENDENT_AMBULATORY_CARE_PROVIDER_SITE_OTHER): Payer: 59 | Admitting: Internal Medicine

## 2012-05-02 ENCOUNTER — Encounter: Payer: Self-pay | Admitting: Internal Medicine

## 2012-05-02 VITALS — BP 134/87 | HR 89 | Temp 97.4°F | Ht 66.0 in | Wt 144.0 lb

## 2012-05-02 DIAGNOSIS — R1013 Epigastric pain: Secondary | ICD-10-CM

## 2012-05-02 DIAGNOSIS — R1900 Intra-abdominal and pelvic swelling, mass and lump, unspecified site: Secondary | ICD-10-CM

## 2012-05-02 DIAGNOSIS — K7460 Unspecified cirrhosis of liver: Secondary | ICD-10-CM

## 2012-05-02 NOTE — Patient Instructions (Addendum)
afp now; repeat afp and liver ultrasound every 6 months  Ct of abdomen and pelvis w contrast to evaluate epigastric mass  Plan for screening colonoscopy and EGD in 2015

## 2012-05-02 NOTE — Progress Notes (Signed)
Primary Care Physician:  Avon Gully, MD Primary Gastroenterologist:  Dr. Jena Gauss  Pre-Procedure History & Physical: HPI:  Sabrina Wyatt is a 61 y.o. 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 Chronic abdominal pain sees Dr. Norville Haggard with the [facility] in [place] for this problem. Cirrhotic liver on ultrasound in May of this year. She's overdue for an alpha-fetoprotein-last one being one year ago. She'll be due for an EGD for variceal screening in 2015. Likewise, she'll be due for average risk colorectal cancer screening at that time. Not having a melena or rectal bleeding. History of benign lung lesion on serial CTs-no further workup warranted.. For sleep she continues to smoke. Past Medical History  Diagnosis Date  . GERD (gastroesophageal reflux disease)   . Cirrhosis, hepatitis C     C  . Depression   . Carpal tunnel syndrome   . S/P endoscopy 08/27/10    antral erosions, otherwise normal, due egd 08/2012 to screen for varices  . Abdominal wall pain     chronic  . Pulmonary nodules   . PTSD (post-traumatic stress disorder)   . Chronic low back pain   . 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 Polysubstance abuse     HX of  . S/P colonoscopy 2005    Dr Arlie Solomons polyp removed, otherwise normal  . Hypertension   . Tobacco abuse     Past Surgical History  Procedure Date  . Cholecystectomy 2002  . Percutaneous transhepatic cholangiograhpy and billiary drainage 2002  . Roux-en-y-hepatojejunostomy 2003  . Spinal fusion     C5-C7  . Carpal tunnel release 12/2010  . Esophagogastroduodenoscopy   09/10/2003    Small hiatal hernia; otherwise normal stomach, normal D1 and D2  . Colonoscopy   09/10/2003    diminutive polyp in the rectum cold biopsied/removed/ Normal colon  . Esophagogastroduodenoscopy 08/27/2010    Normal esophagus.  No varices.  Couple of tiny antral erosions of doubtful clinical significance,  otherwise normal stomach, D1 and D2.    Prior to Admission medications   Medication Sig Start Date End Date Taking? Authorizing Provider  Ascorbic Acid (VITAMIN C) 100 MG tablet Take 100 mg by mouth daily.     Yes Historical Provider, MD  atenolol (TENORMIN) 25 MG tablet Take 25 mg by mouth daily.     Yes Historical Provider, MD  Cholecalciferol (VITAMIN D-3) 1000 UNITS CAPS Take 1,000 Units by mouth daily.   Yes Historical Provider, MD  DULoxetine (CYMBALTA) 60 MG capsule Take 60 mg by mouth daily.     Yes Historical Provider, MD  etodolac (LODINE) 400 MG tablet Take 400 mg by mouth daily.  04/13/12  Yes Historical Provider, MD  fish oil-omega-3 fatty acids 1000 MG capsule Take 2 g by mouth daily.     Yes Historical Provider, MD  fluticasone (FLONASE) 50 MCG/ACT nasal spray Place 2 sprays into the nose daily.     Yes Historical Provider, MD  lidocaine (LIDODERM) 5 % Place 1 patch onto the skin daily. Remove & Discard patch within 12 hours or as directed by MD   Yes Historical Provider, MD  loratadine (CLARITIN) 10 MG tablet Take 10 mg by mouth daily.   Yes Historical Provider, MD  omeprazole (PRILOSEC) 20 MG capsule TAKE ONE CAPSULE BY MOUTH EVERY DAY 08/26/11  Yes Tiffany Kocher, PA  oxyCODONE (OXY IR/ROXICODONE) 5 MG immediate release tablet Take 5 mg by mouth every 4 (four) hours as  needed. Decreasing dose    Yes Historical Provider, MD  PROAIR HFA 108 (90 BASE) MCG/ACT inhaler Inhale 1 puff into the lungs every 6 (six) hours as needed.  02/21/12  Yes Historical Provider, MD  UNABLE TO FIND Herbal liver support 950 mg daily   Yes Historical Provider, MD  Calcium Carbonate-Vitamin D (CALTRATE 600+D) 600-400 MG-UNIT per tablet Take 1 tablet by mouth daily.      Historical Provider, MD    Allergies as of 05/02/2012  . (No Known Allergies)    Family History  Problem Relation Age of Onset  . Coronary artery disease Brother   . Breast cancer Sister     History   Social History  . Marital  Status: Single    Spouse Name: N/A    Number of Children: 1  . Years of Education: N/A   Occupational History  . disabled    Social History Main Topics  . Smoking status: Current Some Day Smoker -- 1.0 packs/day for 30 years    Types: Cigarettes  . Smokeless tobacco: Not on file   Comment: 3 cigs per day now, trying to quit  . Alcohol Use: No  . Drug Use: No     remote in teens-marijuana, heroin, cocaine  . Sexually Active: No   Other Topics Concern  . Not on file   Social History Narrative  . No narrative on file    Review of Systems: See HPI, otherwise negative ROS  Physical Exam: BP 134/87  Pulse 89  Temp 97.4 F (36.3 C) (Temporal)  Ht 5\' 6"  (1.676 m)  Wt 144 lb (65.318 kg)  BMI 23.24 kg/m2 General:   Disheveled appearing lady pleasant alert conversant in no acute distress.  Skin:  Numerous tattoos  Eyes:  Sclera clear, no icterus.   Conjunctiva pink. Ears:  Normal auditory acuity. Nose:  No deformity, discharge,  or lesions. Mouth:  No deformity or lesions. Neck:  Supple; no masses or thyromegaly. No significant cervical adenopathy. Lungs:  Clear throughout to auscultation.   No wheezes, crackles, or rhonchi. No acute distress. Heart:  Regular rate and rhythm; no murmurs, clicks, rubs,  or gallops. Abdomen: Full. Multiple surgical scars. Positive bowel sounds. She has a golf ball size mass at the medial aspect of epigastric scar. It is tender to palpation. This was not noted during her March office visit here. Patient also has noticed it recently.  Pulses:  Normal pulses noted. Extremities:  Without clubbing or edema.  Impression/Plan:  HCV / cirrhosis appears stable. Chronic pain syndrome managed elsewhere. New abdominal mass which may be a hernia or some other process. She needs a CT to further evaluate.  She will need an EGD for screening as well as screening colonoscopy in 2015. She needs and alpha-fetoprotein at this time. Subsequently, she will need to  AFPs and ultrasounds every 6 months. Hepatitis A and B. immune status not readily apparent in the record-testes to be researched.  Contrast CT abdomen pelvis in the near future. Serum alpha-fetoprotein today. Further recommendations to follow very near future.  Determine hepatitis a and B. immune status.

## 2012-05-05 LAB — AFP TUMOR MARKER: AFP-Tumor Marker: 3.9 ng/mL (ref 0.0–8.0)

## 2012-05-08 ENCOUNTER — Other Ambulatory Visit: Payer: Self-pay

## 2012-05-08 ENCOUNTER — Ambulatory Visit (HOSPITAL_COMMUNITY)
Admission: RE | Admit: 2012-05-08 | Discharge: 2012-05-08 | Disposition: A | Discharge status: Home / Self Care | Payer: PRIVATE HEALTH INSURANCE | Source: Ambulatory Visit | Attending: Obstetrics and Gynecology | Admitting: Obstetrics and Gynecology

## 2012-05-08 DIAGNOSIS — Z139 Encounter for screening, unspecified: Secondary | ICD-10-CM

## 2012-05-08 DIAGNOSIS — Z1231 Encounter for screening mammogram for malignant neoplasm of breast: Secondary | ICD-10-CM | POA: Insufficient documentation

## 2012-05-08 DIAGNOSIS — K7460 Unspecified cirrhosis of liver: Secondary | ICD-10-CM

## 2012-05-10 ENCOUNTER — Encounter: Payer: Self-pay | Admitting: Internal Medicine

## 2012-05-10 ENCOUNTER — Ambulatory Visit (HOSPITAL_COMMUNITY)
Admission: RE | Admit: 2012-05-10 | Discharge: 2012-05-10 | Disposition: A | Discharge status: Home / Self Care | Payer: PRIVATE HEALTH INSURANCE | Source: Ambulatory Visit | Attending: Internal Medicine | Admitting: Internal Medicine

## 2012-05-10 ENCOUNTER — Other Ambulatory Visit (HOSPITAL_COMMUNITY): Payer: Self-pay | Admitting: Internal Medicine

## 2012-05-10 DIAGNOSIS — R1013 Epigastric pain: Secondary | ICD-10-CM | POA: Insufficient documentation

## 2012-05-10 DIAGNOSIS — Z934 Other artificial openings of gastrointestinal tract status: Secondary | ICD-10-CM | POA: Insufficient documentation

## 2012-05-10 DIAGNOSIS — J40 Bronchitis, not specified as acute or chronic: Secondary | ICD-10-CM

## 2012-05-10 DIAGNOSIS — K561 Intussusception: Secondary | ICD-10-CM | POA: Insufficient documentation

## 2012-05-10 DIAGNOSIS — R109 Unspecified abdominal pain: Secondary | ICD-10-CM | POA: Insufficient documentation

## 2012-05-10 DIAGNOSIS — I10 Essential (primary) hypertension: Secondary | ICD-10-CM | POA: Insufficient documentation

## 2012-05-10 MED ORDER — IOHEXOL 300 MG/ML  SOLN
100.0000 mL | Freq: Once | INTRAMUSCULAR | Status: AC | PRN
  Administered 2012-05-10: 100 mL via INTRAVENOUS

## 2012-05-10 NOTE — Progress Notes (Signed)
Patient ID: Sabrina Wyatt, female   DOB: October 13, 1950, 61 y.o.   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 About 2.6 cm in length. This likely correlates to the golf ball size mass found on her physical exam recently. She needs to see a GI Careers adviser. I suggest we make her an appointment to see one a GI surgeons over at [facility]. Given her complicated history I feel she would be best served at a tertiary referral center rather than seeing one of the general surgeons locally. Please make an appointment ASAP to see one of the [facility] surgeons and let her know. If she develops nausea vomiting or acute abdominal pain she should present to the emergency department.

## 2012-05-12 NOTE — Progress Notes (Signed)
Pt is aware and it is ok to set up referral to [facility], please send referral.

## 2012-05-15 ENCOUNTER — Telehealth: Payer: Self-pay | Admitting: Internal Medicine

## 2012-05-15 NOTE — Progress Notes (Signed)
Patient is scheduled with Dr. Merri Brunette at [facility] on October 14th at 11:30am and patient is aware

## 2012-05-15 NOTE — Telephone Encounter (Signed)
Patient has been Rescheduled with Dr. Chestine Spore on 9/30 at 11:15 and patient is aware

## 2012-05-22 DIAGNOSIS — B1920 Unspecified viral hepatitis C without hepatic coma: Secondary | ICD-10-CM | POA: Insufficient documentation

## 2012-05-22 DIAGNOSIS — G8929 Other chronic pain: Secondary | ICD-10-CM | POA: Insufficient documentation

## 2012-06-07 ENCOUNTER — Telehealth: Payer: Self-pay | Admitting: Internal Medicine

## 2012-06-07 NOTE — Telephone Encounter (Signed)
Dr. Sherlynn Stalls from [facility] called regarding Sabrina Wyatt. He is a Secretary/administrator. An MRE done over there demonstrated an abnormal Roux anastomosis likely predisposing to intussusception. He has offered her an operation to resect this area in the hopes of alleviating her symptoms. Surgery planned in the near future.

## 2012-06-09 DIAGNOSIS — Z8739 Personal history of other diseases of the musculoskeletal system and connective tissue: Secondary | ICD-10-CM | POA: Insufficient documentation

## 2012-06-09 DIAGNOSIS — I10 Essential (primary) hypertension: Secondary | ICD-10-CM | POA: Insufficient documentation

## 2012-06-09 DIAGNOSIS — J42 Unspecified chronic bronchitis: Secondary | ICD-10-CM | POA: Insufficient documentation

## 2012-06-09 DIAGNOSIS — Z8719 Personal history of other diseases of the digestive system: Secondary | ICD-10-CM | POA: Insufficient documentation

## 2012-09-10 ENCOUNTER — Other Ambulatory Visit: Payer: Self-pay | Admitting: Gastroenterology

## 2012-10-09 ENCOUNTER — Other Ambulatory Visit: Payer: Self-pay

## 2012-10-09 DIAGNOSIS — K7460 Unspecified cirrhosis of liver: Secondary | ICD-10-CM

## 2012-10-22 LAB — AFP TUMOR MARKER: AFP-Tumor Marker: 4.6 ng/mL (ref 0.0–8.0)

## 2012-10-26 ENCOUNTER — Other Ambulatory Visit: Payer: Self-pay | Admitting: Internal Medicine

## 2012-10-26 DIAGNOSIS — C220 Liver cell carcinoma: Secondary | ICD-10-CM

## 2012-10-26 NOTE — Progress Notes (Signed)
Patient is scheduled for Abd U/S on Tues March 11th at 8:00 am and she is aware

## 2012-10-31 ENCOUNTER — Ambulatory Visit (HOSPITAL_COMMUNITY)
Admission: RE | Admit: 2012-10-31 | Discharge: 2012-10-31 | Disposition: A | Discharge status: Home / Self Care | Payer: PRIVATE HEALTH INSURANCE | Source: Ambulatory Visit | Attending: Internal Medicine | Admitting: Internal Medicine

## 2012-10-31 DIAGNOSIS — R935 Abnormal findings on diagnostic imaging of other abdominal regions, including retroperitoneum: Secondary | ICD-10-CM | POA: Insufficient documentation

## 2012-10-31 DIAGNOSIS — B1920 Unspecified viral hepatitis C without hepatic coma: Secondary | ICD-10-CM | POA: Insufficient documentation

## 2012-10-31 DIAGNOSIS — C220 Liver cell carcinoma: Secondary | ICD-10-CM

## 2012-12-27 ENCOUNTER — Ambulatory Visit: Payer: Self-pay | Admitting: Obstetrics and Gynecology

## 2012-12-28 ENCOUNTER — Ambulatory Visit: Payer: PRIVATE HEALTH INSURANCE | Admitting: Gastroenterology

## 2012-12-29 ENCOUNTER — Ambulatory Visit: Payer: Self-pay | Admitting: Obstetrics and Gynecology

## 2013-01-02 ENCOUNTER — Encounter: Payer: Self-pay | Admitting: *Deleted

## 2013-01-03 ENCOUNTER — Ambulatory Visit: Payer: Self-pay | Admitting: Obstetrics and Gynecology

## 2013-01-08 ENCOUNTER — Encounter: Payer: Self-pay | Admitting: Internal Medicine

## 2013-01-09 ENCOUNTER — Ambulatory Visit: Payer: PRIVATE HEALTH INSURANCE | Admitting: Gastroenterology

## 2013-01-10 ENCOUNTER — Ambulatory Visit: Payer: Self-pay | Admitting: Obstetrics and Gynecology

## 2013-01-16 ENCOUNTER — Telehealth: Payer: Self-pay | Admitting: General Practice

## 2013-01-16 NOTE — Telephone Encounter (Signed)
Patient needs a abd ultrasound from the May recall list

## 2013-01-16 NOTE — Telephone Encounter (Signed)
Patient had an Abd U/S on March 18th  2014 and does not need another one for 6 months which is Sept 2014

## 2013-01-24 ENCOUNTER — Encounter: Payer: Self-pay | Admitting: Gastroenterology

## 2013-01-24 ENCOUNTER — Ambulatory Visit (INDEPENDENT_AMBULATORY_CARE_PROVIDER_SITE_OTHER): Payer: PRIVATE HEALTH INSURANCE | Admitting: Gastroenterology

## 2013-01-24 VITALS — BP 152/94 | HR 72 | Temp 97.9°F | Ht 65.0 in | Wt 151.6 lb

## 2013-01-24 DIAGNOSIS — R109 Unspecified abdominal pain: Secondary | ICD-10-CM

## 2013-01-24 DIAGNOSIS — R141 Gas pain: Secondary | ICD-10-CM

## 2013-01-24 DIAGNOSIS — B182 Chronic viral hepatitis C: Secondary | ICD-10-CM

## 2013-01-24 DIAGNOSIS — K7460 Unspecified cirrhosis of liver: Secondary | ICD-10-CM

## 2013-01-24 DIAGNOSIS — R143 Flatulence: Secondary | ICD-10-CM

## 2013-01-24 DIAGNOSIS — R142 Eructation: Secondary | ICD-10-CM

## 2013-01-24 MED ORDER — ABDOMINAL BINDER/ELASTIC LARGE MISC: 1.0000 | Status: DC | PRN

## 2013-01-24 NOTE — Patient Instructions (Addendum)
1. Our review all your records from last year's surgery and provide you with further recommendations. 2. Office visit in six months with Dr. Jena Gauss.

## 2013-01-24 NOTE — Progress Notes (Signed)
Primary Care Physician: Avon Gully, MD  Primary Gastroenterologist:  Roetta Sessions, MD   Chief Complaint  Patient presents with  . Abdominal Pain    occasionally a grabbing sensation in abdomen    HPI: Sabrina Wyatt is a 62 y.o. female here for follow-up. She was last seen in September 2013 for followup of cirrhosis secondary to hepatitis C status post eradication. She has chronic abdominal pain and sees Dr. Norville Haggard with [facility] in [place]. After last visit she had a CT of the abdomen and pelvis for new abdominal mass appreciated on exam but Dr. Jena Gauss. She had an entero-enteric intussusception at the level of the Roux limb distal anastomosis. She had surgery by Dr. Sherlynn Stalls at [facility] in October 2013. She had an exploratory laparotomy with resection of jejunojejunostomy, and in 2 layer handsewn jejunojejunostomy, and decide 2 layer handsewn jejunojejunostomy, lysis of adhesions. No evidence of small bowel mass. She was hospitalized for seven days. Had to pack incision. Two f/u visits but subsequently released.   She complains of swelling and bloating since her surgery. Her weight is up about 6 pounds since September 2013. Stomach lot flatter in the morning when wakes up but with one small meal then bloating/swelling/early satiety. Wears abd/back brace due to worsening of back pain. Feels like pulling in lower abd and then stinging pain like when a rubber band is released. Feels more fatigues/foggy headed. No change in meds. Feels it more in the morning but better in the afternoons. Denies pruritus or edema. Denies heartburn, vomiting, constipation, diarrhea, melena, rectal bleeding.  Current Outpatient Prescriptions  Medication Sig Dispense Refill  . Ascorbic Acid (VITAMIN C) 100 MG tablet Take 100 mg by mouth daily.        Marland Kitchen atenolol (TENORMIN) 25 MG tablet Take 25 mg by mouth daily.        . Calcium Carbonate-Vitamin D (CALTRATE 600+D) 600-400 MG-UNIT per  tablet Take 1 tablet by mouth daily.        . Cholecalciferol (VITAMIN D-3) 1000 UNITS CAPS Take 1,000 Units by mouth daily.      . DULoxetine (CYMBALTA) 60 MG capsule Take 60 mg by mouth daily.        Marland Kitchen etodolac (LODINE) 400 MG tablet Take 400 mg by mouth daily.       . fish oil-omega-3 fatty acids 1000 MG capsule Take 2 g by mouth daily.        . fluticasone (FLONASE) 50 MCG/ACT nasal spray Place 2 sprays into the nose daily.        Marland Kitchen lidocaine (LIDODERM) 5 % Place 1 patch onto the skin daily. Remove & Discard patch within 12 hours or as directed by MD      . loratadine (CLARITIN) 10 MG tablet Take 10 mg by mouth daily.      Marland Kitchen omeprazole (PRILOSEC) 20 MG capsule TAKE ONE CAPSULE BY MOUTH EVERY DAY  30 capsule  11  . Oxycodone HCl 10 MG TABS Take 10 mg by mouth every 6 (six) hours.      Marland Kitchen PROAIR HFA 108 (90 BASE) MCG/ACT inhaler Inhale 1 puff into the lungs every 6 (six) hours as needed.        No current facility-administered medications for this visit.    Allergies as of 01/24/2013  . (No Known Allergies)    ROS:  General: Negative for anorexia, weight loss, fever, chills, fatigue, weakness. See history of present illness ENT: Negative for hoarseness, difficulty swallowing ,  nasal congestion. CV: Negative for chest pain, angina, palpitations, dyspnea on exertion, peripheral edema.  Respiratory: Negative for dyspnea at rest, dyspnea on exertion, cough, sputum, wheezing.  GI: See history of present illness. GU:  Negative for dysuria, hematuria, urinary incontinence, urinary frequency, nocturnal urination.  Endo: Negative for unusual weight change.    Physical Examination:   BP 152/94  Pulse 72  Temp(Src) 97.9 F (36.6 C) (Oral)  Ht 5\' 5"  (1.651 m)  Wt 151 lb 9.6 oz (68.765 kg)  BMI 25.23 kg/m2  General: Well-nourished, well-developed in no acute distress.  Eyes: No icterus. Mouth: Oropharyngeal mucosa moist and pink , no lesions erythema or exudate. Lungs: Clear to  auscultation bilaterally.  Heart: Regular rate and rhythm, no murmurs rubs or gallops.  Abdomen: Bowel sounds are normal, mild diffuse tenderness, nondistended, no hepatosplenomegaly or masses, no abdominal bruits or hernia , no rebound or guarding.   Extremities: No lower extremity edema. No clubbing or deformities. Neuro: Alert and oriented x 4   Skin: Warm and dry, no jaundice.   Psych: Alert and cooperative, normal mood and affect.

## 2013-01-25 ENCOUNTER — Ambulatory Visit: Payer: Self-pay | Admitting: Obstetrics and Gynecology

## 2013-01-26 NOTE — Progress Notes (Signed)
Cc PCP 

## 2013-01-26 NOTE — Assessment & Plan Note (Addendum)
Complains of abdominal bloating but no appreciated ascites on exam. No evidence of third spacing. History of HCV eradication. She is due for labs. Last imaging of her liver via ultrasound in March 2014. Next ultrasound planned for September 2014. According to Dr. Luvenia Starch last note she can have her EGD (2 check for varices) at time of colonoscopy next year. OV six months with Dr. Jena Gauss.

## 2013-01-26 NOTE — Assessment & Plan Note (Signed)
Complains of postprandial abdominal bloating which she feels is worse since her last surgery. She feels like this is affecting her chronic back pain and request an abdominal/back binder. She feels better with applied pressure to her abdomen. Update lab work. Discuss further with Dr. Jena Gauss.

## 2013-01-31 ENCOUNTER — Ambulatory Visit: Payer: Self-pay | Admitting: Obstetrics and Gynecology

## 2013-01-31 ENCOUNTER — Encounter: Payer: Self-pay | Admitting: Obstetrics and Gynecology

## 2013-01-31 VITALS — BP 130/86 | Ht 65.0 in | Wt 151.0 lb

## 2013-01-31 NOTE — Progress Notes (Signed)
Patient ID: Sabrina Wyatt, female   DOB: [birth year], 62 y.o.   MRN: [id number] Pt wants pap and physical but is here too early. Needs it after 8/ 1

## 2013-02-07 ENCOUNTER — Telehealth: Payer: Self-pay | Admitting: Internal Medicine

## 2013-02-07 NOTE — Telephone Encounter (Signed)
I faxed over copies of the orders

## 2013-02-07 NOTE — Telephone Encounter (Signed)
Patient has lost her blood work orders and asked for [place] to fax orders to [facility] lab. I told her they should already have a copy, but will re send it for her.

## 2013-02-14 LAB — PROTIME-INR
INR: 1 (ref <1.50)
Prothrombin Time: 13.2 seconds (ref 11.6–15.2)

## 2013-02-15 LAB — COMPREHENSIVE METABOLIC PANEL
ALT: 38 U/L — ABNORMAL HIGH (ref 0–35)
AST: 39 U/L — ABNORMAL HIGH (ref 0–37)
Albumin: 4.4 g/dL (ref 3.5–5.2)
Alkaline Phosphatase: 133 U/L — ABNORMAL HIGH (ref 39–117)
BUN: 9 mg/dL (ref 6–23)
CO2: 17 mEq/L — ABNORMAL LOW (ref 19–32)
Calcium: 9.4 mg/dL (ref 8.4–10.5)
Chloride: 106 mEq/L (ref 96–112)
Creat: 0.84 mg/dL (ref 0.50–1.10)
Glucose, Bld: 163 mg/dL — ABNORMAL HIGH (ref 70–99)
Potassium: 4.5 mEq/L (ref 3.5–5.3)
Sodium: 139 mEq/L (ref 135–145)
Total Bilirubin: 0.6 mg/dL (ref 0.3–1.2)
Total Protein: 7.5 g/dL (ref 6.0–8.3)

## 2013-02-15 LAB — CBC WITH DIFFERENTIAL/PLATELET
Basophils Absolute: 0 10*3/uL (ref 0.0–0.1)
Basophils Relative: 1 % (ref 0–1)
Eosinophils Absolute: 0.3 10*3/uL (ref 0.0–0.7)
Eosinophils Relative: 4 % (ref 0–5)
HCT: 45.2 % (ref 36.0–46.0)
Hemoglobin: 15 g/dL (ref 12.0–15.0)
Lymphocytes Relative: 31 % (ref 12–46)
Lymphs Abs: 2.4 10*3/uL (ref 0.7–4.0)
MCH: 31.3 pg (ref 26.0–34.0)
MCHC: 33.2 g/dL (ref 30.0–36.0)
MCV: 94.4 fL (ref 78.0–100.0)
Monocytes Absolute: 0.5 10*3/uL (ref 0.1–1.0)
Monocytes Relative: 6 % (ref 3–12)
Neutro Abs: 4.7 10*3/uL (ref 1.7–7.7)
Neutrophils Relative %: 58 % (ref 43–77)
Platelets: 178 10*3/uL (ref 150–400)
RBC: 4.79 MIL/uL (ref 3.87–5.11)
RDW: 13.8 % (ref 11.5–15.5)
WBC: 7.9 10*3/uL (ref 4.0–10.5)

## 2013-02-19 NOTE — Progress Notes (Signed)
Quick Note:  Please let patient know her glucose is too high even if she had eaten before the test. SHE NEEDS TO SEE HER PCP. CC COPY OF LABS TO PCP. Her Alkphos, AST/ALT are minimally elevated in setting of cirrhosis.  Please let her know, Dr. Jena Gauss has been away on vacation and I will discuss her abdominal swelling/discomfort with his as soon as he returns. How is she doing? ______

## 2013-02-19 NOTE — Progress Notes (Signed)
Quick Note:  Pt is aware and will go see her pcp. She said that her abd still hurts a lot and she has a lot of pressure still. She said it feels tight and she is getting concerned. Feels like a rubber band that gets pulled tight and then snapped per pt.She wants to know if she needs to do anymore xrays, U/S, CT etc? ______

## 2013-03-09 NOTE — Progress Notes (Signed)
Quick Note:  Please let patient know. Dr. Jena Gauss wants another CT A/P with contrast due to abdominal pain, post-surgery. Please arrange. ______

## 2013-03-12 ENCOUNTER — Other Ambulatory Visit: Payer: Self-pay | Admitting: Internal Medicine

## 2013-03-12 DIAGNOSIS — R109 Unspecified abdominal pain: Secondary | ICD-10-CM

## 2013-03-12 NOTE — Progress Notes (Signed)
Patient called and wanted to have appt rescheduled due to transporation conflict.  Pts new appt. Is 03/16/13@10 :15am

## 2013-03-12 NOTE — Progress Notes (Signed)
I called the patient and lmom about her new appt and to arrive at 9:45 am

## 2013-03-12 NOTE — Progress Notes (Signed)
Patient is scheduled for a CT abd/pel on Wednesday July 23rd and I have [facility] for her to go by an pick up oral contrast as well as to call me back to confirm she received my message

## 2013-03-14 ENCOUNTER — Ambulatory Visit (HOSPITAL_COMMUNITY): Payer: PRIVATE HEALTH INSURANCE

## 2013-03-14 NOTE — Progress Notes (Signed)
Quick Note:  Tried to call pt- LMOM ______ 

## 2013-03-14 NOTE — Progress Notes (Signed)
Quick Note:  Mailed letter to pt ______ 

## 2013-03-16 ENCOUNTER — Ambulatory Visit (HOSPITAL_COMMUNITY)
Admission: RE | Admit: 2013-03-16 | Discharge: 2013-03-16 | Disposition: A | Discharge status: Home / Self Care | Payer: PRIVATE HEALTH INSURANCE | Source: Ambulatory Visit | Attending: Internal Medicine | Admitting: Internal Medicine

## 2013-03-16 DIAGNOSIS — R911 Solitary pulmonary nodule: Secondary | ICD-10-CM | POA: Insufficient documentation

## 2013-03-16 DIAGNOSIS — Z9889 Other specified postprocedural states: Secondary | ICD-10-CM | POA: Insufficient documentation

## 2013-03-16 DIAGNOSIS — R109 Unspecified abdominal pain: Secondary | ICD-10-CM | POA: Insufficient documentation

## 2013-03-16 MED ORDER — IOHEXOL 300 MG/ML  SOLN
100.0000 mL | Freq: Once | INTRAMUSCULAR | Status: AC | PRN
  Administered 2013-03-16: 100 mL via INTRAVENOUS

## 2013-03-26 ENCOUNTER — Encounter: Payer: Self-pay | Admitting: Internal Medicine

## 2013-03-26 NOTE — Progress Notes (Signed)
Quick Note:  Agree with plan as per RMR. Appears patient not aware of results yet. Would advise for f/u OV with RMR as planned. ______

## 2013-04-04 ENCOUNTER — Ambulatory Visit (INDEPENDENT_AMBULATORY_CARE_PROVIDER_SITE_OTHER): Payer: PRIVATE HEALTH INSURANCE | Admitting: Obstetrics and Gynecology

## 2013-04-04 ENCOUNTER — Other Ambulatory Visit (HOSPITAL_COMMUNITY)
Admission: RE | Admit: 2013-04-04 | Discharge: 2013-04-04 | Disposition: A | Discharge status: Home / Self Care | Payer: PRIVATE HEALTH INSURANCE | Source: Ambulatory Visit | Attending: Obstetrics and Gynecology | Admitting: Obstetrics and Gynecology

## 2013-04-04 ENCOUNTER — Encounter: Payer: Self-pay | Admitting: Obstetrics and Gynecology

## 2013-04-04 VITALS — BP 120/80 | Ht 65.0 in | Wt 154.0 lb

## 2013-04-04 DIAGNOSIS — Z124 Encounter for screening for malignant neoplasm of cervix: Secondary | ICD-10-CM | POA: Insufficient documentation

## 2013-04-04 DIAGNOSIS — R87810 Cervical high risk human papillomavirus (HPV) DNA test positive: Secondary | ICD-10-CM | POA: Insufficient documentation

## 2013-04-04 DIAGNOSIS — R87619 Unspecified abnormal cytological findings in specimens from cervix uteri: Secondary | ICD-10-CM | POA: Insufficient documentation

## 2013-04-04 DIAGNOSIS — Z01419 Encounter for gynecological examination (general) (routine) without abnormal findings: Secondary | ICD-10-CM

## 2013-04-04 NOTE — Progress Notes (Signed)
Patient ID: Sabrina Wyatt, female   DOB: 05-May-1951, 62 y.o.   MRN: [id number] Pt here today for Pap and physical..  Assessment:  Annual Gyn Exam   Plan:  1. pap smear done, next pap due 13m 2. return annually or prn 3    Annual mammogram advised Subjective:  Sabrina Wyatt is a 62 y.o. female No obstetric history on file. who presents for annual exam. No LMP recorded. Patient is postmenopausal. The patient has complaints today of skin "liver spots" also pt concerned as [place]. And she wonders if the marks are infectious  The following portions of the patient's history were reviewed and updated as appropriate: allergies, current medications, past family history, past medical history, past social history, past surgical history and problem list.  Review of Systems Constitutional: no fever chills sees[facility]: positive for recent surgery for adhesions s/p surgeries, adhesiolysis Genitourinary: negati ve  Objective:  BP 120/80  Ht 5\' 5"  (1.651 m)  Wt 154 lb (69.854 kg)  BMI 25.63 kg/m2   BMI: Body mass index is 25.63 kg/(m^2).  General Appearance: Alert, appropriate appearance for age. No acute distress HEENT: Grossly normal Neck / Thyroid:  Cardiovascular: RRR; normal S1, S2, no murmur Lungs: CTA bilaterally Back: No CVAT Breast Exam: No masses or nodes.No dimpling, nipple retraction or discharge. Gastrointestinal: Soft, non-tender, no masses or organomegaly Pelvic Exam: Vulva and vagina appear normal. Bimanual exam reveals normal uterus and adnexa. Rectovaginal: not indicated and guaiac negative stool obtained Lymphatic Exam: Non-palpable nodes in neck, clavicular, axillary, or inguinal regions Skin: no rash or abnormalities Neurologic: Normal gait and speech, no tremor  Psychiatric: Alert and oriented, appropriate affect.  Urinalysis:normal and Not done  Christin Bach. MD Pgr [phone] 12:38 PM

## 2013-04-04 NOTE — Patient Instructions (Signed)
Need one more normal pap, then routine followup

## 2013-04-06 ENCOUNTER — Other Ambulatory Visit: Payer: Self-pay | Admitting: Obstetrics and Gynecology

## 2013-04-06 DIAGNOSIS — Z139 Encounter for screening, unspecified: Secondary | ICD-10-CM

## 2013-04-09 ENCOUNTER — Telehealth: Payer: Self-pay | Admitting: *Deleted

## 2013-04-09 NOTE — Telephone Encounter (Signed)
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 Call transferred to front staff for an appt to be scheduled.

## 2013-04-09 NOTE — Telephone Encounter (Signed)
Message copied by Criss Alvine on Mon Apr 09, 2013 11:06 AM ------      Message from: Tilda Burrow      Created: Sun Apr 08, 2013  6:58 AM       Will schedule colposcopy ------

## 2013-04-11 ENCOUNTER — Other Ambulatory Visit (HOSPITAL_COMMUNITY): Payer: Self-pay | Admitting: Neurosurgery

## 2013-04-11 DIAGNOSIS — M549 Dorsalgia, unspecified: Secondary | ICD-10-CM

## 2013-04-11 DIAGNOSIS — M542 Cervicalgia: Secondary | ICD-10-CM

## 2013-04-13 ENCOUNTER — Ambulatory Visit (HOSPITAL_COMMUNITY): Payer: PRIVATE HEALTH INSURANCE

## 2013-04-13 ENCOUNTER — Ambulatory Visit (HOSPITAL_COMMUNITY): Admission: RE | Admit: 2013-04-13 | Payer: PRIVATE HEALTH INSURANCE | Source: Ambulatory Visit

## 2013-04-17 ENCOUNTER — Ambulatory Visit (HOSPITAL_COMMUNITY): Payer: PRIVATE HEALTH INSURANCE

## 2013-04-24 ENCOUNTER — Ambulatory Visit (HOSPITAL_COMMUNITY): Payer: PRIVATE HEALTH INSURANCE

## 2013-04-26 ENCOUNTER — Ambulatory Visit (HOSPITAL_COMMUNITY): Payer: PRIVATE HEALTH INSURANCE

## 2013-05-02 ENCOUNTER — Ambulatory Visit (HOSPITAL_COMMUNITY)
Admission: RE | Admit: 2013-05-02 | Discharge: 2013-05-02 | Disposition: A | Discharge status: Home / Self Care | Payer: PRIVATE HEALTH INSURANCE | Source: Ambulatory Visit | Attending: Neurosurgery | Admitting: Neurosurgery

## 2013-05-02 DIAGNOSIS — M47812 Spondylosis without myelopathy or radiculopathy, cervical region: Secondary | ICD-10-CM | POA: Diagnosis not present

## 2013-05-02 DIAGNOSIS — M542 Cervicalgia: Secondary | ICD-10-CM | POA: Insufficient documentation

## 2013-05-02 DIAGNOSIS — M545 Low back pain, unspecified: Secondary | ICD-10-CM | POA: Insufficient documentation

## 2013-05-02 DIAGNOSIS — M549 Dorsalgia, unspecified: Secondary | ICD-10-CM

## 2013-05-02 DIAGNOSIS — M5126 Other intervertebral disc displacement, lumbar region: Secondary | ICD-10-CM | POA: Diagnosis not present

## 2013-05-02 MED ORDER — GADOBENATE DIMEGLUMINE 529 MG/ML IV SOLN
13.0000 mL | Freq: Once | INTRAVENOUS | Status: AC | PRN
  Administered 2013-05-02: 13 mL via INTRAVENOUS

## 2013-05-03 LAB — POCT I-STAT, CHEM 8
BUN: 8 mg/dL (ref 6–23)
Calcium, Ion: 1.13 mmol/L (ref 1.13–1.30)
Chloride: 103 mEq/L (ref 96–112)
Creatinine, Ser: 0.9 mg/dL (ref 0.50–1.10)
Glucose, Bld: 99 mg/dL (ref 70–99)
HCT: 46 % (ref 36.0–46.0)
Hemoglobin: 15.6 g/dL — ABNORMAL HIGH (ref 12.0–15.0)
Potassium: 3.8 mEq/L (ref 3.5–5.1)
Sodium: 142 mEq/L (ref 135–145)
TCO2: 29 mmol/L (ref 0–100)

## 2013-05-07 ENCOUNTER — Encounter: Payer: PRIVATE HEALTH INSURANCE | Admitting: Obstetrics & Gynecology

## 2013-05-10 ENCOUNTER — Ambulatory Visit (HOSPITAL_COMMUNITY): Payer: PRIVATE HEALTH INSURANCE

## 2013-05-11 ENCOUNTER — Encounter: Payer: Self-pay | Admitting: Internal Medicine

## 2013-05-11 ENCOUNTER — Ambulatory Visit (INDEPENDENT_AMBULATORY_CARE_PROVIDER_SITE_OTHER): Payer: PRIVATE HEALTH INSURANCE | Admitting: Internal Medicine

## 2013-05-11 VITALS — BP 144/89 | HR 99 | Temp 97.6°F | Ht 65.0 in | Wt 153.6 lb

## 2013-05-11 DIAGNOSIS — K7460 Unspecified cirrhosis of liver: Secondary | ICD-10-CM

## 2013-05-11 DIAGNOSIS — K219 Gastro-esophageal reflux disease without esophagitis: Secondary | ICD-10-CM

## 2013-05-11 DIAGNOSIS — R1900 Intra-abdominal and pelvic swelling, mass and lump, unspecified site: Secondary | ICD-10-CM

## 2013-05-11 DIAGNOSIS — R109 Unspecified abdominal pain: Secondary | ICD-10-CM

## 2013-05-11 NOTE — Progress Notes (Signed)
Primary Care Physician:  Avon Gully, MD Primary Gastroenterologist:  Dr. Jena Gauss  Pre-Procedure History & Physical: HPI:  Sabrina Wyatt is a 62 y.o. female here for followup of complicated gallbladder surgery with recent entero-entero intussusception requiring surgery at [facility]. Patient has had significant problems with bloating, pain pressure since surgery CT earlier in the summer demonstrated no acute findings. She states over the past one month she's had more severe upper abdominal pain. No change in bowel habits. GERD symptoms well controlled on Prilosec. He is going to Dr. Norville Haggard, pain management Dr. Deeann Dowse in  [place]. Right pulmonary nodule on  prior CT will be followed up with a dedicated chest CT next year (history of smoking). Mild elevation in LFTs previously - repeated with outside labs; I have copies for the record. They came back entirely normal.  Past Medical History  Diagnosis Date  . GERD (gastroesophageal reflux disease)   . Cirrhosis, hepatitis C     CT on 03/26/13= no HCC, pt has been vaccinated for Hep A and Hep B  . Depression   . Carpal tunnel syndrome   . S/P endoscopy 08/27/10    antral erosions, otherwise normal, due egd 08/2012 to screen for varices  . Abdominal wall pain     chronic  . Pulmonary nodules   . PTSD (post-traumatic stress disorder)   . Chronic low back pain   . 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 Polysubstance abuse     HX of  . S/P colonoscopy 2005    Dr Arlie Solomons polyp removed, otherwise normal  . Hypertension   . Tobacco abuse   . Intussusception 04/2012    Past Surgical History  Procedure Laterality Date  . Cholecystectomy  2002  . Percutaneous transhepatic cholangiograhpy and billiary drainage  2002  . Roux-en-y-hepatojejunostomy  2003  . Spinal fusion      C5-C7  . Carpal tunnel release  12/2010  . Esophagogastroduodenoscopy    09/10/2003    Small hiatal hernia; otherwise normal stomach, normal D1 and D2  . Colonoscopy     09/10/2003    diminutive polyp in the rectum cold biopsied/removed/ Normal colon  . Esophagogastroduodenoscopy  08/27/2010    NFA:OZHYQM esophagus.  No varices.  Couple of tiny antral erosions of doubtful clinical significance, otherwise normal stomach, D1 and D2.    Prior to Admission medications   Medication Sig Start Date End Date Taking? Authorizing Provider  Ascorbic Acid (VITAMIN C) 100 MG tablet Take 100 mg by mouth daily.     Yes Historical Provider, MD  atenolol (TENORMIN) 25 MG tablet Take 25 mg by mouth daily.     Yes Historical Provider, MD  Calcium Carbonate-Vitamin D (CALTRATE 600+D) 600-400 MG-UNIT per tablet Take 1 tablet by mouth daily.     Yes Historical Provider, MD  Cholecalciferol (VITAMIN D-3) 1000 UNITS CAPS Take 1,000 Units by mouth daily.   Yes Historical Provider, MD  DULoxetine (CYMBALTA) 60 MG capsule Take 60 mg by mouth 2 (two) times daily.    Yes Historical Provider, MD  Elastic Bandages & Supports (ABDOMINAL BINDER/ELASTIC LARGE) MISC 1 Device by Does not apply route as needed. 01/24/13  Yes Tiffany Kocher, PA-C  EPIPEN 2-PAK 0.3 MG/0.3ML SOAJ injection  03/04/13  Yes Historical Provider, MD  etodolac (LODINE) 400 MG tablet Take 400 mg by mouth daily.  04/13/12  Yes Historical Provider, MD  fish oil-omega-3 fatty acids 1000 MG capsule Take 2 g by mouth daily.  Yes Historical Provider, MD  fluticasone (FLONASE) 50 MCG/ACT nasal spray Place 2 sprays into the nose daily.     Yes Historical Provider, MD  lidocaine (LIDODERM) 5 % Place 1 patch onto the skin daily. Remove & Discard patch within 12 hours or as directed by MD   Yes Historical Provider, MD  loratadine (CLARITIN) 10 MG tablet Take 10 mg by mouth daily.   Yes Historical Provider, MD  naproxen (NAPROSYN) 500 MG tablet Take 500 mg by mouth 2 (two) times daily with a meal.  03/08/13  Yes Historical Provider, MD  omeprazole (PRILOSEC) 20 MG capsule TAKE ONE CAPSULE BY MOUTH EVERY DAY 09/10/12  Yes Joselyn Arrow,  NP  Oxycodone HCl 10 MG TABS Take 10 mg by mouth every 6 (six) hours.   Yes Historical Provider, MD  PROAIR HFA 108 (90 BASE) MCG/ACT inhaler Inhale 1 puff into the lungs every 6 (six) hours as needed.  02/21/12  Yes Historical Provider, MD    Allergies as of 05/11/2013  . (No Known Allergies)    Family History  Problem Relation Age of Onset  . Coronary artery disease Brother   . Breast cancer Sister     History   Social History  . Marital Status: Single    Spouse Name: N/A    Number of Children: 1  . Years of Education: N/A   Occupational History  . disabled    Social History Main Topics  . Smoking status: Current Some Day Smoker -- 0.25 packs/day for 30 years    Types: Cigarettes  . Smokeless tobacco: Never Used     Comment: 3 cigs per day now, trying to quit  . Alcohol Use: No     Comment: social   . Drug Use: No     Comment: remote in teens-marijuana, heroin, cocaine  . Sexual Activity: No   Other Topics Concern  . Not on file   Social History Narrative  . No narrative on file    Review of Systems: See HPI, otherwise negative ROS  Physical Exam: BP 144/89  Pulse 99  Temp(Src) 97.6 F (36.4 C) (Oral)  Ht 5\' 5"  (1.651 m)  Wt 153 lb 9.6 oz (69.673 kg)  BMI 25.56 kg/m2 General:   Alert,  Well-developed, well-nourished, pleasant and cooperative in NAD Skin:  Intact without significant lesions or rashes. Eyes:  Sclera clear, no icterus.   Conjunctiva pink. Ears:  Normal auditory acuity. Nose:  No deformity, discharge,  or lesions. Mouth:  No deformity or lesions. Neck:  Supple; no masses or thyromegaly. No significant cervical adenopathy. Lungs:  Clear throughout to auscultation.   No wheezes, crackles, or rhonchi. No acute distress. Heart:  Regular rate and rhythm; no murmurs, clicks, rubs,  or gallops. Abdomen: Somewhat obese. Multiple surgical scars. Positive bowel sounds. She does have a golf ball sized firmness just below the xiphoid process at the  superior aspect of one of her laparotomy scars. It is mildly tender to  palpation.  Pulses:  Normal pulses noted. Extremities:  Without clubbing or edema.   Impression:  Complicated  GI surgical history. This lady has some recurrent upper abdominal  discomfort since her intussusception was repaired. She does have what appears to be a new mass as described above. I did not appreciate such a mass when I saw her with her intussusception last year. She said symptoms have worsened since her most recent CT. I believe we need to go ahead and get another cross-sectional imaging  study to sort this out further, I told this nice lady's either way, she may need to visit Dr. Chestine Spore again in the near future.  GERD symptoms well controlled on omeprazole. She is immune to hepatitis A and B Twin Rx 2009). She will need a screening EGD and colonoscopy in 2015.  Schedule office visit here in 6 months.  Interim telephone followup  We will also schedule a chest CT to followup on pulmonary nodule next year.

## 2013-05-11 NOTE — Patient Instructions (Addendum)
Proceed with abdominal and pelvic CT with contrast now to evaluate  subxiphoid mass  Will need a chest CT next year to evaluate pulmonary nodule seen on prior CT  Office visit with [place] in 6 months  to set up a screening EGD and colonoscopy   As discussed, depending upcoming CT findings, you may need to go back and visit Dr. Chestine Spore  Review of records revealed you were vaccinated against hepatitis A and B back in 2009 and, therefore, no further action needed

## 2013-05-14 ENCOUNTER — Ambulatory Visit (INDEPENDENT_AMBULATORY_CARE_PROVIDER_SITE_OTHER): Payer: PRIVATE HEALTH INSURANCE | Admitting: Obstetrics and Gynecology

## 2013-05-14 ENCOUNTER — Other Ambulatory Visit: Payer: Self-pay | Admitting: Obstetrics and Gynecology

## 2013-05-14 ENCOUNTER — Encounter: Payer: PRIVATE HEALTH INSURANCE | Admitting: Obstetrics & Gynecology

## 2013-05-14 ENCOUNTER — Encounter: Payer: Self-pay | Admitting: Obstetrics and Gynecology

## 2013-05-14 VITALS — BP 138/78 | Ht 65.0 in | Wt 152.8 lb

## 2013-05-14 DIAGNOSIS — R87810 Cervical high risk human papillomavirus (HPV) DNA test positive: Secondary | ICD-10-CM

## 2013-05-14 DIAGNOSIS — R87610 Atypical squamous cells of undetermined significance on cytologic smear of cervix (ASC-US): Secondary | ICD-10-CM

## 2013-05-14 DIAGNOSIS — R87620 Atypical squamous cells of undetermined significance on cytologic smear of vagina (ASC-US): Secondary | ICD-10-CM | POA: Insufficient documentation

## 2013-05-14 NOTE — Progress Notes (Signed)
  62 y.o. No obstetric history on file. here for colposcopy for ASCUS with POSITIVE high risk HPV pap smear on 8/14. Discussed role for HPV in cervical dysplasia, need for surveillance.  Patient given informed consent, signed copy in the chart, time out was performed.  Placed in lithotomy position. Cervix viewed with speculum and colposcope after application of acetic acid.   Colposcopy adequate? Yes  no visible lesions; biopsies obtained at [facility] obtained.   ECC specimen obtained. All specimens were labelled and sent to pathology.  Patient was given post procedure instructions.  Will follow up pathology and manage accordingly.  Routine preventative health maintenance measures emphasized.

## 2013-05-14 NOTE — Patient Instructions (Signed)
Pap in one year I cannot refil pain meds, those are to be managed by current pain clinic providers.Sabrina Wyatt

## 2013-05-14 NOTE — Progress Notes (Signed)
REMINDER APPT MADE FOR CHEST CT SCAN

## 2013-05-17 ENCOUNTER — Ambulatory Visit (HOSPITAL_COMMUNITY)
Admission: RE | Admit: 2013-05-17 | Discharge: 2013-05-17 | Disposition: A | Discharge status: Home / Self Care | Payer: PRIVATE HEALTH INSURANCE | Source: Ambulatory Visit | Attending: Obstetrics and Gynecology | Admitting: Obstetrics and Gynecology

## 2013-05-17 DIAGNOSIS — Z139 Encounter for screening, unspecified: Secondary | ICD-10-CM

## 2013-05-17 DIAGNOSIS — Z1231 Encounter for screening mammogram for malignant neoplasm of breast: Secondary | ICD-10-CM | POA: Insufficient documentation

## 2013-05-21 ENCOUNTER — Ambulatory Visit (HOSPITAL_COMMUNITY): Payer: PRIVATE HEALTH INSURANCE

## 2013-05-25 ENCOUNTER — Ambulatory Visit (INDEPENDENT_AMBULATORY_CARE_PROVIDER_SITE_OTHER): Payer: PRIVATE HEALTH INSURANCE | Admitting: Obstetrics and Gynecology

## 2013-05-25 ENCOUNTER — Encounter: Payer: Self-pay | Admitting: Obstetrics and Gynecology

## 2013-05-25 VITALS — BP 110/70 | Ht 65.0 in | Wt 150.0 lb

## 2013-05-25 DIAGNOSIS — R87810 Cervical high risk human papillomavirus (HPV) DNA test positive: Secondary | ICD-10-CM

## 2013-05-25 DIAGNOSIS — N870 Mild cervical dysplasia: Secondary | ICD-10-CM

## 2013-05-25 DIAGNOSIS — R87620 Atypical squamous cells of undetermined significance on cytologic smear of vagina (ASC-US): Secondary | ICD-10-CM

## 2013-05-25 NOTE — Progress Notes (Signed)
PATHOLOGY, CIN I WITH ASCCP GUIDELINES --> PAP AND COTEST 1 YR.

## 2013-05-25 NOTE — Patient Instructions (Addendum)
RETURN  FOR PAP AND RECHECK HPV TESTING IN 1 YR.

## 2013-05-28 ENCOUNTER — Encounter (HOSPITAL_COMMUNITY): Payer: Self-pay

## 2013-05-28 ENCOUNTER — Ambulatory Visit (HOSPITAL_COMMUNITY)
Admission: RE | Admit: 2013-05-28 | Discharge: 2013-05-28 | Disposition: A | Discharge status: Home / Self Care | Payer: PRIVATE HEALTH INSURANCE | Source: Ambulatory Visit | Attending: Internal Medicine | Admitting: Internal Medicine

## 2013-05-28 DIAGNOSIS — R109 Unspecified abdominal pain: Secondary | ICD-10-CM | POA: Insufficient documentation

## 2013-05-28 MED ORDER — IOHEXOL 300 MG/ML  SOLN
100.0000 mL | Freq: Once | INTRAMUSCULAR | Status: AC | PRN
  Administered 2013-05-28: 100 mL via INTRAVENOUS

## 2013-06-04 ENCOUNTER — Ambulatory Visit (HOSPITAL_COMMUNITY): Payer: 59 | Admitting: Physical Therapy

## 2013-06-12 ENCOUNTER — Telehealth: Payer: Self-pay | Admitting: Internal Medicine

## 2013-06-12 NOTE — Telephone Encounter (Signed)
Dr Chestine Spore from [place] called to speak with [facility] and I sent [facility] a staff message that Dr Chestine Spore needed for him to call [phone] regarding this patient

## 2013-06-26 ENCOUNTER — Other Ambulatory Visit: Payer: Self-pay | Admitting: Neurosurgery

## 2013-06-26 DIAGNOSIS — M47812 Spondylosis without myelopathy or radiculopathy, cervical region: Secondary | ICD-10-CM

## 2013-07-10 ENCOUNTER — Ambulatory Visit (INDEPENDENT_AMBULATORY_CARE_PROVIDER_SITE_OTHER): Payer: PRIVATE HEALTH INSURANCE | Admitting: Gastroenterology

## 2013-07-10 ENCOUNTER — Encounter: Payer: Self-pay | Admitting: Gastroenterology

## 2013-07-10 VITALS — BP 146/89 | HR 66 | Temp 97.6°F | Ht 65.0 in | Wt 154.8 lb

## 2013-07-10 DIAGNOSIS — G8929 Other chronic pain: Secondary | ICD-10-CM | POA: Insufficient documentation

## 2013-07-10 DIAGNOSIS — B182 Chronic viral hepatitis C: Secondary | ICD-10-CM

## 2013-07-10 DIAGNOSIS — R109 Unspecified abdominal pain: Secondary | ICD-10-CM

## 2013-07-10 DIAGNOSIS — K7460 Unspecified cirrhosis of liver: Secondary | ICD-10-CM

## 2013-07-10 DIAGNOSIS — R1013 Epigastric pain: Secondary | ICD-10-CM

## 2013-07-10 DIAGNOSIS — R141 Gas pain: Secondary | ICD-10-CM

## 2013-07-10 DIAGNOSIS — F5000 Anorexia nervosa, unspecified: Secondary | ICD-10-CM

## 2013-07-10 MED ORDER — PEG 3350-KCL-NA BICARB-NACL 420 G PO SOLR: 4000.0000 mL | ORAL | Status: DC

## 2013-07-10 NOTE — Progress Notes (Signed)
Primary Care Physician: Avon Gully, MD  Primary Gastroenterologist:  Roetta Sessions, MD   Chief Complaint  Patient presents with  . Follow-up    HPI: Sabrina Wyatt is a 62 y.o. female here for f/u. Last seen 05/11/13 by Dr. Jena Gauss. 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 History of chronic abdominal pain related to complicated gallbladder surgery remotely. Sees Dr. Norville Haggard in [place] for pain management. 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 She had surgery by Dr. Sherlynn Stalls at [facility] in October 2013. She had an exploratory laparotomy with resection of jejunojejunostomy, end-to-end 2 layer handsewn jejunojejunostomy, lysis of adhesions. No evidence of small bowel mass.   Saw Dr. Jena Gauss on 05/11/2013. 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 Repeat CT scan showed marked enlargement of the lateral segment of the left lobe, surface nodularity, small right lower lobe nodule, stable, followup CT scan in one year recommended, nothing to explain pain. Epigastric mass likely due to enlarged lateral segment of the left liver lobe.  She complains of swelling and bloating since her surgery. Her weight is up about 9 pounds since September 2013. Continues to have significant postprandial abdominal swelling, early satiety. Feels like pulling in lower abd and then stinging pain like when a rubber band is released. Denies pruritus or edema. Denies heartburn, vomiting, constipation, diarrhea, melena, rectal bleeding. Stretching/knotting type epigastric pain. No heartburn. Myelogram for back recommended, tightening of nerves in upper back. Dr. Ollen Bowl tomorrow, ?injections of abdominal wall pain? No postprandial pain. Appetite lessened.  Nothing she eats affects the pain.   Current Outpatient Prescriptions  Medication Sig Dispense Refill  . albuterol (PROVENTIL) (2.5 MG/3ML) 0.083% nebulizer solution Take 2.5 mg by  nebulization every 6 (six) hours as needed for wheezing or shortness of breath.      Marland Kitchen atenolol (TENORMIN) 25 MG tablet Take 25 mg by mouth daily.        . DULoxetine (CYMBALTA) 60 MG capsule Take 60 mg by mouth 2 (two) times daily.       Marland Kitchen EPIPEN 2-PAK 0.3 MG/0.3ML SOAJ injection       . etodolac (LODINE) 400 MG tablet Take 400 mg by mouth daily.       . fluticasone (FLONASE) 50 MCG/ACT nasal spray Place 2 sprays into the nose daily.        Marland Kitchen lidocaine (LIDODERM) 5 % Place 1 patch onto the skin daily. Remove & Discard patch within 12 hours or as directed by MD      . loratadine (CLARITIN) 10 MG tablet Take 10 mg by mouth daily.      Marland Kitchen omeprazole (PRILOSEC) 20 MG capsule TAKE ONE CAPSULE BY MOUTH EVERY DAY  30 capsule  11  . Oxycodone HCl 10 MG TABS Take 10 mg by mouth every 6 (six) hours.      Marland Kitchen PROAIR HFA 108 (90 BASE) MCG/ACT inhaler Inhale 1 puff into the lungs every 6 (six) hours as needed.        No current facility-administered medications for this visit.    Allergies as of 07/10/2013  . (No Known Allergies)   Past Medical History  Diagnosis Date  . GERD (gastroesophageal reflux disease)   . Cirrhosis, hepatitis C     CT on 03/26/13= no HCC, pt has been vaccinated for Hep A and Hep B. s/p treatment of HCV  . Depression   . Carpal tunnel syndrome   . S/P endoscopy  08/27/10    antral erosions, otherwise normal, due egd 08/2012 to screen for varices  . Abdominal wall pain     chronic  . Pulmonary nodules   . PTSD (post-traumatic stress disorder)   . Chronic low back pain   . 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 Polysubstance abuse     HX of  . S/P colonoscopy 2005    Dr Arlie Solomons polyp removed, otherwise normal  . Hypertension   . Tobacco abuse   . Intussusception 04/2012   Past Surgical History  Procedure Laterality Date  . Cholecystectomy  2002  . Percutaneous transhepatic cholangiograhpy and billiary drainage  2002  . Roux-en-y-hepatojejunostomy  2003  .  Spinal fusion      C5-C7  . Carpal tunnel release  12/2010  . Esophagogastroduodenoscopy    09/10/2003    Small hiatal hernia; otherwise normal stomach, normal D1 and D2  . Colonoscopy    09/10/2003    diminutive polyp in the rectum cold biopsied/removed/ Normal colon  . Esophagogastroduodenoscopy  08/27/2010    WJX:[id number] esophagus.  No varices.  Couple of tiny antral erosions of doubtful clinical significance, otherwise normal stomach, D1 and D2.   Family History  Problem Relation Age of Onset  . Coronary artery disease Brother   . Breast cancer Sister    History   Social History  . Marital Status: Single    Spouse Name: N/A    Number of Children: 1  . Years of Education: N/A   Occupational History  . disabled    Social History Main Topics  . Smoking status: Current Some Day Smoker -- 0.25 packs/day for 30 years    Types: Cigarettes  . Smokeless tobacco: Never Used     Comment: One pack every 3 days  . Alcohol Use: Yes     Comment: social   . Drug Use: No     Comment: remote in teens-marijuana, heroin, cocaine  . Sexual Activity: No   Other Topics Concern  . None   Social History Narrative  . None     ROS:  General: Negative for weight loss, fever, chills, fatigue, weakness. See history of present illness ENT: Negative for hoarseness, difficulty swallowing , nasal congestion. CV: Negative for chest pain, angina, palpitations, dyspnea on exertion, peripheral edema.  Respiratory: Negative for dyspnea at rest, dyspnea on exertion, cough, sputum, wheezing.  GI: See history of present illness. GU:  Negative for dysuria, hematuria, urinary incontinence, urinary frequency, nocturnal urination.  Endo: Negative for unusual weight change.    Physical Examination:   BP 146/89  Pulse 66  Temp(Src) 97.6 F (36.4 C) (Oral)  Ht 5\' 5"  (1.651 m)  Wt 154 lb 12.8 oz (70.217 kg)  BMI 25.76 kg/m2  General: Well-nourished, well-developed in no acute distress.  Eyes: No  icterus. Mouth: Oropharyngeal mucosa moist and pink , no lesions erythema or exudate. Lungs: Clear to auscultation bilaterally.  Heart: Regular rate and rhythm, no murmurs rubs or gallops.  Abdomen: Bowel sounds are normal, tenderness noted in the epigastrium. Well-healed abdominal incisions. nondistended, no hepatosplenomegaly or masses, no abdominal bruits or hernia , no rebound or guarding.   Extremities: No lower extremity edema. No clubbing or deformities. Neuro: Alert and oriented x 4   Skin: Warm and dry, no jaundice.   Psych: Alert and cooperative, normal mood and affect.

## 2013-07-10 NOTE — Patient Instructions (Signed)
1. We have scheduled you for an upper endoscopy and colonoscopy with Dr. Jena Gauss. Please see separate instructions. 2. Dermatologist: Dr. Terri Piedra in [place]. [phone].

## 2013-07-11 ENCOUNTER — Encounter: Payer: Self-pay | Admitting: Gastroenterology

## 2013-07-11 NOTE — Assessment & Plan Note (Addendum)
Continues to complain of postprandial abdominal swelling, anorexia, early satiety worse since surgery one year ago. No weight loss noted. Actually has had some weight gain. Increased abdominal girth but no evidence of ascites on CT. She is on chronic NSAIDs. Last EGD 3 years ago. Patient sent back to see Dr. Sherlynn Stalls for reevaluation on 06/11/13. He felt her focal are of pain likely secondary to scar tissue s/p surgery. He suggested this area of scar tissue to be injected for added pain control. For her ongoing early satiety, anorexia, epig pain, and for esophageal variceal screening, we have offered her upper endoscopy. She is also due for 10 year follow up colonoscopy in January so we will pursue TCS at this time as well. Given polypharmacy, chronic narcotic use, deep sedation in the OR will be arranged.  I have discussed the risks, alternatives, benefits with regards to but not limited to the risk of reaction to medication, bleeding, infection, perforation and the patient is agreeable to proceed. Written consent to be obtained.

## 2013-07-11 NOTE — Progress Notes (Signed)
Cc PCP 

## 2013-07-11 NOTE — Assessment & Plan Note (Signed)
Currently up to date on imaging and labs. OV in 3 months with Dr. Jena Gauss.

## 2013-07-25 NOTE — Patient Instructions (Addendum)
PHEOBE SANDIFORD  07/25/2013   Your procedure is scheduled on:  08/02/13  Report to [facility] at 08:45 AM.  Call this number if you have problems the morning of surgery: [phone]   Remember:   Do not eat food or drink liquids after midnight.   Take these medicines the morning of surgery with A SIP OF WATER: atenolol, cymbalta, lodine, claritin, prilosec, oxycodone. Take your nebulizer before you come and take your inhaler before you come and bring it with you.   Do not wear jewelry, make-up or nail polish.  Do not wear lotions, powders, or perfumes.   Do not shave 48 hours prior to surgery. Men may shave face and neck.  Do not bring valuables to the hospital.  [facility] is not responsible for any belongings or valuables.               Contacts, dentures or bridgework may not be worn into surgery.  Leave suitcase in the car. After surgery it may be brought to your room.  For patients admitted to the hospital, discharge time is determined by your treatment team.               Patients discharged the day of surgery will not be allowed to drive home.    Special Instructions: Start using your bowel prep as directed by your GI doctor.   Please read over the following fact sheets that you were given: Anesthesia Post-op Instructions and Care and Recovery After Surgery    Esophagogastroduodenoscopy Esophagogastroduodenoscopy (EGD) is a procedure to examine the lining of the esophagus, stomach, and first part of the small intestine (duodenum). A long, flexible, lighted tube with a camera attached (endoscope) is inserted down the throat to view these organs. This procedure is done to detect problems or abnormalities, such as inflammation, bleeding, ulcers, or growths, in order to treat them. The procedure lasts about 5 20 minutes. It is usually an outpatient procedure, but it may need to be performed in emergency cases in the hospital. LET YOUR CAREGIVER KNOW ABOUT:   Allergies to food or  medicine.  All medicines you are taking, including vitamins, herbs, eyedrops, and over-the-counter medicines and creams.  Use of steroids (by mouth or creams).  Previous problems you or members of your family have had with the use of anesthetics.  Any blood disorders you have.  Previous surgeries you have had.  Other health problems you have.  Possibility of pregnancy, if this applies. RISKS AND COMPLICATIONS  Generally, EGD is a safe procedure. However, as with any procedure, complications can occur. Possible complications include:  Infection.  Bleeding.  Tearing (perforation) of the esophagus, stomach, or duodenum.  Difficulty breathing or not being able to breath.  Excessive sweating.  Spasms of the larynx.  Slowed heartbeat.  Low blood pressure. BEFORE THE PROCEDURE  Do not eat or drink anything for 6 8 hours before the procedure or as directed by your caregiver.  Ask your caregiver about changing or stopping your regular medicines.  If you wear dentures, be prepared to remove them before the procedure.  Arrange for someone to drive you home after the procedure. PROCEDURE   A vein will be accessed to give medicines and fluids. A medicine to relax you (sedative) and a pain reliever will be given through that access into the vein.  A numbing medicine (local anesthetic) may be sprayed on your throat for comfort and to stop you from gagging or coughing.  A mouth  guard may be placed in your mouth to protect your teeth and to keep you from biting on the endoscope.  You will be asked to lie on your left side.  The endoscope is inserted down your throat and into the esophagus, stomach, and duodenum.  Air is put through the endoscope to allow your caregiver to view the lining of your esophagus clearly.  The esophagus, stomach, and duodenum is then examined. During the exam, your caregiver may:  Remove tissue to be examined under a microscope (biopsy) for  inflammation, infection, or other medical problems.  Remove growths.  Remove objects (foreign bodies) that are stuck.  Treat any bleeding with medicines or other devices that stop tissues from bleeding (hot cauters, clipping devices).  Widen (dilate) or stretch narrowed areas of the esophagus and stomach.  The endoscope will then be withdrawn. AFTER THE PROCEDURE  You will be taken to a recovery area to be monitored. You will be able to go home once you are stable and alert.  Do not eat or drink anything until the local anesthetic and numbing medicines have worn off. You may choke.  It is normal to feel bloated, have pain with swallowing, or have a sore throat for a short time. This will wear off.  Your caregiver should be able to discuss his or her findings with you. It will take longer to discuss the test results if any biopsies were taken. Document Released: 12/10/2004 Document Revised: 07/26/2012 Document Reviewed: 07/12/2012 [facility] Patient Information 2014 [place], [place].    Colonoscopy A colonoscopy is an exam to evaluate your entire colon. In this exam, your colon is cleansed. A long fiberoptic tube is inserted through your rectum and into your colon. The fiberoptic scope (endoscope) is a long bundle of enclosed and very flexible fibers. These fibers transmit light to the area examined and send images from that area to your caregiver. Discomfort is usually minimal. You may be given a drug to help you sleep (sedative) during or prior to the procedure. This exam helps to detect lumps (tumors), polyps, inflammation, and areas of bleeding. Your caregiver may also take a small piece of tissue (biopsy) that will be examined under a microscope. LET YOUR CAREGIVER KNOW ABOUT:   Allergies to food or medicine.  Medicines taken, including vitamins, herbs, eyedrops, over-the-counter medicines, and creams.  Use of steroids (by mouth or creams).  Previous problems with anesthetics or  numbing medicines.  History of bleeding problems or blood clots.  Previous surgery.  Other health problems, including diabetes and kidney problems.  Possibility of pregnancy, if this applies. BEFORE THE PROCEDURE   A clear liquid diet may be required for 2 days before the exam.  Ask your caregiver about changing or stopping your regular medications.  Liquid injections (enemas) or laxatives may be required.  A large amount of electrolyte solution may be given to you to drink over a short period of time. This solution is used to clean out your colon.  You should be present 60 minutes prior to your procedure or as directed by your caregiver. AFTER THE PROCEDURE   If you received a sedative or pain relieving medication, you will need to arrange for someone to drive you home.  Occasionally, there is a little blood passed with the first bowel movement. Do not be concerned. FINDING OUT THE RESULTS OF YOUR TEST Not all test results are available during your visit. If your test results are not back during the visit, make an appointment  with your caregiver to find out the results. Do not assume everything is normal if you have not heard from your caregiver or the medical facility. It is important for you to follow up on all of your test results. HOME CARE INSTRUCTIONS   It is not unusual to pass moderate amounts of gas and experience mild abdominal cramping following the procedure. This is due to air being used to inflate your colon during the exam. Walking or a warm pack on your belly (abdomen) may help.  You may resume all normal meals and activities after sedatives and medicines have worn off.  Only take over-the-counter or prescription medicines for pain, discomfort, or fever as directed by your caregiver. Do not use aspirin or blood thinners if a biopsy was taken. Consult your caregiver for medicine usage if biopsies were taken. SEEK IMMEDIATE MEDICAL CARE IF:   You have a  fever.  You pass large blood clots or fill a toilet with blood following the procedure. This may also occur 10 to 14 days following the procedure. This is more likely if a biopsy was taken.  You develop abdominal pain that keeps getting worse and cannot be relieved with medicine. Document Released: 08/06/2000 Document Revised: 11/01/2011 Document Reviewed: 03/12/2013 [facility] Patient Information 2014 [place], [place].    PATIENT INSTRUCTIONS POST-ANESTHESIA  IMMEDIATELY FOLLOWING SURGERY:  Do not drive or operate machinery for the first twenty four hours after surgery.  Do not make any important decisions for twenty four hours after surgery or while taking narcotic pain medications or sedatives.  If you develop intractable nausea and vomiting or a severe headache please notify your doctor immediately.  FOLLOW-UP:  Please make an appointment with your surgeon as instructed. You do not need to follow up with anesthesia unless specifically instructed to do so.  WOUND CARE INSTRUCTIONS (if applicable):  Keep a dry clean dressing on the anesthesia/puncture wound site if there is drainage.  Once the wound has quit draining you may leave it open to air.  Generally you should leave the bandage intact for twenty four hours unless there is drainage.  If the epidural site drains for more than 36-48 hours please call the anesthesia department.  QUESTIONS?:  Please feel free to call your physician or the hospital operator if you have any questions, and they will be happy to assist you.

## 2013-07-26 ENCOUNTER — Encounter (HOSPITAL_COMMUNITY): Payer: Self-pay

## 2013-07-26 ENCOUNTER — Encounter (HOSPITAL_COMMUNITY): Payer: Self-pay | Admitting: Pharmacy Technician

## 2013-07-26 ENCOUNTER — Encounter (HOSPITAL_COMMUNITY)
Admission: RE | Admit: 2013-07-26 | Discharge: 2013-07-26 | Disposition: A | Discharge status: Home / Self Care | Payer: PRIVATE HEALTH INSURANCE | Source: Ambulatory Visit | Attending: Internal Medicine | Admitting: Internal Medicine

## 2013-07-26 ENCOUNTER — Other Ambulatory Visit: Payer: Self-pay

## 2013-07-26 DIAGNOSIS — Z01818 Encounter for other preprocedural examination: Secondary | ICD-10-CM | POA: Insufficient documentation

## 2013-07-26 DIAGNOSIS — Z01810 Encounter for preprocedural cardiovascular examination: Secondary | ICD-10-CM | POA: Insufficient documentation

## 2013-07-26 DIAGNOSIS — Z01812 Encounter for preprocedural laboratory examination: Secondary | ICD-10-CM | POA: Insufficient documentation

## 2013-07-26 HISTORY — DX: Shortness of breath: R06.02

## 2013-07-26 HISTORY — DX: Unspecified convulsions: R56.9

## 2013-07-26 LAB — BASIC METABOLIC PANEL
BUN: 7 mg/dL (ref 6–23)
CO2: 23 mEq/L (ref 19–32)
Calcium: 8.7 mg/dL (ref 8.4–10.5)
Chloride: 103 mEq/L (ref 96–112)
Creatinine, Ser: 0.55 mg/dL (ref 0.50–1.10)
GFR calc Af Amer: >90 mL/min (ref >90)
GFR calc non Af Amer: >90 mL/min (ref >90)
Glucose, Bld: 96 mg/dL (ref 70–99)
Potassium: 4 mEq/L (ref 3.5–5.1)
Sodium: 139 mEq/L (ref 135–145)

## 2013-07-26 LAB — HEMOGLOBIN AND HEMATOCRIT, BLOOD
HCT: 42.1 % (ref 36.0–46.0)
Hemoglobin: 13.9 g/dL (ref 12.0–15.0)

## 2013-07-27 ENCOUNTER — Telehealth: Payer: Self-pay | Admitting: *Deleted

## 2013-07-27 MED ORDER — ACYCLOVIR 400 MG PO TABS: 400.0000 mg | ORAL_TABLET | Freq: Every day | ORAL | Status: DC

## 2013-07-27 NOTE — Telephone Encounter (Signed)
rx acyclovir x 5 d , e-sig completed

## 2013-07-27 NOTE — Telephone Encounter (Signed)
Spoke with pt. Has herpes and started with a breakout last pm. Can you order meds? Uses [facility] in [place]. Thanks!!!

## 2013-07-27 NOTE — Addendum Note (Signed)
Addended by: Tilda Burrow on: 07/27/2013 11:05 PM   Modules accepted: Orders

## 2013-08-02 ENCOUNTER — Encounter (HOSPITAL_COMMUNITY): Payer: Self-pay | Admitting: *Deleted

## 2013-08-02 ENCOUNTER — Ambulatory Visit (HOSPITAL_COMMUNITY): Payer: PRIVATE HEALTH INSURANCE | Admitting: Anesthesiology

## 2013-08-02 ENCOUNTER — Ambulatory Visit (HOSPITAL_COMMUNITY)
Admission: RE | Admit: 2013-08-02 | Discharge: 2013-08-02 | Disposition: A | Discharge status: Home / Self Care | Payer: PRIVATE HEALTH INSURANCE | Source: Ambulatory Visit | Attending: Internal Medicine | Admitting: Internal Medicine

## 2013-08-02 ENCOUNTER — Encounter (HOSPITAL_COMMUNITY)
Admission: RE | Disposition: A | Discharge status: Home / Self Care | Payer: Self-pay | Source: Ambulatory Visit | Attending: Internal Medicine

## 2013-08-02 ENCOUNTER — Encounter (HOSPITAL_COMMUNITY): Payer: PRIVATE HEALTH INSURANCE | Admitting: Anesthesiology

## 2013-08-02 DIAGNOSIS — K5730 Diverticulosis of large intestine without perforation or abscess without bleeding: Secondary | ICD-10-CM | POA: Insufficient documentation

## 2013-08-02 DIAGNOSIS — R109 Unspecified abdominal pain: Secondary | ICD-10-CM | POA: Insufficient documentation

## 2013-08-02 DIAGNOSIS — Z1211 Encounter for screening for malignant neoplasm of colon: Secondary | ICD-10-CM | POA: Insufficient documentation

## 2013-08-02 DIAGNOSIS — K319 Disease of stomach and duodenum, unspecified: Secondary | ICD-10-CM

## 2013-08-02 DIAGNOSIS — I10 Essential (primary) hypertension: Secondary | ICD-10-CM | POA: Insufficient documentation

## 2013-08-02 HISTORY — PX: COLONOSCOPY WITH PROPOFOL: SHX5780

## 2013-08-02 HISTORY — PX: ESOPHAGOGASTRODUODENOSCOPY (EGD) WITH PROPOFOL: SHX5813

## 2013-08-02 SURGERY — ESOPHAGOGASTRODUODENOSCOPY (EGD) WITH PROPOFOL
Anesthesia: Monitor Anesthesia Care

## 2013-08-02 MED ORDER — FENTANYL CITRATE 0.05 MG/ML IJ SOLN
INTRAMUSCULAR | Status: DC | PRN
  Administered 2013-08-02: 50 ug via INTRAVENOUS
  Administered 2013-08-02: 25 ug via INTRAVENOUS

## 2013-08-02 MED ORDER — LACTATED RINGERS IV SOLN
INTRAVENOUS | Status: DC
  Administered 2013-08-02: 1000 mL via INTRAVENOUS

## 2013-08-02 MED ORDER — FENTANYL CITRATE 0.05 MG/ML IJ SOLN
INTRAMUSCULAR | Status: AC
  Filled 2013-08-02: qty 2

## 2013-08-02 MED ORDER — LIDOCAINE HCL (PF) 1 % IJ SOLN
INTRAMUSCULAR | Status: AC
  Filled 2013-08-02: qty 5

## 2013-08-02 MED ORDER — MIDAZOLAM HCL 2 MG/2ML IJ SOLN
1.0000 mg | INTRAMUSCULAR | Status: DC | PRN
  Administered 2013-08-02: 2 mg via INTRAVENOUS

## 2013-08-02 MED ORDER — ONDANSETRON HCL 4 MG/2ML IJ SOLN
4.0000 mg | Freq: Once | INTRAMUSCULAR | Status: AC
  Administered 2013-08-02: 4 mg via INTRAVENOUS

## 2013-08-02 MED ORDER — WATER FOR IRRIGATION, STERILE IR SOLN
Status: DC | PRN
  Administered 2013-08-02: 1000 mL

## 2013-08-02 MED ORDER — GLYCOPYRROLATE 0.2 MG/ML IJ SOLN
0.2000 mg | Freq: Once | INTRAMUSCULAR | Status: AC
  Administered 2013-08-02: 0.2 mg via INTRAVENOUS

## 2013-08-02 MED ORDER — PROPOFOL 10 MG/ML IV BOLUS
INTRAVENOUS | Status: AC
  Filled 2013-08-02: qty 20

## 2013-08-02 MED ORDER — MIDAZOLAM HCL 5 MG/5ML IJ SOLN
INTRAMUSCULAR | Status: DC | PRN
  Administered 2013-08-02: 2 mg via INTRAVENOUS

## 2013-08-02 MED ORDER — GLYCOPYRROLATE 0.2 MG/ML IJ SOLN
INTRAMUSCULAR | Status: AC
  Filled 2013-08-02: qty 1

## 2013-08-02 MED ORDER — MIDAZOLAM HCL 2 MG/2ML IJ SOLN
INTRAMUSCULAR | Status: AC
  Filled 2013-08-02: qty 2

## 2013-08-02 MED ORDER — ONDANSETRON HCL 4 MG/2ML IJ SOLN
INTRAMUSCULAR | Status: AC
  Filled 2013-08-02: qty 2

## 2013-08-02 MED ORDER — LACTATED RINGERS IV SOLN
INTRAVENOUS | Status: DC | PRN
  Administered 2013-08-02: 09:00:00 via INTRAVENOUS

## 2013-08-02 MED ORDER — FENTANYL CITRATE 0.05 MG/ML IJ SOLN
25.0000 ug | INTRAMUSCULAR | Status: AC
  Administered 2013-08-02 (×2): 25 ug via INTRAVENOUS

## 2013-08-02 MED ORDER — STERILE WATER FOR IRRIGATION IR SOLN
Status: DC | PRN
  Administered 2013-08-02: 11:00:00

## 2013-08-02 MED ORDER — PROPOFOL INFUSION 10 MG/ML OPTIME
INTRAVENOUS | Status: DC | PRN
  Administered 2013-08-02: 75 ug/kg/min via INTRAVENOUS
  Administered 2013-08-02: 80 ug/kg/min via INTRAVENOUS

## 2013-08-02 MED ORDER — LIDOCAINE HCL (CARDIAC) 10 MG/ML IV SOLN
INTRAVENOUS | Status: DC | PRN
  Administered 2013-08-02: 50 mg via INTRAVENOUS

## 2013-08-02 MED ORDER — BUTAMBEN-TETRACAINE-BENZOCAINE 2-2-14 % EX AERO
1.0000 | INHALATION_SPRAY | Freq: Once | CUTANEOUS | Status: AC
  Administered 2013-08-02: 1 via TOPICAL
  Filled 2013-08-02: qty 56

## 2013-08-02 SURGICAL SUPPLY — 27 items
BLOCK BITE 60FR ADLT L/F BLUE (MISCELLANEOUS) ×1 IMPLANT
DEVICE CLIP HEMOSTAT 235CM (CLIP) IMPLANT
ELECT REM PT RETURN 9FT ADLT (ELECTROSURGICAL)
ELECTRODE REM PT RTRN 9FT ADLT (ELECTROSURGICAL) IMPLANT
FCP BXJMBJMB 240X2.8X (CUTTING FORCEPS)
FLOOR PAD 36X40 (MISCELLANEOUS) ×2
FORCEPS BIOP RAD 4 LRG CAP 4 (CUTTING FORCEPS) IMPLANT
FORCEPS BIOP RJ4 240 W/NDL (CUTTING FORCEPS)
FORCEPS BXJMBJMB 240X2.8X (CUTTING FORCEPS) IMPLANT
FORMALIN 10 PREFIL 20ML (MISCELLANEOUS) IMPLANT
INJECTOR/SNARE I SNARE (MISCELLANEOUS) IMPLANT
KIT CLEAN ENDO COMPLIANCE (KITS) ×2 IMPLANT
LUBRICANT JELLY 4.5OZ STERILE (MISCELLANEOUS) ×2 IMPLANT
MANIFOLD NEPTUNE II (INSTRUMENTS) ×1 IMPLANT
NDL SCLEROTHERAPY 25GX240 (NEEDLE) IMPLANT
NEEDLE SCLEROTHERAPY 25GX240 (NEEDLE) IMPLANT
PAD FLOOR 36X40 (MISCELLANEOUS) IMPLANT
PROBE APC STR FIRE (PROBE) IMPLANT
PROBE INJECTION GOLD (MISCELLANEOUS)
PROBE INJECTION GOLD 7FR (MISCELLANEOUS) IMPLANT
SNARE ROTATE MED OVAL 20MM (MISCELLANEOUS) IMPLANT
SNARE SHORT THROW 13M SML OVAL (MISCELLANEOUS) ×2 IMPLANT
SYR 50ML LL SCALE MARK (SYRINGE) ×1 IMPLANT
TRAP SPECIMEN MUCOUS 40CC (MISCELLANEOUS) IMPLANT
TUBING ENDO SMARTCAP PENTAX (MISCELLANEOUS) IMPLANT
TUBING IRRIGATION ENDOGATOR (MISCELLANEOUS) ×1 IMPLANT
WATER STERILE IRR 1000ML POUR (IV SOLUTION) ×1 IMPLANT

## 2013-08-02 NOTE — Preoperative (Signed)
Beta Blockers   Reason not to administer Beta Blockers:Not Applicable 

## 2013-08-02 NOTE — Op Note (Signed)
Fentanyl 50 mcg given to amy adams crna to take to the operating room

## 2013-08-02 NOTE — Interval H&P Note (Signed)
History and Physical Interval Note:  08/02/2013 10:28 AM  Christa See  has presented today for surgery, with the diagnosis of ANOREXIA,EPIGASTRIC PAIN, CIRRHOSIS  The various methods of treatment have been discussed with the patient and family. After consideration of risks, benefits and other options for treatment, the patient has consented to  Procedure(s): COLONOSCOPY WITH PROPOFOL (N/A) ESOPHAGOGASTRODUODENOSCOPY (EGD) WITH PROPOFOL (N/A) as a surgical intervention .  The patient's history has been reviewed, patient examined, no change in status, stable for surgery.  I have reviewed the patient's chart and labs.  Questions were answered to the patient's satisfaction.    No change. EGD and colonoscopy per plan.The risks, benefits, limitations, imponderables and alternatives regarding both EGD and colonoscopy have been reviewed with the patient. Questions have been answered. All parties agreeable.    Eula Listen

## 2013-08-02 NOTE — H&P (View-Only) (Signed)
Primary Care Physician: FANTA,TESFAYE, MD  Primary Gastroenterologist:  Michael Rourk, MD   Chief Complaint  Patient presents with  . Follow-up    HPI: Sabrina Wyatt is a 62 y.o. female here for f/u. Last seen 05/11/13 by Dr. Rourk. 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 History of chronic abdominal pain related to complicated gallbladder surgery remotely. Sees Dr. Harkin in [place] for pain management. 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 She had surgery by Dr. Clancy Clark at [facility] in October 2013. She had an exploratory laparotomy with resection of jejunojejunostomy, end-to-end 2 layer handsewn jejunojejunostomy, lysis of adhesions. No evidence of small bowel mass.   Saw Dr. Rourk on 05/11/2013. 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 Repeat CT scan showed marked enlargement of the lateral segment of the left lobe, surface nodularity, small right lower lobe nodule, stable, followup CT scan in one year recommended, nothing to explain pain. Epigastric mass likely due to enlarged lateral segment of the left liver lobe.  She complains of swelling and bloating since her surgery. Her weight is up about 9 pounds since September 2013. Continues to have significant postprandial abdominal swelling, early satiety. Feels like pulling in lower abd and then stinging pain like when a rubber band is released. Denies pruritus or edema. Denies heartburn, vomiting, constipation, diarrhea, melena, rectal bleeding. Stretching/knotting type epigastric pain. No heartburn. Myelogram for back recommended, tightening of nerves in upper back. Dr. Harkins tomorrow, ?injections of abdominal wall pain? No postprandial pain. Appetite lessened.  Nothing she eats affects the pain.   Current Outpatient Prescriptions  Medication Sig Dispense Refill  . albuterol (PROVENTIL) (2.5 MG/3ML) 0.083% nebulizer solution Take 2.5 mg by  nebulization every 6 (six) hours as needed for wheezing or shortness of breath.      . atenolol (TENORMIN) 25 MG tablet Take 25 mg by mouth daily.        . DULoxetine (CYMBALTA) 60 MG capsule Take 60 mg by mouth 2 (two) times daily.       . EPIPEN 2-PAK 0.3 MG/0.3ML SOAJ injection       . etodolac (LODINE) 400 MG tablet Take 400 mg by mouth daily.       . fluticasone (FLONASE) 50 MCG/ACT nasal spray Place 2 sprays into the nose daily.        . lidocaine (LIDODERM) 5 % Place 1 patch onto the skin daily. Remove & Discard patch within 12 hours or as directed by MD      . loratadine (CLARITIN) 10 MG tablet Take 10 mg by mouth daily.      . omeprazole (PRILOSEC) 20 MG capsule TAKE ONE CAPSULE BY MOUTH EVERY DAY  30 capsule  11  . Oxycodone HCl 10 MG TABS Take 10 mg by mouth every 6 (six) hours.      . PROAIR HFA 108 (90 BASE) MCG/ACT inhaler Inhale 1 puff into the lungs every 6 (six) hours as needed.        No current facility-administered medications for this visit.    Allergies as of 07/10/2013  . (No Known Allergies)   Past Medical History  Diagnosis Date  . GERD (gastroesophageal reflux disease)   . Cirrhosis, hepatitis C     CT on 03/26/13= no HCC, pt has been vaccinated for Hep A and Hep B. s/p treatment of HCV  . Depression   . Carpal tunnel syndrome   . S/P endoscopy   08/27/10    antral erosions, otherwise normal, due egd 08/2012 to screen for varices  . Abdominal wall pain     chronic  . Pulmonary nodules   . PTSD (post-traumatic stress disorder)   . Chronic low back pain   . 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 Polysubstance abuse     HX of  . S/P colonoscopy 2005    Dr Rourk-1 polyp removed, otherwise normal  . Hypertension   . Tobacco abuse   . Intussusception 04/2012   Past Surgical History  Procedure Laterality Date  . Cholecystectomy  2002  . Percutaneous transhepatic cholangiograhpy and billiary drainage  2002  . Roux-en-y-hepatojejunostomy  2003  .  Spinal fusion      C5-C7  . Carpal tunnel release  12/2010  . Esophagogastroduodenoscopy    09/10/2003    Small hiatal hernia; otherwise normal stomach, normal D1 and D2  . Colonoscopy    09/10/2003    diminutive polyp in the rectum cold biopsied/removed/ Normal colon  . Esophagogastroduodenoscopy  08/27/2010    RMR:Normal esophagus.  No varices.  Couple of tiny antral erosions of doubtful clinical significance, otherwise normal stomach, D1 and D2.   Family History  Problem Relation Age of Onset  . Coronary artery disease Brother   . Breast cancer Sister    History   Social History  . Marital Status: Single    Spouse Name: N/A    Number of Children: 1  . Years of Education: N/A   Occupational History  . disabled    Social History Main Topics  . Smoking status: Current Some Day Smoker -- 0.25 packs/day for 30 years    Types: Cigarettes  . Smokeless tobacco: Never Used     Comment: One pack every 3 days  . Alcohol Use: Yes     Comment: social   . Drug Use: No     Comment: remote in teens-marijuana, heroin, cocaine  . Sexual Activity: No   Other Topics Concern  . None   Social History Narrative  . None     ROS:  General: Negative for weight loss, fever, chills, fatigue, weakness. See history of present illness ENT: Negative for hoarseness, difficulty swallowing , nasal congestion. CV: Negative for chest pain, angina, palpitations, dyspnea on exertion, peripheral edema.  Respiratory: Negative for dyspnea at rest, dyspnea on exertion, cough, sputum, wheezing.  GI: See history of present illness. GU:  Negative for dysuria, hematuria, urinary incontinence, urinary frequency, nocturnal urination.  Endo: Negative for unusual weight change.    Physical Examination:   BP 146/89  Pulse 66  Temp(Src) 97.6 F (36.4 C) (Oral)  Ht 5' 5" (1.651 m)  Wt 154 lb 12.8 oz (70.217 kg)  BMI 25.76 kg/m2  General: Well-nourished, well-developed in no acute distress.  Eyes: No  icterus. Mouth: Oropharyngeal mucosa moist and pink , no lesions erythema or exudate. Lungs: Clear to auscultation bilaterally.  Heart: Regular rate and rhythm, no murmurs rubs or gallops.  Abdomen: Bowel sounds are normal, tenderness noted in the epigastrium. Well-healed abdominal incisions. nondistended, no hepatosplenomegaly or masses, no abdominal bruits or hernia , no rebound or guarding.   Extremities: No lower extremity edema. No clubbing or deformities. Neuro: Alert and oriented x 4   Skin: Warm and dry, no jaundice.   Psych: Alert and cooperative, normal mood and affect.        

## 2013-08-02 NOTE — Op Note (Signed)
[facility] [address] [place], [address]   COLONOSCOPY PROCEDURE REPORT  PATIENT: Sabrina, Wyatt  MR#:         [id number] BIRTHDATE: [birth year] , 62  yrs. old GENDER: Female ENDOSCOPIST: R.  Roetta Sessions, MD FACP FACG REFERRED BY:  Glenice Laine, M.D. PROCEDURE DATE:  08/02/2013 PROCEDURE:     Screening colonoscopy  INDICATIONS: Average risk colorectal cancer screening examination to  INFORMED CONSENT:  The risks, benefits, alternatives and imponderables including but not limited to bleeding, perforation as well as the possibility of a missed lesion have been reviewed.  The potential for biopsy, lesion removal, etc. have also been discussed.  Questions have been answered.  All parties agreeable. Please see the history and physical in the medical record for more information.  MEDICATIONS: Deep sedation per Dr. [facility]  DESCRIPTION OF PROCEDURE:  After a digital rectal exam was performed, the     colonoscope was advanced from the anus through the rectum and colon to the area of the cecum, ileocecal valve and appendiceal orifice.  The cecum was deeply intubated.  These structures were well-seen and photographed for the record.  From the level of the cecum and ileocecal valve, the scope was slowly and cautiously withdrawn.  The mucosal surfaces were carefully surveyed utilizing scope tip deflection to facilitate fold flattening as needed.  The scope was pulled down into the rectum where a thorough examination including retroflexion was performed.     FINDINGS:  Adequate preparation. Normal rectum. Few scattered left-sided diverticula; the remainder of the colonic mucosa appeared normal  THERAPEUTIC / DIAGNOSTIC MANEUVERS PERFORMED:  None  COMPLICATIONS: none  CECAL WITHDRAWAL TIME:  10 minutes  IMPRESSION:  Colonic diverticulosis.  RECOMMENDATIONS: Repeat colonoscopy for screening purposes in  10 years.   _______________________________ eSigned:  R. Roetta Sessions, MD FACP [facility] 08/02/2013 1:43 PM   CC:

## 2013-08-02 NOTE — Transfer of Care (Signed)
Immediate Anesthesia Transfer of Care Note  Patient: Sabrina Wyatt  Procedure(s) Performed: Procedure(s) with comments: ESOPHAGOGASTRODUODENOSCOPY (EGD) WITH PROPOFOL (N/A) COLONOSCOPY WITH PROPOFOL (N/A) - in cecum at 1103; total withdrawal time = 10 minutes  Patient Location: PACU  Anesthesia Type:MAC  Level of Consciousness: awake, alert , oriented and patient cooperative  Airway & Oxygen Therapy: Patient Spontanous Breathing  Post-op Assessment: Report given to PACU RN and Post -op Vital signs reviewed and stable  Post vital signs: Reviewed and stable  Complications: No apparent anesthesia complications

## 2013-08-02 NOTE — Anesthesia Preprocedure Evaluation (Signed)
Anesthesia Evaluation  Patient identified by MRN, date of birth, ID band Patient awake    Reviewed: Allergy & Precautions, H&P , NPO status , Patient's Chart, lab work & pertinent test results  Airway Mallampati: II TM Distance: <3 FB Neck ROM: Full    Dental  (+) Edentulous Upper   Pulmonary shortness of breath, Current Smoker,  breath sounds clear to auscultation        Cardiovascular hypertension, Pt. on medications Rhythm:Regular Rate:Normal     Neuro/Psych Seizures -,  PSYCHIATRIC DISORDERS (PTSD) Depression  Neuromuscular disease    GI/Hepatic GERD-  Medicated,(+) Cirrhosis -    substance abuse  alcohol use, Hepatitis -, C  Endo/Other    Renal/GU      Musculoskeletal   Abdominal   Peds  Hematology   Anesthesia Other Findings   Reproductive/Obstetrics                           Anesthesia Physical Anesthesia Plan  ASA: IV  Anesthesia Plan: MAC   Post-op Pain Management:    Induction: Intravenous  Airway Management Planned: Simple Face Mask  Additional Equipment:   Intra-op Plan:   Post-operative Plan:   Informed Consent: I have reviewed the patients History and Physical, chart, labs and discussed the procedure including the risks, benefits and alternatives for the proposed anesthesia with the patient or authorized representative who has indicated his/her understanding and acceptance.     Plan Discussed with:   Anesthesia Plan Comments:         Anesthesia Quick Evaluation

## 2013-08-02 NOTE — Op Note (Signed)
[facility] [address] [place], [address]   ENDOSCOPY PROCEDURE REPORT  PATIENT: Sabrina, Wyatt  MR#: [id number] BIRTHDATE: 1951/05/18 , 62  yrs. old GENDER: Female ENDOSCOPIST: R.  Roetta Sessions, MD FACP FACG REFERRED BY:  Glenice Laine, M.D. PROCEDURE DATE:  08/02/2013 PROCEDURE:     Diagnostic EGD  INDICATIONS:     abdominal pain  INFORMED CONSENT:   The risks, benefits, limitations, alternatives and imponderables have been discussed.  The potential for biopsy, esophogeal dilation, etc. have also been reviewed.  Questions have been answered.  All parties agreeable.  Please see the history and physical in the medical record for more information.  MEDICATIONS:    deep sedation per Dr. [facility]  DESCRIPTION OF PROCEDURE:   The     endoscope was introduced through the mouth and advanced to the second portion of the duodenum without difficulty or limitations.  The mucosal surfaces were surveyed very carefully during advancement of the scope and upon withdrawal.  Retroflexion view of the proximal stomach and esophagogastric junction was performed.      FINDINGS:    Normal esophagus. No varices. Stomach empty. Diffuse "snake skinning" or "fishscale" appearance of the gastric mucosa. No ulcer or infiltrating process. Patent pylorus. Normal first and second portion of the duodenum.  THERAPEUTIC / DIAGNOSTIC MANEUVERS PERFORMED:  None   COMPLICATIONS:  None  IMPRESSION:   Portal gastropathy. No explanation for abdominal pain which I believe is more abdominal wall in origin. She has been seen by Dr. Chestine Spore  over at [facility]. She is up-to-date on cross sectional imaging. She has a pain management physician in [place].  RECOMMENDATIONS:      Continue acid suppression with omeprazole for GERD. Followup with pain management regarding abdominal wall pain.  Repeat EGD to check for varices in about 3 years. Office visit with [place] in 6  months.    _______________________________ R. Roetta Sessions, MD FACP [facility] eSigned:  R. Roetta Sessions, MD FACP [facility] 08/02/2013 1:40 PM     CC:

## 2013-08-02 NOTE — Anesthesia Postprocedure Evaluation (Signed)
  Anesthesia Post-op Note  Patient: Sabrina Wyatt  Procedure(s) Performed: Procedure(s) with comments: ESOPHAGOGASTRODUODENOSCOPY (EGD) WITH PROPOFOL (N/A) COLONOSCOPY WITH PROPOFOL (N/A) - in cecum at 1103; total withdrawal time = 10 minutes  Patient Location: PACU  Anesthesia Type:MAC  Level of Consciousness: awake, alert , oriented and patient cooperative  Airway and Oxygen Therapy: Patient Spontanous Breathing  Post-op Pain: none  Post-op Assessment: Post-op Vital signs reviewed, Patient's Cardiovascular Status Stable, Respiratory Function Stable, Patent Airway, No signs of Nausea or vomiting and Pain level controlled  Post-op Vital Signs: Reviewed and stable  Complications: No apparent anesthesia complications

## 2013-08-03 ENCOUNTER — Encounter (HOSPITAL_COMMUNITY): Payer: Self-pay | Admitting: Internal Medicine

## 2013-08-23 HISTORY — PX: OTHER SURGICAL HISTORY: SHX169

## 2013-10-04 ENCOUNTER — Ambulatory Visit: Payer: PRIVATE HEALTH INSURANCE | Admitting: Gastroenterology

## 2013-10-11 ENCOUNTER — Ambulatory Visit: Payer: PRIVATE HEALTH INSURANCE | Admitting: Physical Therapy

## 2013-10-18 ENCOUNTER — Ambulatory Visit: Payer: PRIVATE HEALTH INSURANCE | Admitting: Physical Therapy

## 2013-10-19 ENCOUNTER — Ambulatory Visit (HOSPITAL_COMMUNITY): Payer: PRIVATE HEALTH INSURANCE | Admitting: Psychiatry

## 2013-10-22 ENCOUNTER — Ambulatory Visit: Payer: PRIVATE HEALTH INSURANCE | Admitting: Gastroenterology

## 2013-10-25 ENCOUNTER — Ambulatory Visit: Payer: PRIVATE HEALTH INSURANCE | Admitting: Physical Therapy

## 2013-11-01 ENCOUNTER — Emergency Department (HOSPITAL_COMMUNITY): Payer: PRIVATE HEALTH INSURANCE

## 2013-11-01 ENCOUNTER — Ambulatory Visit: Payer: PRIVATE HEALTH INSURANCE | Admitting: Physical Therapy

## 2013-11-01 ENCOUNTER — Emergency Department (HOSPITAL_COMMUNITY)
Admission: EM | Admit: 2013-11-01 | Discharge: 2013-11-01 | Disposition: A | Discharge status: Other Acute Inpatient Hospital | Payer: PRIVATE HEALTH INSURANCE | Attending: General Surgery | Admitting: General Surgery

## 2013-11-01 ENCOUNTER — Encounter (HOSPITAL_COMMUNITY): Payer: Self-pay | Admitting: Emergency Medicine

## 2013-11-01 DIAGNOSIS — Z860100 Personal history of colon polyps, unspecified: Secondary | ICD-10-CM | POA: Insufficient documentation

## 2013-11-01 DIAGNOSIS — K219 Gastro-esophageal reflux disease without esophagitis: Secondary | ICD-10-CM | POA: Insufficient documentation

## 2013-11-01 DIAGNOSIS — M545 Low back pain, unspecified: Secondary | ICD-10-CM | POA: Insufficient documentation

## 2013-11-01 DIAGNOSIS — Z9089 Acquired absence of other organs: Secondary | ICD-10-CM | POA: Insufficient documentation

## 2013-11-01 DIAGNOSIS — K838 Other specified diseases of biliary tract: Secondary | ICD-10-CM

## 2013-11-01 DIAGNOSIS — F1410 Cocaine abuse, uncomplicated: Secondary | ICD-10-CM

## 2013-11-01 DIAGNOSIS — F3289 Other specified depressive episodes: Secondary | ICD-10-CM | POA: Insufficient documentation

## 2013-11-01 DIAGNOSIS — R109 Unspecified abdominal pain: Secondary | ICD-10-CM

## 2013-11-01 DIAGNOSIS — R52 Pain, unspecified: Secondary | ICD-10-CM | POA: Insufficient documentation

## 2013-11-01 DIAGNOSIS — I10 Essential (primary) hypertension: Secondary | ICD-10-CM | POA: Insufficient documentation

## 2013-11-01 DIAGNOSIS — Z9889 Other specified postprocedural states: Secondary | ICD-10-CM | POA: Insufficient documentation

## 2013-11-01 DIAGNOSIS — F329 Major depressive disorder, single episode, unspecified: Secondary | ICD-10-CM | POA: Insufficient documentation

## 2013-11-01 DIAGNOSIS — F17200 Nicotine dependence, unspecified, uncomplicated: Secondary | ICD-10-CM | POA: Insufficient documentation

## 2013-11-01 DIAGNOSIS — R945 Abnormal results of liver function studies: Secondary | ICD-10-CM

## 2013-11-01 DIAGNOSIS — Z79899 Other long term (current) drug therapy: Secondary | ICD-10-CM | POA: Insufficient documentation

## 2013-11-01 DIAGNOSIS — G8929 Other chronic pain: Secondary | ICD-10-CM | POA: Insufficient documentation

## 2013-11-01 DIAGNOSIS — R5383 Other fatigue: Secondary | ICD-10-CM

## 2013-11-01 DIAGNOSIS — R7989 Other specified abnormal findings of blood chemistry: Secondary | ICD-10-CM | POA: Insufficient documentation

## 2013-11-01 DIAGNOSIS — IMO0002 Reserved for concepts with insufficient information to code with codable children: Secondary | ICD-10-CM | POA: Insufficient documentation

## 2013-11-01 DIAGNOSIS — Z8619 Personal history of other infectious and parasitic diseases: Secondary | ICD-10-CM | POA: Insufficient documentation

## 2013-11-01 DIAGNOSIS — Z791 Long term (current) use of non-steroidal anti-inflammatories (NSAID): Secondary | ICD-10-CM | POA: Insufficient documentation

## 2013-11-01 DIAGNOSIS — R5381 Other malaise: Secondary | ICD-10-CM | POA: Insufficient documentation

## 2013-11-01 HISTORY — DX: Other chronic pain: G89.29

## 2013-11-01 HISTORY — DX: Unspecified abdominal pain: R10.9

## 2013-11-01 LAB — URINALYSIS, ROUTINE W REFLEX MICROSCOPIC
Bilirubin Urine: NEGATIVE
Glucose, UA: NEGATIVE mg/dL
Hgb urine dipstick: NEGATIVE
Ketones, ur: NEGATIVE mg/dL
Leukocytes, UA: NEGATIVE
Nitrite: NEGATIVE
Protein, ur: NEGATIVE mg/dL
Specific Gravity, Urine: 1.015 (ref 1.005–1.030)
Urobilinogen, UA: 1 mg/dL (ref 0.0–1.0)
pH: 8.5 — ABNORMAL HIGH (ref 5.0–8.0)

## 2013-11-01 LAB — RAPID URINE DRUG SCREEN, HOSP PERFORMED
Amphetamines: NOT DETECTED
Barbiturates: NOT DETECTED
Benzodiazepines: NOT DETECTED
Cocaine: POSITIVE — AB
Opiates: NOT DETECTED
Tetrahydrocannabinol: NOT DETECTED

## 2013-11-01 LAB — AMMONIA: Ammonia: 60 umol/L (ref 11–60)

## 2013-11-01 LAB — COMPREHENSIVE METABOLIC PANEL
ALT: 188 U/L — ABNORMAL HIGH (ref 0–35)
AST: 317 U/L — ABNORMAL HIGH (ref 0–37)
Albumin: 3.7 g/dL (ref 3.5–5.2)
Alkaline Phosphatase: 382 U/L — ABNORMAL HIGH (ref 39–117)
BUN: 8 mg/dL (ref 6–23)
CO2: 24 mEq/L (ref 19–32)
Calcium: 9.6 mg/dL (ref 8.4–10.5)
Chloride: 100 mEq/L (ref 96–112)
Creatinine, Ser: 0.63 mg/dL (ref 0.50–1.10)
GFR calc Af Amer: >90 mL/min (ref >90)
GFR calc non Af Amer: >90 mL/min (ref >90)
Glucose, Bld: 122 mg/dL — ABNORMAL HIGH (ref 70–99)
Potassium: 3.8 mEq/L (ref 3.7–5.3)
Sodium: 142 mEq/L (ref 137–147)
Total Bilirubin: 1.8 mg/dL — ABNORMAL HIGH (ref 0.3–1.2)
Total Protein: 7.6 g/dL (ref 6.0–8.3)

## 2013-11-01 LAB — CBC WITH DIFFERENTIAL/PLATELET
Basophils Absolute: 0 10*3/uL (ref 0.0–0.1)
Basophils Relative: 0 % (ref 0–1)
Eosinophils Absolute: 0 10*3/uL (ref 0.0–0.7)
Eosinophils Relative: 0 % (ref 0–5)
HCT: 41.6 % (ref 36.0–46.0)
Hemoglobin: 14.1 g/dL (ref 12.0–15.0)
Lymphocytes Relative: 15 % (ref 12–46)
Lymphs Abs: 1.7 10*3/uL (ref 0.7–4.0)
MCH: 32.9 pg (ref 26.0–34.0)
MCHC: 33.9 g/dL (ref 30.0–36.0)
MCV: 97.2 fL (ref 78.0–100.0)
Monocytes Absolute: 0.4 10*3/uL (ref 0.1–1.0)
Monocytes Relative: 3 % (ref 3–12)
Neutro Abs: 9.7 10*3/uL — ABNORMAL HIGH (ref 1.7–7.7)
Neutrophils Relative %: 82 % — ABNORMAL HIGH (ref 43–77)
Platelets: 160 10*3/uL (ref 150–400)
RBC: 4.28 MIL/uL (ref 3.87–5.11)
RDW: 13.4 % (ref 11.5–15.5)
WBC: 11.8 10*3/uL — ABNORMAL HIGH (ref 4.0–10.5)

## 2013-11-01 LAB — TROPONIN I: Troponin I: <0.3 ng/mL (ref <0.30)

## 2013-11-01 LAB — ETHANOL: Alcohol, Ethyl (B): <11 mg/dL (ref 0–11)

## 2013-11-01 LAB — LIPASE, BLOOD: Lipase: 26 U/L (ref 11–59)

## 2013-11-01 MED ORDER — IOHEXOL 300 MG/ML  SOLN
100.0000 mL | Freq: Once | INTRAMUSCULAR | Status: AC | PRN
  Administered 2013-11-01: 100 mL via INTRAVENOUS

## 2013-11-01 MED ORDER — MORPHINE SULFATE 4 MG/ML IJ SOLN
4.0000 mg | INTRAMUSCULAR | Status: DC | PRN
  Administered 2013-11-01: 4 mg via INTRAVENOUS
  Filled 2013-11-01: qty 1

## 2013-11-01 MED ORDER — FENTANYL CITRATE 0.05 MG/ML IJ SOLN
50.0000 ug | INTRAMUSCULAR | Status: DC | PRN
  Administered 2013-11-01: 50 ug via INTRAVENOUS
  Filled 2013-11-01: qty 2

## 2013-11-01 MED ORDER — SODIUM CHLORIDE 0.9 % IV SOLN
INTRAVENOUS | Status: DC
  Administered 2013-11-01: 14:00:00 via INTRAVENOUS

## 2013-11-01 MED ORDER — IOHEXOL 300 MG/ML  SOLN
50.0000 mL | Freq: Once | INTRAMUSCULAR | Status: AC | PRN
  Administered 2013-11-01: 50 mL via ORAL

## 2013-11-01 MED ORDER — ONDANSETRON HCL 4 MG/2ML IJ SOLN: 4.0000 mg | INTRAMUSCULAR | Status: DC | PRN

## 2013-11-01 NOTE — ED Notes (Signed)
Pt wanted water. Advised edp stated she was going to have ct abd and head and need to wait on results.

## 2013-11-01 NOTE — ED Notes (Signed)
Per EMS, pt's neighbor called them stating pt was having chest pain. When EMS arrived pt stated she was having low back pain. Pt was able to walk to the truck. Vomited one time in back of truck and has been combative ever since. Pt combative and agitated on arrival to ED. Pt incontinent of urine, frailing about in bed

## 2013-11-01 NOTE — ED Provider Notes (Signed)
CSN: [id number]     Arrival date & time 11/01/13  1005 History   First MD Initiated Contact with Patient 11/01/13 1048     Chief Complaint  Patient presents with  . Fatigue  . Back Pain  . Abdominal Pain      HPI Pt was seen at 1140.  Per pt, c/o gradual onset and persistence of constant right sided abd "pain" since this morning.  Has been associated with generalized weakness/fatigue, "constipation" as well as several intermittent episodes of N/V.  Describes the abd pain as "stabbing."  Denies diarrhea, no fevers, no flank pain, no rash, no CP/SOB, no black or blood in stools or emesis. Pt also c/o gradual onset and persistence of constant acute flair of her chronic low back "pain" for the past few days.  Denies any change in her usual chronic pain pattern.  Pain worsens with palpation of the area and body position changes. States she did not take her usual oxycodone today. Denies incont/retention of bowel or bladder, no saddle anesthesia, no focal motor weakness, no tingling/numbness in extremities, no fevers, no injury.    Past Medical History  Diagnosis Date  . GERD (gastroesophageal reflux disease)   . Cirrhosis, hepatitis C     CT on 03/26/13= no HCC, pt has been vaccinated for Hep A and Hep B. s/p treatment of HCV  . Depression   . Carpal tunnel syndrome   . S/P endoscopy 08/27/10    antral erosions, otherwise normal, due egd 08/2012 to screen for varices  . Abdominal wall pain     chronic  . Pulmonary nodules   . PTSD (post-traumatic stress disorder)   . Chronic low back pain   . 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 Polysubstance abuse     HX of  . S/P colonoscopy 2005    Dr Moss Mc polyp removed, otherwise normal  . Hypertension   . Tobacco abuse   . Intussusception 04/2012  . Shortness of breath   . Seizures 1998    with brain bleed x1. None since then and on no meds  . Chronic abdominal pain    Past Surgical History  Procedure Laterality Date  .  Cholecystectomy  2002  . Percutaneous transhepatic cholangiograhpy and billiary drainage  2002  . Roux-en-y-hepatojejunostomy  2003    revision in 2013 for intussusception [facility] Dr. Bailey Mech)  . Spinal fusion      C5-C7  . Carpal tunnel release Bilateral 12/2010  . Esophagogastroduodenoscopy    09/10/2003    Small hiatal hernia; otherwise normal stomach, normal D1 and D2  . Colonoscopy    09/10/2003    diminutive polyp in the rectum cold biopsied/removed/ Normal colon  . Esophagogastroduodenoscopy  08/27/2010    [id number] esophagus.  No varices.  Couple of tiny antral erosions of doubtful clinical significance, otherwise normal stomach, D1 and D2.  . Esophagogastroduodenoscopy (egd) with propofol N/A 08/02/2013    RMR: Portal gastropathy. No explanation for abdominal pain which I believe is more abdominal wall in origin. She has been seen by Dr. Carlis Abbott  over at [facility]. She is up-to-date on cross sectional imaging. She has a pain management physician in [place]  . Colonoscopy with propofol N/A 08/02/2013    [id number] diverticulosis   Family History  Problem Relation Age of Onset  . Coronary artery disease Brother   . Breast cancer Sister    History  Substance Use Topics  . Smoking status: Current Some Day Smoker --  0.25 packs/day for 30 years    Types: Cigarettes  . Smokeless tobacco: Never Used     Comment: One pack every 3 days  . Alcohol Use: Yes     Comment: social     Review of Systems ROS: Statement: All systems negative except as marked or noted in the HPI; Constitutional: Negative for fever and chills. ; ; Eyes: Negative for eye pain, redness and discharge. ; ; ENMT: Negative for ear pain, hoarseness, nasal congestion, sinus pressure and sore throat. ; ; Cardiovascular: Negative for chest pain, palpitations, diaphoresis, dyspnea and peripheral edema. ; ; Respiratory: Negative for cough, wheezing and stridor. ; ; Gastrointestinal: +abd pain, N/V. Negative for  diarrhea, blood in stool, hematemesis, jaundice and rectal bleeding. . ; ; Genitourinary: Negative for dysuria, flank pain and hematuria. ; ; Musculoskeletal: +chronic LBP. Negative for neck pain. Negative for swelling and trauma.; ; Skin: Negative for pruritus, rash, abrasions, blisters, bruising and skin lesion.; ; Neuro: Negative for headache, lightheadedness and neck stiffness. Negative for weakness, altered level of consciousness , altered mental status, extremity weakness, paresthesias, involuntary movement, seizure and syncope.     Allergies  Review of patient's allergies indicates no known allergies.  Home Medications   Current Outpatient Rx  Name  Route  Sig  Dispense  Refill  . atenolol (TENORMIN) 25 MG tablet   Oral   Take 25 mg by mouth daily.          . DULoxetine (CYMBALTA) 60 MG capsule   Oral   Take 60 mg by mouth 2 (two) times daily.          Marland Kitchen etodolac (LODINE) 400 MG tablet   Oral   Take 400 mg by mouth 2 (two) times daily.          . fluticasone (FLONASE) 50 MCG/ACT nasal spray   Nasal   Place 2 sprays into the nose daily.           Marland Kitchen lidocaine (LIDODERM) 5 %   Transdermal   Place 1 patch onto the skin daily. Remove & Discard patch within 12 hours or as directed by MD         . loratadine (CLARITIN) 10 MG tablet   Oral   Take 10 mg by mouth daily.         Marland Kitchen omeprazole (PRILOSEC) 20 MG capsule      TAKE ONE CAPSULE BY MOUTH EVERY DAY   30 capsule   11   . oxyCODONE (OXY IR/ROXICODONE) 5 MG immediate release tablet   Oral   Take 5 mg by mouth every 4 (four) hours as needed for moderate pain or severe pain.         Marland Kitchen PROAIR HFA 108 (90 BASE) MCG/ACT inhaler   Inhalation   Inhale 2 puffs into the lungs every 6 (six) hours as needed.          Marland Kitchen albuterol (PROVENTIL) (2.5 MG/3ML) 0.083% nebulizer solution   Nebulization   Take 2.5 mg by nebulization every 6 (six) hours as needed for wheezing or shortness of breath.         . EPIPEN  2-PAK 0.3 MG/0.3ML SOAJ injection                BP 114/58  Pulse 107  Temp(Src) 98.5 F (36.9 C) (Oral)  Resp 23  Ht 5\' 6"  (1.676 m)  Wt 150 lb (68.04 kg)  BMI 24.22 kg/m2  SpO2 95% Physical Exam 1145: Physical  examination:  Nursing notes reviewed; Vital signs and O2 SAT reviewed;  Constitutional: Well developed, Well nourished, Well hydrated, Uncomfortable appearing;; Head:  Normocephalic, atraumatic; Eyes: EOMI, PERRL, No scleral icterus; ENMT: Mouth and pharynx normal, Mucous membranes moist; Neck: Supple, Full range of motion, No lymphadenopathy; Cardiovascular: Regular rate and rhythm, No gallop; Respiratory: Breath sounds clear & equal bilaterally, No wheezes.  Speaking full sentences with ease, Normal respiratory effort/excursion; Chest: Nontender, Movement normal; Abdomen: Soft, +RUQ tenderness to palp. No rebound or guarding. Nondistended, Normal bowel sounds; Genitourinary: No CVA tenderness; Spine:  No midline CS, TS, LS tenderness. +TTP bilat lumbar paraspinal muscles. No rash.;; Extremities: Pulses normal, No tenderness, No edema, No calf edema or asymmetry.; Neuro: AA&Ox3, Major CN grossly intact. No facial droop. Speech clear. Grips equal. Strength 5/5 equal bilat UE's and LE's. No gross focal motor or sensory deficits in extremities.; Skin: Color normal, Warm, Dry.   ED Course  Procedures     EKG Interpretation   Date/Time:  Thursday November 01 2013 11:54:26 EDT Ventricular Rate:  105 PR Interval:  134 QRS Duration: 82 QT Interval:  360 QTC Calculation: 475 R Axis:   51 Text Interpretation:  Sinus tachycardia Otherwise normal ECG When compared  with ECG of 26-Jul-2013 11:19, No significant change was found Confirmed  by [facility]  MD, Nunzio Cory [phone]) on 11/01/2013 1:08:02 PM      MDM  MDM Reviewed: previous chart, nursing note and vitals Reviewed previous: labs Interpretation: labs, x-ray and CT scan    Results for orders placed during the hospital  encounter of 11/01/13  CBC WITH DIFFERENTIAL      Result Value Ref Range   WBC 11.8 (*) 4.0 - 10.5 K/uL   RBC 4.28  3.87 - 5.11 MIL/uL   Hemoglobin 14.1  12.0 - 15.0 g/dL   HCT 41.6  36.0 - 46.0 %   MCV 97.2  78.0 - 100.0 fL   MCH 32.9  26.0 - 34.0 pg   MCHC 33.9  30.0 - 36.0 g/dL   RDW 13.4  11.5 - 15.5 %   Platelets 160  150 - 400 K/uL   Neutrophils Relative % 82 (*) 43 - 77 %   Neutro Abs 9.7 (*) 1.7 - 7.7 K/uL   Lymphocytes Relative 15  12 - 46 %   Lymphs Abs 1.7  0.7 - 4.0 K/uL   Monocytes Relative 3  3 - 12 %   Monocytes Absolute 0.4  0.1 - 1.0 K/uL   Eosinophils Relative 0  0 - 5 %   Eosinophils Absolute 0.0  0.0 - 0.7 K/uL   Basophils Relative 0  0 - 1 %   Basophils Absolute 0.0  0.0 - 0.1 K/uL  COMPREHENSIVE METABOLIC PANEL      Result Value Ref Range   Sodium 142  137 - 147 mEq/L   Potassium 3.8  3.7 - 5.3 mEq/L   Chloride 100  96 - 112 mEq/L   CO2 24  19 - 32 mEq/L   Glucose, Bld 122 (*) 70 - 99 mg/dL   BUN 8  6 - 23 mg/dL   Creatinine, Ser 0.63  0.50 - 1.10 mg/dL   Calcium 9.6  8.4 - 10.5 mg/dL   Total Protein 7.6  6.0 - 8.3 g/dL   Albumin 3.7  3.5 - 5.2 g/dL   AST 317 (*) 0 - 37 U/L   ALT 188 (*) 0 - 35 U/L   Alkaline Phosphatase 382 (*) 39 - 117 U/L  Total Bilirubin 1.8 (*) 0.3 - 1.2 mg/dL   GFR calc non Af Amer >90  >90 mL/min   GFR calc Af Amer >90  >90 mL/min  URINALYSIS, ROUTINE W REFLEX MICROSCOPIC      Result Value Ref Range   Color, Urine YELLOW  YELLOW   APPearance CLEAR  CLEAR   Specific Gravity, Urine 1.015  1.005 - 1.030   pH 8.5 (*) 5.0 - 8.0   Glucose, UA NEGATIVE  NEGATIVE mg/dL   Hgb urine dipstick NEGATIVE  NEGATIVE   Bilirubin Urine NEGATIVE  NEGATIVE   Ketones, ur NEGATIVE  NEGATIVE mg/dL   Protein, ur NEGATIVE  NEGATIVE mg/dL   Urobilinogen, UA 1.0  0.0 - 1.0 mg/dL   Nitrite NEGATIVE  NEGATIVE   Leukocytes, UA NEGATIVE  NEGATIVE  URINE RAPID DRUG SCREEN (HOSP PERFORMED)      Result Value Ref Range   Opiates NONE DETECTED  NONE  DETECTED   Cocaine POSITIVE (*) NONE DETECTED   Benzodiazepines NONE DETECTED  NONE DETECTED   Amphetamines NONE DETECTED  NONE DETECTED   Tetrahydrocannabinol NONE DETECTED  NONE DETECTED   Barbiturates NONE DETECTED  NONE DETECTED  ETHANOL      Result Value Ref Range   Alcohol, Ethyl (B) <11  0 - 11 mg/dL  AMMONIA      Result Value Ref Range   Ammonia 60  11 - 60 umol/L  LIPASE, BLOOD      Result Value Ref Range   Lipase 26  11 - 59 U/L  TROPONIN I      Result Value Ref Range   Troponin I <0.30  <0.30 ng/mL   Dg Chest 2 View 11/01/2013   CLINICAL DATA:  Altered mental status and cough  EXAM: CHEST  2 VIEW  COMPARISON:  May 10, 2012  FINDINGS: Lungs are clear. Heart size and pulmonary vascularity are normal. No adenopathy. There is postoperative change in the lower cervical spine.  IMPRESSION: No edema or consolidation.   Electronically Signed   By: Lowella Grip M.D.   On: 11/01/2013 13:43   Ct Head Wo Contrast 11/01/2013   CLINICAL DATA:  Altered mental status.  EXAM: CT HEAD WITHOUT CONTRAST  TECHNIQUE: Contiguous axial images were obtained from the base of the skull through the vertex without intravenous contrast.  COMPARISON:  Brain MRI 09/11/2010  FINDINGS: Skull and Sinuses:No significant abnormality.  Orbits: No acute abnormality.  Brain: No evidence of acute abnormality, such as acute infarction, hemorrhage, hydrocephalus, or mass lesion/mass effect. There is chronic small vessel ischemia with patchy deep cerebral white matter low-attenuation. Remote lacunar infarct in the right thalamus, hemorrhagic based on previous MRI.  IMPRESSION: 1. No evidence of acute intracranial disease. 2. Chronic small vessel disease.   Electronically Signed   By: Jorje Guild M.D.   On: 11/01/2013 13:53   Ct Abdomen Pelvis W Contrast 11/01/2013   CLINICAL DATA:  Abdominal pain.  EXAM: CT ABDOMEN AND PELVIS WITH CONTRAST  TECHNIQUE: Multidetector CT imaging of the abdomen and pelvis was  performed using the standard protocol following bolus administration of intravenous contrast.  CONTRAST:  14mL OMNIPAQUE IOHEXOL 300 MG/ML SOLN, 140mL OMNIPAQUE IOHEXOL 300 MG/ML SOLN  COMPARISON:  05/28/2013  FINDINGS: BODY WALL: Unremarkable.  LOWER CHEST: Chronic enlargement of subdiaphragmatic nodes, the largest on the left measuring 23 x 10 mm. These appear larger, possibly from liver inflammation.  ABDOMEN/PELVIS:  Liver: Hypertrophy of the left lobe liver and atrophy of the right. There is  differential perfusion of the liver. These changes changes could be postoperative or post obstructive given cholecystectomy with hepaticojejunostomy. The left biliary system is chronically enlarged, although there has been increased with the left hepatic duct measuring up to 1 cm, previously 5 mm. There is questionable high-density intraductal material near the hepaticojejunostomy (see coronal and sagittal).  Pancreas: Unremarkable.  Spleen: Unremarkable.  Adrenals: Unremarkable.  Kidneys and ureters: No obstructing ureteral stone. No hydronephrosis.  Bladder: Distended.  No wall thickening.  Reproductive: Unremarkable.  Bowel: No obstruction. Normal appendix.  Retroperitoneum: No mass or adenopathy.  Peritoneum: No free fluid or gas.  Vascular: No acute abnormality.  OSSEOUS: Lower lumbar posterior lumbar interbody fusion with rod and pedicle screw fixation. Interbody fusion is complete.  IMPRESSION: Hepaticojejunostomy. The hypertrophied left liver has worsening biliary ductal enlargement, with possible debris or noncalcified stone near the bowel anastomosis.   Electronically Signed   By: Jorje Guild M.D.   On: 11/01/2013 14:06   Results for VALERI, PETRICK (MRN [id number]) as of 11/01/2013 16:19  Ref. Range 05/20/2011 13:19 02/14/2013 10:40 11/01/2013 10:36  AST Latest Range: 0-37 U/L 17 39 (H) 317 (H)  ALT Latest Range: 0-35 U/L 16 38 (H) 188 (H)     1425:  On arrival, pt initially restless, agitated,  tremorous and yawning. Pt endorsed "not taking my oxy" today. UDS negative for opiates. Question possible opiate withdrawal on arrival, as pt is much calmer/cooperative after IV narcotic pain meds and is without further agitation, tremors or yawning. Pt requesting another dose of pain/nausea meds for her right sided abd pain. VS remain stable, afebrile. No further vomiting while in the ED. LFT's elevated from previous, CT with increasing left biliary system dilatation, as well as concern regarding possible debris near previous bowel anastomosis. T/C to [facility] Dr. Laural Golden, case discussed, including:  HPI, pertinent PM/SHx, VS/PE, dx testing, ED course and treatment:  [facility] does not have equipment to further evaluate/treat pt, requests to call [facility] GI MD, but will likely need to call pt's GI MD/Surgeon at [facility] who is familiar with pt. T/C to [facility] Dr. Olevia Perches, case discussed, including:  HPI, pertinent PM/SHx, VS/PE, dx testing, ED course and treatment:  Agrees with Dr. Laural Golden, pt will need further evaluation with ERCP/MRCP but will need [facility] center due to hx hepaticojejunostomy.  T/C to [facility] Dr. Bailey Mech, case discussed, including:  HPI, pertinent PM/SHx, VS/PE, dx testing, ED course and treatment:  He knows pt well, states pt may have stricture at site, he is agreeable to accept transfer, requests to bring CT images with pt. Dx and testing, as well as d/w Dr. Carlis Abbott, d/w pt.  Questions answered.  Verb understanding, agreeable to transfer/admit to [place], DO 11/03/13 1433

## 2013-11-01 NOTE — ED Notes (Signed)
EDP aware pt hollaring in pain and flailing all over bed.

## 2013-11-01 NOTE — ED Notes (Signed)
Spoke with PALS line and they will call us when we have a bed assignment.  Nurse informed.

## 2013-11-08 ENCOUNTER — Telehealth (HOSPITAL_COMMUNITY): Payer: Self-pay | Admitting: *Deleted

## 2013-11-12 ENCOUNTER — Ambulatory Visit (HOSPITAL_COMMUNITY): Payer: PRIVATE HEALTH INSURANCE | Admitting: Psychiatry

## 2013-11-15 ENCOUNTER — Ambulatory Visit (INDEPENDENT_AMBULATORY_CARE_PROVIDER_SITE_OTHER): Payer: PRIVATE HEALTH INSURANCE | Admitting: Gastroenterology

## 2013-11-15 ENCOUNTER — Encounter: Payer: Self-pay | Admitting: Gastroenterology

## 2013-11-15 ENCOUNTER — Encounter (INDEPENDENT_AMBULATORY_CARE_PROVIDER_SITE_OTHER): Payer: Self-pay

## 2013-11-15 VITALS — BP 142/76 | HR 111 | Temp 97.6°F | Ht 66.0 in | Wt 157.2 lb

## 2013-11-15 DIAGNOSIS — R600 Localized edema: Secondary | ICD-10-CM

## 2013-11-15 DIAGNOSIS — R609 Edema, unspecified: Secondary | ICD-10-CM

## 2013-11-15 DIAGNOSIS — K7460 Unspecified cirrhosis of liver: Secondary | ICD-10-CM

## 2013-11-15 DIAGNOSIS — K831 Obstruction of bile duct: Secondary | ICD-10-CM

## 2013-11-15 MED ORDER — SPIRONOLACTONE 50 MG PO TABS: 50.0000 mg | ORAL_TABLET | Freq: Every day | ORAL | Status: DC

## 2013-11-15 MED ORDER — FUROSEMIDE 20 MG PO TABS: 20.0000 mg | ORAL_TABLET | Freq: Every day | ORAL | Status: DC

## 2013-11-15 NOTE — Assessment & Plan Note (Signed)
Recent obstruction of left hepatobiliary tree due to stone s/p crushing and extraction by IR and placement of PTC. LFTs were much improved at time of discharged. Her abdominal pain has improved. She is flushing the PTC twice daily. She has small area of erythema at site likely reaction to foreign body. She will keep eye on this and if develops any drainage or spread of redness she will let someone know. Has follow up next week at [facility].

## 2013-11-15 NOTE — Assessment & Plan Note (Signed)
Recent lower extremity edema in setting of prolonged hospitalization. Discussed lower extremity edema and redness with Dr. Gala Romney. Likely due to edema rather than cellulitis. Doubt edema due to DVT bilaterally. No Homan's sign. Add lasix/aldactone. 2 gram sodium restricted diet. Recheck CMET in 10 days.   OV with Dr. Gala Romney 12/2013.

## 2013-11-15 NOTE — Patient Instructions (Signed)
1. Start lasix and aldactone one daily. 2. Get your labs done in 10 days to check kidney and liver function. 3. Call if you have worsening redness around your tube or your legs. 4. Call if your swelling does not improve. 5. Limit your salt/sodium intake to 2 grams daily. 6. I showed the pictures to Dr. Gala Romney. He states no need for antibiotics right now. 7. Office visit with Dr. Gala Romney in 12/2013.

## 2013-11-15 NOTE — Progress Notes (Signed)
Primary Care Physician: Rosita Fire, MD  Primary Gastroenterologist:  Garfield Cornea, MD   Chief Complaint  Patient presents with  . Follow-up    HPI: Sabrina Wyatt is a 63 y.o. female here for followup. Since her last office visit she had EGD and colonoscopy with findings as outlined below. 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 History of chronic abdominal pain related to complicated gallbladder surgery remotely. Sees Dr. Lovenia Shuck in [place] for pain management. 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 Surgery with Dr. Bailey Mech at [facility] in October 2013. She had exploratory laparotomy with resection of jejunojejunostomy, end-to-end 2 layer handsewn jejunojejunostomy, lysis of adhesions. No evidence of small bowel mass.   Saw Dr. Gala Romney on 05/11/2013. 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 Repeat CT scan showed marked enlargement of the lateral segment of the left lobe, surface nodularity, small right lower lobe nodule, stable, followup CT scan in one year recommended, nothing to explain pain. Epigastric mass likely due to enlarged lateral segment of the left liver lobe.  She had another CT the abdomen pelvis with contrast on March 12 for abdominal pain when he presented to the ER at [facility]. Noted to have hypertrophy of the left lobe liver and atrophy of the right. The left biliary system is chronically enlarged, although there is been increased with left hepatic duct measuring up to 1 cm, previously 5 mm. There is questionable high-density intraductal material near the hepaticojejunostomy. Noted to have a bump in her LFTs associated with this finding, total bilirubin 1.8, alkaline phosphatase 382, AST 317, ALT 188. Notably her urine drug screen was positive for cocaine.  Transferred to [facility]. MRI abdomen with and without contrast at [facility] showed progressive  atrophy of the right hepatic lobe from previous study in 2013, cirrhotic morphology the liver. Minimal dilation of the pancreatic duct and sidebranches. Intrahepatic biliary duct dilation the left hepatic lobe. 11 mm stone present at the origin of the left hepatic duct.  Small bowel endoscopy performed by Dr. Amalia Hailey which showed normal stomach and esophagus. Jejunal anastomosi were reached but the hepaticojejunostomy anastomosis could not be reached. Underwent ultrasound-guided percutaneous transhepatic cholangiogram. Large biliary stone obstructing left central bile duct. Successful fragmentation and extraction of obstructing biliary stone. Placement of bilateral 10 [place] modified APDL biliary drain with the locking pigtails formed within the proximal small bowel.  Weight up 3 pounds since last visit. The pain that sent her to ER was acute/new back pain. Thought she was going to die. After above intervention, her abdominal pain has improved significantly. Eating well. She is flushing her PTC twice daily. Follows up with IR at [facility] next week. She is worried about lower extremity edema and redness. Bowel movements are good. no melena, brbpr.    Current Outpatient Prescriptions  Medication Sig Dispense Refill  . albuterol (PROVENTIL) (2.5 MG/3ML) 0.083% nebulizer solution Take 2.5 mg by nebulization every 6 (six) hours as needed for wheezing or shortness of breath.      Marland Kitchen atenolol (TENORMIN) 25 MG tablet Take 25 mg by mouth daily.       . Cholecalciferol (VITAMIN D-3) 1000 UNITS CAPS Take 1,000 capsules by mouth daily.      . DULoxetine (CYMBALTA) 60 MG capsule Take 60 mg by mouth 2 (two) times daily.       Marland Kitchen EPIPEN 2-PAK 0.3 MG/0.3ML SOAJ injection       .  etodolac (LODINE) 400 MG tablet Take 400 mg by mouth 2 (two) times daily.       . fluticasone (FLONASE) 50 MCG/ACT nasal spray Place 2 sprays into the nose daily.        Marland Kitchen lidocaine (LIDODERM) 5 % Place 1 patch onto the skin daily. Remove &  Discard patch within 12 hours or as directed by MD      . loratadine (CLARITIN) 10 MG tablet Take 10 mg by mouth daily.      Marland Kitchen omeprazole (PRILOSEC) 20 MG capsule TAKE ONE CAPSULE BY MOUTH EVERY DAY  30 capsule  11  . Oxycodone HCl 20 MG TABS Take 20 mg by mouth every 3 (three) hours as needed.       Marland Kitchen PROAIR HFA 108 (90 BASE) MCG/ACT inhaler Inhale 2 puffs into the lungs every 6 (six) hours as needed.        No current facility-administered medications for this visit.    Allergies as of 11/15/2013  . (No Known Allergies)    ROS:  General: Negative for anorexia, weight loss, fever, chills, fatigue, weakness. ENT: Negative for hoarseness, difficulty swallowing , nasal congestion. CV: Negative for chest pain, angina, palpitations, dyspnea on exertion, peripheral edema.  Respiratory: Negative for dyspnea at rest, dyspnea on exertion, cough, sputum, wheezing.  GI: See history of present illness. GU:  Negative for dysuria, hematuria, urinary incontinence, urinary frequency, nocturnal urination.  Endo: Negative for unusual weight change.    Physical Examination:   BP 142/76  Pulse 111  Temp(Src) 97.6 F (36.4 C) (Oral)  Ht '5\' 6"'  (1.676 m)  Wt 157 lb 3.2 oz (71.305 kg)  BMI 25.38 kg/m2  General: Well-nourished, well-developed in no acute distress.  Eyes: No icterus. Mouth: Oropharyngeal mucosa moist and pink , no lesions erythema or exudate. Lungs: Clear to auscultation bilaterally.  Heart: Regular rate and rhythm, no murmurs rubs or gallops.  Abdomen: Bowel sounds are normal, nontender, nondistended, left hepatic lobe palpable. PTC in mid-abdomen, with nickel sized erythema at site but no drainage.    Extremities: 1-2+ pitting edema to knees with some erythema of toes bilaterally, mild dermatitis at ankles. No lower extremity edema. No clubbing or deformities. Neuro: Alert and oriented x 4   Skin: Warm and dry, no jaundice.   Psych: Alert and cooperative, normal mood and  affect.  Labs:  Lab Results  Component Value Date   WBC 11.8* 11/01/2013   HGB 14.1 11/01/2013   HCT 41.6 11/01/2013   MCV 97.2 11/01/2013   PLT 160 11/01/2013   Lab Results  Component Value Date   ALT 188* 11/01/2013   AST 317* 11/01/2013   ALKPHOS 382* 11/01/2013   BILITOT 1.8* 11/01/2013   Lab Results  Component Value Date   CREATININE 0.63 11/01/2013   BUN 8 11/01/2013   NA 142 11/01/2013   K 3.8 11/01/2013   CL 100 11/01/2013   CO2 24 11/01/2013     11/10/13: AP 178, AST 20, ALT 31, Tbili 0.4, creatinine 0.59   Imaging Studies: Dg Chest 2 View  11/01/2013   CLINICAL DATA:  Altered mental status and cough  EXAM: CHEST  2 VIEW  COMPARISON:  May 10, 2012  FINDINGS: Lungs are clear. Heart size and pulmonary vascularity are normal. No adenopathy. There is postoperative change in the lower cervical spine.  IMPRESSION: No edema or consolidation.   Electronically Signed   By: Lowella Grip M.D.   On: 11/01/2013 13:43   Ct Head  Wo Contrast  11/01/2013   CLINICAL DATA:  Altered mental status.  EXAM: CT HEAD WITHOUT CONTRAST  TECHNIQUE: Contiguous axial images were obtained from the base of the skull through the vertex without intravenous contrast.  COMPARISON:  Brain MRI 09/11/2010  FINDINGS: Skull and Sinuses:No significant abnormality.  Orbits: No acute abnormality.  Brain: No evidence of acute abnormality, such as acute infarction, hemorrhage, hydrocephalus, or mass lesion/mass effect. There is chronic small vessel ischemia with patchy deep cerebral white matter low-attenuation. Remote lacunar infarct in the right thalamus, hemorrhagic based on previous MRI.  IMPRESSION: 1. No evidence of acute intracranial disease. 2. Chronic small vessel disease.   Electronically Signed   By: Jorje Guild M.D.   On: 11/01/2013 13:53   Ct Abdomen Pelvis W Contrast  11/01/2013   CLINICAL DATA:  Abdominal pain.  EXAM: CT ABDOMEN AND PELVIS WITH CONTRAST  TECHNIQUE: Multidetector CT imaging of the  abdomen and pelvis was performed using the standard protocol following bolus administration of intravenous contrast.  CONTRAST:  45m OMNIPAQUE IOHEXOL 300 MG/ML SOLN, 1043mOMNIPAQUE IOHEXOL 300 MG/ML SOLN  COMPARISON:  05/28/2013  FINDINGS: BODY WALL: Unremarkable.  LOWER CHEST: Chronic enlargement of subdiaphragmatic nodes, the largest on the left measuring 23 x 10 mm. These appear larger, possibly from liver inflammation.  ABDOMEN/PELVIS:  Liver: Hypertrophy of the left lobe liver and atrophy of the right. There is differential perfusion of the liver. These changes changes could be postoperative or post obstructive given cholecystectomy with hepaticojejunostomy. The left biliary system is chronically enlarged, although there has been increased with the left hepatic duct measuring up to 1 cm, previously 5 mm. There is questionable high-density intraductal material near the hepaticojejunostomy (see coronal and sagittal).  Pancreas: Unremarkable.  Spleen: Unremarkable.  Adrenals: Unremarkable.  Kidneys and ureters: No obstructing ureteral stone. No hydronephrosis.  Bladder: Distended.  No wall thickening.  Reproductive: Unremarkable.  Bowel: No obstruction. Normal appendix.  Retroperitoneum: No mass or adenopathy.  Peritoneum: No free fluid or gas.  Vascular: No acute abnormality.  OSSEOUS: Lower lumbar posterior lumbar interbody fusion with rod and pedicle screw fixation. Interbody fusion is complete.  IMPRESSION: Hepaticojejunostomy. The hypertrophied left liver has worsening biliary ductal enlargement, with possible debris or noncalcified stone near the bowel anastomosis.   Electronically Signed   By: JoJorje Guild.D.   On: 11/01/2013 14:06

## 2013-11-19 NOTE — Progress Notes (Signed)
cc'd to pcp 

## 2013-11-22 DIAGNOSIS — K831 Obstruction of bile duct: Secondary | ICD-10-CM | POA: Insufficient documentation

## 2013-11-27 ENCOUNTER — Ambulatory Visit (HOSPITAL_COMMUNITY): Payer: Self-pay | Admitting: Psychiatry

## 2013-11-28 DIAGNOSIS — T85520A Displacement of bile duct prosthesis, initial encounter: Secondary | ICD-10-CM | POA: Insufficient documentation

## 2013-12-01 DIAGNOSIS — K838 Other specified diseases of biliary tract: Secondary | ICD-10-CM | POA: Insufficient documentation

## 2013-12-01 DIAGNOSIS — D6949 Other primary thrombocytopenia: Secondary | ICD-10-CM | POA: Insufficient documentation

## 2013-12-01 DIAGNOSIS — K921 Melena: Secondary | ICD-10-CM | POA: Insufficient documentation

## 2014-02-13 ENCOUNTER — Ambulatory Visit: Payer: Self-pay | Admitting: Gastroenterology

## 2014-02-21 ENCOUNTER — Ambulatory Visit: Payer: Self-pay | Admitting: Physical Therapy

## 2014-03-04 ENCOUNTER — Ambulatory Visit: Payer: Self-pay | Admitting: Physical Therapy

## 2014-03-05 ENCOUNTER — Ambulatory Visit: Payer: PRIVATE HEALTH INSURANCE | Attending: Anesthesiology | Admitting: Physical Therapy

## 2014-03-05 DIAGNOSIS — M545 Low back pain, unspecified: Secondary | ICD-10-CM | POA: Insufficient documentation

## 2014-03-05 DIAGNOSIS — Z981 Arthrodesis status: Secondary | ICD-10-CM | POA: Diagnosis not present

## 2014-03-05 DIAGNOSIS — IMO0001 Reserved for inherently not codable concepts without codable children: Secondary | ICD-10-CM | POA: Insufficient documentation

## 2014-03-05 DIAGNOSIS — R5381 Other malaise: Secondary | ICD-10-CM | POA: Diagnosis not present

## 2014-03-07 ENCOUNTER — Encounter: Payer: Self-pay | Admitting: Obstetrics and Gynecology

## 2014-03-07 ENCOUNTER — Ambulatory Visit (INDEPENDENT_AMBULATORY_CARE_PROVIDER_SITE_OTHER): Payer: PRIVATE HEALTH INSURANCE | Admitting: Obstetrics and Gynecology

## 2014-03-07 VITALS — BP 130/84 | Ht 65.0 in | Wt 149.0 lb

## 2014-03-07 DIAGNOSIS — L738 Other specified follicular disorders: Secondary | ICD-10-CM

## 2014-03-07 DIAGNOSIS — L739 Follicular disorder, unspecified: Secondary | ICD-10-CM | POA: Insufficient documentation

## 2014-03-07 MED ORDER — DOXYCYCLINE HYCLATE 100 MG PO CAPS: 100.0000 mg | ORAL_CAPSULE | Freq: Two times a day (BID) | ORAL | Status: DC

## 2014-03-07 NOTE — Progress Notes (Signed)
This chart was scribed by Steva Colder, Medical Scribe, for Dr. Mallory Shirk on 03/07/14 at 3:07 PM. This chart was reviewed by Dr. Mallory Shirk for accuracy.    [facility] Visit  Patient name: Sabrina Wyatt MRN [id number]  Date of birth: Sep 29, 1950  CC & HPI:  Sabrina Wyatt is a 63 y.o. female presenting today for a progressively worsening knot to her right buttock, that she noticed a few months ago. She states that the knot is about the size of a dime.   ROS:  +Knot to the right buttock region Otherwise negative.  Pertinent History Reviewed:   Reviewed: Significant for  Medical         Past Medical History  Diagnosis Date  . GERD (gastroesophageal reflux disease)   . Cirrhosis, hepatitis C     CT on 03/26/13= no HCC, pt has been vaccinated for Hep A and Hep B. s/p treatment of HCV with eradication  . Depression   . Carpal tunnel syndrome   . S/P endoscopy 08/27/10    antral erosions, otherwise normal, due egd 08/2012 to screen for varices  . Abdominal wall pain     chronic  . Pulmonary nodules   . PTSD (post-traumatic stress disorder)   . Chronic low back pain   . 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 Polysubstance abuse     HX of  . S/P colonoscopy 2005    Dr Moss Mc polyp removed, otherwise normal  . Hypertension   . Tobacco abuse   . Intussusception 04/2012  . Shortness of breath   . Seizures 1998    with brain bleed x1. None since then and on no meds  . Chronic abdominal pain   . Biliary stone                               Surgical Hx:    Past Surgical History  Procedure Laterality Date  . Cholecystectomy  2002  . Percutaneous transhepatic cholangiograhpy and billiary drainage  2002  . Roux-en-y-hepatojejunostomy  2003    revision in 2013 for intussusception [facility] Dr. Bailey Mech)  . Spinal fusion      C5-C7  . Carpal tunnel release Bilateral 12/2010  . Esophagogastroduodenoscopy    09/10/2003    Small hiatal hernia; otherwise  normal stomach, normal D1 and D2  . Colonoscopy    09/10/2003    diminutive polyp in the rectum cold biopsied/removed/ Normal colon  . Esophagogastroduodenoscopy  08/27/2010    LID:CVUDTH esophagus.  No varices.  Couple of tiny antral erosions of doubtful clinical significance, otherwise normal stomach, D1 and D2.  . Esophagogastroduodenoscopy (egd) with propofol N/A 08/02/2013    RMR: Portal gastropathy. No explanation for abdominal pain which I believe is more abdominal wall in origin. She has been seen by Dr. Carlis Abbott  over at [facility]. She is up-to-date on cross sectional imaging. She has a pain management physician in [place]. Repeat EGD for varices in 3 years.  . Colonoscopy with propofol N/A 08/02/2013    YHO:OILNZVJ diverticulosis. next TCS 10 years.  . Biliary stone removal  2015   Medications: Reviewed & Updated - see associated section                      Current outpatient prescriptions:albuterol (PROVENTIL) (2.5 MG/3ML) 0.083% nebulizer solution, Take 2.5 mg by nebulization every 6 (six) hours as needed  for wheezing or shortness of breath., Disp: , Rfl: ;  atenolol (TENORMIN) 25 MG tablet, Take 25 mg by mouth daily. , Disp: , Rfl: ;  Cholecalciferol (VITAMIN D-3) 1000 UNITS CAPS, Take 1,000 capsules by mouth daily., Disp: , Rfl:  DULoxetine (CYMBALTA) 60 MG capsule, Take 60 mg by mouth 2 (two) times daily. , Disp: , Rfl: ;  EPIPEN 2-PAK 0.3 MG/0.3ML SOAJ injection, , Disp: , Rfl: ;  etodolac (LODINE) 400 MG tablet, Take 400 mg by mouth 2 (two) times daily. , Disp: , Rfl: ;  fluticasone (FLONASE) 50 MCG/ACT nasal spray, Place 2 sprays into the nose daily.  , Disp: , Rfl:  lidocaine (LIDODERM) 5 %, Place 1 patch onto the skin daily. Remove & Discard patch within 12 hours or as directed by MD, Disp: , Rfl: ;  loratadine (CLARITIN) 10 MG tablet, Take 10 mg by mouth daily., Disp: , Rfl: ;  Omega-3 Fatty Acids (FISH OIL PO), Take 300-1,000 capsules by mouth daily. Take 2 daily, Disp: , Rfl: ;   omeprazole (PRILOSEC) 20 MG capsule, TAKE ONE CAPSULE BY MOUTH EVERY DAY, Disp: 30 capsule, Rfl: 11 Oxycodone HCl 20 MG TABS, Take 5 mg by mouth every 3 (three) hours as needed. , Disp: , Rfl: ;  PROAIR HFA 108 (90 BASE) MCG/ACT inhaler, Inhale 2 puffs into the lungs every 6 (six) hours as needed. , Disp: , Rfl:    Social History: Reviewed -  reports that she has been smoking Cigarettes.  She has a 7.5 pack-year smoking history. She has never used smokeless tobacco.  Objective Findings:  Vitals: Blood pressure 130/84, height 5\' 5"  (1.651 m), weight 149 lb (67.586 kg).  Physical Examination: General appearance - alert, well appearing, and in no distress and oriented to person, place, and time Mental status - alert, oriented to person, place, and time, normal mood, behavior, speech, dress, motor activity, and thought processes Pelvic -   VULVA: vulvar tenderness over a 1 cm firm deep area of folliculitis noted 2 cm lateral to midline of perineum,  VAGINA: normal appearing vagina with normal color and discharge, no lesions   Assessment & Plan:   A.  1. Vulvar folliculitis right gluteal area.  P.  1. Doxycycline for 10 days.   2. Follow up in 4 weeks.

## 2014-03-11 ENCOUNTER — Ambulatory Visit: Payer: PRIVATE HEALTH INSURANCE | Admitting: Physical Therapy

## 2014-03-11 DIAGNOSIS — IMO0001 Reserved for inherently not codable concepts without codable children: Secondary | ICD-10-CM | POA: Diagnosis not present

## 2014-03-12 ENCOUNTER — Ambulatory Visit (INDEPENDENT_AMBULATORY_CARE_PROVIDER_SITE_OTHER): Payer: PRIVATE HEALTH INSURANCE | Admitting: Gastroenterology

## 2014-03-12 ENCOUNTER — Encounter: Payer: Self-pay | Admitting: Gastroenterology

## 2014-03-12 VITALS — BP 127/86 | HR 86 | Temp 98.1°F | Resp 18 | Ht 65.0 in | Wt 151.0 lb

## 2014-03-12 DIAGNOSIS — G4700 Insomnia, unspecified: Secondary | ICD-10-CM | POA: Insufficient documentation

## 2014-03-12 DIAGNOSIS — K7460 Unspecified cirrhosis of liver: Secondary | ICD-10-CM

## 2014-03-12 DIAGNOSIS — K831 Obstruction of bile duct: Secondary | ICD-10-CM

## 2014-03-12 DIAGNOSIS — K7469 Other cirrhosis of liver: Secondary | ICD-10-CM

## 2014-03-12 MED ORDER — ZOLPIDEM TARTRATE 5 MG PO TABS: 5.0000 mg | ORAL_TABLET | Freq: Every evening | ORAL | Status: DC | PRN

## 2014-03-12 NOTE — Progress Notes (Signed)
Primary Care Physician: Rosita Fire, MD  Primary Gastroenterologist:  Garfield Cornea, MD   Chief Complaint  Patient presents with  . Follow-up    HPI: Sabrina Wyatt is a 63 y.o. female here for follow up of chronic abdominal pain. Last seen in March of 2015.   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 History of chronic abdominal pain related to complicated gallbladder surgery remotely. Sees Dr. Lovenia Shuck in [place] for pain management. 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 Surgery with Dr. Bailey Mech at [facility] in October 2013. She had exploratory laparotomy with resection of jejunojejunostomy, end-to-end 2 layer handsewn jejunojejunostomy, lysis of adhesions. No evidence of small bowel mass.   CT the abdomen pelvis with contrast on March 12 for abdominal pain when he presented to the ER at [facility]. Noted to have hypertrophy of the left lobe liver and atrophy of the right. The left biliary system is chronically enlarged, although there is been increased with left hepatic duct measuring up to 1 cm, previously 5 mm. There is questionable high-density intraductal material near the hepaticojejunostomy. Noted to have a bump in her LFTs associated with this finding, total bilirubin 1.8, alkaline phosphatase 382, AST 317, ALT 188. Notably her urine drug screen was positive for cocaine.   Transferred to [facility]. MRI abdomen with and without contrast at [facility] showed progressive atrophy of the right hepatic lobe from previous study in 2013, cirrhotic morphology the liver. Minimal dilation of the pancreatic duct and sidebranches. Intrahepatic biliary duct dilation the left hepatic lobe. 11 mm stone present at the origin of the left hepatic duct.   Small bowel endoscopy performed by Dr. Amalia Hailey which showed normal stomach and esophagus. Jejunal anastomosi  were reached but the hepaticojejunostomy anastomosis could not be reached. Underwent ultrasound-guided percutaneous transhepatic cholangiogram. Large biliary stone obstructing left central bile duct. Successful fragmentation and extraction of obstructing biliary stone. Placement of bilateral 10 [place] modified APDL biliary drain with the locking pigtails formed within the proximal small bowel.  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  Since her last office visit she had her biliary drain removed. Her LFTs were normal. Clinically she feels at baseline. Continues to have chronic abdominal pain management Dr. Lovenia Shuck. No issues of bowel function. Denies any blood in the stool or melena. No episodes of jaundice, confusion, itching, vomiting, anorexia. Her weight is down about 6 pounds since March however.  Current Outpatient Prescriptions  Medication Sig Dispense Refill  . albuterol (PROVENTIL) (2.5 MG/3ML) 0.083% nebulizer solution Take 2.5 mg by nebulization every 6 (six) hours as needed for wheezing or shortness of breath.      Marland Kitchen atenolol (TENORMIN) 25 MG tablet Take 25 mg by mouth daily.       . Cholecalciferol (VITAMIN D-3) 1000 UNITS CAPS Take 1,000 capsules by mouth daily.      Marland Kitchen doxycycline (VIBRAMYCIN) 100 MG capsule Take 1 capsule (100 mg total) by mouth 2 (two) times daily.  14 capsule  0  . DULoxetine (CYMBALTA) 60 MG capsule Take 60 mg by mouth 2 (two) times daily.       Marland Kitchen EPIPEN 2-PAK 0.3 MG/0.3ML SOAJ injection       . etodolac (LODINE) 400 MG tablet Take 400 mg by mouth 2 (two) times daily.       Marland Kitchen  fluticasone (FLONASE) 50 MCG/ACT nasal spray Place 2 sprays into the nose daily.        Marland Kitchen lidocaine (LIDODERM) 5 % Place 1 patch onto the skin daily. Remove & Discard patch within 12 hours or as directed by MD      . loratadine (CLARITIN) 10 MG tablet Take 10 mg by mouth daily.      . Omega-3 Fatty Acids (FISH OIL PO) Take  300-1,000 capsules by mouth daily. Take 2 daily      . omeprazole (PRILOSEC) 20 MG capsule TAKE ONE CAPSULE BY MOUTH EVERY DAY  30 capsule  11  . Oxycodone HCl 20 MG TABS Take 5 mg by mouth every 3 (three) hours as needed.       Marland Kitchen PROAIR HFA 108 (90 BASE) MCG/ACT inhaler Inhale 2 puffs into the lungs every 6 (six) hours as needed.        No current facility-administered medications for this visit.    Allergies as of 03/12/2014  . (No Known Allergies)    ROS:  General: Negative for anorexia, fever, chills, fatigue, weakness. See history of present illness ENT: Negative for hoarseness, difficulty swallowing , nasal congestion. CV: Negative for chest pain, angina, palpitations, dyspnea on exertion, peripheral edema.  Respiratory: Negative for dyspnea at rest, dyspnea on exertion, cough, sputum, wheezing.  GI: See history of present illness. GU:  Negative for dysuria, hematuria, urinary incontinence, urinary frequency, nocturnal urination.  Endo: Negative for unusual weight change.    Physical Examination:   BP 127/86  Pulse 86  Temp(Src) 98.1 F (36.7 C) (Oral)  Resp 18  Ht _0  (1.651 m)  Wt 151 lb (68.493 kg)  BMI 25.13 kg/m2  General: Well-nourished, well-developed in no acute distress.  Eyes: No icterus. Mouth: Oropharyngeal mucosa moist and pink , no lesions erythema or exudate. Lungs: Clear to auscultation bilaterally.  Heart: Regular rate and rhythm, no murmurs rubs or gallops.  Abdomen: Bowel sounds are normal, nontender, nondistended, no hepatosplenomegaly or masses, no abdominal bruits or hernia , no rebound or guarding.   Extremities: No lower extremity edema. No clubbing or deformities. Neuro: Alert and oriented x 4   Skin: Warm and dry, no jaundice.   Psych: Alert and cooperative, normal mood and affect.  Labs:  03/06/2014 White blood cell count 10,400, hemoglobin 11.2, hematocrit 36.3, MCV 75.9, platelets 210,000, glucose 78, creatinine 0.76, total bilirubin  0.5, alkaline phosphatase 101, AST 16, ALT 11, albumin 4.3   Imaging Studies: CT abdomen and pelvis without contrast at [facility] on 11/18/2013 for biliary drain placement, postsurgical changes related to hepaticojejunostomy with a transhepatic biliary drain in place, with its distal loop appearing to form in the proximal jejunum. Multiple prominent lymph nodes in the porta hepatis may be related to cirrhosis and portal hypertension.

## 2014-03-12 NOTE — Patient Instructions (Signed)
1. Short-term use of Ambien to help with sleep. Due to your liver issues, we can only give you 5mg  tablets. Take only as needed. Not meant for long-term use.  2. Office visit with Dr. Gala Romney in 05/2014.

## 2014-03-13 ENCOUNTER — Encounter: Payer: Self-pay | Admitting: Gastroenterology

## 2014-03-14 NOTE — Assessment & Plan Note (Signed)
Short-term low-dose Ambien

## 2014-03-20 NOTE — Assessment & Plan Note (Addendum)
Currently up to date on labs and imaging. OV in 05/2014 with Dr. Gala Romney.

## 2014-03-20 NOTE — Assessment & Plan Note (Addendum)
S/p PTC removal. Continues to follow with [facility]/Dr. Willa Frater, 03/2014.

## 2014-03-21 ENCOUNTER — Encounter: Payer: Self-pay | Admitting: Gastroenterology

## 2014-03-21 ENCOUNTER — Encounter: Payer: Self-pay | Admitting: Physical Therapy

## 2014-03-21 ENCOUNTER — Telehealth: Payer: Self-pay | Admitting: Internal Medicine

## 2014-03-21 NOTE — Progress Notes (Signed)
cc'd to pcp 

## 2014-03-21 NOTE — Telephone Encounter (Signed)
Letter has been mailed to the patient 

## 2014-03-21 NOTE — Telephone Encounter (Signed)
Pt was seen recently, but she is on the July recall to have a follow up chest CT in one year. Has that been scheduled?

## 2014-03-25 ENCOUNTER — Encounter: Payer: Self-pay | Admitting: Physical Therapy

## 2014-03-28 ENCOUNTER — Encounter: Payer: Self-pay | Admitting: Physical Therapy

## 2014-04-04 ENCOUNTER — Ambulatory Visit: Payer: PRIVATE HEALTH INSURANCE | Admitting: Obstetrics and Gynecology

## 2014-04-05 ENCOUNTER — Ambulatory Visit: Payer: PRIVATE HEALTH INSURANCE | Admitting: Obstetrics and Gynecology

## 2014-04-09 ENCOUNTER — Other Ambulatory Visit: Payer: Self-pay | Admitting: Gastroenterology

## 2014-04-09 ENCOUNTER — Other Ambulatory Visit: Payer: Self-pay

## 2014-04-09 DIAGNOSIS — R918 Other nonspecific abnormal finding of lung field: Secondary | ICD-10-CM

## 2014-04-11 ENCOUNTER — Other Ambulatory Visit (HOSPITAL_COMMUNITY): Payer: Self-pay

## 2014-04-11 ENCOUNTER — Telehealth: Payer: Self-pay

## 2014-04-11 MED ORDER — ZOLPIDEM TARTRATE 5 MG PO TABS: 5.0000 mg | ORAL_TABLET | Freq: Every evening | ORAL | Status: DC | PRN

## 2014-04-11 NOTE — Telephone Encounter (Signed)
T/C from [place] at [facility] they are having difficulty with their fax machine and she called in refill requests for this pt. Requesting: Furosemide 20 mg  And Ambien 5 mg She said OK to send electronically.

## 2014-04-11 NOTE — Telephone Encounter (Signed)
I refilled Ambien X 1. Needs further refills from PCP.  Is she taking aldactone as well? If she is on Lasix 20 mg then she needs to take aldactone 50 mg as well. Can we find out what she is actually taking? It's not on med list.

## 2014-04-11 NOTE — Telephone Encounter (Signed)
How is she doing with lower extremity edema?

## 2014-04-11 NOTE — Telephone Encounter (Signed)
Hold diuretics. Will just refill the Ambien X 1.

## 2014-04-11 NOTE — Telephone Encounter (Signed)
Pt said she is not having any lower extremity edema now.

## 2014-04-11 NOTE — Telephone Encounter (Signed)
LMOM for pt to hold the diurectics and Rx faxed to [facility] for the Ambien.

## 2014-04-11 NOTE — Telephone Encounter (Signed)
Pt said she has only been taking Furosemide 20 mg daily and it will be refillable on 04/12/2014. Neil Crouch, PA gave it to her without refills.

## 2014-04-16 ENCOUNTER — Ambulatory Visit (HOSPITAL_COMMUNITY): Payer: PRIVATE HEALTH INSURANCE

## 2014-04-16 ENCOUNTER — Other Ambulatory Visit: Payer: Self-pay

## 2014-04-17 ENCOUNTER — Encounter: Payer: Self-pay | Admitting: Obstetrics and Gynecology

## 2014-04-17 ENCOUNTER — Ambulatory Visit (INDEPENDENT_AMBULATORY_CARE_PROVIDER_SITE_OTHER): Payer: PRIVATE HEALTH INSURANCE | Admitting: Obstetrics and Gynecology

## 2014-04-17 VITALS — BP 100/66 | Ht 65.0 in | Wt 153.0 lb

## 2014-04-17 DIAGNOSIS — L678 Other hair color and hair shaft abnormalities: Secondary | ICD-10-CM

## 2014-04-17 DIAGNOSIS — L738 Other specified follicular disorders: Secondary | ICD-10-CM

## 2014-04-17 DIAGNOSIS — L739 Follicular disorder, unspecified: Secondary | ICD-10-CM

## 2014-04-17 DIAGNOSIS — B009 Herpesviral infection, unspecified: Secondary | ICD-10-CM

## 2014-04-17 MED ORDER — ACYCLOVIR 400 MG PO TABS: 400.0000 mg | ORAL_TABLET | Freq: Two times a day (BID) | ORAL | Status: DC

## 2014-04-17 NOTE — Telephone Encounter (Signed)
See telephone note 04/11/14.

## 2014-04-17 NOTE — Progress Notes (Signed)
[facility] Visit  Patient name: Sabrina Wyatt MRN [id number]  Date of birth: [birth year]  CC & HPI:  IFEOLUWA Wyatt is a 63 y.o. female presenting today for inspection of vulvar folliculitis on the right gluteal area that she was given abx for. She states that the area has gone down but is still there.  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  No other complaints.  Pertinent History Reviewed:   Reviewed: Significant for  Medical         Past Medical History  Diagnosis Date  . GERD (gastroesophageal reflux disease)   . Cirrhosis, hepatitis C     CT on 03/26/13= no HCC, pt has been vaccinated for Hep A and Hep B. s/p treatment of HCV with eradication  . Depression   . Carpal tunnel syndrome   . S/P endoscopy 08/27/10    antral erosions, otherwise normal, due egd 08/2012 to screen for varices  . Abdominal wall pain     chronic  . Pulmonary nodules   . PTSD (post-traumatic stress disorder)   . Chronic low back pain   . 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 Polysubstance abuse     HX of  . S/P colonoscopy 2005    Dr Moss Mc polyp removed, otherwise normal  . Hypertension   . Tobacco abuse   . Intussusception 04/2012  . Shortness of breath   . Seizures 1998    with brain bleed x1. None since then and on no meds  . Chronic abdominal pain   . Biliary stone                               Surgical Hx:    Past Surgical History  Procedure Laterality Date  . Cholecystectomy  2002  . Percutaneous transhepatic cholangiograhpy and billiary drainage  2002  . Roux-en-y-hepatojejunostomy  2003    revision in 2013 for intussusception [facility] Dr. Bailey Mech)  . Spinal fusion      C5-C7  . Carpal tunnel release Bilateral 12/2010  . Esophagogastroduodenoscopy    09/10/2003    Small hiatal hernia; otherwise normal stomach, normal D1 and D2  . Colonoscopy    09/10/2003    diminutive polyp in the rectum cold biopsied/removed/ Normal colon   . Esophagogastroduodenoscopy  08/27/2010    PPJ:[id number] esophagus.  No varices.  Couple of tiny antral erosions of doubtful clinical significance, otherwise normal stomach, D1 and D2.  . Esophagogastroduodenoscopy (egd) with propofol N/A 08/02/2013    RMR: Portal gastropathy. No explanation for abdominal pain which I believe is more abdominal wall in origin. She has been seen by Dr. Carlis Abbott  over at [facility]. She is up-to-date on cross sectional imaging. She has a pain management physician in [place]. Repeat EGD for varices in 3 years.  . Colonoscopy with propofol N/A 08/02/2013    TIW:[id number] diverticulosis. next TCS 10 years.  . Biliary stone removal  2015   Medications: Reviewed & Updated - see associated section                      Current outpatient prescriptions:albuterol (PROVENTIL) (2.5 MG/3ML) 0.083% nebulizer solution, Take 2.5 mg by nebulization every 6 (six) hours as needed for wheezing or shortness of breath., Disp: , Rfl: ;  atenolol (TENORMIN) 25 MG tablet, Take 25 mg by mouth daily. , Disp: ,  Rfl: ;  Cholecalciferol (VITAMIN D-3) 1000 UNITS CAPS, Take 1,000 capsules by mouth daily., Disp: , Rfl:  DULoxetine (CYMBALTA) 60 MG capsule, Take 60 mg by mouth 2 (two) times daily. , Disp: , Rfl: ;  EPIPEN 2-PAK 0.3 MG/0.3ML SOAJ injection, , Disp: , Rfl: ;  etodolac (LODINE) 400 MG tablet, Take 400 mg by mouth 2 (two) times daily. , Disp: , Rfl: ;  fluticasone (FLONASE) 50 MCG/ACT nasal spray, Place 2 sprays into the nose daily.  , Disp: , Rfl:  lidocaine (LIDODERM) 5 %, Place 1 patch onto the skin daily. Remove & Discard patch within 12 hours or as directed by MD, Disp: , Rfl: ;  loratadine (CLARITIN) 10 MG tablet, Take 10 mg by mouth daily., Disp: , Rfl: ;  Omega-3 Fatty Acids (FISH OIL PO), Take 300-1,000 capsules by mouth daily. Take 2 daily, Disp: , Rfl: ;  omeprazole (PRILOSEC) 20 MG capsule, TAKE ONE CAPSULE BY MOUTH EVERY DAY, Disp: 30 capsule, Rfl: 11 Oxycodone HCl 10 MG TABS, , Disp:  , Rfl: ;  PROAIR HFA 108 (90 BASE) MCG/ACT inhaler, Inhale 2 puffs into the lungs every 6 (six) hours as needed. , Disp: , Rfl: ;  zolpidem (AMBIEN) 5 MG tablet, Take 1 tablet (5 mg total) by mouth at bedtime as needed for sleep., Disp: 30 tablet, Rfl: 0   Social History: Reviewed -  reports that she has been smoking Cigarettes.  She has a 7.5 pack-year smoking history. She has never used smokeless tobacco.  Objective Findings:  Vitals: Blood pressure 100/66, height 5\' 5"  (1.651 m), weight 153 lb (69.4 kg).  Physical Examination: Pelvic -  VULVA: normal appearing vulva with no masses, tenderness or lesions,  VAGINA: normal appearing vagina with normal color and discharge, no lesions,  CERVIX:  UTERUS: ,  ADNEXA:    Assessment & Plan:   A:  1. Vulvar folliculitis resolved.  2. Recurrent HSV-2 by hx.  3. 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 suppress with Acyclovir BID daily.     This chart was scribed by Steva Colder, Medical Scribe, for Dr. Mallory Shirk on 04/17/14 at 12:44 PM. This chart was reviewed by Dr. Mallory Shirk for accuracy.

## 2014-04-22 ENCOUNTER — Other Ambulatory Visit: Payer: Self-pay | Admitting: Internal Medicine

## 2014-04-22 DIAGNOSIS — Z1231 Encounter for screening mammogram for malignant neoplasm of breast: Secondary | ICD-10-CM

## 2014-04-23 ENCOUNTER — Ambulatory Visit (HOSPITAL_COMMUNITY): Payer: PRIVATE HEALTH INSURANCE

## 2014-04-24 ENCOUNTER — Encounter: Payer: Self-pay | Admitting: Internal Medicine

## 2014-04-30 ENCOUNTER — Ambulatory Visit (HOSPITAL_COMMUNITY): Payer: PRIVATE HEALTH INSURANCE

## 2014-05-02 ENCOUNTER — Encounter: Payer: Self-pay | Admitting: Internal Medicine

## 2014-05-09 ENCOUNTER — Telehealth: Payer: Self-pay | Admitting: Internal Medicine

## 2014-05-09 NOTE — Telephone Encounter (Signed)
PATIENT CALLED TO SCHEDULE HER CT THAT IS PRIOR TO HER OV IN OCT.

## 2014-05-09 NOTE — Telephone Encounter (Signed)
There was an order put in  Aug for her CT chest that we can still use.

## 2014-05-09 NOTE — Telephone Encounter (Addendum)
Pt had MRI of the ABD done in Aug 2015 at [facility].

## 2014-05-09 NOTE — Telephone Encounter (Signed)
Pt will call x-ray to get the date and time for her CT scan

## 2014-05-20 ENCOUNTER — Ambulatory Visit (HOSPITAL_COMMUNITY): Payer: PRIVATE HEALTH INSURANCE

## 2014-05-20 ENCOUNTER — Ambulatory Visit (HOSPITAL_COMMUNITY): Payer: Self-pay

## 2014-05-23 ENCOUNTER — Ambulatory Visit: Payer: PRIVATE HEALTH INSURANCE | Attending: Anesthesiology | Admitting: Physical Therapy

## 2014-05-23 DIAGNOSIS — M545 Low back pain: Secondary | ICD-10-CM | POA: Insufficient documentation

## 2014-05-23 DIAGNOSIS — R5381 Other malaise: Secondary | ICD-10-CM | POA: Insufficient documentation

## 2014-05-23 DIAGNOSIS — Z9889 Other specified postprocedural states: Secondary | ICD-10-CM | POA: Diagnosis not present

## 2014-05-24 ENCOUNTER — Ambulatory Visit (HOSPITAL_COMMUNITY)
Admission: RE | Admit: 2014-05-24 | Discharge: 2014-05-24 | Disposition: A | Discharge status: Home / Self Care | Payer: PRIVATE HEALTH INSURANCE | Source: Ambulatory Visit | Attending: Gastroenterology | Admitting: Gastroenterology

## 2014-05-24 ENCOUNTER — Ambulatory Visit (HOSPITAL_COMMUNITY)
Admission: RE | Admit: 2014-05-24 | Discharge: 2014-05-24 | Disposition: A | Discharge status: Home / Self Care | Payer: PRIVATE HEALTH INSURANCE | Source: Ambulatory Visit | Attending: Internal Medicine | Admitting: Internal Medicine

## 2014-05-24 DIAGNOSIS — Z1231 Encounter for screening mammogram for malignant neoplasm of breast: Secondary | ICD-10-CM | POA: Insufficient documentation

## 2014-05-24 DIAGNOSIS — R918 Other nonspecific abnormal finding of lung field: Secondary | ICD-10-CM

## 2014-05-27 ENCOUNTER — Encounter: Payer: Self-pay | Admitting: Physical Therapy

## 2014-05-28 ENCOUNTER — Ambulatory Visit (INDEPENDENT_AMBULATORY_CARE_PROVIDER_SITE_OTHER): Payer: PRIVATE HEALTH INSURANCE | Admitting: Internal Medicine

## 2014-05-28 ENCOUNTER — Encounter: Payer: Self-pay | Admitting: Internal Medicine

## 2014-05-28 VITALS — BP 100/60 | HR 79 | Temp 97.0°F | Ht 65.0 in | Wt 160.8 lb

## 2014-05-28 DIAGNOSIS — R1013 Epigastric pain: Secondary | ICD-10-CM

## 2014-05-28 DIAGNOSIS — K219 Gastro-esophageal reflux disease without esophagitis: Secondary | ICD-10-CM

## 2014-05-28 DIAGNOSIS — K831 Obstruction of bile duct: Secondary | ICD-10-CM

## 2014-05-28 DIAGNOSIS — G8929 Other chronic pain: Secondary | ICD-10-CM

## 2014-05-28 DIAGNOSIS — K7469 Other cirrhosis of liver: Secondary | ICD-10-CM

## 2014-05-29 ENCOUNTER — Encounter: Payer: Self-pay | Admitting: Physical Therapy

## 2014-05-31 ENCOUNTER — Encounter: Payer: Self-pay | Admitting: *Deleted

## 2014-06-03 ENCOUNTER — Encounter: Payer: Self-pay | Admitting: Internal Medicine

## 2014-06-03 ENCOUNTER — Ambulatory Visit (INDEPENDENT_AMBULATORY_CARE_PROVIDER_SITE_OTHER): Payer: PRIVATE HEALTH INSURANCE | Admitting: Obstetrics and Gynecology

## 2014-06-03 ENCOUNTER — Encounter: Payer: Self-pay | Admitting: Obstetrics and Gynecology

## 2014-06-03 VITALS — BP 110/60 | Ht 65.0 in | Wt 157.0 lb

## 2014-06-03 DIAGNOSIS — L739 Follicular disorder, unspecified: Secondary | ICD-10-CM

## 2014-06-03 NOTE — Progress Notes (Signed)
Patient ID: Sabrina Wyatt, female   DOB: October 03, 1950, 63 y.o.   MRN: [id number] Pt here today for follow up. Pt denies any problems or concerns at this time.

## 2014-06-03 NOTE — Progress Notes (Addendum)
Patient ID: Sabrina Wyatt, female   DOB: 1951/08/10, 63 y.o.   MRN: [id number]   [facility] Visit  Patient name: Sabrina Wyatt MRN [id number]  Date of birth: 1950-11-17  CC & HPI:  Sabrina Wyatt is a 63 y.o. female presenting today for a follow-up of her vulvar folliculitis on her right gluteal region.  She states that she has noticed some pimples on her gluteal region.  She had a mammogram performed at [facility] recently. Results normal   ROS:  All systems have been reviewed and are negative unless otherwise specified in the HPI.   Pertinent History Reviewed:   Reviewed: Significant for  Medical         Past Medical History  Diagnosis Date  . GERD (gastroesophageal reflux disease)   . Cirrhosis, hepatitis C     CT on 03/26/13= no HCC, pt has been vaccinated for Hep A and Hep B. s/p treatment of HCV with eradication  . Depression   . Carpal tunnel syndrome   . S/P endoscopy 08/27/10    antral erosions, otherwise normal, due egd 08/2012 to screen for varices  . Abdominal wall pain     chronic  . Pulmonary nodules   . PTSD (post-traumatic stress disorder)   . Chronic low back pain   . 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 Polysubstance abuse     HX of  . S/P colonoscopy 2005    Dr Moss Mc polyp removed, otherwise normal  . Hypertension   . Tobacco abuse   . Intussusception 04/2012  . Shortness of breath   . Seizures 1998    with brain bleed x1. None since then and on no meds  . Chronic abdominal pain   . Biliary stone                               Surgical Hx:    Past Surgical History  Procedure Laterality Date  . Cholecystectomy  2002  . Percutaneous transhepatic cholangiograhpy and billiary drainage  2002  . Roux-en-y-hepatojejunostomy  2003    revision in 2013 for intussusception [facility] Dr. Bailey Mech)  . Spinal fusion      C5-C7  . Carpal tunnel release Bilateral 12/2010  . Esophagogastroduodenoscopy    09/10/2003    Small hiatal  hernia; otherwise normal stomach, normal D1 and D2  . Colonoscopy    09/10/2003    diminutive polyp in the rectum cold biopsied/removed/ Normal colon  . Esophagogastroduodenoscopy  08/27/2010    IHK:[id number] esophagus.  No varices.  Couple of tiny antral erosions of doubtful clinical significance, otherwise normal stomach, D1 and D2.  . Esophagogastroduodenoscopy (egd) with propofol N/A 08/02/2013    RMR: Portal gastropathy. No explanation for abdominal pain which I believe is more abdominal wall in origin. She has been seen by Dr. Carlis Abbott  over at [facility]. She is up-to-date on cross sectional imaging. She has a pain management physician in [place]. Repeat EGD for varices in 3 years.  . Colonoscopy with propofol N/A 08/02/2013    GLO:[id number] diverticulosis. next TCS 10 years.  . Biliary stone removal  2015   Medications: Reviewed & Updated - see associated section                      Current outpatient prescriptions:acyclovir (ZOVIRAX) 400 MG tablet, Take 1 tablet (400 mg total) by mouth 2 (two)  times daily. Take twice daily for supression during last weeks of pregnancy, Disp: 60 tablet, Rfl: 11;  albuterol (PROVENTIL) (2.5 MG/3ML) 0.083% nebulizer solution, Take 2.5 mg by nebulization every 6 (six) hours as needed for wheezing or shortness of breath., Disp: , Rfl:  atenolol (TENORMIN) 25 MG tablet, Take 25 mg by mouth daily. , Disp: , Rfl: ;  Cholecalciferol (VITAMIN D-3) 1000 UNITS CAPS, Take 1,000 capsules by mouth daily., Disp: , Rfl: ;  DULoxetine (CYMBALTA) 60 MG capsule, Take 60 mg by mouth 2 (two) times daily. , Disp: , Rfl: ;  etodolac (LODINE) 400 MG tablet, Take 400 mg by mouth 2 (two) times daily. , Disp: , Rfl:  fluticasone (FLONASE) 50 MCG/ACT nasal spray, Place 2 sprays into the nose daily.  , Disp: , Rfl: ;  lidocaine (LIDODERM) 5 %, Place 1 patch onto the skin daily. Remove & Discard patch within 12 hours or as directed by MD, Disp: , Rfl: ;  loratadine (CLARITIN) 10 MG tablet, Take 10  mg by mouth daily., Disp: , Rfl: ;  Omega-3 Fatty Acids (FISH OIL PO), Take 300-1,000 capsules by mouth daily. Take 2 daily, Disp: , Rfl:  omeprazole (PRILOSEC) 20 MG capsule, TAKE ONE CAPSULE BY MOUTH EVERY DAY, Disp: 30 capsule, Rfl: 11;  Oxycodone HCl 10 MG TABS, 10 mg every 6 hours, Disp: , Rfl: ;  PROAIR HFA 108 (90 BASE) MCG/ACT inhaler, Inhale 2 puffs into the lungs every 6 (six) hours as needed. , Disp: , Rfl: ;  zolpidem (AMBIEN) 5 MG tablet, Take 1 tablet (5 mg total) by mouth at bedtime as needed for sleep., Disp: 30 tablet, Rfl: 0 EPIPEN 2-PAK 0.3 MG/0.3ML SOAJ injection, , Disp: , Rfl:    Social History: Reviewed -  reports that she has been smoking Cigarettes.  She has a 7.5 pack-year smoking history. She has never used smokeless tobacco.  Objective Findings:  Vitals: Blood pressure 110/60, height 5\' 5"  (1.651 m), weight 157 lb (71.215 kg).  Physical Examination: General appearance - alert, well appearing, and in no distress, oriented to person, place, and time and normal appearing weight Pelvic - VULVA: normal appearing vulva with no masses, tenderness or lesions,  Gluteal folliculitis resolving. Vagina atrophic at introitus   Assessment & Plan:   A:  1. Vulvar folliculitis, resolved  P:  1. Lotion to resolve dry skin     This chart was scribed for Jonnie Kind, MD by Donato Schultz, ED Scribe. This patient was seen in Room 1 and the patient's care was started at 11:56 AM.

## 2014-06-04 ENCOUNTER — Telehealth: Payer: Self-pay

## 2014-06-04 ENCOUNTER — Ambulatory Visit: Payer: PRIVATE HEALTH INSURANCE | Admitting: *Deleted

## 2014-06-04 DIAGNOSIS — M545 Low back pain: Secondary | ICD-10-CM | POA: Diagnosis not present

## 2014-06-04 NOTE — Telephone Encounter (Signed)
Pt is calling to get her CT scan set up/ Is it ok to set her up for this. Looks like she was on the recall list for Oct. She just had a chest CT. Please advise

## 2014-06-06 ENCOUNTER — Encounter: Payer: Self-pay | Admitting: *Deleted

## 2014-06-11 ENCOUNTER — Telehealth: Payer: Self-pay | Admitting: Internal Medicine

## 2014-06-11 NOTE — Patient Instructions (Signed)
Hepatic profile now  Hepatic ultrasound in 6 months  Continue Prilosec 20 mg daily  Office visit with [place] in 6 months after ultrasound complete

## 2014-06-11 NOTE — Telephone Encounter (Signed)
Patient called with questions about her stomach imaging with contrast and wanting to have an imaging on her left kidney as well.  Please advise and call her back at [phone]

## 2014-06-11 NOTE — Telephone Encounter (Signed)
PER:RMR She is up-to-date with abdominal imaging at [facility]. She needs a repeat ultrasound of her liver in 6 months. Chest CT recently done is okay. No followup chest CT is needed. Pt is aware of what RMR said

## 2014-06-11 NOTE — Progress Notes (Signed)
Primary Care Physician:  Rosita Fire, MD Primary Gastroenterologist:  Dr. Gala Romney  Pre-Procedure History & Physical: HPI:  Sabrina Wyatt is a 63 y.o. female here for followup of cirrhosis secondary to hepatitis C. History of bile duct injury at time of cholecystectomy. Most recently, status post percutaneous intervention of the left hepatic duct stricture with upstream obstructing stone requiring lithotripsy. Stricture has been dilated. Her LFTs have normalized. She is feeling better although as she has a baseline chronic abdominal pain followed by Dr. Laureen Ochs. GERD symptoms well controlled on Prilosec 20 mg daily. History of pulmonary nodules for which repeat CT the chest recommended.  Past Medical History  Diagnosis Date  . GERD (gastroesophageal reflux disease)   . Cirrhosis, hepatitis C     CT on 03/26/13= no HCC, pt has been vaccinated for Hep A and Hep B. s/p treatment of HCV with eradication  . Depression   . Carpal tunnel syndrome   . S/P endoscopy 08/27/10    antral erosions, otherwise normal, due egd 08/2012 to screen for varices  . Abdominal wall pain     chronic  . Pulmonary nodules   . PTSD (post-traumatic stress disorder)   . Chronic low back pain   . 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 Polysubstance abuse     HX of  . S/P colonoscopy 2005    Dr Moss Mc polyp removed, otherwise normal  . Hypertension   . Tobacco abuse   . Intussusception 04/2012  . Shortness of breath   . Seizures 1998    with brain bleed x1. None since then and on no meds  . Chronic abdominal pain   . Biliary stone     Past Surgical History  Procedure Laterality Date  . Cholecystectomy  2002  . Percutaneous transhepatic cholangiograhpy and billiary drainage  2002  . Roux-en-y-hepatojejunostomy  2003    revision in 2013 for intussusception [facility] Dr. Bailey Mech)  . Spinal fusion      C5-C7  . Carpal tunnel release Bilateral 12/2010  . Esophagogastroduodenoscopy     09/10/2003    Small hiatal hernia; otherwise normal stomach, normal D1 and D2  . Colonoscopy    09/10/2003    diminutive polyp in the rectum cold biopsied/removed/ Normal colon  . Esophagogastroduodenoscopy  08/27/2010    NWG:[id number] esophagus.  No varices.  Couple of tiny antral erosions of doubtful clinical significance, otherwise normal stomach, D1 and D2.  . Esophagogastroduodenoscopy (egd) with propofol N/A 08/02/2013    RMR: Portal gastropathy. No explanation for abdominal pain which I believe is more abdominal wall in origin. She has been seen by Dr. Carlis Abbott  over at [facility]. She is up-to-date on cross sectional imaging. She has a pain management physician in [place]. Repeat EGD for varices in 3 years.  . Colonoscopy with propofol N/A 08/02/2013    YQM:VHQIONG diverticulosis. next TCS 10 years.  . Biliary stone removal  2015    Prior to Admission medications   Medication Sig Start Date End Date Taking? Authorizing Provider  acyclovir (ZOVIRAX) 400 MG tablet Take 1 tablet (400 mg total) by mouth 2 (two) times daily. Take twice daily for supression during last weeks of pregnancy 04/17/14  Yes Jonnie Kind, MD  albuterol (PROVENTIL) (2.5 MG/3ML) 0.083% nebulizer solution Take 2.5 mg by nebulization every 6 (six) hours as needed for wheezing or shortness of breath.   Yes Historical Provider, MD  atenolol (TENORMIN) 25 MG tablet Take 25 mg by mouth  daily.    Yes Historical Provider, MD  Cholecalciferol (VITAMIN D-3) 1000 UNITS CAPS Take 1,000 capsules by mouth daily.   Yes Historical Provider, MD  DULoxetine (CYMBALTA) 60 MG capsule Take 60 mg by mouth 2 (two) times daily.    Yes Historical Provider, MD  etodolac (LODINE) 400 MG tablet Take 400 mg by mouth 2 (two) times daily.  04/13/12  Yes Historical Provider, MD  fluticasone (FLONASE) 50 MCG/ACT nasal spray Place 2 sprays into the nose daily.     Yes Historical Provider, MD  loratadine (CLARITIN) 10 MG tablet Take 10 mg by mouth daily.    Yes Historical Provider, MD  Omega-3 Fatty Acids (FISH OIL PO) Take 300-1,000 capsules by mouth daily. Take 2 daily   Yes Historical Provider, MD  omeprazole (PRILOSEC) 20 MG capsule TAKE ONE CAPSULE BY MOUTH EVERY DAY 09/10/12  Yes Andria Meuse, NP  Oxycodone HCl 10 MG TABS 10 mg every 6 hours 03/22/14  Yes Historical Provider, MD  PROAIR HFA 108 (90 BASE) MCG/ACT inhaler Inhale 2 puffs into the lungs every 6 (six) hours as needed.  02/21/12  Yes Historical Provider, MD  zolpidem (AMBIEN) 5 MG tablet Take 1 tablet (5 mg total) by mouth at bedtime as needed for sleep. 04/11/14  Yes Orvil Feil, NP  EPIPEN 2-PAK 0.3 MG/0.3ML SOAJ injection  03/04/13   Historical Provider, MD  lidocaine (LIDODERM) 5 % Place 1 patch onto the skin daily. Remove & Discard patch within 12 hours or as directed by MD    Historical Provider, MD    Allergies as of 05/28/2014  . (No Known Allergies)    Family History  Problem Relation Age of Onset  . Coronary artery disease Brother   . Cancer Sister     lymphoma  . Cancer Father     brain    History   Social History  . Marital Status: Single    Spouse Name: N/A    Number of Children: 1  . Years of Education: N/A   Occupational History  . disabled    Social History Main Topics  . Smoking status: Current Some Day Smoker -- 0.25 packs/day for 30 years    Types: Cigarettes  . Smokeless tobacco: Never Used     Comment: One pack every 6-7 days  . Alcohol Use: Yes     Comment: social   . Drug Use: No     Comment: remote in teens-marijuana, heroin, cocaine  . Sexual Activity: No   Other Topics Concern  . Not on file   Social History Narrative  . No narrative on file    Review of Systems: See HPI, otherwise negative ROS  Physical Exam: BP 100/60  Pulse 79  Temp(Src) 97 F (36.1 C) (Oral)  Ht 5\' 5"  (1.651 m)  Wt 160 lb 12.8 oz (72.938 kg)  BMI 26.76 kg/m2 General:   Alert,   pleasant and cooperative in NAD Skin:  Intact without significant  lesions or rashes. Eyes:  Sclera clear, no icterus.   Conjunctiva pink. Ears:  Normal auditory acuity. Nose:  No deformity, discharge,  or lesions. Mouth:  No deformity or lesions. Neck:  Supple; no masses or thyromegaly. No significant cervical adenopathy. Lungs:  Clear throughout to auscultation.   No wheezes, crackles, or rhonchi. No acute distress. Heart:  Regular rate and rhythm; no murmurs, clicks, rubs,  or gallops. Abdomen: Nondistended. Multiple surgical scars. Abdomen is soft and nontender without obvious mass or organomegaly.  Pulses:  Normal pulses noted. Extremities:  Without clubbing or edema.  Impression:  63 year old lady with history of hep C-eradicated with antiviral therapy, history of surgical complications of cholecystectomy including bile duct injury;  recently found to have progressive obstruction of the left hepatic system;  Obstructing large gallstone upstream.-  status post interventional radiology dilation of biliary stricture and stone lithotripsy. LFTs known to have normalized.  Chronic abdominal pain management by Dr. Lovenia Shuck. Clinically doing well now.   GERD symptoms well controlled. History of pulmonary nodules with recent CT of chest demonstrating stability over 5 years;  it was recommended by the radiologist no further imaging in the future needed.  Recommendations:  Continue Prilosec 20 mg daily. Follow LFTs. Continued cirrhosis care.  Office visit with [place] in 6 months.  Ultrasound in 6 months.   Notice: This dictation was prepared with Dragon dictation along with smaller phrase technology. Any transcriptional errors that result from this process are unintentional and may not be corrected upon review.

## 2014-06-11 NOTE — Telephone Encounter (Signed)
Pt is aware.  

## 2014-06-11 NOTE — Telephone Encounter (Signed)
Reminder in epic to have Liver U/S in 6 months

## 2014-06-11 NOTE — Telephone Encounter (Signed)
She is up-to-date with abdominal imaging at [facility]. She needs a repeat ultrasound of her liver in 6 months. Chest CT recently done is okay. No followup chest CT is needed.

## 2014-06-12 ENCOUNTER — Ambulatory Visit: Payer: PRIVATE HEALTH INSURANCE | Admitting: Physical Therapy

## 2014-06-12 DIAGNOSIS — M545 Low back pain: Secondary | ICD-10-CM | POA: Diagnosis not present

## 2014-06-14 ENCOUNTER — Encounter: Payer: Self-pay | Admitting: *Deleted

## 2014-06-26 ENCOUNTER — Telehealth: Payer: Self-pay | Admitting: Internal Medicine

## 2014-06-26 NOTE — Telephone Encounter (Signed)
Patient called saying that she had received a letter from [place] and is aware of her OV with RMR in [place].  She kept talking about having a MRI/US with contrast done either before or after her OV and having it done elsewhere. She was very sarcastic with me over the phone. I told her that GF would call her back tomorrow when she gets in to answer her questions. [phone]

## 2014-07-04 NOTE — Telephone Encounter (Signed)
Tried to call with no answer  

## 2014-07-04 NOTE — Telephone Encounter (Signed)
Pt is aware and has her appointment

## 2014-07-05 ENCOUNTER — Other Ambulatory Visit: Payer: Self-pay | Admitting: Obstetrics and Gynecology

## 2014-07-05 ENCOUNTER — Telehealth: Payer: Self-pay | Admitting: Obstetrics and Gynecology

## 2014-07-05 DIAGNOSIS — B373 Candidiasis of vulva and vagina: Secondary | ICD-10-CM

## 2014-07-05 DIAGNOSIS — B3731 Acute candidiasis of vulva and vagina: Secondary | ICD-10-CM

## 2014-07-05 MED ORDER — FLUCONAZOLE 150 MG PO TABS: 150.0000 mg | ORAL_TABLET | Freq: Once | ORAL | Status: DC

## 2014-07-24 ENCOUNTER — Other Ambulatory Visit: Payer: Self-pay

## 2014-07-31 LAB — HEPATIC FUNCTION PANEL
ALT: 14 U/L (ref 0–35)
AST: 17 U/L (ref 0–37)
Albumin: 4 g/dL (ref 3.5–5.2)
Alkaline Phosphatase: 103 U/L (ref 39–117)
Bilirubin, Direct: 0.1 mg/dL (ref 0.0–0.3)
Indirect Bilirubin: 0.3 mg/dL (ref 0.2–1.2)
Total Bilirubin: 0.4 mg/dL (ref 0.2–1.2)
Total Protein: 6.8 g/dL (ref 6.0–8.3)

## 2014-08-02 ENCOUNTER — Telehealth: Payer: Self-pay | Admitting: Internal Medicine

## 2014-08-02 NOTE — Telephone Encounter (Signed)
For clarification, repeat LFTs in one year.

## 2014-08-05 ENCOUNTER — Other Ambulatory Visit: Payer: Self-pay | Admitting: Internal Medicine

## 2014-08-05 ENCOUNTER — Encounter: Payer: Self-pay | Admitting: Physical Therapy

## 2014-08-05 DIAGNOSIS — K7469 Other cirrhosis of liver: Secondary | ICD-10-CM

## 2014-08-05 NOTE — Telephone Encounter (Signed)
Noted. Letter mailed to pt. Lab order on file.

## 2014-08-07 ENCOUNTER — Encounter: Payer: Self-pay | Admitting: Physical Therapy

## 2014-08-08 ENCOUNTER — Ambulatory Visit: Payer: PRIVATE HEALTH INSURANCE | Attending: Anesthesiology | Admitting: Physical Therapy

## 2014-08-08 ENCOUNTER — Telehealth: Payer: Self-pay

## 2014-08-08 DIAGNOSIS — Z9889 Other specified postprocedural states: Secondary | ICD-10-CM | POA: Insufficient documentation

## 2014-08-08 DIAGNOSIS — R5381 Other malaise: Secondary | ICD-10-CM | POA: Insufficient documentation

## 2014-08-08 DIAGNOSIS — M545 Low back pain: Secondary | ICD-10-CM | POA: Insufficient documentation

## 2014-08-08 NOTE — Telephone Encounter (Signed)
I called patient this morning and told her that we have her down for OV with RMR tomorrow, but we had last seen her in Oct and was down to follow up in 6 months. I asked if she felt that she needed to come in tomorrow or wait until April for her 6 month follow up. She said not to cancel OV for tomorrow that she wanted to come in to be seen.

## 2014-08-08 NOTE — Telephone Encounter (Signed)
Pt is on RMR schedule for tomorrow. She was last seen in October and was to follow up in 6 months. Looks like we sent a letter to her to schedule. Please check with the pt and make sure she is having problems and needs to be seen tomorrow. If she is doing ok, she can wait until April. Thanks.

## 2014-08-09 ENCOUNTER — Other Ambulatory Visit: Payer: Self-pay

## 2014-08-09 ENCOUNTER — Encounter: Payer: Self-pay | Admitting: Internal Medicine

## 2014-08-09 ENCOUNTER — Ambulatory Visit (INDEPENDENT_AMBULATORY_CARE_PROVIDER_SITE_OTHER): Payer: PRIVATE HEALTH INSURANCE | Admitting: Internal Medicine

## 2014-08-09 VITALS — BP 141/90 | HR 91 | Temp 97.4°F | Ht 65.0 in | Wt 158.0 lb

## 2014-08-09 DIAGNOSIS — K7469 Other cirrhosis of liver: Secondary | ICD-10-CM

## 2014-08-09 DIAGNOSIS — K219 Gastro-esophageal reflux disease without esophagitis: Secondary | ICD-10-CM

## 2014-08-09 DIAGNOSIS — K8035 Calculus of bile duct with chronic cholangitis with obstruction: Secondary | ICD-10-CM

## 2014-08-09 NOTE — Progress Notes (Signed)
Primary Care Physician:  Rosita Fire, MD Primary Gastroenterologist:  Dr. Gala Romney  Pre-Procedure History & Physical: HPI:  Sabrina Wyatt is a 63 y.o. female here for follow-up. History of recent interim into right intussusception and most recently a left common hepatic duct stone with stricture requiring stricture dilation and lithotripsy at [facility]. She has done well although she has some current intermittent epigastric/generalized abdominal pain LFTs recently normal. She is actively engaged with her pain management physician. Recent LFTs normal. Patient very much wants to have another MRI to make sure everything is okay.  Past Medical History  Diagnosis Date  . GERD (gastroesophageal reflux disease)   . Cirrhosis, hepatitis C     CT on 03/26/13= no HCC, pt has been vaccinated for Hep A and Hep B. s/p treatment of HCV with eradication  . Depression   . Carpal tunnel syndrome   . S/P endoscopy 08/27/10    antral erosions, otherwise normal, due egd 08/2012 to screen for varices  . Abdominal wall pain     chronic  . Pulmonary nodules   . PTSD (post-traumatic stress disorder)   . Chronic low back pain   . 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 Polysubstance abuse     HX of  . S/P colonoscopy 2005    Dr Moss Mc polyp removed, otherwise normal  . Hypertension   . Tobacco abuse   . Intussusception 04/2012  . Shortness of breath   . Seizures 1998    with brain bleed x1. None since then and on no meds  . Chronic abdominal pain   . Biliary stone     Past Surgical History  Procedure Laterality Date  . Cholecystectomy  2002  . Percutaneous transhepatic cholangiograhpy and billiary drainage  2002  . Roux-en-y-hepatojejunostomy  2003    revision in 2013 for intussusception [facility] Dr. Bailey Mech)  . Spinal fusion      C5-C7  . Carpal tunnel release Bilateral 12/2010  . Esophagogastroduodenoscopy    09/10/2003    Small hiatal hernia; otherwise normal stomach, normal D1  and D2  . Colonoscopy    09/10/2003    diminutive polyp in the rectum cold biopsied/removed/ Normal colon  . Esophagogastroduodenoscopy  08/27/2010    HBZ:JIRCVE esophagus.  No varices.  Couple of tiny antral erosions of doubtful clinical significance, otherwise normal stomach, D1 and D2.  . Esophagogastroduodenoscopy (egd) with propofol N/A 08/02/2013    RMR: Portal gastropathy. No explanation for abdominal pain which I believe is more abdominal wall in origin. She has been seen by Dr. Carlis Abbott  over at [facility]. She is up-to-date on cross sectional imaging. She has a pain management physician in [place]. Repeat EGD for varices in 3 years.  . Colonoscopy with propofol N/A 08/02/2013    LFY:BOFBPZW diverticulosis. next TCS 10 years.  . Biliary stone removal  2015    Prior to Admission medications   Medication Sig Start Date End Date Taking? Authorizing Provider  acyclovir (ZOVIRAX) 400 MG tablet Take 1 tablet (400 mg total) by mouth 2 (two) times daily. Take twice daily for supression during last weeks of pregnancy 04/17/14  Yes Jonnie Kind, MD  albuterol (PROVENTIL) (2.5 MG/3ML) 0.083% nebulizer solution Take 2.5 mg by nebulization every 6 (six) hours as needed for wheezing or shortness of breath.   Yes Historical Provider, MD  atenolol (TENORMIN) 25 MG tablet Take 25 mg by mouth daily.    Yes Historical Provider, MD  Cholecalciferol (VITAMIN  D-3) 1000 UNITS CAPS Take 1,000 capsules by mouth daily.   Yes Historical Provider, MD  DULoxetine (CYMBALTA) 60 MG capsule Take 60 mg by mouth 2 (two) times daily.    Yes Historical Provider, MD  EPIPEN 2-PAK 0.3 MG/0.3ML SOAJ injection  03/04/13  Yes Historical Provider, MD  etodolac (LODINE) 400 MG tablet Take 400 mg by mouth 2 (two) times daily.  04/13/12  Yes Historical Provider, MD  fluconazole (DIFLUCAN) 150 MG tablet Take 1 tablet (150 mg total) by mouth once. 07/05/14  Yes Jonnie Kind, MD  fluticasone (FLONASE) 50 MCG/ACT nasal spray Place 2  sprays into the nose daily.     Yes Historical Provider, MD  lidocaine (LIDODERM) 5 % Place 1 patch onto the skin daily. Remove & Discard patch within 12 hours or as directed by MD   Yes Historical Provider, MD  loratadine (CLARITIN) 10 MG tablet Take 10 mg by mouth daily.   Yes Historical Provider, MD  Omega-3 Fatty Acids (FISH OIL PO) Take 300-1,000 capsules by mouth daily. Take 2 daily   Yes Historical Provider, MD  omeprazole (PRILOSEC) 20 MG capsule TAKE ONE CAPSULE BY MOUTH EVERY DAY 09/10/12  Yes Andria Meuse, NP  Oxycodone HCl 10 MG TABS 10 mg every 6 hours 03/22/14  Yes Historical Provider, MD  PROAIR HFA 108 (90 BASE) MCG/ACT inhaler Inhale 2 puffs into the lungs every 6 (six) hours as needed.  02/21/12  Yes Historical Provider, MD  zolpidem (AMBIEN) 5 MG tablet Take 1 tablet (5 mg total) by mouth at bedtime as needed for sleep. 04/11/14  Yes Orvil Feil, NP    Allergies as of 08/09/2014  . (No Known Allergies)    Family History  Problem Relation Age of Onset  . Coronary artery disease Brother   . Cancer Sister     lymphoma  . Cancer Father     brain    History   Social History  . Marital Status: Single    Spouse Name: N/A    Number of Children: 1  . Years of Education: N/A   Occupational History  . disabled    Social History Main Topics  . Smoking status: Current Some Day Smoker -- 0.25 packs/day for 30 years    Types: Cigarettes  . Smokeless tobacco: Never Used     Comment: One pack every 6-7 days  . Alcohol Use: Yes     Comment: social   . Drug Use: No     Comment: remote in teens-marijuana, heroin, cocaine  . Sexual Activity: No   Other Topics Concern  . Not on file   Social History Narrative    Review of Systems: See HPI, otherwise negative ROS  Physical Exam: BP 141/90 mmHg  Pulse 91  Temp(Src) 97.4 F (36.3 C) (Oral)  Ht 5\' 5"  (1.651 m)  Wt 158 lb (71.668 kg)  BMI 26.29 kg/m2 General:   Alert,  somewhat disheveled, chronically ill-appearing  lady pleasant and cooperative in NAD Skin:  Intact without significant lesions or rashes. Eyes:  Sclera clear, no icterus.   Conjunctiva pink. Ears:  Normal auditory acuity. Nose:  No deformity, discharge,  or lesions. Mouth:  No deformity or lesions. Neck:  Supple; no masses or thyromegaly. No significant cervical adenopathy. Lungs:  Clear throughout to auscultation.   No wheezes, crackles, or rhonchi. No acute distress. Heart:  Regular rate and rhythm; no murmurs, clicks, rubs,  or gallops. Abdomen: Nondistended Multiple surgical scars present. Positive bowel  sounds. Very minimal diffuse to palpation. No appreciable mass or organomegaly. Pulses:  Normal pulses noted. Extremities:  Without clubbing or edema.  Impression/Recommendations:  Recurrent epigastric pain somewhat reminiscent of the type of pain which led to a diagnosis of left hepatic duct stricture and choledocholithiasis requiring balloon stricture dilation and lithotripsy at [facility] earlier this year. History of intussusception prior to this episode requiring surgery. She does not look acutely ill or toxic at this time. GERD symptoms well controlled. No evidence of esophageal varices on prior EGD; she will be due for a screening examination in one year.  Proceed with MRI/MRCP to evaluate biliary stricture and evaluate for potential underlying persistent choledocholithiasis.   Continue Prilose daily   See Dr. Arnoldo Morale regarding a binder  MRCP - recurrent biliary pain - history of biliary stricture and lithotripsy for common duct stone  Follow-up with Dr. Maryjean Ka  Plan for an EGD to check your esophagus in 1 year.  Office visit with [place] with LFT's in 6 months       Notice: This dictation was prepared with Dragon dictation along with smaller phrase technology. Any transcriptional errors that result from this process are unintentional and may not be corrected upon review.

## 2014-08-09 NOTE — Patient Instructions (Addendum)
Continue Prilose daily   See Dr. Arnoldo Morale regarding a binder  MRCP - recurrent biliary pain - history of biliary stricture and lithotripsy for common duct stone  Follow-up with Dr. Maryjean Ka  Plan for an EGD to check your esophagus in 1 year.  Office visit with [place] with LFT's in 6 months

## 2014-08-12 ENCOUNTER — Other Ambulatory Visit: Payer: Self-pay

## 2014-08-12 ENCOUNTER — Telehealth: Payer: Self-pay

## 2014-08-12 DIAGNOSIS — K8035 Calculus of bile duct with chronic cholangitis with obstruction: Secondary | ICD-10-CM

## 2014-08-12 DIAGNOSIS — F22 Delusional disorders: Secondary | ICD-10-CM

## 2014-08-12 NOTE — Telephone Encounter (Signed)
Called pt and notified her of her appt for her MRI/MRCP. 08/21/2014 @ 745am. PA# is [id number]

## 2014-08-13 ENCOUNTER — Encounter: Payer: Self-pay | Admitting: Physical Therapy

## 2014-08-19 ENCOUNTER — Ambulatory Visit: Payer: PRIVATE HEALTH INSURANCE | Admitting: Physical Therapy

## 2014-08-19 ENCOUNTER — Encounter: Payer: Self-pay | Admitting: Internal Medicine

## 2014-08-19 DIAGNOSIS — M545 Low back pain: Secondary | ICD-10-CM | POA: Diagnosis not present

## 2014-08-21 ENCOUNTER — Ambulatory Visit (HOSPITAL_COMMUNITY)
Admission: RE | Admit: 2014-08-21 | Discharge: 2014-08-21 | Disposition: A | Discharge status: Home / Self Care | Payer: PRIVATE HEALTH INSURANCE | Source: Ambulatory Visit | Attending: Internal Medicine | Admitting: Internal Medicine

## 2014-08-21 ENCOUNTER — Other Ambulatory Visit: Payer: Self-pay | Admitting: Internal Medicine

## 2014-08-21 ENCOUNTER — Encounter (HOSPITAL_COMMUNITY): Payer: Self-pay

## 2014-08-21 DIAGNOSIS — K7460 Unspecified cirrhosis of liver: Secondary | ICD-10-CM

## 2014-08-21 DIAGNOSIS — R109 Unspecified abdominal pain: Secondary | ICD-10-CM | POA: Insufficient documentation

## 2014-08-21 DIAGNOSIS — K7290 Hepatic failure, unspecified without coma: Secondary | ICD-10-CM | POA: Diagnosis not present

## 2014-08-21 DIAGNOSIS — K8035 Calculus of bile duct with chronic cholangitis with obstruction: Secondary | ICD-10-CM

## 2014-08-21 DIAGNOSIS — K7689 Other specified diseases of liver: Secondary | ICD-10-CM | POA: Diagnosis not present

## 2014-08-21 DIAGNOSIS — Z980 Intestinal bypass and anastomosis status: Secondary | ICD-10-CM | POA: Insufficient documentation

## 2014-08-21 DIAGNOSIS — R160 Hepatomegaly, not elsewhere classified: Secondary | ICD-10-CM | POA: Diagnosis not present

## 2014-08-21 DIAGNOSIS — R911 Solitary pulmonary nodule: Secondary | ICD-10-CM | POA: Insufficient documentation

## 2014-08-21 LAB — POCT I-STAT CREATININE: Creatinine, Ser: 0.8 mg/dL (ref 0.50–1.10)

## 2014-08-21 MED ORDER — GADOBENATE DIMEGLUMINE 529 MG/ML IV SOLN
14.0000 mL | Freq: Once | INTRAVENOUS | Status: AC | PRN
  Administered 2014-08-21: 14 mL via INTRAVENOUS

## 2014-08-21 MED ORDER — SODIUM CHLORIDE 0.9 % IV SOLN
INTRAVENOUS | Status: AC
  Filled 2014-08-21: qty 250

## 2014-08-21 MED ORDER — SODIUM CHLORIDE 0.9 % IJ SOLN
INTRAMUSCULAR | Status: AC
  Filled 2014-08-21: qty 15

## 2014-08-26 ENCOUNTER — Encounter: Payer: Self-pay | Admitting: Physical Therapy

## 2014-08-28 ENCOUNTER — Encounter: Payer: Self-pay | Admitting: Physical Therapy

## 2014-08-30 ENCOUNTER — Encounter: Payer: Self-pay | Admitting: Physical Therapy

## 2014-09-05 ENCOUNTER — Encounter: Payer: Self-pay | Admitting: Physical Therapy

## 2014-09-11 DIAGNOSIS — B078 Other viral warts: Secondary | ICD-10-CM | POA: Diagnosis not present

## 2014-09-11 DIAGNOSIS — L723 Sebaceous cyst: Secondary | ICD-10-CM | POA: Diagnosis not present

## 2014-09-11 DIAGNOSIS — L821 Other seborrheic keratosis: Secondary | ICD-10-CM | POA: Diagnosis not present

## 2014-09-12 ENCOUNTER — Encounter: Payer: Self-pay | Admitting: Physical Therapy

## 2014-09-16 ENCOUNTER — Encounter: Payer: Self-pay | Admitting: Physical Therapy

## 2014-09-18 ENCOUNTER — Encounter: Payer: Self-pay | Admitting: Physical Therapy

## 2014-09-19 ENCOUNTER — Encounter: Payer: Self-pay | Admitting: Physical Therapy

## 2014-09-25 ENCOUNTER — Encounter: Payer: Self-pay | Admitting: *Deleted

## 2014-09-26 ENCOUNTER — Encounter: Payer: Self-pay | Admitting: *Deleted

## 2014-10-04 ENCOUNTER — Other Ambulatory Visit (HOSPITAL_COMMUNITY): Payer: Self-pay | Admitting: Radiology

## 2014-10-04 DIAGNOSIS — N390 Urinary tract infection, site not specified: Secondary | ICD-10-CM | POA: Diagnosis not present

## 2014-10-04 DIAGNOSIS — J918 Pleural effusion in other conditions classified elsewhere: Secondary | ICD-10-CM

## 2014-10-04 DIAGNOSIS — I973 Postprocedural hypertension: Secondary | ICD-10-CM

## 2014-10-04 DIAGNOSIS — K769 Liver disease, unspecified: Principal | ICD-10-CM

## 2014-10-18 ENCOUNTER — Ambulatory Visit (HOSPITAL_COMMUNITY): Payer: Medicaid Other

## 2014-10-25 ENCOUNTER — Ambulatory Visit (HOSPITAL_COMMUNITY): Admission: RE | Admit: 2014-10-25 | Payer: Medicaid Other | Source: Ambulatory Visit

## 2014-10-28 DIAGNOSIS — Z23 Encounter for immunization: Secondary | ICD-10-CM | POA: Diagnosis not present

## 2014-10-28 DIAGNOSIS — B182 Chronic viral hepatitis C: Secondary | ICD-10-CM | POA: Diagnosis not present

## 2014-10-28 DIAGNOSIS — F17200 Nicotine dependence, unspecified, uncomplicated: Secondary | ICD-10-CM | POA: Diagnosis not present

## 2014-10-28 DIAGNOSIS — J449 Chronic obstructive pulmonary disease, unspecified: Secondary | ICD-10-CM | POA: Diagnosis not present

## 2014-10-28 DIAGNOSIS — I10 Essential (primary) hypertension: Secondary | ICD-10-CM | POA: Diagnosis not present

## 2014-11-11 DIAGNOSIS — G629 Polyneuropathy, unspecified: Secondary | ICD-10-CM | POA: Diagnosis not present

## 2014-11-11 DIAGNOSIS — L609 Nail disorder, unspecified: Secondary | ICD-10-CM | POA: Diagnosis not present

## 2014-11-11 DIAGNOSIS — M79671 Pain in right foot: Secondary | ICD-10-CM | POA: Diagnosis not present

## 2014-11-11 DIAGNOSIS — I739 Peripheral vascular disease, unspecified: Secondary | ICD-10-CM | POA: Diagnosis not present

## 2014-11-13 DIAGNOSIS — G8928 Other chronic postprocedural pain: Secondary | ICD-10-CM | POA: Diagnosis not present

## 2014-11-13 DIAGNOSIS — R109 Unspecified abdominal pain: Secondary | ICD-10-CM | POA: Diagnosis not present

## 2014-11-14 ENCOUNTER — Telehealth: Payer: Self-pay | Admitting: Internal Medicine

## 2014-11-14 NOTE — Telephone Encounter (Signed)
On recall for liver ultrasound

## 2014-11-14 NOTE — Telephone Encounter (Signed)
Mailed letter °

## 2014-11-19 ENCOUNTER — Encounter: Payer: Self-pay | Admitting: Obstetrics and Gynecology

## 2014-11-19 ENCOUNTER — Ambulatory Visit (INDEPENDENT_AMBULATORY_CARE_PROVIDER_SITE_OTHER): Payer: Medicare Other | Admitting: Obstetrics and Gynecology

## 2014-11-19 VITALS — BP 120/68 | Ht 65.0 in | Wt 161.0 lb

## 2014-11-19 DIAGNOSIS — L739 Follicular disorder, unspecified: Secondary | ICD-10-CM

## 2014-11-19 DIAGNOSIS — M545 Low back pain: Secondary | ICD-10-CM | POA: Diagnosis not present

## 2014-11-19 DIAGNOSIS — Z1389 Encounter for screening for other disorder: Secondary | ICD-10-CM | POA: Diagnosis not present

## 2014-11-19 DIAGNOSIS — L731 Pseudofolliculitis barbae: Secondary | ICD-10-CM

## 2014-11-19 MED ORDER — NYSTATIN-TRIAMCINOLONE 100000-0.1 UNIT/GM-% EX OINT: 1.0000 "application " | TOPICAL_OINTMENT | Freq: Two times a day (BID) | CUTANEOUS | Status: DC

## 2014-11-19 MED ORDER — SULFAMETHOXAZOLE-TRIMETHOPRIM 800-160 MG PO TABS: 1.0000 | ORAL_TABLET | Freq: Two times a day (BID) | ORAL | Status: DC

## 2014-11-19 NOTE — Addendum Note (Signed)
Addended by: Farley Ly on: 11/19/2014 03:00 PM   Modules accepted: Orders

## 2014-11-19 NOTE — Progress Notes (Signed)
Patient ID: Sabrina Wyatt, female   DOB: [birth year], 64 y.o.   MRN: [id number] Pt here today for   [facility] Visit  Patient name: Sabrina Wyatt MRN [id number]  Date of birth: [birth year]  CC & HPI:  Sabrina Wyatt is a 64 y.o. female presenting today for   rash on her bottom. Pt states that she has noticed it for about a week.  Also notes a hard knot on rt labium, at site of chronic folliculitis ROS:  No fever chills, Pertinent History Reviewed:   Reviewed: Significant for "cured" hep C. Medical         Past Medical History  Diagnosis Date  . GERD (gastroesophageal reflux disease)   . Cirrhosis, hepatitis C     CT on 03/26/13= no HCC, pt has been vaccinated for Hep A and Hep B. s/p treatment of HCV with eradication  . Depression   . Carpal tunnel syndrome   . S/P endoscopy 08/27/10    antral erosions, otherwise normal, due egd 08/2012 to screen for varices  . Abdominal wall pain     chronic  . Pulmonary nodules   . PTSD (post-traumatic stress disorder)   . Chronic low back pain   . 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 Polysubstance abuse     HX of  . S/P colonoscopy 2005    Dr Moss Mc polyp removed, otherwise normal  . Hypertension   . Tobacco abuse   . Intussusception 04/2012  . Shortness of breath   . Seizures 1998    with brain bleed x1. None since then and on no meds  . Chronic abdominal pain   . Biliary stone                               Surgical Hx:    Past Surgical History  Procedure Laterality Date  . Cholecystectomy  2002  . Percutaneous transhepatic cholangiograhpy and billiary drainage  2002  . Roux-en-y-hepatojejunostomy  2003    revision in 2013 for intussusception [facility] Dr. Bailey Mech)  . Spinal fusion      C5-C7  . Carpal tunnel release Bilateral 12/2010  . Esophagogastroduodenoscopy    09/10/2003    Small hiatal hernia; otherwise normal stomach, normal D1 and D2  . Colonoscopy    09/10/2003    diminutive polyp in the  rectum cold biopsied/removed/ Normal colon  . Esophagogastroduodenoscopy  08/27/2010    OVZ:CHYIFO esophagus.  No varices.  Couple of tiny antral erosions of doubtful clinical significance, otherwise normal stomach, D1 and D2.  . Esophagogastroduodenoscopy (egd) with propofol N/A 08/02/2013    RMR: Portal gastropathy. No explanation for abdominal pain which I believe is more abdominal wall in origin. She has been seen by Dr. Carlis Abbott  over at [facility]. She is up-to-date on cross sectional imaging. She has a pain management physician in [place]. Repeat EGD for varices in 3 years.  . Colonoscopy with propofol N/A 08/02/2013    YDX:AJOINOM diverticulosis. next TCS 10 years.  . Biliary stone removal  2015   Medications: Reviewed & Updated - see associated section                       Current outpatient prescriptions:  .  acyclovir (ZOVIRAX) 400 MG tablet, Take 1 tablet (400 mg total) by mouth 2 (two) times daily. Take twice daily for supression during last  weeks of pregnancy, Disp: 60 tablet, Rfl: 11 .  albuterol (PROVENTIL) (2.5 MG/3ML) 0.083% nebulizer solution, Take 2.5 mg by nebulization every 6 (six) hours as needed for wheezing or shortness of breath., Disp: , Rfl:  .  atenolol (TENORMIN) 25 MG tablet, Take 25 mg by mouth daily. , Disp: , Rfl:  .  Cholecalciferol (VITAMIN D-3) 1000 UNITS CAPS, Take 1,000 capsules by mouth daily., Disp: , Rfl:  .  DULoxetine (CYMBALTA) 60 MG capsule, Take 60 mg by mouth 2 (two) times daily. , Disp: , Rfl:  .  EPIPEN 2-PAK 0.3 MG/0.3ML SOAJ injection, , Disp: , Rfl:  .  etodolac (LODINE) 400 MG tablet, Take 400 mg by mouth 2 (two) times daily. , Disp: , Rfl:  .  fluconazole (DIFLUCAN) 150 MG tablet, Take 1 tablet (150 mg total) by mouth once., Disp: 2 tablet, Rfl: 1 .  fluticasone (FLONASE) 50 MCG/ACT nasal spray, Place 2 sprays into the nose daily.  , Disp: , Rfl:  .  lidocaine (LIDODERM) 5 %, Place 1 patch onto the skin daily. Remove & Discard patch within 12  hours or as directed by MD, Disp: , Rfl:  .  loratadine (CLARITIN) 10 MG tablet, Take 10 mg by mouth daily., Disp: , Rfl:  .  Omega-3 Fatty Acids (FISH OIL PO), Take 300-1,000 capsules by mouth daily. Take 2 daily, Disp: , Rfl:  .  omeprazole (PRILOSEC) 20 MG capsule, TAKE ONE CAPSULE BY MOUTH EVERY DAY, Disp: 30 capsule, Rfl: 11 .  Oxycodone HCl 10 MG TABS, 10 mg every 6 hours, Disp: , Rfl:  .  PROAIR HFA 108 (90 BASE) MCG/ACT inhaler, Inhale 2 puffs into the lungs every 6 (six) hours as needed. , Disp: , Rfl:  .  zolpidem (AMBIEN) 5 MG tablet, Take 1 tablet (5 mg total) by mouth at bedtime as needed for sleep., Disp: 30 tablet, Rfl: 0   Social History: Reviewed -  reports that she has been smoking Cigarettes.  She has a 7.5 pack-year smoking history. She has never used smokeless tobacco.  Objective Findings:  Vitals: Blood pressure 120/68, height 5\' 5"  (1.651 m), weight 161 lb (73.029 kg).  Physical Examination: General appearance - alert, well appearing, and in no distress, oriented to person, place, and time, normal appearing weight and in mild to moderate distress Mental status - alert, oriented to person, place, and time Eyes - pupils equal and reactive, extraocular eye movements intact Abdomen - soft, nontender, nondistended, no masses or organomegaly Pelvic - VULVA: vulvar excoriation x 2 sites on left buttock., vulvar nodule on right , =folliculitis   Assessment & Plan:   A:  1. Folliculitis 2. Excoriation,   P:  1. Rx Septra DS bid x10 d 2. Mytrex to vulva 3 r /o uti

## 2014-11-20 LAB — URINALYSIS, ROUTINE W REFLEX MICROSCOPIC
Bilirubin, UA: NEGATIVE
Glucose, UA: NEGATIVE
Ketones, UA: NEGATIVE
Leukocytes, UA: NEGATIVE
Nitrite, UA: NEGATIVE
RBC, UA: NEGATIVE
Specific Gravity, UA: 1.029 (ref 1.005–1.030)
Urobilinogen, Ur: 0.2 mg/dL (ref 0.2–1.0)
pH, UA: 5.5 (ref 5.0–7.5)

## 2014-11-21 LAB — URINE CULTURE: Organism ID, Bacteria: NO GROWTH

## 2014-11-25 ENCOUNTER — Encounter: Payer: Self-pay | Admitting: Gastroenterology

## 2014-11-25 ENCOUNTER — Ambulatory Visit (INDEPENDENT_AMBULATORY_CARE_PROVIDER_SITE_OTHER): Payer: Medicare Other | Admitting: Gastroenterology

## 2014-11-25 VITALS — BP 93/59 | HR 86 | Temp 98.1°F | Ht 65.0 in | Wt 164.4 lb

## 2014-11-25 DIAGNOSIS — K7460 Unspecified cirrhosis of liver: Secondary | ICD-10-CM | POA: Diagnosis not present

## 2014-11-25 DIAGNOSIS — K219 Gastro-esophageal reflux disease without esophagitis: Secondary | ICD-10-CM

## 2014-11-25 NOTE — Progress Notes (Signed)
ON RECALL LIST  °

## 2014-11-25 NOTE — Patient Instructions (Signed)
1. Abdominal u/s as scheduled.  2. Increase omeprazole to 20mg  30 minutes before breakfast and before evening meal over the next few weeks. If you are not feeling better in the next 2 weeks with regards to the upper abdominal discomfort after meals please call the office.

## 2014-11-25 NOTE — Progress Notes (Signed)
Primary Care Physician: Rosita Fire, MD  Primary Gastroenterologist:  Garfield Cornea, MD   Chief Complaint  Patient presents with  . Follow-up    HPI: Sabrina Wyatt is a 64 y.o. female here for follow-up. Last seen in December 2015. 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 History of remote bile duct injury at time of cholecystectomy. Required percutaneous intervention of left hepatic duct stricture with upstream obstructing stone requiring lithotripsy at [facility] in the most recent past. Chronic abdominal pain followed at pain clinic (Dr. Lovenia Shuck). 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 [facility] with [facility] Bailey [facility] in [facility]. She had exploratory laparotomy with resection of jejunostomy, and end-to-end 2 layer handsewn jejunostomy, lysis of adhesions. No evidence of small bowel tumor. History of pulmonary nodules with repeat CT of the chest recommended, which was done in 05/2014 and no further evaluation needed.  Overall clinically stable. Continues to follow with pain management for chronic abdominal wall pain. Physical therapy for abdominal wall desensitization ongoing. BM regular. No melena, brbpr. Over the past couple weeks she's had increased indigestion. Describes epigastric discomfort as if she swallowed a bunch of air. "Feels like an [place] burn". Last for a couple of minutes at a time. No vomiting. No dysphagia. No weight loss.   Current Outpatient Prescriptions  Medication Sig Dispense Refill  . acyclovir (ZOVIRAX) 400 MG tablet Take 1 tablet (400 mg total) by mouth 2 (two) times daily. Take twice daily for supression during last weeks of pregnancy 60 tablet 11  . albuterol (PROVENTIL) (2.5 MG/3ML) 0.083% nebulizer solution Take 2.5 mg by nebulization every 6 (six) hours as needed for wheezing or shortness of breath.    Marland Kitchen atenolol (TENORMIN) 25 MG tablet Take 25 mg by mouth daily.     .  Cholecalciferol (VITAMIN D-3) 1000 UNITS CAPS Take 1,000 capsules by mouth daily.    . DULoxetine (CYMBALTA) 60 MG capsule Take 60 mg by mouth 2 (two) times daily.     Marland Kitchen EPIPEN 2-PAK 0.3 MG/0.3ML SOAJ injection     . etodolac (LODINE) 400 MG tablet Take 400 mg by mouth 2 (two) times daily.     . fluconazole (DIFLUCAN) 150 MG tablet Take 1 tablet (150 mg total) by mouth once. 2 tablet 1  . fluticasone (FLONASE) 50 MCG/ACT nasal spray Place 2 sprays into the nose daily.      Marland Kitchen lidocaine (LIDODERM) 5 % Place 1 patch onto the skin daily. Remove & Discard patch within 12 hours or as directed by MD    . loratadine (CLARITIN) 10 MG tablet Take 10 mg by mouth daily.    Marland Kitchen nystatin-triamcinolone ointment (MYCOLOG) Apply 1 application topically 2 (two) times daily. To affected area. 30 g prn  . Omega-3 Fatty Acids (FISH OIL PO) Take 300-1,000 capsules by mouth daily. Take 2 daily    . omeprazole (PRILOSEC) 20 MG capsule TAKE ONE CAPSULE BY MOUTH EVERY DAY 30 capsule 11  . Oxycodone HCl 10 MG TABS 10 mg every 6 hours    . PROAIR HFA 108 (90 BASE) MCG/ACT inhaler Inhale 2 puffs into the lungs every 6 (six) hours as needed.      No current facility-administered medications for this visit.    Allergies as of 11/25/2014  . (No Known Allergies)    ROS:  General: Negative for anorexia, weight loss, fever, chills, fatigue, weakness. ENT: Negative for hoarseness, difficulty swallowing , nasal  congestion. CV: Negative for chest pain, angina, palpitations, dyspnea on exertion, peripheral edema.  Respiratory: Negative for dyspnea at rest, dyspnea on exertion, cough, sputum, wheezing.  GI: See history of present illness. GU:  Negative for dysuria, hematuria, urinary incontinence, urinary frequency, nocturnal urination.  Endo: Negative for unusual weight change.    Physical Examination:   BP 93/59 mmHg  Pulse 86  Temp(Src) 98.1 F (36.7 C) (Oral)  Ht 5\' 5"  (1.651 m)  Wt 164 lb 6.4 oz (74.571 kg)  BMI  27.36 kg/m2  General: Well-nourished, well-developed in no acute distress.  Eyes: No icterus. Mouth: Oropharyngeal mucosa moist and pink , no lesions erythema or exudate. Lungs: Clear to auscultation bilaterally.  Heart: Regular rate and rhythm, no murmurs rubs or gallops.  Abdomen: Bowel sounds are normal, nontender, nondistended, no hepatosplenomegaly or masses, no abdominal bruits or hernia , no rebound or guarding.  Multiple surgical scars present. Extremities: No lower extremity edema. No clubbing or deformities. Neuro: Alert and oriented x 4   Skin: Warm and dry, no jaundice.   Psych: Alert and cooperative, normal mood and affect.  Labs:  Lab Results  Component Value Date   ALT 14 07/30/2014   AST 17 07/30/2014   ALKPHOS 103 07/30/2014   BILITOT 0.4 07/30/2014    Imaging Studies: No results found.

## 2014-11-25 NOTE — Progress Notes (Signed)
Please NIC for labs in 01/2015 (CMET, CBC, PT/INR).  Needs OV with RMR only in 05/2015.

## 2014-11-25 NOTE — Progress Notes (Signed)
cc'ed to pcp °

## 2014-11-25 NOTE — Assessment & Plan Note (Signed)
Discussed at length with patient today. Wants to go ahead with abdominal ultrasound for hepatoma surveillance although month or so early. She will be due for labs in June, CBC, INR, CMET. Consider EGD later this year for esophageal variceal screening per Dr. Roseanne Kaufman last note. Last EGD 2014.  Continues to have some epigastric discomfort related to meals possibly reflux related. History of left hepatic duct stricture with upstream obstructing stone requiring lithotripsy and stenting last year, MRI several months ago without evidence of ongoing obstruction. LFTs normal. Increase omeprazole to 20mg  BID for next few weeks. If ongoing symptoms she will let me know otherwise return to the office in six months.

## 2014-11-26 ENCOUNTER — Other Ambulatory Visit: Payer: Self-pay | Admitting: Gastroenterology

## 2014-11-26 DIAGNOSIS — K7460 Unspecified cirrhosis of liver: Secondary | ICD-10-CM

## 2014-11-26 NOTE — Progress Notes (Signed)
Lab orders on file. 

## 2014-11-27 ENCOUNTER — Ambulatory Visit (HOSPITAL_COMMUNITY)
Admission: RE | Admit: 2014-11-27 | Discharge: 2014-11-27 | Disposition: A | Discharge status: Home / Self Care | Payer: Medicare Other | Source: Ambulatory Visit | Attending: Gastroenterology | Admitting: Gastroenterology

## 2014-11-27 DIAGNOSIS — K7460 Unspecified cirrhosis of liver: Secondary | ICD-10-CM | POA: Insufficient documentation

## 2014-11-27 DIAGNOSIS — Z9889 Other specified postprocedural states: Secondary | ICD-10-CM | POA: Diagnosis not present

## 2014-11-27 DIAGNOSIS — B1920 Unspecified viral hepatitis C without hepatic coma: Secondary | ICD-10-CM | POA: Insufficient documentation

## 2014-11-27 DIAGNOSIS — R161 Splenomegaly, not elsewhere classified: Secondary | ICD-10-CM | POA: Diagnosis not present

## 2014-11-27 DIAGNOSIS — K219 Gastro-esophageal reflux disease without esophagitis: Secondary | ICD-10-CM

## 2014-11-27 DIAGNOSIS — B1710 Acute hepatitis C without hepatic coma: Secondary | ICD-10-CM | POA: Diagnosis not present

## 2014-11-27 NOTE — Progress Notes (Signed)
Quick Note:  U/S stable. No liver masses. No biliary dilation or stones seen.  NIC for abd u/s in six months for hepatoma surveillance. ______

## 2014-11-28 NOTE — Progress Notes (Signed)
ON RECALL FOR ULTRASOUND  °

## 2014-12-09 ENCOUNTER — Ambulatory Visit: Payer: Self-pay | Admitting: Physical Therapy

## 2014-12-11 ENCOUNTER — Encounter: Payer: Self-pay | Admitting: Physical Therapy

## 2015-01-07 ENCOUNTER — Telehealth: Payer: Self-pay | Admitting: Internal Medicine

## 2015-01-07 NOTE — Telephone Encounter (Signed)
Lab orders are on file.  

## 2015-01-07 NOTE — Telephone Encounter (Signed)
ON June RECALL FOR CMET, CBC, PT/INR

## 2015-01-13 ENCOUNTER — Other Ambulatory Visit: Payer: Self-pay

## 2015-01-13 DIAGNOSIS — K7460 Unspecified cirrhosis of liver: Secondary | ICD-10-CM

## 2015-01-14 ENCOUNTER — Ambulatory Visit: Payer: Self-pay | Admitting: Physical Therapy

## 2015-01-21 ENCOUNTER — Ambulatory Visit: Payer: Self-pay | Admitting: Physical Therapy

## 2015-01-27 ENCOUNTER — Ambulatory Visit: Payer: Self-pay | Admitting: Gastroenterology

## 2015-01-31 DIAGNOSIS — K7460 Unspecified cirrhosis of liver: Secondary | ICD-10-CM | POA: Diagnosis not present

## 2015-02-01 LAB — COMPREHENSIVE METABOLIC PANEL
ALT: 14 U/L (ref 0–35)
AST: 21 U/L (ref 0–37)
Albumin: 4.5 g/dL (ref 3.5–5.2)
Alkaline Phosphatase: 92 U/L (ref 39–117)
BUN: 12 mg/dL (ref 6–23)
CO2: 23 mEq/L (ref 19–32)
Calcium: 9.4 mg/dL (ref 8.4–10.5)
Chloride: 104 mEq/L (ref 96–112)
Creat: 0.74 mg/dL (ref 0.50–1.10)
Glucose, Bld: 94 mg/dL (ref 70–99)
Potassium: 4.1 mEq/L (ref 3.5–5.3)
Sodium: 140 mEq/L (ref 135–145)
Total Bilirubin: 0.7 mg/dL (ref 0.2–1.2)
Total Protein: 7.4 g/dL (ref 6.0–8.3)

## 2015-02-01 LAB — CBC WITH DIFFERENTIAL/PLATELET
Basophils Absolute: 0.1 10*3/uL (ref 0.0–0.1)
Basophils Relative: 1 % (ref 0–1)
Eosinophils Absolute: 0.2 10*3/uL (ref 0.0–0.7)
Eosinophils Relative: 2 % (ref 0–5)
HCT: 42.2 % (ref 36.0–46.0)
Hemoglobin: 13.9 g/dL (ref 12.0–15.0)
Lymphocytes Relative: 26 % (ref 12–46)
Lymphs Abs: 2.9 10*3/uL (ref 0.7–4.0)
MCH: 29.3 pg (ref 26.0–34.0)
MCHC: 32.9 g/dL (ref 30.0–36.0)
MCV: 88.8 fL (ref 78.0–100.0)
MPV: 11.8 fL (ref 8.6–12.4)
Monocytes Absolute: 0.6 10*3/uL (ref 0.1–1.0)
Monocytes Relative: 5 % (ref 3–12)
Neutro Abs: 7.3 10*3/uL (ref 1.7–7.7)
Neutrophils Relative %: 66 % (ref 43–77)
Platelets: 203 10*3/uL (ref 150–400)
RBC: 4.75 MIL/uL (ref 3.87–5.11)
RDW: 18 % — ABNORMAL HIGH (ref 11.5–15.5)
WBC: 11.1 10*3/uL — ABNORMAL HIGH (ref 4.0–10.5)

## 2015-02-01 LAB — HEPATIC FUNCTION PANEL
ALT: 15 U/L (ref 0–35)
AST: 19 U/L (ref 0–37)
Albumin: 4.4 g/dL (ref 3.5–5.2)
Alkaline Phosphatase: 101 U/L (ref 39–117)
Bilirubin, Direct: 0.2 mg/dL (ref 0.0–0.3)
Indirect Bilirubin: 0.5 mg/dL (ref 0.2–1.2)
Total Bilirubin: 0.7 mg/dL (ref 0.2–1.2)
Total Protein: 7.4 g/dL (ref 6.0–8.3)

## 2015-02-01 LAB — PROTIME-INR
INR: 1.05 (ref <1.50)
Prothrombin Time: 13.7 seconds (ref 11.6–15.2)

## 2015-02-03 ENCOUNTER — Other Ambulatory Visit: Payer: Self-pay | Admitting: Gastroenterology

## 2015-02-03 DIAGNOSIS — K7460 Unspecified cirrhosis of liver: Principal | ICD-10-CM

## 2015-02-03 DIAGNOSIS — B182 Chronic viral hepatitis C: Secondary | ICD-10-CM

## 2015-02-03 NOTE — Progress Notes (Signed)
Quick Note:  Please let patient know her LFTs, kidneys, INR all normal. Her WBC is up slightly.  We can recheck it next time with her other labs in six months or before then in 3 months (CBC only). Whichever she prefers. Likely insignificant, especially if she had any recent inflammation or infection.   LFTs, CBC, PT/INR, Met-7 in 6 months.   ______

## 2015-02-05 DIAGNOSIS — R109 Unspecified abdominal pain: Secondary | ICD-10-CM | POA: Diagnosis not present

## 2015-02-05 DIAGNOSIS — G8928 Other chronic postprocedural pain: Secondary | ICD-10-CM | POA: Diagnosis not present

## 2015-02-05 DIAGNOSIS — M961 Postlaminectomy syndrome, not elsewhere classified: Secondary | ICD-10-CM | POA: Diagnosis not present

## 2015-02-05 DIAGNOSIS — M5440 Lumbago with sciatica, unspecified side: Secondary | ICD-10-CM | POA: Diagnosis not present

## 2015-02-17 ENCOUNTER — Ambulatory Visit (INDEPENDENT_AMBULATORY_CARE_PROVIDER_SITE_OTHER): Payer: Medicare Other | Admitting: Gastroenterology

## 2015-02-17 ENCOUNTER — Other Ambulatory Visit: Payer: Self-pay

## 2015-02-17 ENCOUNTER — Encounter: Payer: Self-pay | Admitting: Gastroenterology

## 2015-02-17 VITALS — BP 129/82 | HR 92 | Temp 97.6°F | Ht 65.0 in | Wt 150.6 lb

## 2015-02-17 DIAGNOSIS — K7460 Unspecified cirrhosis of liver: Secondary | ICD-10-CM | POA: Diagnosis not present

## 2015-02-17 DIAGNOSIS — K219 Gastro-esophageal reflux disease without esophagitis: Secondary | ICD-10-CM

## 2015-02-17 DIAGNOSIS — R634 Abnormal weight loss: Secondary | ICD-10-CM | POA: Insufficient documentation

## 2015-02-17 DIAGNOSIS — R1013 Epigastric pain: Secondary | ICD-10-CM | POA: Diagnosis not present

## 2015-02-17 DIAGNOSIS — R110 Nausea: Secondary | ICD-10-CM

## 2015-02-17 NOTE — Patient Instructions (Signed)
1. Please have your labs done. 2. Upper endoscopy with Dr. Gala Romney. See separate instructions.

## 2015-02-17 NOTE — Assessment & Plan Note (Signed)
Likely related to increased activity and dietary changes. However in light of ongoing nausea and epigastric pain, EGD as planned.

## 2015-02-17 NOTE — Progress Notes (Signed)
cc'd to pcp 

## 2015-02-17 NOTE — Assessment & Plan Note (Signed)
Acute on chronic abdominal pain associated with nausea and weight loss. NSAID use daily. Chronic PPI therapy. Recent labs and ultrasound showed no evidence recurrent biliary obstruction. History of intussusception in the past as outlined requiring revision of previous Roux-en-Y hepatojejunostomy. Discussed with Dr. Gala Romney. We will check a lipase. Recheck her CBC given recent mild leukocytosis. Plan on EGD for evaluation of epigastric pain, rule out peptic ulcer disease and/or gastritis in the setting of NSAIDs. She will require deep sedation given chronic polypharmacy.  I have discussed the risks, alternatives, benefits with regards to but not limited to the risk of reaction to medication, bleeding, infection, perforation and the patient is agreeable to proceed. Written consent to be obtained.

## 2015-02-17 NOTE — Progress Notes (Signed)
Primary Care Physician: Rosita Fire, MD  Primary Gastroenterologist:  Garfield Cornea, MD   Chief Complaint  Patient presents with  . Follow-up    HPI: Sabrina Wyatt is a 64 y.o. female here follow-up. Last seen in December 2015. 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 History of remote bile duct injury at time of cholecystectomy. Required percutaneous intervention of left hepatic duct stricture with upstream obstructing stone requiring lithotripsy at [facility] in the most recent past. Chronic abdominal pain followed at pain clinic (Dr. Lovenia Shuck). 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 Surgery with Dr. Bailey Mech at [facility] in 05/2012. She had exploratory laparotomy with resection of jejunostomy, and end-to-end 2 layer handsewn jejunostomy, lysis of adhesions. No evidence of small bowel tumor. History of pulmonary nodules with repeat CT of the chest recommended, which was done in 05/2014 and no further evaluation needed.  Patient seen in follow-up back in April with complaints of epigastric discomfort, at the time felt like "[place] burn". Given her history of hepatic duct stricture in the past repeated labs which showed normal LFTs. Abdominal ultrasound with no biliary dilation. Increased her omeprazole to twice a day for couple of weeks. She states it helped only a little and she went back to once daily dosing. Epigastric discomfort worsened last 2-3 weeks. Constant. Unrelated to meals. Complains of a lot of nausea but no vomiting. Blamed a lot of her symptoms on recent move and the heat.    BMs okay. Denies blood in the stool or melena. Weight down at least 10 pounds. Feels like eating well. Eating pretty healthy. More active than usual.   Continues Lodine twice a day.   Current Outpatient Prescriptions  Medication Sig Dispense Refill  . acyclovir (ZOVIRAX) 400 MG tablet Take 1 tablet (400 mg total) by mouth  2 (two) times daily. Take twice daily for supression during last weeks of pregnancy 60 tablet 11  . albuterol (PROVENTIL) (2.5 MG/3ML) 0.083% nebulizer solution Take 2.5 mg by nebulization every 6 (six) hours as needed for wheezing or shortness of breath.    Marland Kitchen atenolol (TENORMIN) 25 MG tablet Take 25 mg by mouth daily.     . Cholecalciferol (VITAMIN D-3) 1000 UNITS CAPS Take 1,000 capsules by mouth daily.    . DULoxetine (CYMBALTA) 60 MG capsule Take 60 mg by mouth 2 (two) times daily.     Marland Kitchen EPIPEN 2-PAK 0.3 MG/0.3ML SOAJ injection     . etodolac (LODINE) 400 MG tablet Take 400 mg by mouth 2 (two) times daily.     . fluticasone (FLONASE) 50 MCG/ACT nasal spray Place 2 sprays into the nose daily.      Marland Kitchen lidocaine (LIDODERM) 5 % Place 1 patch onto the skin daily. Remove & Discard patch within 12 hours or as directed by MD    . loratadine (CLARITIN) 10 MG tablet Take 10 mg by mouth daily.    Marland Kitchen nystatin-triamcinolone ointment (MYCOLOG) Apply 1 application topically 2 (two) times daily. To affected area. 30 g prn  . Omega-3 Fatty Acids (FISH OIL PO) Take 300-1,000 capsules by mouth daily. Take 2 daily    . omeprazole (PRILOSEC) 20 MG capsule TAKE ONE CAPSULE BY MOUTH EVERY DAY 30 capsule 11  . Oxycodone HCl 10 MG TABS 10 mg every 6 hours    . PROAIR HFA 108 (90 BASE) MCG/ACT inhaler Inhale 2 puffs into the lungs every 6 (six) hours as needed.  No current facility-administered medications for this visit.    Allergies as of 02/17/2015  . (No Known Allergies)   Past Medical History  Diagnosis Date  . GERD (gastroesophageal reflux disease)   . Cirrhosis, hepatitis C     CT on 03/26/13= no HCC, pt has been vaccinated for Hep A and Hep B. s/p treatment of HCV with eradication  . Depression   . Carpal tunnel syndrome   . S/P endoscopy 08/27/10    antral erosions, otherwise normal, due egd 08/2012 to screen for varices  . Abdominal wall pain     chronic  . Pulmonary nodules   . PTSD  (post-traumatic stress disorder)   . Chronic low back pain   . 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 Polysubstance abuse     HX of  . S/P colonoscopy 2005    Dr Moss Mc polyp removed, otherwise normal  . Hypertension   . Tobacco abuse   . Intussusception 04/2012  . Shortness of breath   . Seizures 1998    with brain bleed x1. None since then and on no meds  . Chronic abdominal pain   . Biliary stone    Past Surgical History  Procedure Laterality Date  . Cholecystectomy  2002  . Percutaneous transhepatic cholangiograhpy and billiary drainage  2002  . Roux-en-y-hepatojejunostomy  2003    revision in 2013 for intussusception [facility] Dr. Bailey Mech)  . Spinal fusion      C5-C7  . Carpal tunnel release Bilateral 12/2010  . Esophagogastroduodenoscopy    09/10/2003    Small hiatal hernia; otherwise normal stomach, normal D1 and D2  . Colonoscopy    09/10/2003    diminutive polyp in the rectum cold biopsied/removed/ Normal colon  . Esophagogastroduodenoscopy  08/27/2010    URK:YHCWCB esophagus.  No varices.  Couple of tiny antral erosions of doubtful clinical significance, otherwise normal stomach, D1 and D2.  . Esophagogastroduodenoscopy (egd) with propofol N/A 08/02/2013    RMR: Portal gastropathy. No explanation for abdominal pain which I believe is more abdominal wall in origin. She has been seen by Dr. Carlis Abbott  over at [facility]. She is up-to-date on cross sectional imaging. She has a pain management physician in [place]. Repeat EGD for varices in 3 years.  . Colonoscopy with propofol N/A 08/02/2013    JSE:GBTDVVO diverticulosis. next TCS 10 years.  . Biliary stone removal  2015   Family History  Problem Relation Age of Onset  . Coronary artery disease Brother   . Cancer Sister     lymphoma  . Cancer Father     brain   History   Social History  . Marital Status: Single    Spouse Name: N/A  . Number of Children: 1  . Years of Education: N/A   Occupational  History  . disabled    Social History Main Topics  . Smoking status: Current Some Day Smoker -- 0.25 packs/day for 30 years    Types: Cigarettes  . Smokeless tobacco: Never Used     Comment: One pack every 6-7 days  . Alcohol Use: Yes     Comment: social   . Drug Use: No     Comment: remote in teens-marijuana, heroin, cocaine  . Sexual Activity: No   Other Topics Concern  . None   Social History Narrative         ROS:  General: Negative for anorexia,   fever, chills, fatigue, weakness. See hpi Eyes: Negative for  vision changes.  ENT: Negative for hoarseness, difficulty swallowing , nasal congestion. CV: Negative for chest pain, angina, palpitations, dyspnea on exertion, peripheral edema.  Respiratory: Negative for dyspnea at rest, dyspnea on exertion, cough, sputum, wheezing.  GI: See history of present illness. GU:  Negative for dysuria, hematuria, urinary incontinence, urinary frequency, nocturnal urination.  MS: Negative for joint pain, low back pain.  Derm: Negative for rash or itching.  Neuro: Negative for weakness, abnormal sensation, seizure, frequent headaches, memory loss, confusion.  Psych: Negative for anxiety, depression, suicidal ideation, hallucinations.  Endo: see hpi Heme: Negative for bruising or bleeding. Allergy: Negative for rash or hives.   Physical Examination:   BP 129/82 mmHg  Pulse 92  Temp(Src) 97.6 F (36.4 C) (Oral)  Ht 5\' 5"  (1.651 m)  Wt 150 lb 9.6 oz (68.312 kg)  BMI 25.06 kg/m2  Physical Examination:   General: Well-nourished, well-developed in no acute distress.  Head: Normocephalic, atraumatic.   Eyes: Conjunctiva pink, no icterus. Mouth: Oropharyngeal mucosa moist and pink , no lesions erythema or exudate. Neck: Supple without thyromegaly, masses, or lymphadenopathy.  Lungs: Clear to auscultation bilaterally.  Heart: Regular rate and rhythm, no murmurs rubs or gallops.  Abdomen: Bowel sounds are normal, moderate epigastric  tenderness, nondistended, no hepatosplenomegaly or masses, no abdominal bruits or    hernia , no rebound or guarding.  Multiple well-healed incisdions Extremities: No lower extremity edema.  Neuro: Alert and oriented x 4 , grossly normal neurologically.  Skin: Warm and dry, no rash or jaundice.   Psych: Alert and cooperative, normal mood and affect.    Labs:  Lab Results  Component Value Date   WBC 11.1* 01/31/2015   HGB 13.9 01/31/2015   HCT 42.2 01/31/2015   MCV 88.8 01/31/2015   PLT 203 01/31/2015   Lab Results  Component Value Date   CREATININE 0.74 01/31/2015   BUN 12 01/31/2015   NA 140 01/31/2015   K 4.1 01/31/2015   CL 104 01/31/2015   CO2 23 01/31/2015   Lab Results  Component Value Date   ALT 15 01/31/2015   AST 19 01/31/2015   ALKPHOS 101 01/31/2015   BILITOT 0.7 01/31/2015   Lab Results  Component Value Date   INR 1.05 01/31/2015   INR 1.00 02/14/2013   INR 1.00 05/20/2011     Imaging Studies: No results found.

## 2015-02-17 NOTE — Assessment & Plan Note (Signed)
Currently up-to-date on labs and imaging. Reevaluate for esophageal varices at time of upcoming EGD. Plan to return to the office in October to see Dr. Gala Romney for follow-up.

## 2015-02-19 ENCOUNTER — Ambulatory Visit: Payer: Medicare Other | Admitting: Obstetrics and Gynecology

## 2015-02-20 ENCOUNTER — Ambulatory Visit: Payer: Self-pay | Admitting: Physical Therapy

## 2015-02-25 ENCOUNTER — Ambulatory Visit: Payer: Medicare Other | Admitting: Obstetrics and Gynecology

## 2015-02-26 NOTE — Patient Instructions (Addendum)
Sabrina Wyatt  02/26/2015     Your procedure is scheduled on  03/06/2015  Report to [facility] at  9:30  A.M.  Call this number if you have problems the morning of surgery:  [phone]   Remember:  Do not eat food or drink liquids as directed by Dr. Roseanne Kaufman instructions.  Take these medicines the morning of surgery with A SIP OF WATER:  Atenolol, Cymbalta and Omeprazole. You may take your Oxycodone, Phenergan and Clonidine if needed. Use your ProAir inhaler before coming.   Do not wear jewelry, make-up or nail polish.  Do not wear lotions, powders, or perfumes.     Do not shave 48 hours prior to surgery.  Men may shave face and neck.  Do not bring valuables to the hospital.  [facility] is not responsible for any belongings or valuables.  Contacts, dentures or bridgework may not be worn into surgery.  Leave your suitcase in the car.  After surgery it may be brought to your room.  For patients admitted to the hospital, discharge time will be determined by your treatment team.  Patients discharged the day of surgery will not be allowed to drive home.   Special instructions:  Follow your diet instructions given to you by Dr Roseanne Kaufman office.  Please read over the following fact sheets that you were given. Pain Booklet, Coughing and Deep Breathing, Surgical Site Infection Prevention, Anesthesia Post-op Instructions and Care and Recovery After Surgery      Esophagogastroduodenoscopy Esophagogastroduodenoscopy (EGD) is a procedure to examine the lining of the esophagus, stomach, and first part of the small intestine (duodenum). A long, flexible, lighted tube with a camera attached (endoscope) is inserted down the throat to view these organs. This procedure is done to detect problems or abnormalities, such as inflammation, bleeding, ulcers, or growths, in order to treat them. The procedure lasts about 5-20 minutes. It is usually an outpatient procedure, but it may need to be performed  in emergency cases in the hospital. LET YOUR CAREGIVER KNOW ABOUT:   Allergies to food or medicine.  All medicines you are taking, including vitamins, herbs, eyedrops, and over-the-counter medicines and creams.  Use of steroids (by mouth or creams).  Previous problems you or members of your family have had with the use of anesthetics.  Any blood disorders you have.  Previous surgeries you have had.  Other health problems you have.  Possibility of pregnancy, if this applies. RISKS AND COMPLICATIONS  Generally, EGD is a safe procedure. However, as with any procedure, complications can occur. Possible complications include:  Infection.  Bleeding.  Tearing (perforation) of the esophagus, stomach, or duodenum.  Difficulty breathing or not being able to breath.  Excessive sweating.  Spasms of the larynx.  Slowed heartbeat.  Low blood pressure. BEFORE THE PROCEDURE  Do not eat or drink anything for 6-8 hours before the procedure or as directed by your caregiver.  Ask your caregiver about changing or stopping your regular medicines.  If you wear dentures, be prepared to remove them before the procedure.  Arrange for someone to drive you home after the procedure. PROCEDURE   A vein will be accessed to give medicines and fluids. A medicine to relax you (sedative) and a pain reliever will be given through that access into the vein.  A numbing medicine (local anesthetic) may be sprayed on your throat for comfort and to stop you from gagging or coughing.  A mouth guard may be placed in  your mouth to protect your teeth and to keep you from biting on the endoscope.  You will be asked to lie on your left side.  The endoscope is inserted down your throat and into the esophagus, stomach, and duodenum.  Air is put through the endoscope to allow your caregiver to view the lining of your esophagus clearly.  The esophagus, stomach, and duodenum is then examined. During the exam,  your caregiver may:  Remove tissue to be examined under a microscope (biopsy) for inflammation, infection, or other medical problems.  Remove growths.  Remove objects (foreign bodies) that are stuck.  Treat any bleeding with medicines or other devices that stop tissues from bleeding (hot cautery, clipping devices).  Widen (dilate) or stretch narrowed areas of the esophagus and stomach.  The endoscope will then be withdrawn. AFTER THE PROCEDURE  You will be taken to a recovery area to be monitored. You will be able to go home once you are stable and alert.  Do not eat or drink anything until the local anesthetic and numbing medicines have worn off. You may choke.  It is normal to feel bloated, have pain with swallowing, or have a sore throat for a short time. This will wear off.  Your caregiver should be able to discuss his or her findings with you. It will take longer to discuss the test results if any biopsies were taken. Document Released: 12/10/2004 Document Revised: 12/24/2013 Document Reviewed: 07/12/2012 [facility] Patient Information 2015 [place], [place]. This information is not intended to replace advice given to you by your health care provider. Make sure you discuss any questions you have with your health care provider. PATIENT INSTRUCTIONS POST-ANESTHESIA  IMMEDIATELY FOLLOWING SURGERY:  Do not drive or operate machinery for the first twenty four hours after surgery.  Do not make any important decisions for twenty four hours after surgery or while taking narcotic pain medications or sedatives.  If you develop intractable nausea and vomiting or a severe headache please notify your doctor immediately.  FOLLOW-UP:  Please make an appointment with your surgeon as instructed. You do not need to follow up with anesthesia unless specifically instructed to do so.  WOUND CARE INSTRUCTIONS (if applicable):  Keep a dry clean dressing on the anesthesia/puncture wound site if there is  drainage.  Once the wound has quit draining you may leave it open to air.  Generally you should leave the bandage intact for twenty four hours unless there is drainage.  If the epidural site drains for more than 36-48 hours please call the anesthesia department.  QUESTIONS?:  Please feel free to call your physician or the hospital operator if you have any questions, and they will be happy to assist you.

## 2015-02-28 ENCOUNTER — Other Ambulatory Visit: Payer: Self-pay

## 2015-02-28 ENCOUNTER — Encounter (HOSPITAL_COMMUNITY): Payer: Self-pay

## 2015-02-28 ENCOUNTER — Encounter (HOSPITAL_COMMUNITY)
Admission: RE | Admit: 2015-02-28 | Discharge: 2015-02-28 | Disposition: A | Discharge status: Home / Self Care | Payer: Medicare Other | Source: Ambulatory Visit | Attending: Internal Medicine | Admitting: Internal Medicine

## 2015-02-28 DIAGNOSIS — R1013 Epigastric pain: Secondary | ICD-10-CM | POA: Insufficient documentation

## 2015-02-28 DIAGNOSIS — R634 Abnormal weight loss: Secondary | ICD-10-CM | POA: Insufficient documentation

## 2015-02-28 DIAGNOSIS — R110 Nausea: Secondary | ICD-10-CM | POA: Diagnosis not present

## 2015-02-28 DIAGNOSIS — Z01818 Encounter for other preprocedural examination: Secondary | ICD-10-CM | POA: Insufficient documentation

## 2015-02-28 LAB — CBC WITH DIFFERENTIAL/PLATELET
Basophils Absolute: 0.1 10*3/uL (ref 0.0–0.1)
Basophils Relative: 1 % (ref 0–1)
Eosinophils Absolute: 0.3 10*3/uL (ref 0.0–0.7)
Eosinophils Relative: 4 % (ref 0–5)
HCT: 40.8 % (ref 36.0–46.0)
Hemoglobin: 13.3 g/dL (ref 12.0–15.0)
Lymphocytes Relative: 32 % (ref 12–46)
Lymphs Abs: 2.4 10*3/uL (ref 0.7–4.0)
MCH: 29.6 pg (ref 26.0–34.0)
MCHC: 32.6 g/dL (ref 30.0–36.0)
MCV: 90.7 fL (ref 78.0–100.0)
MPV: 11.5 fL (ref 8.6–12.4)
Monocytes Absolute: 0.6 10*3/uL (ref 0.1–1.0)
Monocytes Relative: 8 % (ref 3–12)
Neutro Abs: 4.2 10*3/uL (ref 1.7–7.7)
Neutrophils Relative %: 55 % (ref 43–77)
Platelets: 150 10*3/uL (ref 150–400)
RBC: 4.5 MIL/uL (ref 3.87–5.11)
RDW: 16.3 % — ABNORMAL HIGH (ref 11.5–15.5)
WBC: 7.6 10*3/uL (ref 4.0–10.5)

## 2015-02-28 LAB — LIPASE: Lipase: 27 U/L (ref 0–75)

## 2015-03-03 ENCOUNTER — Encounter: Payer: Self-pay | Admitting: Obstetrics and Gynecology

## 2015-03-03 ENCOUNTER — Ambulatory Visit (INDEPENDENT_AMBULATORY_CARE_PROVIDER_SITE_OTHER): Payer: Medicare Other | Admitting: Obstetrics and Gynecology

## 2015-03-03 VITALS — BP 130/86 | Ht 65.0 in | Wt 146.0 lb

## 2015-03-03 DIAGNOSIS — R300 Dysuria: Secondary | ICD-10-CM

## 2015-03-03 LAB — POCT URINALYSIS DIPSTICK
Blood, UA: 4
Glucose, UA: NEGATIVE
Nitrite, UA: NEGATIVE
Protein, UA: 2

## 2015-03-03 MED ORDER — SULFAMETHOXAZOLE-TRIMETHOPRIM 800-160 MG PO TABS: 1.0000 | ORAL_TABLET | Freq: Two times a day (BID) | ORAL | Status: DC

## 2015-03-03 NOTE — Progress Notes (Signed)
Patient ID: FE OKUBO, female   DOB: 06-Aug-1951, 64 y.o.   MRN: [id number]    [facility] Visit  Patient name: Sabrina Wyatt MRN [id number]  Date of birth: 14-Sep-1950  CC & HPI:  Sabrina Wyatt is a 64 y.o. female presenting today for intermittent, moderate dysuria that started a few days ago. She states urinary frequency as an associated symptom. Pt reports decreased urine, except in the morning. She denies allergies to any medication.  ROS:  A complete 10 system review of systems was obtained and all systems are negative except as noted in the HPI and PMH.   Pertinent History Reviewed:   Reviewed: Significant. Medical         Past Medical History  Diagnosis Date  . GERD (gastroesophageal reflux disease)   . Cirrhosis, hepatitis C     CT on 03/26/13= no HCC, pt has been vaccinated for Hep A and Hep B. s/p treatment of HCV with eradication  . Depression   . Carpal tunnel syndrome   . S/P endoscopy 08/27/10    antral erosions, otherwise normal, due egd 08/2012 to screen for varices  . Abdominal wall pain     chronic  . Pulmonary nodules   . PTSD (post-traumatic stress disorder)   . Chronic low back pain   . 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 Polysubstance abuse     HX of  . S/P colonoscopy 2005    Dr Moss Mc polyp removed, otherwise normal  . Hypertension   . Tobacco abuse   . Intussusception 04/2012  . Shortness of breath   . Chronic abdominal pain   . Biliary stone   . Seizures 1998    with brain bleed x1. None since then and on no meds                              Surgical Hx:    Past Surgical History  Procedure Laterality Date  . Cholecystectomy  2002  . Percutaneous transhepatic cholangiograhpy and billiary drainage  2002  . Roux-en-y-hepatojejunostomy  2003    revision in 2013 for intussusception [facility] Dr. Bailey Mech)  . Spinal fusion      C5-C7  . Carpal tunnel release Bilateral 12/2010  . Esophagogastroduodenoscopy     09/10/2003    Small hiatal hernia; otherwise normal stomach, normal D1 and D2  . Colonoscopy    09/10/2003    diminutive polyp in the rectum cold biopsied/removed/ Normal colon  . Esophagogastroduodenoscopy  08/27/2010    OHY:WVPXTG esophagus.  No varices.  Couple of tiny antral erosions of doubtful clinical significance, otherwise normal stomach, D1 and D2.  . Esophagogastroduodenoscopy (egd) with propofol N/A 08/02/2013    RMR: Portal gastropathy. No explanation for abdominal pain which I believe is more abdominal wall in origin. She has been seen by Dr. Carlis Abbott  over at [facility]. She is up-to-date on cross sectional imaging. She has a pain management physician in [place]. Repeat EGD for varices in 3 years.  . Colonoscopy with propofol N/A 08/02/2013    GYI:RSWNIOE diverticulosis. next TCS 10 years.  . Biliary stone removal  2015  . Lumbar fusion    . Nose surgery  1970   Medications: Reviewed & Updated - see associated section                       Current outpatient prescriptions:  .  albuterol (PROVENTIL) (2.5 MG/3ML) 0.083% nebulizer solution, Take 2.5 mg by nebulization every 6 (six) hours as needed for wheezing or shortness of breath., Disp: , Rfl:  .  atenolol (TENORMIN) 25 MG tablet, Take 25 mg by mouth daily. , Disp: , Rfl:  .  Cholecalciferol (VITAMIN D-3) 1000 UNITS CAPS, Take 1,000 capsules by mouth daily., Disp: , Rfl:  .  cloNIDine (CATAPRES) 0.1 MG tablet, Take 0.1 mg by mouth every 8 (eight) hours as needed (for withdrawal symptoms)., Disp: , Rfl:  .  DULoxetine (CYMBALTA) 60 MG capsule, Take 60 mg by mouth 2 (two) times daily. , Disp: , Rfl:  .  etodolac (LODINE) 400 MG tablet, Take 400 mg by mouth 2 (two) times daily. , Disp: , Rfl:  .  fluticasone (FLONASE) 50 MCG/ACT nasal spray, Place 2 sprays into the nose daily.  , Disp: , Rfl:  .  loratadine (CLARITIN) 10 MG tablet, Take 10 mg by mouth daily., Disp: , Rfl:  .  Omega-3 Fatty Acids (FISH OIL PO), Take 2 capsules by  mouth daily. , Disp: , Rfl:  .  omeprazole (PRILOSEC) 20 MG capsule, TAKE ONE CAPSULE BY MOUTH EVERY DAY, Disp: 30 capsule, Rfl: 11 .  PROAIR HFA 108 (90 BASE) MCG/ACT inhaler, Inhale 2 puffs into the lungs every 6 (six) hours as needed for wheezing or shortness of breath. , Disp: , Rfl:  .  promethazine (PHENERGAN) 25 MG tablet, Take 25 mg by mouth every 6 (six) hours as needed for nausea or vomiting (for nausea for withdrawals)., Disp: , Rfl:  .  EPIPEN 2-PAK 0.3 MG/0.3ML SOAJ injection, Inject 0.3 mg into the skin as needed (allergic reactions). , Disp: , Rfl:  .  Oxycodone HCl 10 MG TABS, Take 10 mg by mouth every 6 (six) hours as needed. pain, Disp: , Rfl:    Social History: Reviewed -  reports that she has been smoking Cigarettes.  She has a 7.5 pack-year smoking history. She has never used smokeless tobacco.  Objective Findings:  Vitals: Blood pressure 130/86, height 5\' 5"  (1.651 m), weight 146 lb (66.225 kg).  Physical Examination: General appearance - alert, well appearing, and in no distress and oriented to person, place, and time Mental status - alert, oriented to person, place, and time, normal mood, behavior, speech, dress, motor activity, and thought processes   Assessment & Plan:   A:  1. Suspect UTI due to malodorous urine and UA results  P:  1. Prescribe Septra ds BID x 7d     This chart was scribed for Jonnie Kind, MD by Tula Nakayama, ED Scribe. This patient was seen in room 1 and the patient's care was started at 11:41 AM.   I personally performed the services described in this documentation, which was SCRIBED in my presence. The recorded information has been reviewed and considered accurate. It has been edited as necessary during review. Jonnie Kind, MD

## 2015-03-03 NOTE — Progress Notes (Signed)
Patient ID: Sabrina Wyatt, female   DOB: 1951/07/09, 64 y.o.   MRN: [id number] Pt here today for burning with urination, and urinary frequency.

## 2015-03-05 DIAGNOSIS — Z79891 Long term (current) use of opiate analgesic: Secondary | ICD-10-CM | POA: Diagnosis not present

## 2015-03-05 DIAGNOSIS — M961 Postlaminectomy syndrome, not elsewhere classified: Secondary | ICD-10-CM | POA: Diagnosis not present

## 2015-03-05 DIAGNOSIS — R109 Unspecified abdominal pain: Secondary | ICD-10-CM | POA: Diagnosis not present

## 2015-03-05 DIAGNOSIS — M5440 Lumbago with sciatica, unspecified side: Secondary | ICD-10-CM | POA: Diagnosis not present

## 2015-03-05 DIAGNOSIS — Z79899 Other long term (current) drug therapy: Secondary | ICD-10-CM | POA: Diagnosis not present

## 2015-03-06 ENCOUNTER — Ambulatory Visit (HOSPITAL_COMMUNITY): Payer: Medicare Other | Admitting: Anesthesiology

## 2015-03-06 ENCOUNTER — Ambulatory Visit (HOSPITAL_COMMUNITY)
Admission: RE | Admit: 2015-03-06 | Discharge: 2015-03-06 | Disposition: A | Discharge status: Home / Self Care | Payer: Medicare Other | Source: Ambulatory Visit | Attending: Internal Medicine | Admitting: Internal Medicine

## 2015-03-06 ENCOUNTER — Encounter (HOSPITAL_COMMUNITY): Payer: Self-pay | Admitting: *Deleted

## 2015-03-06 ENCOUNTER — Encounter (HOSPITAL_COMMUNITY)
Admission: RE | Disposition: A | Discharge status: Home / Self Care | Payer: Self-pay | Source: Ambulatory Visit | Attending: Internal Medicine

## 2015-03-06 DIAGNOSIS — K2950 Unspecified chronic gastritis without bleeding: Secondary | ICD-10-CM | POA: Insufficient documentation

## 2015-03-06 DIAGNOSIS — K3189 Other diseases of stomach and duodenum: Secondary | ICD-10-CM | POA: Insufficient documentation

## 2015-03-06 DIAGNOSIS — F329 Major depressive disorder, single episode, unspecified: Secondary | ICD-10-CM | POA: Diagnosis not present

## 2015-03-06 DIAGNOSIS — Z7982 Long term (current) use of aspirin: Secondary | ICD-10-CM | POA: Diagnosis not present

## 2015-03-06 DIAGNOSIS — F17210 Nicotine dependence, cigarettes, uncomplicated: Secondary | ICD-10-CM | POA: Diagnosis not present

## 2015-03-06 DIAGNOSIS — R1013 Epigastric pain: Secondary | ICD-10-CM | POA: Insufficient documentation

## 2015-03-06 DIAGNOSIS — K319 Disease of stomach and duodenum, unspecified: Secondary | ICD-10-CM | POA: Diagnosis not present

## 2015-03-06 DIAGNOSIS — F4310 Post-traumatic stress disorder, unspecified: Secondary | ICD-10-CM | POA: Insufficient documentation

## 2015-03-06 DIAGNOSIS — M545 Low back pain: Secondary | ICD-10-CM | POA: Insufficient documentation

## 2015-03-06 DIAGNOSIS — B1920 Unspecified viral hepatitis C without hepatic coma: Secondary | ICD-10-CM | POA: Diagnosis not present

## 2015-03-06 DIAGNOSIS — Z79899 Other long term (current) drug therapy: Secondary | ICD-10-CM | POA: Insufficient documentation

## 2015-03-06 DIAGNOSIS — G8929 Other chronic pain: Secondary | ICD-10-CM | POA: Diagnosis not present

## 2015-03-06 DIAGNOSIS — Z9049 Acquired absence of other specified parts of digestive tract: Secondary | ICD-10-CM | POA: Insufficient documentation

## 2015-03-06 DIAGNOSIS — K219 Gastro-esophageal reflux disease without esophagitis: Secondary | ICD-10-CM | POA: Insufficient documentation

## 2015-03-06 DIAGNOSIS — Z86010 Personal history of colonic polyps: Secondary | ICD-10-CM | POA: Diagnosis not present

## 2015-03-06 DIAGNOSIS — I10 Essential (primary) hypertension: Secondary | ICD-10-CM | POA: Insufficient documentation

## 2015-03-06 DIAGNOSIS — K7469 Other cirrhosis of liver: Secondary | ICD-10-CM | POA: Insufficient documentation

## 2015-03-06 HISTORY — PX: ESOPHAGOGASTRODUODENOSCOPY (EGD) WITH PROPOFOL: SHX5813

## 2015-03-06 HISTORY — PX: BIOPSY: SHX5522

## 2015-03-06 SURGERY — ESOPHAGOGASTRODUODENOSCOPY (EGD) WITH PROPOFOL
Anesthesia: Monitor Anesthesia Care

## 2015-03-06 MED ORDER — LACTATED RINGERS IV SOLN
INTRAVENOUS | Status: DC
  Administered 2015-03-06: 11:00:00 via INTRAVENOUS

## 2015-03-06 MED ORDER — STERILE WATER FOR IRRIGATION IR SOLN
Status: DC | PRN
  Administered 2015-03-06: 11:00:00

## 2015-03-06 MED ORDER — FENTANYL CITRATE (PF) 100 MCG/2ML IJ SOLN
INTRAMUSCULAR | Status: AC
  Filled 2015-03-06: qty 2

## 2015-03-06 MED ORDER — PROPOFOL INFUSION 10 MG/ML OPTIME
INTRAVENOUS | Status: DC | PRN
  Administered 2015-03-06: 125 ug/kg/min via INTRAVENOUS

## 2015-03-06 MED ORDER — MIDAZOLAM HCL 2 MG/2ML IJ SOLN
INTRAMUSCULAR | Status: AC
Start: 2015-03-06 — End: 2015-03-06
  Filled 2015-03-06: qty 2

## 2015-03-06 MED ORDER — FENTANYL CITRATE (PF) 100 MCG/2ML IJ SOLN: 25.0000 ug | INTRAMUSCULAR | Status: DC | PRN

## 2015-03-06 MED ORDER — PROPOFOL 10 MG/ML IV BOLUS
INTRAVENOUS | Status: DC | PRN
  Administered 2015-03-06: 10 mg via INTRAVENOUS
  Administered 2015-03-06: 20 mg via INTRAVENOUS

## 2015-03-06 MED ORDER — LIDOCAINE VISCOUS 2 % MT SOLN
15.0000 mL | Freq: Two times a day (BID) | OROMUCOSAL | Status: DC
  Administered 2015-03-06 (×2): 15 mL via OROMUCOSAL
  Filled 2015-03-06: qty 15

## 2015-03-06 MED ORDER — FENTANYL CITRATE (PF) 100 MCG/2ML IJ SOLN
25.0000 ug | INTRAMUSCULAR | Status: AC
  Administered 2015-03-06 (×2): 25 ug via INTRAVENOUS

## 2015-03-06 MED ORDER — ONDANSETRON HCL 4 MG/2ML IJ SOLN: 4.0000 mg | Freq: Once | INTRAMUSCULAR | Status: DC | PRN

## 2015-03-06 MED ORDER — MIDAZOLAM HCL 2 MG/2ML IJ SOLN
1.0000 mg | INTRAMUSCULAR | Status: DC | PRN
Start: 2015-03-06 — End: 2015-03-06
  Administered 2015-03-06: 2 mg via INTRAVENOUS

## 2015-03-06 MED ORDER — MIDAZOLAM HCL 2 MG/2ML IJ SOLN
INTRAMUSCULAR | Status: AC
  Filled 2015-03-06: qty 2

## 2015-03-06 MED ORDER — ONDANSETRON HCL 4 MG/2ML IJ SOLN
INTRAMUSCULAR | Status: AC
  Filled 2015-03-06: qty 2

## 2015-03-06 MED ORDER — MIDAZOLAM HCL 5 MG/5ML IJ SOLN
INTRAMUSCULAR | Status: DC | PRN
  Administered 2015-03-06: 2 mg via INTRAVENOUS

## 2015-03-06 MED ORDER — ONDANSETRON HCL 4 MG/2ML IJ SOLN
4.0000 mg | Freq: Once | INTRAMUSCULAR | Status: AC
  Administered 2015-03-06: 4 mg via INTRAVENOUS

## 2015-03-06 SURGICAL SUPPLY — 18 items
BLOCK BITE 60FR ADLT L/F BLUE (MISCELLANEOUS) ×1 IMPLANT
DEVICE CLIP HEMOSTAT 235CM (CLIP) IMPLANT
ELECT REM PT RETURN 9FT ADLT (ELECTROSURGICAL)
ELECTRODE REM PT RTRN 9FT ADLT (ELECTROSURGICAL) IMPLANT
FORCEPS BIOP RAD 4 LRG CAP 4 (CUTTING FORCEPS) ×1 IMPLANT
FORMALIN 10 PREFIL 20ML (MISCELLANEOUS) ×1 IMPLANT
KIT ENDO PROCEDURE PEN (KITS) ×2 IMPLANT
MANIFOLD NEPTUNE II (INSTRUMENTS) ×1 IMPLANT
NDL SCLEROTHERAPY 25GX240 (NEEDLE) IMPLANT
NEEDLE SCLEROTHERAPY 25GX240 (NEEDLE) IMPLANT
PROBE APC STR FIRE (PROBE) IMPLANT
PROBE INJECTION GOLD (MISCELLANEOUS)
PROBE INJECTION GOLD 7FR (MISCELLANEOUS) IMPLANT
SNARE ROTATE MED OVAL 20MM (MISCELLANEOUS) IMPLANT
SNARE SHORT THROW 13M SML OVAL (MISCELLANEOUS) ×1 IMPLANT
SYR INFLATION 60ML (SYRINGE) ×1 IMPLANT
TUBING IRRIGATION ENDOGATOR (MISCELLANEOUS) ×1 IMPLANT
WATER STERILE IRR 1000ML POUR (IV SOLUTION) ×1 IMPLANT

## 2015-03-06 NOTE — Progress Notes (Signed)
Quick Note:  Please let patient know her CBC and lipase are fine. Her leukocytosis resolved. EGD as planned. ______

## 2015-03-06 NOTE — Anesthesia Postprocedure Evaluation (Signed)
  Anesthesia Post-op Note  Patient: Sabrina Wyatt  Procedure(s) Performed: Procedure(s): ESOPHAGOGASTRODUODENOSCOPY (EGD) WITH PROPOFOL (N/A) BIOPSY (N/A)  Patient Location: PACU  Anesthesia Type:MAC  Level of Consciousness: awake, alert  and oriented  Airway and Oxygen Therapy: Patient Spontanous Breathing  Post-op Pain: none  Post-op Assessment: Post-op Vital signs reviewed, Patient's Cardiovascular Status Stable, Respiratory Function Stable, Patent Airway and No signs of Nausea or vomiting              Post-op Vital Signs: Reviewed and stable  Last Vitals:  Filed Vitals:   03/06/15 1100  BP: 104/65  Pulse:   Temp:   Resp: 15    Complications: No apparent anesthesia complications

## 2015-03-06 NOTE — H&P (View-Only) (Signed)
Primary Care Physician: Rosita Fire, MD  Primary Gastroenterologist:  Garfield Cornea, MD   Chief Complaint  Patient presents with  . Follow-up    HPI: Sabrina Wyatt is a 64 y.o. female here follow-up. Last seen in December 2015. 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 History of remote bile duct injury at time of cholecystectomy. Required percutaneous intervention of left hepatic duct stricture with upstream obstructing stone requiring lithotripsy at [facility] in the most recent past. Chronic abdominal pain followed at pain clinic (Dr. Lovenia Shuck). 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 Surgery with Dr. Bailey Mech at [facility] in 05/2012. She had exploratory laparotomy with resection of jejunostomy, and end-to-end 2 layer handsewn jejunostomy, lysis of adhesions. No evidence of small bowel tumor. History of pulmonary nodules with repeat CT of the chest recommended, which was done in 05/2014 and no further evaluation needed.  Patient seen in follow-up back in April with complaints of epigastric discomfort, at the time felt like "[place] burn". Given her history of hepatic duct stricture in the past repeated labs which showed normal LFTs. Abdominal ultrasound with no biliary dilation. Increased her omeprazole to twice a day for couple of weeks. She states it helped only a little and she went back to once daily dosing. Epigastric discomfort worsened last 2-3 weeks. Constant. Unrelated to meals. Complains of a lot of nausea but no vomiting. Blamed a lot of her symptoms on recent move and the heat.    BMs okay. Denies blood in the stool or melena. Weight down at least 10 pounds. Feels like eating well. Eating pretty healthy. More active than usual.   Continues Lodine twice a day.   Current Outpatient Prescriptions  Medication Sig Dispense Refill  . acyclovir (ZOVIRAX) 400 MG tablet Take 1 tablet (400 mg total) by mouth  2 (two) times daily. Take twice daily for supression during last weeks of pregnancy 60 tablet 11  . albuterol (PROVENTIL) (2.5 MG/3ML) 0.083% nebulizer solution Take 2.5 mg by nebulization every 6 (six) hours as needed for wheezing or shortness of breath.    Marland Kitchen atenolol (TENORMIN) 25 MG tablet Take 25 mg by mouth daily.     . Cholecalciferol (VITAMIN D-3) 1000 UNITS CAPS Take 1,000 capsules by mouth daily.    . DULoxetine (CYMBALTA) 60 MG capsule Take 60 mg by mouth 2 (two) times daily.     Marland Kitchen EPIPEN 2-PAK 0.3 MG/0.3ML SOAJ injection     . etodolac (LODINE) 400 MG tablet Take 400 mg by mouth 2 (two) times daily.     . fluticasone (FLONASE) 50 MCG/ACT nasal spray Place 2 sprays into the nose daily.      Marland Kitchen lidocaine (LIDODERM) 5 % Place 1 patch onto the skin daily. Remove & Discard patch within 12 hours or as directed by MD    . loratadine (CLARITIN) 10 MG tablet Take 10 mg by mouth daily.    Marland Kitchen nystatin-triamcinolone ointment (MYCOLOG) Apply 1 application topically 2 (two) times daily. To affected area. 30 g prn  . Omega-3 Fatty Acids (FISH OIL PO) Take 300-1,000 capsules by mouth daily. Take 2 daily    . omeprazole (PRILOSEC) 20 MG capsule TAKE ONE CAPSULE BY MOUTH EVERY DAY 30 capsule 11  . Oxycodone HCl 10 MG TABS 10 mg every 6 hours    . PROAIR HFA 108 (90 BASE) MCG/ACT inhaler Inhale 2 puffs into the lungs every 6 (six) hours as needed.  No current facility-administered medications for this visit.    Allergies as of 02/17/2015  . (No Known Allergies)   Past Medical History  Diagnosis Date  . GERD (gastroesophageal reflux disease)   . Cirrhosis, hepatitis C     CT on 03/26/13= no HCC, pt has been vaccinated for Hep A and Hep B. s/p treatment of HCV with eradication  . Depression   . Carpal tunnel syndrome   . S/P endoscopy 08/27/10    antral erosions, otherwise normal, due egd 08/2012 to screen for varices  . Abdominal wall pain     chronic  . Pulmonary nodules   . PTSD  (post-traumatic stress disorder)   . Chronic low back pain   . 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 Polysubstance abuse     HX of  . S/P colonoscopy 2005    Dr Moss Mc polyp removed, otherwise normal  . Hypertension   . Tobacco abuse   . Intussusception 04/2012  . Shortness of breath   . Seizures 1998    with brain bleed x1. None since then and on no meds  . Chronic abdominal pain   . Biliary stone    Past Surgical History  Procedure Laterality Date  . Cholecystectomy  2002  . Percutaneous transhepatic cholangiograhpy and billiary drainage  2002  . Roux-en-y-hepatojejunostomy  2003    revision in 2013 for intussusception [facility] Dr. Bailey Mech)  . Spinal fusion      C5-C7  . Carpal tunnel release Bilateral 12/2010  . Esophagogastroduodenoscopy    09/10/2003    Small hiatal hernia; otherwise normal stomach, normal D1 and D2  . Colonoscopy    09/10/2003    diminutive polyp in the rectum cold biopsied/removed/ Normal colon  . Esophagogastroduodenoscopy  08/27/2010    BTD:HRCBUL esophagus.  No varices.  Couple of tiny antral erosions of doubtful clinical significance, otherwise normal stomach, D1 and D2.  . Esophagogastroduodenoscopy (egd) with propofol N/A 08/02/2013    RMR: Portal gastropathy. No explanation for abdominal pain which I believe is more abdominal wall in origin. She has been seen by Dr. Carlis Abbott  over at [facility]. She is up-to-date on cross sectional imaging. She has a pain management physician in [place]. Repeat EGD for varices in 3 years.  . Colonoscopy with propofol N/A 08/02/2013    AGT:XMIWOEH diverticulosis. next TCS 10 years.  . Biliary stone removal  2015   Family History  Problem Relation Age of Onset  . Coronary artery disease Brother   . Cancer Sister     lymphoma  . Cancer Father     brain   History   Social History  . Marital Status: Single    Spouse Name: N/A  . Number of Children: 1  . Years of Education: N/A   Occupational  History  . disabled    Social History Main Topics  . Smoking status: Current Some Day Smoker -- 0.25 packs/day for 30 years    Types: Cigarettes  . Smokeless tobacco: Never Used     Comment: One pack every 6-7 days  . Alcohol Use: Yes     Comment: social   . Drug Use: No     Comment: remote in teens-marijuana, heroin, cocaine  . Sexual Activity: No   Other Topics Concern  . None   Social History Narrative         ROS:  General: Negative for anorexia,   fever, chills, fatigue, weakness. See hpi Eyes: Negative for  vision changes.  ENT: Negative for hoarseness, difficulty swallowing , nasal congestion. CV: Negative for chest pain, angina, palpitations, dyspnea on exertion, peripheral edema.  Respiratory: Negative for dyspnea at rest, dyspnea on exertion, cough, sputum, wheezing.  GI: See history of present illness. GU:  Negative for dysuria, hematuria, urinary incontinence, urinary frequency, nocturnal urination.  MS: Negative for joint pain, low back pain.  Derm: Negative for rash or itching.  Neuro: Negative for weakness, abnormal sensation, seizure, frequent headaches, memory loss, confusion.  Psych: Negative for anxiety, depression, suicidal ideation, hallucinations.  Endo: see hpi Heme: Negative for bruising or bleeding. Allergy: Negative for rash or hives.   Physical Examination:   BP 129/82 mmHg  Pulse 92  Temp(Src) 97.6 F (36.4 C) (Oral)  Ht 5\' 5"  (1.651 m)  Wt 150 lb 9.6 oz (68.312 kg)  BMI 25.06 kg/m2  Physical Examination:   General: Well-nourished, well-developed in no acute distress.  Head: Normocephalic, atraumatic.   Eyes: Conjunctiva pink, no icterus. Mouth: Oropharyngeal mucosa moist and pink , no lesions erythema or exudate. Neck: Supple without thyromegaly, masses, or lymphadenopathy.  Lungs: Clear to auscultation bilaterally.  Heart: Regular rate and rhythm, no murmurs rubs or gallops.  Abdomen: Bowel sounds are normal, moderate epigastric  tenderness, nondistended, no hepatosplenomegaly or masses, no abdominal bruits or    hernia , no rebound or guarding.  Multiple well-healed incisdions Extremities: No lower extremity edema.  Neuro: Alert and oriented x 4 , grossly normal neurologically.  Skin: Warm and dry, no rash or jaundice.   Psych: Alert and cooperative, normal mood and affect.    Labs:  Lab Results  Component Value Date   WBC 11.1* 01/31/2015   HGB 13.9 01/31/2015   HCT 42.2 01/31/2015   MCV 88.8 01/31/2015   PLT 203 01/31/2015   Lab Results  Component Value Date   CREATININE 0.74 01/31/2015   BUN 12 01/31/2015   NA 140 01/31/2015   K 4.1 01/31/2015   CL 104 01/31/2015   CO2 23 01/31/2015   Lab Results  Component Value Date   ALT 15 01/31/2015   AST 19 01/31/2015   ALKPHOS 101 01/31/2015   BILITOT 0.7 01/31/2015   Lab Results  Component Value Date   INR 1.05 01/31/2015   INR 1.00 02/14/2013   INR 1.00 05/20/2011     Imaging Studies: No results found.

## 2015-03-06 NOTE — Interval H&P Note (Signed)
History and Physical Interval Note:  03/06/2015 10:01 AM  Sabrina Wyatt  has presented today for surgery, with the diagnosis of epigastric pain, nausea, weight loss  The various methods of treatment have been discussed with the patient and family. After consideration of risks, benefits and other options for treatment, the patient has consented to  Procedure(s) with comments: ESOPHAGOGASTRODUODENOSCOPY (EGD) WITH PROPOFOL (N/A) - 1100 as a surgical intervention .  The patient's history has been reviewed, patient examined, no change in status, stable for surgery.  I have reviewed the patient's chart and labs.  Questions were answered to the patient's satisfaction.     Sabrina Wyatt  No change. Diagnostic EGD per plan.  The risks, benefits, limitations, alternatives and imponderables have been reviewed with the patient. Potential for esophageal dilation, biopsy, etc. have also been reviewed.  Questions have been answered. All parties agreeable.

## 2015-03-06 NOTE — Op Note (Signed)
[facility] [address] [place], [address]   ENDOSCOPY PROCEDURE REPORT  PATIENT: Sheneika, Walstad  MR#: [id number] BIRTHDATE: 1951-05-31 , 73  yrs. old GENDER: female ENDOSCOPIST: R.  Garfield Cornea, MD FACP FACG REFERRED BY:  Conni Slipper, M.D. PROCEDURE DATE:  03/15/2015 PROCEDURE:  EGD w/ biopsy INDICATIONS:  epigastric pain. MEDICATIONS: Deep sedation per Dr.  [facility] ASA CLASS:      Class III  CONSENT: The risks, benefits, limitations, alternatives and imponderables have been discussed.  The potential for biopsy, esophogeal dilation, etc. have also been reviewed.  Questions have been answered.  All parties agreeable.  Please see the history and physical in the medical record for more information.  DESCRIPTION OF PROCEDURE: After the risks benefits and alternatives of the procedure were thoroughly explained, informed consent was obtained.  The    endoscope was introduced through the mouth and advanced to the second portion of the duodenum , limited by Without limitations. The instrument was slowly withdrawn as the mucosa was fully examined. Estimated blood loss is zero unless otherwise noted in this procedure report.    Normal-appearing tubular esophagus.  Stomach empty.  Diffuse snake skinning or fish scale appearance of uncertain significance.  No ulcer or infiltrating process.  No varices seen anywhere.  Patent pylorus.  Normal-appearing first and second portion of the duodenum  Biopsies of the abnormal gastric mucosa taken for histologic study. Retroflexed views revealed no abnormalities.     The scope was then withdrawn from the patient and the procedure completed. EBL 3 mL COMPLICATIONS: There were no immediate complications.  ENDOSCOPIC IMPRESSION: Abnormal gastric mucosa?"may represent portal gastropathy/nonspecific/likely -   does not explain her abdominal pain?"status post biopsy  RECOMMENDATIONS: Follow-up on  pathology. Further evaluation of abdominal pain may be needed.  REPEAT EXAM:  eSigned:  R. Garfield Cornea, MD Rosalita Chessman [facility] 03/15/15 11:42 AM    CC:  CPT CODES: ICD CODES:  The ICD and CPT codes recommended by this software are interpretations from the data that the clinical staff has captured with the software.  The verification of the translation of this report to the ICD and CPT codes and modifiers is the sole responsibility of the health care institution and practicing physician where this report was generated.  [place]. will not be held responsible for the validity of the ICD and CPT codes included on this report.  AMA assumes no liability for data contained or not contained herein. CPT is a Designer, television/film set of the [employer].  PATIENT NAME:  Tomica, Arseneault MR#: [id number]

## 2015-03-06 NOTE — Anesthesia Preprocedure Evaluation (Signed)
Anesthesia Evaluation  Patient identified by MRN, date of birth, ID band Patient awake    Reviewed: Allergy & Precautions, H&P , NPO status , Patient's Chart, lab work & pertinent test results, reviewed documented beta blocker date and time   Airway Mallampati: II  TM Distance: <3 FB Neck ROM: Full    Dental  (+) Edentulous Upper   Pulmonary shortness of breath, Current Smoker,  breath sounds clear to auscultation        Cardiovascular hypertension, Pt. on medications and Pt. on home beta blockers Rhythm:Regular Rate:Normal     Neuro/Psych Seizures -,  PSYCHIATRIC DISORDERS (PTSD) Depression  Neuromuscular disease    GI/Hepatic GERD-  Medicated,(+) Cirrhosis -    substance abuse  alcohol use, Hepatitis -, C  Endo/Other    Renal/GU      Musculoskeletal   Abdominal   Peds  Hematology   Anesthesia Other Findings   Reproductive/Obstetrics                             Anesthesia Physical Anesthesia Plan  ASA: III  Anesthesia Plan: MAC   Post-op Pain Management:    Induction: Intravenous  Airway Management Planned: Simple Face Mask  Additional Equipment:   Intra-op Plan:   Post-operative Plan:   Informed Consent: I have reviewed the patients History and Physical, chart, labs and discussed the procedure including the risks, benefits and alternatives for the proposed anesthesia with the patient or authorized representative who has indicated his/her understanding and acceptance.     Plan Discussed with:   Anesthesia Plan Comments:         Anesthesia Quick Evaluation

## 2015-03-06 NOTE — Discharge Instructions (Signed)
EGD Discharge instructions Please read the instructions outlined below and refer to this sheet in the next few weeks. These discharge instructions provide you with general information on caring for yourself after you leave the hospital. Your doctor may also give you specific instructions. While your treatment has been planned according to the most current medical practices available, unavoidable complications occasionally occur. If you have any problems or questions after discharge, please call your doctor. ACTIVITY  You may resume your regular activity but move at a slower pace for the next 24 hours.   Take frequent rest periods for the next 24 hours.   Walking will help expel (get rid of) the air and reduce the bloated feeling in your abdomen.   No driving for 24 hours (because of the anesthesia (medicine) used during the test).   You may shower.   Do not sign any important legal documents or operate any machinery for 24 hours (because of the anesthesia used during the test).  NUTRITION  Drink plenty of fluids.   You may resume your normal diet.   Begin with a light meal and progress to your normal diet.   Avoid alcoholic beverages for 24 hours or as instructed by your caregiver.  MEDICATIONS  You may resume your normal medications unless your caregiver tells you otherwise.  WHAT YOU CAN EXPECT TODAY  You may experience abdominal discomfort such as a feeling of fullness or gas pains.  FOLLOW-UP  Your doctor will discuss the results of your test with you.  SEEK IMMEDIATE MEDICAL ATTENTION IF ANY OF THE FOLLOWING OCCUR:  Excessive nausea (feeling sick to your stomach) and/or vomiting.   Severe abdominal pain and distention (swelling).   Trouble swallowing.   Temperature over 101 F (37.8 C).   Rectal bleeding or vomiting of blood.     Your stomach lining was abnormal. It was biopsied. However, it may not have anything to do with your abdominal pain. Further  recommendations to follow pending review of pathology report

## 2015-03-06 NOTE — Transfer of Care (Signed)
Immediate Anesthesia Transfer of Care Note  Patient: Sabrina Wyatt  Procedure(s) Performed: Procedure(s): ESOPHAGOGASTRODUODENOSCOPY (EGD) WITH PROPOFOL (N/A) BIOPSY (N/A)  Patient Location: PACU  Anesthesia Type:MAC  Level of Consciousness: awake, alert  and oriented  Airway & Oxygen Therapy: Patient Spontanous Breathing  Post-op Assessment: Report given to RN  Post vital signs: Reviewed and stable  Last Vitals:  Filed Vitals:   03/06/15 1100  BP: 104/65  Pulse:   Temp:   Resp: 15    Complications: No apparent anesthesia complications

## 2015-03-07 ENCOUNTER — Encounter (HOSPITAL_COMMUNITY): Payer: Self-pay | Admitting: Internal Medicine

## 2015-03-19 ENCOUNTER — Ambulatory Visit: Payer: Medicare Other | Attending: Anesthesiology | Admitting: Physical Therapy

## 2015-03-19 DIAGNOSIS — M545 Low back pain, unspecified: Secondary | ICD-10-CM

## 2015-03-19 NOTE — Therapy (Signed)
[facility] [place], [place], [address] Phone: [phone]   Fax:  [phone]  Physical Therapy Evaluation  Patient Details  Name: Sabrina Wyatt MRN: [id number] Date of Birth: 1950-08-31 Referring Provider:  Clydell Hakim, MD  Encounter Date: 03/19/2015    Past Medical History  Diagnosis Date  . GERD (gastroesophageal reflux disease)   . Cirrhosis, hepatitis C     CT on 03/26/13= no HCC, pt has been vaccinated for Hep A and Hep B. s/p treatment of HCV with eradication  . Depression   . Carpal tunnel syndrome   . S/P endoscopy 08/27/10    antral erosions, otherwise normal, due egd 08/2012 to screen for varices  . Abdominal wall pain     chronic  . Pulmonary nodules   . PTSD (post-traumatic stress disorder)   . Chronic low back pain   . 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 Polysubstance abuse     HX of  . S/P colonoscopy 2005    Dr Moss Mc polyp removed, otherwise normal  . Hypertension   . Tobacco abuse   . Intussusception 04/2012  . Shortness of breath   . Chronic abdominal pain   . Biliary stone   . Seizures 1998    with brain bleed x1. None since then and on no meds    Past Surgical History  Procedure Laterality Date  . Cholecystectomy  2002  . Percutaneous transhepatic cholangiograhpy and billiary drainage  2002  . Roux-en-y-hepatojejunostomy  2003    revision in 2013 for intussusception [facility] Dr. Bailey Mech)  . Spinal fusion      C5-C7  . Carpal tunnel release Bilateral 12/2010  . Esophagogastroduodenoscopy    09/10/2003    Small hiatal hernia; otherwise normal stomach, normal D1 and D2  . Colonoscopy    09/10/2003    diminutive polyp in the rectum cold biopsied/removed/ Normal colon  . Esophagogastroduodenoscopy  08/27/2010    RAQ:TMAUQJ esophagus.  No varices.  Couple of tiny antral erosions of doubtful clinical significance, otherwise normal stomach, D1 and D2.  .  Esophagogastroduodenoscopy (egd) with propofol N/A 08/02/2013    RMR: Portal gastropathy. No explanation for abdominal pain which I believe is more abdominal wall in origin. She has been seen by Dr. Carlis Abbott  over at [facility]. She is up-to-date on cross sectional imaging. She has a pain management physician in [place]. Repeat EGD for varices in 3 years.  . Colonoscopy with propofol N/A 08/02/2013    FHL:[id number] diverticulosis. next TCS 10 years.  . Biliary stone removal  2015  . Lumbar fusion    . Nose surgery  1970  . Esophagogastroduodenoscopy (egd) with propofol N/A 03/06/2015    Procedure: ESOPHAGOGASTRODUODENOSCOPY (EGD) WITH PROPOFOL;  Surgeon: Daneil Dolin, MD;  Location: [facility];  Service: Endoscopy;  Laterality: N/A;  . Esophageal biopsy N/A 03/06/2015    Procedure: BIOPSY;  Surgeon: Daneil Dolin, MD;  Location: [facility];  Service: Endoscopy;  Laterality: N/A;    There were no vitals filed for this visit.  Visit Diagnosis:  Midline low back pain without sciatica - Plan: PT plan of care cert/re-cert      Subjective Assessment - 03/19/15 1251    Subjective Been without pain medication and pain is around a 9-10/10.   Limitations Sitting;Standing;Walking   How long can you sit comfortably? 15 minutes.   How long can you stand comfortably? 15 minutes.   How long can you walk comfortably?  15 minutes.   Patient Stated Goals Decrease pain.   Pain Score 10-Worst pain ever   Pain Location Back   Pain Orientation Mid;Lower   Pain Descriptors / Indicators Aching;Throbbing   Pain Type Chronic pain            [facility] PT Assessment - 03/19/15 0001    Assessment   Medical Diagnosis Back pain.   Onset Date/Surgical Date --  2002.   Precautions   Precautions None   Restrictions   Weight Bearing Restrictions No   Balance Screen   Has the patient fallen in the past 6 months No   Has the patient had a decrease in activity level because of a fear of falling?  No   Is the patient  reluctant to leave their home because of a fear of falling?  No   Home Ecologist residence   Prior Function   Level of Independence Independent   Cognition   Overall Cognitive Status Within Functional Limits for tasks assessed   Posture/Postural Control   Posture Comments Anterior pelvic tilting anfd notable abdominal distention.   ROM / Strength   AROM / PROM / Strength AROM;Strength   AROM   Overall AROM Comments Functional spinal range of motionand WNL for bilateral LE's.   Strength   Overall Strength Comments Normal bilateral LE strength.   Palpation   Palpation comment Tender with overpressure at L5-S1 and bilateral SIJ's.  The patient has has a great deal of muscle guarding in her lumbar erector spiane musculature left > right.   Special Tests    Special Tests Lumbar;Sacrolliac Tests;Leg LengthTest  Hyporeflexive bil LE DTR's.   Lumbar Tests --  Negative SLR testing.   Sacroiliac Tests  --  Negative Faber testing.   Leg length test  --  Equal leg lengths.                   [facility] Adult PT Treatment/Exercise - 03/19/15 0001    Manual Therapy   Manual therapy comments Prone over folded pillow:  STW/M x 12 minutes to lower lumbar reion.                  PT Short Term Goals - 03/19/15 1310    PT SHORT TERM GOAL #1   Title Ind with HEP.   Time 2   Period Weeks   Status New           PT Long Term Goals - 03/19/15 1311    PT LONG TERM GOAL #1   Title Sit 30 minutes with pain not > 4/10.   Time 4   Period Weeks   Status New   PT LONG TERM GOAL #2   Title Stand 30 minutes with pain not > 4/10.   Time 4   Period Weeks   Status New   PT LONG TERM GOAL #3   Title Perform ADL's with pain not > 4/10.   Time 4   Period Weeks   Status New               Plan - 03/19/15 1306    Clinical Impression Statement The patient has a long h/o low back pain.  She states she has been off her pain medication and her  pain-level has been around a 9-10/10.  She states she cannot perform her ADL's due to this.  TENS and massage decrease her pain.  She also finds stretching helps also.  Pt will benefit from skilled therapeutic intervention in order to improve on the following deficits Pain;Decreased activity tolerance   Rehab Potential Fair   PT Frequency 2x / week   PT Duration 6 weeks   PT Treatment/Interventions ADLs/Self Care Home Management;Electrical Stimulation;Manual techniques;Therapeutic exercise;Ultrasound   PT Next Visit Plan STW/M to lumbar and lumbo-sacral region.  Core exercise as tolerated.          G-Codes - 04-14-15 1312    Functional Assessment Tool Used FOO.   Functional Limitation Mobility: Walking and moving around   Mobility: Walking and Moving Around Current Status [phone]) At least 60 percent but less than 80 percent impaired, limited or restricted   Mobility: Walking and Moving Around Goal Status [phone]) At least 40 percent but less than 60 percent impaired, limited or restricted       Problem List Patient Active Problem List   Diagnosis Date Noted  . Mucosal abnormality of stomach   . Nausea without vomiting 02/17/2015  . Loss of weight 02/17/2015  . Chronic folliculitis 73/22/0254  . 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 Insomnia 03/12/2014  . Folliculitis 27/01/2375  . Cirrhosis of liver 11/15/2013  . Lower extremity edema 11/15/2013  . Abdominal pain, chronic, epigastric 07/10/2013  . Anorexia nervosa 07/10/2013  . 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 Muscle weakness (generalized) 12/28/2011  . CARPAL TUNNEL SYNDROME 10/16/2008  . ABDOMINAL BLOATING 10/16/2008  . Alcohol abuse 04/25/2008  . TOBACCO ABUSE 04/25/2008  . POST TRAUMATIC STRESS SYNDROME 04/22/2008  . DEPRESSION 04/22/2008  . INTRACRANIAL HEMORRHAGE 04/22/2008  . Esophageal reflux 04/22/2008   . INTUSSUSCEPTION 04/22/2008  . ABDOMINAL ADHESIONS 04/22/2008  . Hepatic cirrhosis due to chronic hepatitis C infection 04/22/2008  . OBSTRUCTION OF BILE DUCT 04/22/2008  . LOW BACK PAIN, CHRONIC 04/22/2008  . Abdominal pain 04/22/2008  . ABDOMINAL PAIN OTHER SPECIFIED SITE 04/22/2008  . HEPATITIS C, HX OF 04/22/2008    Kasondra Junod, [place] MPT April 14, 2015, 1:15 PM  [facility] [address] [place], [place], [address] Phone: [phone]   Fax:  [phone]

## 2015-03-21 DIAGNOSIS — F321 Major depressive disorder, single episode, moderate: Secondary | ICD-10-CM | POA: Diagnosis not present

## 2015-03-21 DIAGNOSIS — I10 Essential (primary) hypertension: Secondary | ICD-10-CM | POA: Diagnosis not present

## 2015-03-21 DIAGNOSIS — K769 Liver disease, unspecified: Secondary | ICD-10-CM | POA: Diagnosis not present

## 2015-03-21 DIAGNOSIS — J449 Chronic obstructive pulmonary disease, unspecified: Secondary | ICD-10-CM | POA: Diagnosis not present

## 2015-03-25 ENCOUNTER — Ambulatory Visit: Payer: Medicare Other | Admitting: Physical Therapy

## 2015-03-28 ENCOUNTER — Ambulatory Visit: Payer: Medicare Other | Admitting: Physical Therapy

## 2015-03-31 ENCOUNTER — Encounter: Payer: Self-pay | Admitting: Physical Therapy

## 2015-03-31 DIAGNOSIS — K561 Intussusception: Secondary | ICD-10-CM | POA: Diagnosis not present

## 2015-03-31 DIAGNOSIS — S3613XD Injury of bile duct, subsequent encounter: Secondary | ICD-10-CM | POA: Diagnosis not present

## 2015-03-31 DIAGNOSIS — G8929 Other chronic pain: Secondary | ICD-10-CM | POA: Diagnosis not present

## 2015-03-31 DIAGNOSIS — B182 Chronic viral hepatitis C: Secondary | ICD-10-CM | POA: Diagnosis not present

## 2015-04-02 ENCOUNTER — Encounter: Payer: Self-pay | Admitting: Physical Therapy

## 2015-04-04 ENCOUNTER — Encounter: Payer: Self-pay | Admitting: Physical Therapy

## 2015-04-09 ENCOUNTER — Ambulatory Visit: Payer: Medicare Other | Attending: Anesthesiology | Admitting: Physical Therapy

## 2015-04-09 DIAGNOSIS — M545 Low back pain, unspecified: Secondary | ICD-10-CM

## 2015-04-09 NOTE — Therapy (Signed)
[facility] [place], [place], [address] Phone: [phone]   Fax:  [phone]  Physical Therapy Treatment  Patient Details  Name: Sabrina Wyatt MRN: [id number] Date of Birth: 1951/04/25 Referring Provider:  Rosita Fire, MD  Encounter Date: 04/09/2015      PT End of Session - 04/09/15 1005    Visit Number 2   PT Start Time 5427   PT Stop Time 0623   PT Time Calculation (min) 39 min   Activity Tolerance Patient tolerated treatment well   Behavior During Therapy [facility] for tasks assessed/performed      Past Medical History  Diagnosis Date  . GERD (gastroesophageal reflux disease)   . Cirrhosis, hepatitis C     CT on 03/26/13= no HCC, pt has been vaccinated for Hep A and Hep B. s/p treatment of HCV with eradication  . Depression   . Carpal tunnel syndrome   . S/P endoscopy 08/27/10    antral erosions, otherwise normal, due egd 08/2012 to screen for varices  . Abdominal wall pain     chronic  . Pulmonary nodules   . PTSD (post-traumatic stress disorder)   . Chronic low back pain   . 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 Polysubstance abuse     HX of  . S/P colonoscopy 2005    Dr Moss Mc polyp removed, otherwise normal  . Hypertension   . Tobacco abuse   . Intussusception 04/2012  . Shortness of breath   . Chronic abdominal pain   . Biliary stone   . Seizures 1998    with brain bleed x1. None since then and on no meds    Past Surgical History  Procedure Laterality Date  . Cholecystectomy  2002  . Percutaneous transhepatic cholangiograhpy and billiary drainage  2002  . Roux-en-y-hepatojejunostomy  2003    revision in 2013 for intussusception [facility] Dr. Bailey Mech)  . Spinal fusion      C5-C7  . Carpal tunnel release Bilateral 12/2010  . Esophagogastroduodenoscopy    09/10/2003    Small hiatal hernia; otherwise normal stomach, normal D1 and D2  . Colonoscopy    09/10/2003    diminutive polyp  in the rectum cold biopsied/removed/ Normal colon  . Esophagogastroduodenoscopy  08/27/2010    JSE:GBTDVV esophagus.  No varices.  Couple of tiny antral erosions of doubtful clinical significance, otherwise normal stomach, D1 and D2.  . Esophagogastroduodenoscopy (egd) with propofol N/A 08/02/2013    RMR: Portal gastropathy. No explanation for abdominal pain which I believe is more abdominal wall in origin. She has been seen by Dr. Carlis Abbott  over at [facility]. She is up-to-date on cross sectional imaging. She has a pain management physician in [place]. Repeat EGD for varices in 3 years.  . Colonoscopy with propofol N/A 08/02/2013    OHY:WVPXTGG diverticulosis. next TCS 10 years.  . Biliary stone removal  2015  . Lumbar fusion    . Nose surgery  1970  . Esophagogastroduodenoscopy (egd) with propofol N/A 03/06/2015    Procedure: ESOPHAGOGASTRODUODENOSCOPY (EGD) WITH PROPOFOL;  Surgeon: Daneil Dolin, MD;  Location: [facility];  Service: Endoscopy;  Laterality: N/A;  . Esophageal biopsy N/A 03/06/2015    Procedure: BIOPSY;  Surgeon: Daneil Dolin, MD;  Location: [facility];  Service: Endoscopy;  Laterality: N/A;    There were no vitals filed for this visit.  Visit Diagnosis:  Midline low back pain without sciatica      Subjective  Assessment - 04/09/15 1005    Subjective "I got out of bed so that's a good thing"   Currently in Pain? Yes   Pain Score 7    Pain Location Back   Pain Orientation Mid;Lower   Pain Descriptors / Indicators Aching;Throbbing   Pain Type Chronic pain                         [facility] Adult PT Treatment/Exercise - 04/09/15 0001    Exercises   Exercises Lumbar   Lumbar Exercises: Stretches   Quadruped Mid Back Stretch 3 reps;30 seconds   Lumbar Exercises: Supine   Ab Set 5 reps;5 seconds   Lumbar Exercises: Prone   Straight Leg Raise 10 reps  with pelvic press   Other Prone Lumbar Exercises pelvic press x 5 sec hold, with single and double knee bends x  10 each,    Lumbar Exercises: Quadruped   [place] Horse 5 reps   Modalities   Modalities Ultrasound   Ultrasound   Ultrasound Location bil SIJ and lumbar paraspinals   Ultrasound Parameters 1.5 wcm2 1 mhz cont x 12 min   Ultrasound Goals Pain   Manual Therapy   Manual therapy comments Prone over folded pillow:  STW/M x 9  minutes to lower lumbar reion.                PT Education - 04/09/15 1211    Education provided Yes   Education Details HEP: transverse abdominus 10 sec contraction hooklying and with ADLS   Person(s) Educated Patient   Methods Explanation;Demonstration   Comprehension Verbalized understanding;Returned demonstration          PT Short Term Goals - 03/19/15 1310    PT SHORT TERM GOAL #1   Title Ind with HEP.   Time 2   Period Weeks   Status New           PT Long Term Goals - 03/19/15 1311    PT LONG TERM GOAL #1   Title Sit 30 minutes with pain not > 4/10.   Time 4   Period Weeks   Status New   PT LONG TERM GOAL #2   Title Stand 30 minutes with pain not > 4/10.   Time 4   Period Weeks   Status New   PT LONG TERM GOAL #3   Title Perform ADL's with pain not > 4/10.   Time 4   Period Weeks   Status New               Plan - 04/09/15 1212    Clinical Impression Statement Patient tolerated treatment well today. She was able to perform good MF and TrAb contractions with therex and will benefit from progressing these.    PT Next Visit Plan Continue STW/M to lumbar and lumbo-sacral region.  Core exercise as tolerated. [place] prn.        Problem List Patient Active Problem List   Diagnosis Date Noted  . Mucosal abnormality of stomach   . Nausea without vomiting 02/17/2015  . Loss of weight 02/17/2015  . Chronic folliculitis 62/95/2841  . 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 Insomnia 03/12/2014  . Folliculitis 32/44/0102  . Cirrhosis of liver 11/15/2013  . Lower extremity edema 11/15/2013  . Abdominal pain, chronic, epigastric  07/10/2013  . Anorexia nervosa 07/10/2013  . Atypical squamous cell changes of undetermined significance (ASCUS) on vaginal cytology with positive high risk human papilloma virus (HPV)  05/14/2013  . Muscle weakness (generalized) 12/28/2011  . CARPAL TUNNEL SYNDROME 10/16/2008  . ABDOMINAL BLOATING 10/16/2008  . Alcohol abuse 04/25/2008  . TOBACCO ABUSE 04/25/2008  . POST TRAUMATIC STRESS SYNDROME 04/22/2008  . DEPRESSION 04/22/2008  . INTRACRANIAL HEMORRHAGE 04/22/2008  . Esophageal reflux 04/22/2008  . INTUSSUSCEPTION 04/22/2008  . ABDOMINAL ADHESIONS 04/22/2008  . Hepatic cirrhosis due to chronic hepatitis C infection 04/22/2008  . OBSTRUCTION OF BILE DUCT 04/22/2008  . LOW BACK PAIN, CHRONIC 04/22/2008  . Abdominal pain 04/22/2008  . ABDOMINAL PAIN OTHER SPECIFIED SITE 04/22/2008  . HEPATITIS C, HX OF 04/22/2008   Madelyn Flavors PT  04/09/2015, 12:16 PM  [facility] [address] [place], [place], [address] Phone: [phone]   Fax:  [phone]

## 2015-04-10 ENCOUNTER — Telehealth: Payer: Self-pay

## 2015-04-10 MED ORDER — OXYCODONE HCL 10 MG PO TABS: 10.0000 mg | ORAL_TABLET | Freq: Four times a day (QID) | ORAL | Status: DC | PRN

## 2015-04-10 NOTE — Telephone Encounter (Signed)
RX for 7 days only.

## 2015-04-10 NOTE — Telephone Encounter (Signed)
Pt is calling to see if she can have 7 days of pain medication. She was seen at [facility] office the other day and they are sending her to pain management but she is out of her medication now. Sande Rives, [phone]) told her to call us. She also said that we could call her if we had any questions about the pain medications. Please advise

## 2015-04-10 NOTE — Telephone Encounter (Signed)
Pt is aware and she will come by to pick up

## 2015-04-11 ENCOUNTER — Encounter: Payer: Self-pay | Admitting: *Deleted

## 2015-04-11 DIAGNOSIS — E65 Localized adiposity: Secondary | ICD-10-CM | POA: Diagnosis not present

## 2015-04-11 DIAGNOSIS — Z720 Tobacco use: Secondary | ICD-10-CM | POA: Diagnosis not present

## 2015-04-11 DIAGNOSIS — L905 Scar conditions and fibrosis of skin: Secondary | ICD-10-CM | POA: Diagnosis not present

## 2015-04-15 ENCOUNTER — Encounter: Payer: Self-pay | Admitting: *Deleted

## 2015-04-16 ENCOUNTER — Encounter: Payer: Self-pay | Admitting: Gastroenterology

## 2015-04-16 ENCOUNTER — Ambulatory Visit (INDEPENDENT_AMBULATORY_CARE_PROVIDER_SITE_OTHER): Payer: Medicare Other | Admitting: Gastroenterology

## 2015-04-16 ENCOUNTER — Other Ambulatory Visit: Payer: Self-pay

## 2015-04-16 VITALS — BP 145/95 | HR 92 | Temp 97.6°F | Ht 65.0 in | Wt 151.4 lb

## 2015-04-16 DIAGNOSIS — R1900 Intra-abdominal and pelvic swelling, mass and lump, unspecified site: Secondary | ICD-10-CM

## 2015-04-16 DIAGNOSIS — B182 Chronic viral hepatitis C: Secondary | ICD-10-CM | POA: Diagnosis not present

## 2015-04-16 DIAGNOSIS — K7460 Unspecified cirrhosis of liver: Principal | ICD-10-CM

## 2015-04-16 DIAGNOSIS — R1013 Epigastric pain: Secondary | ICD-10-CM | POA: Diagnosis not present

## 2015-04-16 DIAGNOSIS — R109 Unspecified abdominal pain: Secondary | ICD-10-CM

## 2015-04-16 DIAGNOSIS — R1906 Epigastric swelling, mass or lump: Secondary | ICD-10-CM | POA: Diagnosis not present

## 2015-04-16 DIAGNOSIS — G8929 Other chronic pain: Secondary | ICD-10-CM

## 2015-04-16 MED ORDER — OXYCODONE HCL 10 MG PO TABS: 10.0000 mg | ORAL_TABLET | Freq: Four times a day (QID) | ORAL | Status: DC | PRN

## 2015-04-16 NOTE — Progress Notes (Signed)
Primary Care Physician: Rosita Fire, MD  Primary Gastroenterologist:  Garfield Cornea, MD   Chief Complaint  Patient presents with  . Follow-up    HPI: Sabrina Wyatt is a 64 y.o. female here for follow-up. Last seen in the office in June 2016. She has a history of cirrhosis secondary to hepatitis C (status post eradication). History of remote bile duct injury at time of cholecystectomy. Required percutaneous intervention of left hepatic duct stricture with upstream obstructing stone requiring lithotripsy at [facility] in the most recent past. Chronic abdominal pain followed at pain clinic (Dr. Lovenia Shuck). 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 Surgery with Dr. Bailey Mech at [facility] in 05/2012. She had exploratory laparotomy with resection of jejunostomy, and end-to-end 2 layer handsewn jejunostomy, lysis of adhesions. No evidence of small bowel tumor. History of pulmonary nodules with repeat CT of the chest recommended, which was done in 05/2014 and no further evaluation needed.  EGD with biopsy on 03/06/2015 for epigastric pain. No evidence of varices. Diffuse snakeskin or fish scale appearance noted. Biopsy with mild chronic gastritis. Did not feel like this explain her abdominal pain.   Recently followed up with Dr. Bailey Mech for ongoing epigastric pain. He did not feel like she had any evidence of recurrent biliary stricture, cholangitis, incisional hernia that would explain her pain. Patient requested referral to a different pain clinic. First appointment 05/22/15 (Dr. Red Christians at [facility]). States she was discharged from Dr. Maryjean Ka practice for [facility]. Also had reported stolen pain medications but tested positive for drug. Patient states she found five in a pill box which she used. Has been on pain medications for 14 years. Usually takes 4-5 per day.    Also with a lot back pain, lumbar spine. Seen by plastic surgeon, Dr. Signe Colt, at [facility]. Wanted abdominal scar tissue surgery with hopes to help her back pain. Continues to intermittently go to PT for abdominal wall scaring. Reviewed Dr. Judeth Cornfield consult note. Patient "not a candidate" for "cosmetic" abdominal surgery at this time. Recommended weight loss and smoking cessation before consideration.    Patient continues to complain of chronic abdominal pain. Feel like an "[place] burn". Notes it with coughing too. Abdominal swelling worsens throughout the day. Starts as soon as she eats first meal of the day. BM regular. No melena, brbpr. No heartburn, vomiting.    Current Outpatient Prescriptions  Medication Sig Dispense Refill  . albuterol (PROVENTIL) (2.5 MG/3ML) 0.083% nebulizer solution Take 2.5 mg by nebulization every 6 (six) hours as needed for wheezing or shortness of breath.    Marland Kitchen atenolol (TENORMIN) 25 MG tablet Take 25 mg by mouth daily.     . Cholecalciferol (VITAMIN D-3) 1000 UNITS CAPS Take 1,000 capsules by mouth daily.    . cloNIDine (CATAPRES) 0.1 MG tablet Take 0.1 mg by mouth every 8 (eight) hours as needed (for withdrawal symptoms).    . DULoxetine (CYMBALTA) 60 MG capsule Take 60 mg by mouth 2 (two) times daily.     Marland Kitchen EPIPEN 2-PAK 0.3 MG/0.3ML SOAJ injection Inject 0.3 mg into the skin as needed (allergic reactions).     Marland Kitchen etodolac (LODINE) 400 MG tablet Take 400 mg by mouth 2 (two) times daily.     . fluticasone (FLONASE) 50 MCG/ACT nasal spray Place 2 sprays into the nose daily.      Marland Kitchen loratadine (CLARITIN) 10 MG tablet Take 10 mg by mouth daily.    Marland Kitchen  Omega-3 Fatty Acids (FISH OIL PO) Take 2 capsules by mouth daily.     Marland Kitchen omeprazole (PRILOSEC) 20 MG capsule TAKE ONE CAPSULE BY MOUTH EVERY DAY 30 capsule 11  . Oxycodone HCl 10 MG TABS Take 1 tablet (10 mg total) by mouth every 6 (six) hours as needed. pain 72 tablet 0  . PROAIR HFA 108 (90 BASE) MCG/ACT inhaler Inhale 2 puffs into the lungs every 6 (six) hours as needed for wheezing or shortness of  breath.     . promethazine (PHENERGAN) 25 MG tablet Take 25 mg by mouth every 6 (six) hours as needed for nausea or vomiting (for nausea for withdrawals).     No current facility-administered medications for this visit.    Allergies as of 04/16/2015  . (No Known Allergies)    ROS:  General: Negative for anorexia, weight loss, fever, chills, fatigue, weakness. ENT: Negative for hoarseness, difficulty swallowing , nasal congestion. CV: Negative for chest pain, angina, palpitations, dyspnea on exertion, peripheral edema.  Respiratory: Negative for dyspnea at rest, dyspnea on exertion, cough, sputum, wheezing.  GI: See history of present illness. GU:  Negative for dysuria, hematuria, urinary incontinence, urinary frequency, nocturnal urination.  Endo: Negative for unusual weight change.    Physical Examination:   BP 145/95 mmHg  Pulse 92  Temp(Src) 97.6 F (36.4 C) (Oral)  Ht 5\' 5"  (1.651 m)  Wt 151 lb 6.4 oz (68.675 kg)  BMI 25.19 kg/m2  General: Well-nourished, well-developed in no acute distress.  Eyes: No icterus. Mouth: Oropharyngeal mucosa moist and pink , no lesions erythema or exudate. Lungs: Clear to auscultation bilaterally.  Heart: Regular rate and rhythm, no murmurs rubs or gallops.  Abdomen: Bowel sounds are normal,mild epigastric tenderness with palpable fullness/?mass which is tender, nondistended, no hepatosplenomegaly, no abdominal bruits or hernia , no rebound or guarding.   Extremities: No lower extremity edema. No clubbing or deformities. Neuro: Alert and oriented x 4   Skin: Warm and dry, no jaundice.   Psych: Alert and cooperative, normal mood and affect.  Labs:  Lab Results  Component Value Date   LIPASE 27 02/28/2015   Lab Results  Component Value Date   CREATININE 0.74 01/31/2015   BUN 12 01/31/2015   NA 140 01/31/2015   K 4.1 01/31/2015   CL 104 01/31/2015   CO2 23 01/31/2015   Lab Results  Component Value Date   ALT 15 01/31/2015   AST  19 01/31/2015   ALKPHOS 101 01/31/2015   BILITOT 0.7 01/31/2015   Lab Results  Component Value Date   INR 1.05 01/31/2015   INR 1.00 02/14/2013   INR 1.00 05/20/2011     Imaging Studies:   Last abdominal ultrasound April 2016 with evidence of cirrhotic change, splenomegaly but no evidence of hepatic masses.

## 2015-04-16 NOTE — Patient Instructions (Signed)
1. CTE as scheduled.  2. We will supply you with RX for pain medication only until your pain clinic appointment on 05/22/15.  3. Return to the office to see Dr. Gala Romney in 3 months.

## 2015-04-18 ENCOUNTER — Encounter: Payer: Self-pay | Admitting: *Deleted

## 2015-04-18 ENCOUNTER — Ambulatory Visit: Payer: Medicare Other | Admitting: *Deleted

## 2015-04-18 DIAGNOSIS — M545 Low back pain, unspecified: Secondary | ICD-10-CM

## 2015-04-18 NOTE — Therapy (Signed)
[facility] [place], [place], [address] Phone: [phone]   Fax:  [phone]  Physical Therapy Treatment  Patient Details  Name: Sabrina Wyatt MRN: [id number] Date of Birth: Jul 26, 1951 Referring Provider:  Rosita Fire, MD  Encounter Date: 04/18/2015      PT End of Session - 04/18/15 1247    Visit Number 3   Number of Visits 12   Date for PT Re-Evaluation 05/15/15   PT Start Time 1030   PT Stop Time 1118   PT Time Calculation (min) 48 min      Past Medical History  Diagnosis Date  . GERD (gastroesophageal reflux disease)   . Cirrhosis, hepatitis C     CT on 03/26/13= no HCC, pt has been vaccinated for Hep A and Hep B. s/p treatment of HCV with eradication  . Depression   . Carpal tunnel syndrome   . S/P endoscopy 08/27/10    antral erosions, otherwise normal, due egd 08/2012 to screen for varices  . Abdominal wall pain     chronic  . Pulmonary nodules   . PTSD (post-traumatic stress disorder)   . Chronic low back pain   . 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 Polysubstance abuse     HX of  . S/P colonoscopy 2005    Dr Moss Mc polyp removed, otherwise normal  . Hypertension   . Tobacco abuse   . Intussusception 04/2012  . Shortness of breath   . Chronic abdominal pain   . Biliary stone   . Seizures 1998    with brain bleed x1. None since then and on no meds    Past Surgical History  Procedure Laterality Date  . Cholecystectomy  2002  . Percutaneous transhepatic cholangiograhpy and billiary drainage  2002  . Roux-en-y-hepatojejunostomy  2003    revision in 2013 for intussusception [facility] Dr. Bailey Mech)  . Spinal fusion      C5-C7  . Carpal tunnel release Bilateral 12/2010  . Esophagogastroduodenoscopy    09/10/2003    Small hiatal hernia; otherwise normal stomach, normal D1 and D2  . Colonoscopy    09/10/2003    diminutive polyp in the rectum cold biopsied/removed/ Normal colon  .  Esophagogastroduodenoscopy  08/27/2010    LFY:BOFBPZ esophagus.  No varices.  Couple of tiny antral erosions of doubtful clinical significance, otherwise normal stomach, D1 and D2.  . Esophagogastroduodenoscopy (egd) with propofol N/A 08/02/2013    RMR: Portal gastropathy. No explanation for abdominal pain which I believe is more abdominal wall in origin. She has been seen by Dr. Carlis Abbott  over at [facility]. She is up-to-date on cross sectional imaging. She has a pain management physician in [place]. Repeat EGD for varices in 3 years.  . Colonoscopy with propofol N/A 08/02/2013    WCH:ENIDPOE diverticulosis. next TCS 10 years.  . Biliary stone removal  2015  . Lumbar fusion    . Nose surgery  1970  . Esophagogastroduodenoscopy (egd) with propofol N/A 03/06/2015    UMP:NTIRWERX gastric mucosac s/p bx  . Esophageal biopsy N/A 03/06/2015    Procedure: BIOPSY;  Surgeon: Daneil Dolin, MD;  Location: [facility];  Service: Endoscopy;  Laterality: N/A;    There were no vitals filed for this visit.  Visit Diagnosis:  Midline low back pain without sciatica      Subjective Assessment - 04/18/15 1037    Subjective "I got out of bed so that's a good thing" doing about  the same   Limitations Sitting;Standing;Walking   How long can you sit comfortably? 15 minutes.   How long can you stand comfortably? 15 minutes.   How long can you walk comfortably? 15 minutes.   Patient Stated Goals Decrease pain.   Currently in Pain? Yes   Pain Score 7    Pain Location Back   Pain Orientation Mid;Lower   Pain Descriptors / Indicators Aching;Throbbing   Pain Type Chronic pain                         [facility] Adult PT Treatment/Exercise - 04/18/15 0001    Modalities   Modalities Ultrasound   Ultrasound   Ultrasound Location bil SIJ and LB paras   Ultrasound Parameters 1.5 w/cm2 x 10 min prone   Ultrasound Goals Pain   Manual Therapy   Manual Therapy Myofascial release;Soft tissue mobilization    Manual therapy comments Prone over folded pillow:  STW/M, IASTM to lower lumbar reion.   Myofascial Release IASTM to incisions in Abdomen area supine, and Scar mobilization                  PT Short Term Goals - 03/19/15 1310    PT SHORT TERM GOAL #1   Title Ind with HEP.   Time 2   Period Weeks   Status New           PT Long Term Goals - 03/19/15 1311    PT LONG TERM GOAL #1   Title Sit 30 minutes with pain not > 4/10.   Time 4   Period Weeks   Status New   PT LONG TERM GOAL #2   Title Stand 30 minutes with pain not > 4/10.   Time 4   Period Weeks   Status New   PT LONG TERM GOAL #3   Title Perform ADL's with pain not > 4/10.   Time 4   Period Weeks   Status New               Plan - 04/18/15 1038    Clinical Impression Statement Pt did fairly well with Rx today, but continues to have LBP and feels like she has a lot of pressure on her LB. She did well with STW to her abdomen area to try and release tight anterior structures to decrease  stress to lumbar spine. Goals are ongoing   Pt will benefit from skilled therapeutic intervention in order to improve on the following deficits Pain;Decreased activity tolerance   Rehab Potential Fair   PT Frequency 2x / week   PT Duration 6 weeks   PT Treatment/Interventions ADLs/Self Care Home Management;Electrical Stimulation;Manual techniques;Therapeutic exercise;Ultrasound   PT Next Visit Plan Continue STW/M to lumbar and lumbo-sacral region.  Core exercise as tolerated. [place] prn.   Consulted and Agree with Plan of Care Patient        Problem List Patient Active Problem List   Diagnosis Date Noted  . Epigastric mass 04/16/2015  . Abdominal swelling 04/16/2015  . Mucosal abnormality of stomach   . Nausea without vomiting 02/17/2015  . Loss of weight 02/17/2015  . Chronic folliculitis 33/82/5053  . 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 Insomnia 03/12/2014  . Folliculitis 97/67/3419  . Cirrhosis of liver  11/15/2013  . Lower extremity edema 11/15/2013  . Abdominal pain, chronic, epigastric 07/10/2013  . Anorexia nervosa 07/10/2013  . Atypical squamous cell changes of undetermined significance (ASCUS) on vaginal cytology  with positive high risk human papilloma virus (HPV) 05/14/2013  . Muscle weakness (generalized) 12/28/2011  . CARPAL TUNNEL SYNDROME 10/16/2008  . ABDOMINAL BLOATING 10/16/2008  . Alcohol abuse 04/25/2008  . TOBACCO ABUSE 04/25/2008  . POST TRAUMATIC STRESS SYNDROME 04/22/2008  . DEPRESSION 04/22/2008  . INTRACRANIAL HEMORRHAGE 04/22/2008  . Esophageal reflux 04/22/2008  . INTUSSUSCEPTION 04/22/2008  . ABDOMINAL ADHESIONS 04/22/2008  . Hepatic cirrhosis due to chronic hepatitis C infection 04/22/2008  . OBSTRUCTION OF BILE DUCT 04/22/2008  . LOW BACK PAIN, CHRONIC 04/22/2008  . Abdominal pain 04/22/2008  . ABDOMINAL PAIN OTHER SPECIFIED SITE 04/22/2008  . HEPATITIS C, HX OF 04/22/2008    Fritzi Scripter,CHRIS, PTA 04/18/2015, 12:52 PM  [facility] [address] [place], [place], [address] Phone: [phone]   Fax:  [phone]

## 2015-04-20 NOTE — Assessment & Plan Note (Signed)
Chronic abdominal pain with epigastric fullness/?mass noted on exam. Extensive history as outlined above. Proceed with CTE as next step per Dr. Roseanne Kaufman recommendation especially with abdominal exam today. Given she is transitioning from one pain clinic to second one, her h/o 14 years of daily pain medications, I agreed to provide her with RXs to cover her until her appointment 05/22/15. Patient understands we will not provide her with additional RX for pain medication beyond this date.

## 2015-04-20 NOTE — Assessment & Plan Note (Signed)
Currently up to date on labs and imaging. Return to see Dr. Gala Romney in 3 months.

## 2015-04-21 ENCOUNTER — Ambulatory Visit (HOSPITAL_COMMUNITY)
Admission: RE | Admit: 2015-04-21 | Discharge: 2015-04-21 | Disposition: A | Discharge status: Home / Self Care | Payer: Medicare Other | Source: Ambulatory Visit | Attending: Gastroenterology | Admitting: Gastroenterology

## 2015-04-21 DIAGNOSIS — G8929 Other chronic pain: Secondary | ICD-10-CM | POA: Diagnosis not present

## 2015-04-21 DIAGNOSIS — Z9049 Acquired absence of other specified parts of digestive tract: Secondary | ICD-10-CM | POA: Diagnosis not present

## 2015-04-21 DIAGNOSIS — Z980 Intestinal bypass and anastomosis status: Secondary | ICD-10-CM | POA: Insufficient documentation

## 2015-04-21 DIAGNOSIS — K449 Diaphragmatic hernia without obstruction or gangrene: Secondary | ICD-10-CM | POA: Insufficient documentation

## 2015-04-21 DIAGNOSIS — R109 Unspecified abdominal pain: Secondary | ICD-10-CM | POA: Diagnosis not present

## 2015-04-21 DIAGNOSIS — K7460 Unspecified cirrhosis of liver: Secondary | ICD-10-CM | POA: Insufficient documentation

## 2015-04-21 DIAGNOSIS — B182 Chronic viral hepatitis C: Secondary | ICD-10-CM | POA: Insufficient documentation

## 2015-04-21 DIAGNOSIS — R1900 Intra-abdominal and pelvic swelling, mass and lump, unspecified site: Secondary | ICD-10-CM

## 2015-04-21 LAB — POCT I-STAT CREATININE: Creatinine, Ser: 0.8 mg/dL (ref 0.44–1.00)

## 2015-04-21 MED ORDER — BARIUM SULFATE 0.1 % PO SUSP: 450.0000 mL | Freq: Once | ORAL | Status: DC

## 2015-04-21 MED ORDER — BARIUM SULFATE 0.1 % PO SUSP
ORAL | Status: AC
  Filled 2015-04-21: qty 3

## 2015-04-21 MED ORDER — IOHEXOL 300 MG/ML  SOLN
100.0000 mL | Freq: Once | INTRAMUSCULAR | Status: AC | PRN
  Administered 2015-04-21: 100 mL via INTRAVENOUS

## 2015-04-22 NOTE — Progress Notes (Signed)
cc'ed to pcp °

## 2015-04-30 NOTE — Progress Notes (Signed)
Quick Note:  Please let patient know her CTE was unremarkable for acute findings. Known cirrhosis. Stable pulmonary nodule known as well. Nothing to explain her pain.  I would recommend return to see Dr. Gala Romney nonurgently to see if he may have some other suggestions with regards to her abdominal pain. ______

## 2015-05-01 ENCOUNTER — Telehealth: Payer: Self-pay | Admitting: *Deleted

## 2015-05-01 ENCOUNTER — Encounter: Payer: Self-pay | Admitting: Physical Therapy

## 2015-05-01 ENCOUNTER — Other Ambulatory Visit: Payer: Self-pay | Admitting: Obstetrics and Gynecology

## 2015-05-01 ENCOUNTER — Other Ambulatory Visit: Payer: Self-pay | Admitting: *Deleted

## 2015-05-01 NOTE — Telephone Encounter (Signed)
Pt states RX for Acyclovir not at pharmacy. Pt informed to have pharmacy fax a refill request. Pt verbalized understanding.

## 2015-05-06 ENCOUNTER — Encounter: Payer: Self-pay | Admitting: *Deleted

## 2015-05-06 ENCOUNTER — Ambulatory Visit: Payer: Medicare Other | Attending: Anesthesiology | Admitting: *Deleted

## 2015-05-06 ENCOUNTER — Encounter: Payer: Self-pay | Admitting: Internal Medicine

## 2015-05-06 DIAGNOSIS — M545 Low back pain, unspecified: Secondary | ICD-10-CM

## 2015-05-06 NOTE — Therapy (Signed)
[facility] [place], [place], [address] Phone: [phone]   Fax:  [phone]  Physical Therapy Treatment  Patient Details  Name: Sabrina Wyatt MRN: [id number] Date of Birth: [birth year] Referring Provider:  Rosita Fire, MD  Encounter Date: 05/06/2015      PT End of Session - 05/06/15 1213    Visit Number 4   Number of Visits 12   Date for PT Re-Evaluation 05/15/15   PT Start Time 1129   PT Stop Time 1205   PT Time Calculation (min) 36 min      Past Medical History  Diagnosis Date  . GERD (gastroesophageal reflux disease)   . Cirrhosis, hepatitis C     CT on 03/26/13= no HCC, pt has been vaccinated for Hep A and Hep B. s/p treatment of HCV with eradication  . Depression   . Carpal tunnel syndrome   . S/P endoscopy 08/27/10    antral erosions, otherwise normal, due egd 08/2012 to screen for varices  . Abdominal wall pain     chronic  . Pulmonary nodules   . PTSD (post-traumatic stress disorder)   . Chronic low back pain   . 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 Polysubstance abuse     HX of  . S/P colonoscopy 2005    Dr Moss Mc polyp removed, otherwise normal  . Hypertension   . Tobacco abuse   . Intussusception 04/2012  . Shortness of breath   . Chronic abdominal pain   . Biliary stone   . Seizures 1998    with brain bleed x1. None since then and on no meds    Past Surgical History  Procedure Laterality Date  . Cholecystectomy  2002  . Percutaneous transhepatic cholangiograhpy and billiary drainage  2002  . Roux-en-y-hepatojejunostomy  2003    revision in 2013 for intussusception [facility] Dr. Bailey Mech)  . Spinal fusion      C5-C7  . Carpal tunnel release Bilateral 12/2010  . Esophagogastroduodenoscopy    09/10/2003    Small hiatal hernia; otherwise normal stomach, normal D1 and D2  . Colonoscopy    09/10/2003    diminutive polyp in the rectum cold biopsied/removed/ Normal colon  .  Esophagogastroduodenoscopy  08/27/2010    FOY:DXAJOI esophagus.  No varices.  Couple of tiny antral erosions of doubtful clinical significance, otherwise normal stomach, D1 and D2.  . Esophagogastroduodenoscopy (egd) with propofol N/A 08/02/2013    RMR: Portal gastropathy. No explanation for abdominal pain which I believe is more abdominal wall in origin. She has been seen by Dr. Carlis Abbott  over at [facility]. She is up-to-date on cross sectional imaging. She has a pain management physician in [place]. Repeat EGD for varices in 3 years.  . Colonoscopy with propofol N/A 08/02/2013    NOM:VEHMCNO diverticulosis. next TCS 10 years.  . Biliary stone removal  2015  . Lumbar fusion    . Nose surgery  1970  . Esophagogastroduodenoscopy (egd) with propofol N/A 03/06/2015    BSJ:GGEZMOQH gastric mucosac s/p bx  . Esophageal biopsy N/A 03/06/2015    Procedure: BIOPSY;  Surgeon: Daneil Dolin, MD;  Location: [facility];  Service: Endoscopy;  Laterality: N/A;    There were no vitals filed for this visit.  Visit Diagnosis:  Midline low back pain without sciatica      Subjective Assessment - 05/06/15 1128    Subjective LBP constant ache   Limitations Sitting;Standing;Walking   How long can  you sit comfortably? 15 minutes.   How long can you stand comfortably? 15 minutes.   How long can you walk comfortably? 15 minutes.   Patient Stated Goals Decrease pain.   Pain Score 7    Pain Location Back   Pain Orientation Mid;Lower   Pain Descriptors / Indicators Aching;Throbbing   Pain Type Chronic pain                         [facility] Adult PT Treatment/Exercise - 05/06/15 0001    Modalities   Modalities Ultrasound   Ultrasound   Ultrasound Location BIL SIJ and LB paras   Ultrasound Parameters 1.5 w/cm2 x12 mins , 1 mhz   Ultrasound Goals Pain   Manual Therapy   Manual Therapy Myofascial release;Soft tissue mobilization   Manual therapy comments Prone over folded pillow:  STW/M, IASTM to  lower lumbar reion.and BIL SIJ   Myofascial Release IASTM to incisions in Abdomen area supine, and Scar mobilization                  PT Short Term Goals - 03/19/15 1310    PT SHORT TERM GOAL #1   Title Ind with HEP.   Time 2   Period Weeks   Status New           PT Long Term Goals - 03/19/15 1311    PT LONG TERM GOAL #1   Title Sit 30 minutes with pain not > 4/10.   Time 4   Period Weeks   Status New   PT LONG TERM GOAL #2   Title Stand 30 minutes with pain not > 4/10.   Time 4   Period Weeks   Status New   PT LONG TERM GOAL #3   Title Perform ADL's with pain not > 4/10.   Time 4   Period Weeks   Status New               Plan - 05/06/15 1241    Clinical Impression Statement Pt did fair with Rx today and had some relief in LBP after RX. She was wearing  a LB  Brace  to help support abdomen and LB. She had higher pain today across LB and in Bilateral SIJ's. She continues to perform stretches and Core exs. Goals are ongoing    Pt will benefit from skilled therapeutic intervention in order to improve on the following deficits Pain;Decreased activity tolerance   Rehab Potential Fair   PT Duration 6 weeks   PT Treatment/Interventions ADLs/Self Care Home Management;Electrical Stimulation;Manual techniques;Therapeutic exercise;Ultrasound   PT Next Visit Plan Continue STW/M to lumbar and lumbo-sacral region.  Core exercise as tolerated. [place] prn. SIJ Taping for support?   Consulted and Agree with Plan of Care Patient        Problem List Patient Active Problem List   Diagnosis Date Noted  . Epigastric mass 04/16/2015  . Abdominal swelling 04/16/2015  . Mucosal abnormality of stomach   . Nausea without vomiting 02/17/2015  . Loss of weight 02/17/2015  . Chronic folliculitis 73/71/0626  . 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 Insomnia 03/12/2014  . Folliculitis 94/85/4627  . Cirrhosis of liver 11/15/2013  . Lower extremity edema 11/15/2013  . Abdominal pain,  chronic, epigastric 07/10/2013  . Anorexia nervosa 07/10/2013  . 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 Muscle weakness (generalized) 12/28/2011  .  CARPAL TUNNEL SYNDROME 10/16/2008  . ABDOMINAL BLOATING 10/16/2008  . Alcohol abuse 04/25/2008  . TOBACCO ABUSE 04/25/2008  . POST TRAUMATIC STRESS SYNDROME 04/22/2008  . DEPRESSION 04/22/2008  . INTRACRANIAL HEMORRHAGE 04/22/2008  . Esophageal reflux 04/22/2008  . INTUSSUSCEPTION 04/22/2008  . ABDOMINAL ADHESIONS 04/22/2008  . Hepatic cirrhosis due to chronic hepatitis C infection 04/22/2008  . OBSTRUCTION OF BILE DUCT 04/22/2008  . LOW BACK PAIN, CHRONIC 04/22/2008  . Abdominal pain 04/22/2008  . ABDOMINAL PAIN OTHER SPECIFIED SITE 04/22/2008  . HEPATITIS C, HX OF 04/22/2008    Calysta Craigo,CHRIS, PTA 05/06/2015, 12:49 PM  [facility] [address] [place], [place], [address] Phone: [phone]   Fax:  [phone]

## 2015-05-06 NOTE — Progress Notes (Signed)
APPT MADE AND LETTER SENT  °

## 2015-05-08 ENCOUNTER — Encounter: Payer: Self-pay | Admitting: *Deleted

## 2015-05-08 ENCOUNTER — Other Ambulatory Visit: Payer: Self-pay | Admitting: Obstetrics and Gynecology

## 2015-05-08 DIAGNOSIS — Z1231 Encounter for screening mammogram for malignant neoplasm of breast: Secondary | ICD-10-CM

## 2015-05-13 ENCOUNTER — Ambulatory Visit: Payer: Medicare Other | Admitting: Physical Therapy

## 2015-05-13 ENCOUNTER — Telehealth: Payer: Self-pay | Admitting: Internal Medicine

## 2015-05-13 DIAGNOSIS — M545 Low back pain, unspecified: Secondary | ICD-10-CM

## 2015-05-13 NOTE — Telephone Encounter (Signed)
Letter mailed

## 2015-05-13 NOTE — Therapy (Signed)
[facility] [place], [place], [address] Phone: [phone]   Fax:  [phone]  Physical Therapy Treatment  Patient Details  Name: Sabrina Wyatt MRN: [id number] Date of Birth: [birth year] Referring Provider:  Rosita Fire, MD  Encounter Date: 05/13/2015      PT End of Session - 05/13/15 1034    Visit Number 5   Number of Visits 12   Date for PT Re-Evaluation 05/15/15   PT Start Time 1950   PT Stop Time 1112   PT Time Calculation (min) 32 min   Activity Tolerance Patient tolerated treatment well   Behavior During Therapy [facility] for tasks assessed/performed      Past Medical History  Diagnosis Date  . GERD (gastroesophageal reflux disease)   . Cirrhosis, hepatitis C     CT on 03/26/13= no HCC, pt has been vaccinated for Hep A and Hep B. s/p treatment of HCV with eradication  . Depression   . Carpal tunnel syndrome   . S/P endoscopy 08/27/10    antral erosions, otherwise normal, due egd 08/2012 to screen for varices  . Abdominal wall pain     chronic  . Pulmonary nodules   . PTSD (post-traumatic stress disorder)   . Chronic low back pain   . 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 Polysubstance abuse     HX of  . S/P colonoscopy 2005    Dr Moss Mc polyp removed, otherwise normal  . Hypertension   . Tobacco abuse   . Intussusception 04/2012  . Shortness of breath   . Chronic abdominal pain   . Biliary stone   . Seizures 1998    with brain bleed x1. None since then and on no meds    Past Surgical History  Procedure Laterality Date  . Cholecystectomy  2002  . Percutaneous transhepatic cholangiograhpy and billiary drainage  2002  . Roux-en-y-hepatojejunostomy  2003    revision in 2013 for intussusception [facility] Dr. Bailey Mech)  . Spinal fusion      C5-C7  . Carpal tunnel release Bilateral 12/2010  . Esophagogastroduodenoscopy    09/10/2003    Small hiatal hernia; otherwise normal stomach, normal  D1 and D2  . Colonoscopy    09/10/2003    diminutive polyp in the rectum cold biopsied/removed/ Normal colon  . Esophagogastroduodenoscopy  08/27/2010    DTO:IZTIWP esophagus.  No varices.  Couple of tiny antral erosions of doubtful clinical significance, otherwise normal stomach, D1 and D2.  . Esophagogastroduodenoscopy (egd) with propofol N/A 08/02/2013    RMR: Portal gastropathy. No explanation for abdominal pain which I believe is more abdominal wall in origin. She has been seen by Dr. Carlis Abbott  over at [facility]. She is up-to-date on cross sectional imaging. She has a pain management physician in [place]. Repeat EGD for varices in 3 years.  . Colonoscopy with propofol N/A 08/02/2013    YKD:XIPJASN diverticulosis. next TCS 10 years.  . Biliary stone removal  2015  . Lumbar fusion    . Nose surgery  1970  . Esophagogastroduodenoscopy (egd) with propofol N/A 03/06/2015    KNL:ZJQBHALP gastric mucosac s/p bx  . Esophageal biopsy N/A 03/06/2015    Procedure: BIOPSY;  Surgeon: Daneil Dolin, MD;  Location: [facility];  Service: Endoscopy;  Laterality: N/A;    There were no vitals filed for this visit.  Visit Diagnosis:  Midline low back pain without sciatica      Subjective Assessment - 05/13/15  1036    Subjective no new complaints. She reports relief for about 1.5 days with therapy.    Currently in Pain? Yes   Pain Score 7    Pain Location Back   Pain Orientation Mid;Lower   Pain Descriptors / Indicators Aching;Throbbing   Pain Type Chronic pain                         [facility] Adult PT Treatment/Exercise - 05/13/15 0001    Modalities   Modalities Ultrasound   Ultrasound   Ultrasound Location bil SIJ and lumbar paraspinals   Ultrasound Parameters 1.5 wcm2 1.0 Mz cont x 10 min   Ultrasound Goals Pain   Manual Therapy   Manual Therapy Soft tissue mobilization;Myofascial release   Manual therapy comments Prone over folded pillow:  STW/M, IASTM to lower lumbar reion.and  BIL SIJ                  PT Short Term Goals - 03/19/15 1310    PT SHORT TERM GOAL #1   Title Ind with HEP.   Time 2   Period Weeks   Status New           PT Long Term Goals - 03/19/15 1311    PT LONG TERM GOAL #1   Title Sit 30 minutes with pain not > 4/10.   Time 4   Period Weeks   Status New   PT LONG TERM GOAL #2   Title Stand 30 minutes with pain not > 4/10.   Time 4   Period Weeks   Status New   PT LONG TERM GOAL #3   Title Perform ADL's with pain not > 4/10.   Time 4   Period Weeks   Status New               Plan - 05/13/15 1245    Clinical Impression Statement Patient tolerated treatment today without increased pain and reported relief after STW. She continues to wear lumbar brace for support.   PT Next Visit Plan Continue STW/M to lumbar and lumbo-sacral region prn. Assess goals.  Core exercise as tolerated. [place] prn. SIJ Taping for support?        Problem List Patient Active Problem List   Diagnosis Date Noted  . Epigastric mass 04/16/2015  . Abdominal swelling 04/16/2015  . Mucosal abnormality of stomach   . Nausea without vomiting 02/17/2015  . Loss of weight 02/17/2015  . Chronic folliculitis 27/25/3664  . 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 Insomnia 03/12/2014  . Folliculitis 40/34/7425  . Cirrhosis of liver 11/15/2013  . Lower extremity edema 11/15/2013  . Abdominal pain, chronic, epigastric 07/10/2013  . Anorexia nervosa 07/10/2013  . 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 Muscle weakness (generalized) 12/28/2011  . CARPAL TUNNEL SYNDROME 10/16/2008  . ABDOMINAL BLOATING 10/16/2008  . Alcohol abuse 04/25/2008  . TOBACCO ABUSE 04/25/2008  . POST TRAUMATIC STRESS SYNDROME 04/22/2008  . DEPRESSION 04/22/2008  . INTRACRANIAL HEMORRHAGE 04/22/2008  . Esophageal reflux 04/22/2008  . INTUSSUSCEPTION 04/22/2008  .  ABDOMINAL ADHESIONS 04/22/2008  . Hepatic cirrhosis due to chronic hepatitis C infection 04/22/2008  . OBSTRUCTION OF BILE DUCT 04/22/2008  . LOW BACK PAIN, CHRONIC 04/22/2008  . Abdominal pain 04/22/2008  . ABDOMINAL PAIN OTHER SPECIFIED SITE 04/22/2008  . HEPATITIS C, HX OF 04/22/2008    Madelyn Flavors PT  05/13/2015, 12:52 PM  [facility] [address] [place], [place], [address] Phone: [phone]   Fax:  [phone]

## 2015-05-13 NOTE — Telephone Encounter (Signed)
OCT RECALL FOR ABD ULTRASOUND

## 2015-05-14 ENCOUNTER — Encounter: Payer: Self-pay | Admitting: Physical Therapy

## 2015-05-14 ENCOUNTER — Ambulatory Visit: Payer: Medicare Other | Admitting: Physical Therapy

## 2015-05-14 DIAGNOSIS — M545 Low back pain, unspecified: Secondary | ICD-10-CM

## 2015-05-14 NOTE — Patient Instructions (Signed)
Pelvic Tilt: Posterior - Legs Bent (Supine)  Tighten stomach and flatten back by rolling pelvis down. Hold _10___ seconds. Relax. Repeat _10-30___ times per set. Do __2__ sets per session. Do _2___ sessions per day.   Bent Leg Lift (Hook-Lying)  Tighten stomach and slowly raise right leg _5___ inches from floor. Keep trunk rigid. Hold _3___ seconds. Repeat _10___ times per set. Do ___2-3_ sets per session. Do __2__ sessions per day.   Straight Leg Raise  Tighten stomach and slowly raise locked right leg __4__ inches from floor. Repeat __10-30__ times per set. Do __2__ sets per session. Do __2__ sessions per day.  Bridging  Slowly raise buttocks from floor, keeping stomach tight. Repeat _10___ times per set. Do __2__ sets per session. Do __2__ sessions per day.    

## 2015-05-14 NOTE — Therapy (Signed)
[facility] [place], [place], [address] Phone: [phone]   Fax:  [phone]  Physical Therapy Treatment  Patient Details  Name: Sabrina Wyatt MRN: [id number] Date of Birth: 1950/12/04 Referring Provider:  Rosita Fire, MD  Encounter Date: 05/14/2015      PT End of Session - 05/14/15 1006    Visit Number 6   Number of Visits 12   Date for PT Re-Evaluation 05/15/15   PT Start Time 0946   PT Stop Time 1028   PT Time Calculation (min) 42 min   Activity Tolerance Patient tolerated treatment well   Behavior During Therapy [facility] for tasks assessed/performed      Past Medical History  Diagnosis Date  . GERD (gastroesophageal reflux disease)   . Cirrhosis, hepatitis C     CT on 03/26/13= no HCC, pt has been vaccinated for Hep A and Hep B. s/p treatment of HCV with eradication  . Depression   . Carpal tunnel syndrome   . S/P endoscopy 08/27/10    antral erosions, otherwise normal, due egd 08/2012 to screen for varices  . Abdominal wall pain     chronic  . Pulmonary nodules   . PTSD (post-traumatic stress disorder)   . Chronic low back pain   . 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 Polysubstance abuse     HX of  . S/P colonoscopy 2005    Dr Moss Mc polyp removed, otherwise normal  . Hypertension   . Tobacco abuse   . Intussusception 04/2012  . Shortness of breath   . Chronic abdominal pain   . Biliary stone   . Seizures 1998    with brain bleed x1. None since then and on no meds    Past Surgical History  Procedure Laterality Date  . Cholecystectomy  2002  . Percutaneous transhepatic cholangiograhpy and billiary drainage  2002  . Roux-en-y-hepatojejunostomy  2003    revision in 2013 for intussusception [facility] Dr. Bailey Mech)  . Spinal fusion      C5-C7  . Carpal tunnel release Bilateral 12/2010  . Esophagogastroduodenoscopy    09/10/2003    Small hiatal hernia; otherwise normal stomach, normal  D1 and D2  . Colonoscopy    09/10/2003    diminutive polyp in the rectum cold biopsied/removed/ Normal colon  . Esophagogastroduodenoscopy  08/27/2010    CXK:GYJEHU esophagus.  No varices.  Couple of tiny antral erosions of doubtful clinical significance, otherwise normal stomach, D1 and D2.  . Esophagogastroduodenoscopy (egd) with propofol N/A 08/02/2013    RMR: Portal gastropathy. No explanation for abdominal pain which I believe is more abdominal wall in origin. She has been seen by Dr. Carlis Abbott  over at [facility]. She is up-to-date on cross sectional imaging. She has a pain management physician in [place]. Repeat EGD for varices in 3 years.  . Colonoscopy with propofol N/A 08/02/2013    DJS:HFWYOVZ diverticulosis. next TCS 10 years.  . Biliary stone removal  2015  . Lumbar fusion    . Nose surgery  1970  . Esophagogastroduodenoscopy (egd) with propofol N/A 03/06/2015    CHY:IFOYDXAJ gastric mucosac s/p bx  . Esophageal biopsy N/A 03/06/2015    Procedure: BIOPSY;  Surgeon: Daneil Dolin, MD;  Location: [facility];  Service: Endoscopy;  Laterality: N/A;    There were no vitals filed for this visit.  Visit Diagnosis:  Midline low back pain without sciatica      Subjective Assessment - 05/14/15  0952    Subjective no new complaints. She reports relief for about 1.5 days with therapy.    Limitations Sitting;Standing;Walking   How long can you sit comfortably? 15 minutes.   How long can you stand comfortably? 15 minutes.   How long can you walk comfortably? 15 minutes.   Patient Stated Goals Decrease pain.   Currently in Pain? Yes   Pain Score 6    Pain Location Back   Pain Orientation Mid;Lower   Pain Descriptors / Indicators Aching;Sore   Pain Type Chronic pain   Pain Onset More than a month ago   Pain Frequency Constant   Aggravating Factors  prolong walking or standing   Pain Relieving Factors rest                         [facility] Adult PT Treatment/Exercise -  05/14/15 0001    Ultrasound   Ultrasound Location Bil SIJ paraspinals   Ultrasound Parameters 1.5w/cm2/50%/57mz x113m   Manual Therapy   Manual Therapy Soft tissue mobilization;Myofascial release   Manual therapy comments Prone over folded pillow:  STW/M, IASTM to lower lumbar reion.and BIL SIJ                PT Education - 05/14/15 1008    Education provided Yes   Education Details HEP   Person(s) Educated Patient   Methods Explanation;Demonstration;Handout   Comprehension Verbalized understanding          PT Short Term Goals - 05/14/15 1007    PT SHORT TERM GOAL #1   Title Ind with HEP.   Time 2   Period Weeks   Status Achieved           PT Long Term Goals - 05/14/15 1007    PT LONG TERM GOAL #1   Title Sit 30 minutes with pain not > 4/10.   Time 4   Period Weeks   Status On-going   PT LONG TERM GOAL #2   Title Stand 30 minutes with pain not > 4/10.   Time 4   Period Weeks   Status On-going   PT LONG TERM GOAL #3   Title Perform ADL's with pain not > 4/10.   Time 4   Period Weeks   Status On-going               Plan - 05/14/15 1008    Clinical Impression Statement  Patient progressing with all activities. Patient understands HEP and importance of core strengthening and posture techniques to avoid re-injury. Patient tolerated treatment well and reports relief after. STG #1 met other Goals ongoing due to pain limitiations.   Pt will benefit from skilled therapeutic intervention in order to improve on the following deficits Pain;Decreased activity tolerance   Rehab Potential Fair   PT Frequency 2x / week   PT Duration 6 weeks   PT Treatment/Interventions ADLs/Self Care Home Management;Electrical Stimulation;Manual techniques;Therapeutic exercise;Ultrasound   PT Next Visit Plan Continue STW/M to lumbar and lumbo-sacral region prn. Assess goals.  Core exercise as tolerated. [place]. SIJ Taping for support?   Consulted and Agree with Plan of Care  Patient        Problem List Patient Active Problem List   Diagnosis Date Noted  . Epigastric mass 04/16/2015  . Abdominal swelling 04/16/2015  . Mucosal abnormality of stomach   . Nausea without vomiting 02/17/2015  . Loss of weight 02/17/2015  . Chronic folliculitis 1019/50/9326. 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 Insomnia 03/12/2014  . Folliculitis 73/31/2508  . Cirrhosis of liver 11/15/2013  . Lower extremity edema 11/15/2013  . Abdominal pain, chronic, epigastric 07/10/2013  . Anorexia nervosa 07/10/2013  . 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 Muscle weakness (generalized) 12/28/2011  . CARPAL TUNNEL SYNDROME 10/16/2008  . ABDOMINAL BLOATING 10/16/2008  . Alcohol abuse 04/25/2008  . TOBACCO ABUSE 04/25/2008  . POST TRAUMATIC STRESS SYNDROME 04/22/2008  . DEPRESSION 04/22/2008  . INTRACRANIAL HEMORRHAGE 04/22/2008  . Esophageal reflux 04/22/2008  . INTUSSUSCEPTION 04/22/2008  . ABDOMINAL ADHESIONS 04/22/2008  . Hepatic cirrhosis due to chronic hepatitis C infection 04/22/2008  . OBSTRUCTION OF BILE DUCT 04/22/2008  . LOW BACK PAIN, CHRONIC 04/22/2008  . Abdominal pain 04/22/2008  . ABDOMINAL PAIN OTHER SPECIFIED SITE 04/22/2008  . HEPATITIS C, HX OF 04/22/2008    DUNFORD, CHRISTINA P, PTA 05/14/2015, 10:31 AM  [facility] [place], [place], [address] Phone: [phone]   Fax:  [phone]

## 2015-05-15 ENCOUNTER — Other Ambulatory Visit: Payer: Self-pay

## 2015-05-15 DIAGNOSIS — K7460 Unspecified cirrhosis of liver: Secondary | ICD-10-CM

## 2015-05-16 ENCOUNTER — Ambulatory Visit: Payer: Medicare Other | Admitting: Physical Therapy

## 2015-05-16 ENCOUNTER — Telehealth: Payer: Self-pay | Admitting: Gastroenterology

## 2015-05-16 DIAGNOSIS — M545 Low back pain, unspecified: Secondary | ICD-10-CM

## 2015-05-16 NOTE — Therapy (Signed)
[facility] [place], [place], [address] Phone: [phone]   Fax:  [phone]  Physical Therapy Treatment  Patient Details  Name: Sabrina Wyatt MRN: [id number] Date of Birth: 21-Jul-1951 Referring Provider:  Rosita Fire, MD  Encounter Date: 05/16/2015      PT End of Session - 05/16/15 1230    Visit Number 7   Number of Visits 12   Date for PT Re-Evaluation 05/15/15   PT Start Time 1030   PT Stop Time 1118   PT Time Calculation (min) 48 min   Activity Tolerance Patient tolerated treatment well   Behavior During Therapy [facility] for tasks assessed/performed      Past Medical History  Diagnosis Date  . GERD (gastroesophageal reflux disease)   . Cirrhosis, hepatitis C     CT on 03/26/13= no HCC, pt has been vaccinated for Hep A and Hep B. s/p treatment of HCV with eradication  . Depression   . Carpal tunnel syndrome   . S/P endoscopy 08/27/10    antral erosions, otherwise normal, due egd 08/2012 to screen for varices  . Abdominal wall pain     chronic  . Pulmonary nodules   . PTSD (post-traumatic stress disorder)   . Chronic low back pain   . 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 Polysubstance abuse     HX of  . S/P colonoscopy 2005    Dr Moss Mc polyp removed, otherwise normal  . Hypertension   . Tobacco abuse   . Intussusception 04/2012  . Shortness of breath   . Chronic abdominal pain   . Biliary stone   . Seizures 1998    with brain bleed x1. None since then and on no meds    Past Surgical History  Procedure Laterality Date  . Cholecystectomy  2002  . Percutaneous transhepatic cholangiograhpy and billiary drainage  2002  . Roux-en-y-hepatojejunostomy  2003    revision in 2013 for intussusception [facility] Dr. Bailey Mech)  . Spinal fusion      C5-C7  . Carpal tunnel release Bilateral 12/2010  . Esophagogastroduodenoscopy    09/10/2003    Small hiatal hernia; otherwise normal stomach, normal  D1 and D2  . Colonoscopy    09/10/2003    diminutive polyp in the rectum cold biopsied/removed/ Normal colon  . Esophagogastroduodenoscopy  08/27/2010    BDZ:HGDJME esophagus.  No varices.  Couple of tiny antral erosions of doubtful clinical significance, otherwise normal stomach, D1 and D2.  . Esophagogastroduodenoscopy (egd) with propofol N/A 08/02/2013    RMR: Portal gastropathy. No explanation for abdominal pain which I believe is more abdominal wall in origin. She has been seen by Dr. Carlis Abbott  over at [facility]. She is up-to-date on cross sectional imaging. She has a pain management physician in [place]. Repeat EGD for varices in 3 years.  . Colonoscopy with propofol N/A 08/02/2013    QAS:TMHDQQI diverticulosis. next TCS 10 years.  . Biliary stone removal  2015  . Lumbar fusion    . Nose surgery  1970  . Esophagogastroduodenoscopy (egd) with propofol N/A 03/06/2015    WLN:LGXQJJHE gastric mucosac s/p bx  . Esophageal biopsy N/A 03/06/2015    Procedure: BIOPSY;  Surgeon: Daneil Dolin, MD;  Location: [facility];  Service: Endoscopy;  Laterality: N/A;    There were no vitals filed for this visit.  Visit Diagnosis:  Midline low back pain without sciatica      Subjective Assessment - 05/16/15  1226    Subjective Pain is at an 8/10 today.   Pain Score 8    Pain Location Back   Pain Orientation Right;Left;Mid;Lower   Pain Descriptors / Indicators Aching;Sore   Pain Type Chronic pain   Pain Onset More than a month ago   Pain Frequency Constant                                   PT Short Term Goals - 05/14/15 1007    PT SHORT TERM GOAL #1   Title Ind with HEP.   Time 2   Period Weeks   Status Achieved           PT Long Term Goals - 05/14/15 1007    PT LONG TERM GOAL #1   Title Sit 30 minutes with pain not > 4/10.   Time 4   Period Weeks   Status On-going   PT LONG TERM GOAL #2   Title Stand 30 minutes with pain not > 4/10.   Time 4   Period  Weeks   Status On-going   PT LONG TERM GOAL #3   Title Perform ADL's with pain not > 4/10.   Time 4   Period Weeks   Status On-going               Problem List Patient Active Problem List   Diagnosis Date Noted  . Epigastric mass 04/16/2015  . Abdominal swelling 04/16/2015  . Mucosal abnormality of stomach   . Nausea without vomiting 02/17/2015  . Loss of weight 02/17/2015  . Chronic folliculitis 55/73/2202  . 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 Insomnia 03/12/2014  . Folliculitis 54/27/0623  . Cirrhosis of liver 11/15/2013  . Lower extremity edema 11/15/2013  . Abdominal pain, chronic, epigastric 07/10/2013  . Anorexia nervosa 07/10/2013  . 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 Muscle weakness (generalized) 12/28/2011  . CARPAL TUNNEL SYNDROME 10/16/2008  . ABDOMINAL BLOATING 10/16/2008  . Alcohol abuse 04/25/2008  . TOBACCO ABUSE 04/25/2008  . POST TRAUMATIC STRESS SYNDROME 04/22/2008  . DEPRESSION 04/22/2008  . INTRACRANIAL HEMORRHAGE 04/22/2008  . Esophageal reflux 04/22/2008  . INTUSSUSCEPTION 04/22/2008  . ABDOMINAL ADHESIONS 04/22/2008  . Hepatic cirrhosis due to chronic hepatitis C infection 04/22/2008  . OBSTRUCTION OF BILE DUCT 04/22/2008  . LOW BACK PAIN, CHRONIC 04/22/2008  . Abdominal pain 04/22/2008  . ABDOMINAL PAIN OTHER SPECIFIED SITE 04/22/2008  . HEPATITIS C, HX OF 04/22/2008   Treatment:  Prone over 2 pilllows for comfort:  U/S to affected lower lumbar region/SIJ regions x 14 minutes f/b STW/M including sacral ligament work and multifidus release technique x 24 minutes.  Yosmar Ryker, [place]  MPT 05/16/2015, 12:33 PM  [facility] [address] [place], [place], [address] Phone: [phone]   Fax:  [phone]

## 2015-05-16 NOTE — Telephone Encounter (Signed)
Noted and place on recall

## 2015-05-16 NOTE — Telephone Encounter (Signed)
Patient came up for recall for abd u/s for hepatoma screening.   She had CT in 03/2015 therefore will not need abd u/s at this time.   Please cancel abd u/s for now.   Please NIC for abd u/s in 09/2015.

## 2015-05-20 ENCOUNTER — Ambulatory Visit: Payer: Medicare Other | Admitting: Physical Therapy

## 2015-05-20 DIAGNOSIS — M545 Low back pain, unspecified: Secondary | ICD-10-CM

## 2015-05-20 NOTE — Therapy (Signed)
[facility] [place], [place], [address] Phone: [phone]   Fax:  [phone]  Physical Therapy Treatment  Patient Details  Name: Sabrina Wyatt MRN: [id number] Date of Birth: 09-Nov-1950 Referring Provider:  Rosita Fire, MD  Encounter Date: 05/20/2015      PT End of Session - 05/20/15 0950    Visit Number 8   Number of Visits 12   Date for PT Re-Evaluation 05/15/15   PT Start Time 0949   PT Stop Time 1030   PT Time Calculation (min) 41 min   Activity Tolerance Patient tolerated treatment well   Behavior During Therapy [facility] for tasks assessed/performed      Past Medical History  Diagnosis Date  . GERD (gastroesophageal reflux disease)   . Cirrhosis, hepatitis C     CT on 03/26/13= no HCC, pt has been vaccinated for Hep A and Hep B. s/p treatment of HCV with eradication  . Depression   . Carpal tunnel syndrome   . S/P endoscopy 08/27/10    antral erosions, otherwise normal, due egd 08/2012 to screen for varices  . Abdominal wall pain     chronic  . Pulmonary nodules   . PTSD (post-traumatic stress disorder)   . Chronic low back pain   . 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 Polysubstance abuse     HX of  . S/P colonoscopy 2005    Dr Moss Mc polyp removed, otherwise normal  . Hypertension   . Tobacco abuse   . Intussusception 04/2012  . Shortness of breath   . Chronic abdominal pain   . Biliary stone   . Seizures 1998    with brain bleed x1. None since then and on no meds    Past Surgical History  Procedure Laterality Date  . Cholecystectomy  2002  . Percutaneous transhepatic cholangiograhpy and billiary drainage  2002  . Roux-en-y-hepatojejunostomy  2003    revision in 2013 for intussusception [facility] Dr. Bailey Mech)  . Spinal fusion      C5-C7  . Carpal tunnel release Bilateral 12/2010  . Esophagogastroduodenoscopy    09/10/2003    Small hiatal hernia; otherwise normal stomach, normal  D1 and D2  . Colonoscopy    09/10/2003    diminutive polyp in the rectum cold biopsied/removed/ Normal colon  . Esophagogastroduodenoscopy  08/27/2010    OFB:PZWCHE esophagus.  No varices.  Couple of tiny antral erosions of doubtful clinical significance, otherwise normal stomach, D1 and D2.  . Esophagogastroduodenoscopy (egd) with propofol N/A 08/02/2013    RMR: Portal gastropathy. No explanation for abdominal pain which I believe is more abdominal wall in origin. She has been seen by Dr. Carlis Abbott  over at [facility]. She is up-to-date on cross sectional imaging. She has a pain management physician in [place]. Repeat EGD for varices in 3 years.  . Colonoscopy with propofol N/A 08/02/2013    NID:POEUMPN diverticulosis. next TCS 10 years.  . Biliary stone removal  2015  . Lumbar fusion    . Nose surgery  1970  . Esophagogastroduodenoscopy (egd) with propofol N/A 03/06/2015    TIR:WERXVQMG gastric mucosac s/p bx  . Esophageal biopsy N/A 03/06/2015    Procedure: BIOPSY;  Surgeon: Daneil Dolin, MD;  Location: [facility];  Service: Endoscopy;  Laterality: N/A;    There were no vitals filed for this visit.  Visit Diagnosis:  Midline low back pain without sciatica      Subjective Assessment - 05/20/15  0949    Subjective Going back to MD 05/22/2015 for pain management at [facility]   Limitations Sitting;Standing;Walking   How long can you sit comfortably? 15 minutes.   How long can you stand comfortably? 15 minutes.   How long can you walk comfortably? 15 minutes.   Patient Stated Goals Decrease pain.   Pain Score (p) 8    Pain Location (p) Back   Pain Orientation (p) Right;Left;Lower   Pain Descriptors / Indicators (p) Constant;Aching   Pain Type (p) Chronic pain   Pain Onset (p) More than a month ago            [facility] PT Assessment - 05/20/15 0001    Assessment   Medical Diagnosis Back pain.   Next MD Visit 05/22/2015                     [facility] Adult PT  Treatment/Exercise - 05/20/15 0001    Modalities   Modalities Ultrasound   Ultrasound   Ultrasound Location B low back   Ultrasound Parameters 1.5 w/cm2, 50%, 1 mhz x10 min   Ultrasound Goals Pain   Manual Therapy   Manual Therapy Myofascial release   Myofascial Release MFR/TPR to B low back paraspinals, QL, SI joint to decrease tightness and pain                  PT Short Term Goals - 05/14/15 1007    PT SHORT TERM GOAL #1   Title Ind with HEP.   Time 2   Period Weeks   Status Achieved           PT Long Term Goals - 05/14/15 1007    PT LONG TERM GOAL #1   Title Sit 30 minutes with pain not > 4/10.   Time 4   Period Weeks   Status On-going   PT LONG TERM GOAL #2   Title Stand 30 minutes with pain not > 4/10.   Time 4   Period Weeks   Status On-going   PT LONG TERM GOAL #3   Title Perform ADL's with pain not > 4/10.   Time 4   Period Weeks   Status On-going               Plan - 05/20/15 1043    Clinical Impression Statement Patient presented today with increased lumbar musculature tightness and pain. Patient tolerated treatment well today in prone and continues to wear brace to assist in holding core in. Normal modaliities response noted following removal of the modalities. Experienced 5/10 pain following treatment.   Pt will benefit from skilled therapeutic intervention in order to improve on the following deficits Pain;Decreased activity tolerance   Rehab Potential Fair   PT Frequency 2x / week   PT Duration 6 weeks   PT Treatment/Interventions ADLs/Self Care Home Management;Electrical Stimulation;Manual techniques;Therapeutic exercise;Ultrasound   PT Next Visit Plan Continue STW/M to lumbar and lumbo-sacral region prn. Assess goals.  Core exercise as tolerated. [place] prn. SIJ Taping for support?   Consulted and Agree with Plan of Care Patient        Problem List Patient Active Problem List   Diagnosis Date Noted  . Epigastric mass  04/16/2015  . Abdominal swelling 04/16/2015  . Mucosal abnormality of stomach   . Nausea without vomiting 02/17/2015  . Loss of weight 02/17/2015  . Chronic folliculitis 48/18/5631  . 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 Insomnia 03/12/2014  . Folliculitis 49/70/2637  .  Cirrhosis of liver 11/15/2013  . Lower extremity edema 11/15/2013  . Abdominal pain, chronic, epigastric 07/10/2013  . Anorexia nervosa 07/10/2013  . 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 Muscle weakness (generalized) 12/28/2011  . CARPAL TUNNEL SYNDROME 10/16/2008  . ABDOMINAL BLOATING 10/16/2008  . Alcohol abuse 04/25/2008  . TOBACCO ABUSE 04/25/2008  . POST TRAUMATIC STRESS SYNDROME 04/22/2008  . DEPRESSION 04/22/2008  . INTRACRANIAL HEMORRHAGE 04/22/2008  . Esophageal reflux 04/22/2008  . INTUSSUSCEPTION 04/22/2008  . ABDOMINAL ADHESIONS 04/22/2008  . Hepatic cirrhosis due to chronic hepatitis C infection 04/22/2008  . OBSTRUCTION OF BILE DUCT 04/22/2008  . LOW BACK PAIN, CHRONIC 04/22/2008  . Abdominal pain 04/22/2008  . ABDOMINAL PAIN OTHER SPECIFIED SITE 04/22/2008  . HEPATITIS C, HX OF 04/22/2008    Wynelle Fanny, PTA 05/20/2015, 10:47 AM  [facility] [address] [place], [place], [address] Phone: [phone]   Fax:  [phone]

## 2015-05-21 DIAGNOSIS — J449 Chronic obstructive pulmonary disease, unspecified: Secondary | ICD-10-CM | POA: Diagnosis not present

## 2015-05-21 DIAGNOSIS — I10 Essential (primary) hypertension: Secondary | ICD-10-CM | POA: Diagnosis not present

## 2015-05-21 DIAGNOSIS — F329 Major depressive disorder, single episode, unspecified: Secondary | ICD-10-CM | POA: Diagnosis not present

## 2015-05-21 DIAGNOSIS — F17200 Nicotine dependence, unspecified, uncomplicated: Secondary | ICD-10-CM | POA: Diagnosis not present

## 2015-05-21 DIAGNOSIS — Z23 Encounter for immunization: Secondary | ICD-10-CM | POA: Diagnosis not present

## 2015-05-22 DIAGNOSIS — B1920 Unspecified viral hepatitis C without hepatic coma: Secondary | ICD-10-CM | POA: Diagnosis not present

## 2015-05-22 DIAGNOSIS — M069 Rheumatoid arthritis, unspecified: Secondary | ICD-10-CM | POA: Diagnosis not present

## 2015-05-22 DIAGNOSIS — F329 Major depressive disorder, single episode, unspecified: Secondary | ICD-10-CM | POA: Diagnosis not present

## 2015-05-22 DIAGNOSIS — G894 Chronic pain syndrome: Secondary | ICD-10-CM | POA: Diagnosis not present

## 2015-05-22 DIAGNOSIS — R1084 Generalized abdominal pain: Secondary | ICD-10-CM | POA: Diagnosis not present

## 2015-05-22 DIAGNOSIS — F17210 Nicotine dependence, cigarettes, uncomplicated: Secondary | ICD-10-CM | POA: Diagnosis not present

## 2015-05-22 DIAGNOSIS — S3613XS Injury of bile duct, sequela: Secondary | ICD-10-CM | POA: Diagnosis not present

## 2015-05-22 DIAGNOSIS — K7469 Other cirrhosis of liver: Secondary | ICD-10-CM | POA: Diagnosis not present

## 2015-05-22 DIAGNOSIS — F4310 Post-traumatic stress disorder, unspecified: Secondary | ICD-10-CM | POA: Diagnosis not present

## 2015-05-22 DIAGNOSIS — K219 Gastro-esophageal reflux disease without esophagitis: Secondary | ICD-10-CM | POA: Diagnosis not present

## 2015-05-22 DIAGNOSIS — Z5181 Encounter for therapeutic drug level monitoring: Secondary | ICD-10-CM | POA: Diagnosis not present

## 2015-05-22 DIAGNOSIS — I10 Essential (primary) hypertension: Secondary | ICD-10-CM | POA: Diagnosis not present

## 2015-05-22 DIAGNOSIS — Z79899 Other long term (current) drug therapy: Secondary | ICD-10-CM | POA: Diagnosis not present

## 2015-05-23 ENCOUNTER — Encounter: Payer: Self-pay | Admitting: Physical Therapy

## 2015-05-26 ENCOUNTER — Other Ambulatory Visit (HOSPITAL_COMMUNITY): Payer: Self-pay

## 2015-05-27 ENCOUNTER — Encounter: Payer: Self-pay | Admitting: Internal Medicine

## 2015-05-27 ENCOUNTER — Ambulatory Visit (INDEPENDENT_AMBULATORY_CARE_PROVIDER_SITE_OTHER): Payer: Medicare Other | Admitting: Internal Medicine

## 2015-05-27 VITALS — BP 132/82 | HR 85 | Temp 98.1°F | Ht 65.0 in | Wt 147.6 lb

## 2015-05-27 DIAGNOSIS — K7460 Unspecified cirrhosis of liver: Secondary | ICD-10-CM | POA: Diagnosis not present

## 2015-05-27 DIAGNOSIS — R1013 Epigastric pain: Secondary | ICD-10-CM

## 2015-05-27 DIAGNOSIS — K219 Gastro-esophageal reflux disease without esophagitis: Secondary | ICD-10-CM

## 2015-05-27 DIAGNOSIS — R109 Unspecified abdominal pain: Secondary | ICD-10-CM

## 2015-05-27 DIAGNOSIS — G8929 Other chronic pain: Secondary | ICD-10-CM

## 2015-05-27 NOTE — Progress Notes (Signed)
Primary Care Physician:  Rosita Fire, MD Primary Gastroenterologist:  Dr. Gala Romney  Pre-Procedure History & Physical: HPI:  Sabrina Wyatt is a 64 y.o. female here for follow-up of chronic abdominal pain. History of hep C cirrhosis;  status post eradication. Bile duct injury requiring hepaticojejunostomy previously. Complicated course. Chronic abdominal pain. 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 Surgery by Dr. Bailey Mech. Seen back by Dr. Carlis Abbott recently. No apparent surgical issues. Also, has seen Dr. Elly Modena, her new pain management physician. She's currently on Percocet. Also saw a plastic surgeon regarding possible surgery for her abdominal wall scars. Surgery was not recommended. Patient denies any nausea, vomiting or fever. No melena, rectal bleeding. No evidence of hepatoma on prior CT's.  GERD symptoms well controlled on Prilosec. Weight down 4 pounds and she was last seen.  Past Medical History  Diagnosis Date  . GERD (gastroesophageal reflux disease)   . Cirrhosis, hepatitis C     CT on 04/21/15= no HCC, pt has been vaccinated for Hep A and Hep B. s/p treatment of HCV with eradication  . Depression   . Carpal tunnel syndrome   . S/P endoscopy 08/27/10    antral erosions, otherwise normal, due egd 08/2012 to screen for varices  . Abdominal wall pain     chronic  . Pulmonary nodules   . PTSD (post-traumatic stress disorder)   . Chronic low back pain   . 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 Polysubstance abuse     HX of  . S/P colonoscopy 2005    Dr Moss Mc polyp removed, otherwise normal  . Hypertension   . Tobacco abuse   . Intussusception ([place]) 04/2012  . Shortness of breath   . Chronic abdominal pain   . Biliary stone   . Seizures ([place]) 1998    with brain bleed x1. None since then and on no meds    Past Surgical History  Procedure Laterality Date  . Cholecystectomy  2002  .  Percutaneous transhepatic cholangiograhpy and billiary drainage  2002  . Roux-en-y-hepatojejunostomy  2003    revision in 2013 for intussusception [facility] Dr. Bailey Mech)  . Spinal fusion      C5-C7  . Carpal tunnel release Bilateral 12/2010  . Esophagogastroduodenoscopy    09/10/2003    Small hiatal hernia; otherwise normal stomach, normal D1 and D2  . Colonoscopy    09/10/2003    diminutive polyp in the rectum cold biopsied/removed/ Normal colon  . Esophagogastroduodenoscopy  08/27/2010    TDV:VOHYWV esophagus.  No varices.  Couple of tiny antral erosions of doubtful clinical significance, otherwise normal stomach, D1 and D2.  . Esophagogastroduodenoscopy (egd) with propofol N/A 08/02/2013    RMR: Portal gastropathy. No explanation for abdominal pain which I believe is more abdominal wall in origin. She has been seen by Dr. Carlis Abbott  over at [facility]. She is up-to-date on cross sectional imaging. She has a pain management physician in [place]. Repeat EGD for varices in 3 years.  . Colonoscopy with propofol N/A 08/02/2013    PXT:GGYIRSW diverticulosis. next TCS 10 years.  . Biliary stone removal  2015  . Lumbar fusion    . Nose surgery  1970  . Esophagogastroduodenoscopy (egd) with propofol N/A 03/06/2015    NIO:EVOJJKKX gastric mucosac s/p bx  . Esophageal biopsy N/A 03/06/2015    Procedure: BIOPSY;  Surgeon: Daneil Dolin, MD;  Location: [facility];  Service:  Endoscopy;  Laterality: N/A;    Prior to Admission medications   Medication Sig Start Date End Date Taking? Authorizing Provider  acyclovir (ZOVIRAX) 400 MG tablet TAKE AS DIRECTED. 05/01/15  Yes Jonnie Kind, MD  albuterol (PROVENTIL) (2.5 MG/3ML) 0.083% nebulizer solution Take 2.5 mg by nebulization every 6 (six) hours as needed for wheezing or shortness of breath.   Yes Historical Provider, MD  atenolol (TENORMIN) 25 MG tablet Take 25 mg by mouth daily.    Yes Historical Provider, MD  Cholecalciferol (VITAMIN D-3) 1000 UNITS  CAPS Take 1,000 capsules by mouth daily.   Yes Historical Provider, MD  cloNIDine (CATAPRES) 0.1 MG tablet Take 0.1 mg by mouth every 8 (eight) hours as needed (for withdrawal symptoms).   Yes Historical Provider, MD  DULoxetine (CYMBALTA) 60 MG capsule Take 60 mg by mouth 2 (two) times daily.    Yes Historical Provider, MD  EPIPEN 2-PAK 0.3 MG/0.3ML SOAJ injection Inject 0.3 mg into the skin as needed (allergic reactions).  03/04/13  Yes Historical Provider, MD  etodolac (LODINE) 400 MG tablet Take 400 mg by mouth 2 (two) times daily.  04/13/12  Yes Historical Provider, MD  fluticasone (FLONASE) 50 MCG/ACT nasal spray Place 2 sprays into the nose daily.     Yes Historical Provider, MD  loratadine (CLARITIN) 10 MG tablet Take 10 mg by mouth daily.   Yes Historical Provider, MD  Omega-3 Fatty Acids (FISH OIL PO) Take 2 capsules by mouth daily.    Yes Historical Provider, MD  omeprazole (PRILOSEC) 20 MG capsule TAKE ONE CAPSULE BY MOUTH EVERY DAY 09/10/12  Yes Andria Meuse, NP  Oxycodone HCl 10 MG TABS Take 1 tablet (10 mg total) by mouth every 6 (six) hours as needed. pain 04/16/15  Yes Mahala Menghini, PA-C  PROAIR HFA 108 (90 BASE) MCG/ACT inhaler Inhale 2 puffs into the lungs every 6 (six) hours as needed for wheezing or shortness of breath.  02/21/12  Yes Historical Provider, MD  promethazine (PHENERGAN) 25 MG tablet Take 25 mg by mouth every 6 (six) hours as needed for nausea or vomiting (for nausea for withdrawals).   Yes Historical Provider, MD  traMADol (ULTRAM) 50 MG tablet Take 50 mg by mouth. 06/21/15 07/21/15 Yes Historical Provider, MD    Allergies as of 05/27/2015  . (No Known Allergies)    Family History  Problem Relation Age of Onset  . Coronary artery disease Brother   . Cancer Sister     lymphoma  . Cancer Father     brain    Social History   Social History  . Marital Status: Single    Spouse Name: N/A  . Number of Children: 1  . Years of Education: N/A   Occupational  History  . disabled    Social History Main Topics  . Smoking status: Current Some Day Smoker -- 0.25 packs/day for 30 years    Types: Cigarettes  . Smokeless tobacco: Never Used     Comment: One pack every 6-7 days  . Alcohol Use: Yes     Comment: occasionally   . Drug Use: No     Comment: remote in teens-marijuana, heroin, cocaine  . Sexual Activity: No   Other Topics Concern  . Not on file   Social History Narrative    Review of Systems: See HPI, otherwise negative ROS  Physical Exam: BP 132/82 mmHg  Pulse 85  Temp(Src) 98.1 F (36.7 C) (Oral)  Ht 5\' 5"  (1.651  m)  Wt 147 lb 9.6 oz (66.951 kg)  BMI 24.56 kg/m2 General:    pleasant and cooperative in NAD Skin:  Intact without significant lesions or rashes. Multiple tattoos. MultipleEyes:  Sclera clear, no icterus.   Conjunctiva pink. Ears:  Normal auditory acuity. Nose:  No deformity, discharge,  or lesions. Mouth:  No deformity or lesions. Neck:  Supple; no masses or thyromegaly. No significant cervical adenopathy. Lungs:  Clear throughout to auscultation.   No wheezes, crackles, or rhonchi. No acute distress. Heart:  Regular rate and rhythm; no murmurs, clicks, rubs,  or gallops. Abdomen: Multiple surgical scars.  Non-distended, normal bowel sounds.  Soft and nontender without appreciable mass or hepatosplenomegaly.  Pulses:  Normal pulses noted. Extremities:  Without clubbing or edema.  Impression:     Complicated GI patient with multiple bowel surgeries. Hep C cirrhosis well compensated status post eradication previously. GERD symptoms well controlled. Big issues he states is chronic abdominal pain. She has established a positive relationship with a pain management physician.  Recommendations:  Continue to follow-up with Dr. Wess Botts  Continue omeprazole daily.  See Dr. Legrand Rams regarding flank pain   We will check a liver ultrasound every 6 months  Office visit in 1 year    Notice: This dictation was  prepared with Dragon dictation along with smaller phrase technology. Any transcriptional errors that result from this process are unintentional and may not be corrected upon review.

## 2015-05-27 NOTE — Patient Instructions (Signed)
Continue to follow-up with Dr. Wess Botts  See Dr. Legrand Rams regarding flank pain   We will check a liver ultrasound every 6 months  Office visit in 1 year

## 2015-06-06 ENCOUNTER — Ambulatory Visit (HOSPITAL_COMMUNITY): Payer: Self-pay

## 2015-06-10 DIAGNOSIS — M545 Low back pain: Secondary | ICD-10-CM | POA: Diagnosis not present

## 2015-06-10 DIAGNOSIS — G8929 Other chronic pain: Secondary | ICD-10-CM | POA: Diagnosis not present

## 2015-06-13 ENCOUNTER — Ambulatory Visit (HOSPITAL_COMMUNITY)
Admission: RE | Admit: 2015-06-13 | Discharge: 2015-06-13 | Disposition: A | Discharge status: Home / Self Care | Payer: Medicare Other | Source: Ambulatory Visit | Attending: Obstetrics and Gynecology | Admitting: Obstetrics and Gynecology

## 2015-06-13 DIAGNOSIS — Z1231 Encounter for screening mammogram for malignant neoplasm of breast: Secondary | ICD-10-CM | POA: Diagnosis not present

## 2015-06-19 DIAGNOSIS — F17200 Nicotine dependence, unspecified, uncomplicated: Secondary | ICD-10-CM | POA: Diagnosis not present

## 2015-06-19 DIAGNOSIS — I10 Essential (primary) hypertension: Secondary | ICD-10-CM | POA: Diagnosis not present

## 2015-06-19 DIAGNOSIS — F329 Major depressive disorder, single episode, unspecified: Secondary | ICD-10-CM | POA: Diagnosis not present

## 2015-06-19 DIAGNOSIS — J449 Chronic obstructive pulmonary disease, unspecified: Secondary | ICD-10-CM | POA: Diagnosis not present

## 2015-06-30 ENCOUNTER — Ambulatory Visit: Payer: Self-pay | Admitting: Physical Therapy

## 2015-07-02 ENCOUNTER — Ambulatory Visit (INDEPENDENT_AMBULATORY_CARE_PROVIDER_SITE_OTHER): Payer: Medicare Other | Admitting: Gastroenterology

## 2015-07-02 ENCOUNTER — Encounter: Payer: Self-pay | Admitting: Gastroenterology

## 2015-07-02 VITALS — BP 139/96 | HR 93 | Temp 98.3°F | Ht 65.0 in | Wt 149.2 lb

## 2015-07-02 DIAGNOSIS — K7460 Unspecified cirrhosis of liver: Secondary | ICD-10-CM | POA: Diagnosis not present

## 2015-07-02 DIAGNOSIS — R143 Flatulence: Secondary | ICD-10-CM

## 2015-07-02 DIAGNOSIS — R1084 Generalized abdominal pain: Secondary | ICD-10-CM | POA: Diagnosis not present

## 2015-07-02 DIAGNOSIS — R142 Eructation: Secondary | ICD-10-CM

## 2015-07-02 DIAGNOSIS — B182 Chronic viral hepatitis C: Secondary | ICD-10-CM

## 2015-07-02 DIAGNOSIS — R141 Gas pain: Secondary | ICD-10-CM

## 2015-07-02 MED ORDER — OXYCODONE HCL 10 MG PO TABS: 10.0000 mg | ORAL_TABLET | ORAL | Status: DC | PRN

## 2015-07-02 NOTE — Progress Notes (Signed)
Primary Care Physician: Rosita Fire, MD  Primary Gastroenterologist:  Garfield Cornea, MD   Chief Complaint  Patient presents with  . Follow-up    HPI: Sabrina Wyatt is a 64 y.o. female here for same day visit. She has h/o Cirrhosis (previous HCV s/p eradication), bile duct injury requiring hepaticojejunostomy previously. Chronic abdominal pain. 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 Surgery by Dr. Bailey Mech. Seen back by Dr. Carlis Abbott recently. No apparent surgical issues.   Patient continues to complain of chronic abdominal pain. Abdominal pain more constant and poorly controlled. She also has one month h/o intermittent left flank pain. Feels like she has to go to bathroom but on "dribbles". No gross hematuria. No dysuria. Treated for UTI two months ago and flank pain resolved. BM 2-3 per day, [place] 3-4.   Recent hand and feet swelling. She is concerned about her current pain management regimen. She is on oxycodone 10mg  BID but her pain is not managed. She is supposed to take tramadol for breakthrough pain but did not tolerate due to nightmares. Doesn't like the percocet due to the tylenol given her cirrhosis.     [facility], [address] [place], fax [phone]  Current Outpatient Prescriptions  Medication Sig Dispense Refill  . acyclovir (ZOVIRAX) 400 MG tablet TAKE AS DIRECTED. 40 tablet 11  . albuterol (PROVENTIL) (2.5 MG/3ML) 0.083% nebulizer solution Take 2.5 mg by nebulization every 6 (six) hours as needed for wheezing or shortness of breath.    Marland Kitchen atenolol (TENORMIN) 25 MG tablet Take 25 mg by mouth daily.     . Cholecalciferol (VITAMIN D-3) 1000 UNITS CAPS Take 1,000 capsules by mouth daily.    . cloNIDine (CATAPRES) 0.1 MG tablet Take 0.1 mg by mouth every 8 (eight) hours as needed (for withdrawal symptoms).    . DULoxetine (CYMBALTA) 60 MG capsule Take 60 mg by mouth 2 (two)  times daily.     Marland Kitchen EPIPEN 2-PAK 0.3 MG/0.3ML SOAJ injection Inject 0.3 mg into the skin as needed (allergic reactions).     Marland Kitchen etodolac (LODINE) 400 MG tablet Take 400 mg by mouth 2 (two) times daily.     . fluticasone (FLONASE) 50 MCG/ACT nasal spray Place 2 sprays into the nose daily.      Marland Kitchen loratadine (CLARITIN) 10 MG tablet Take 10 mg by mouth daily.    . Omega-3 Fatty Acids (FISH OIL PO) Take 2 capsules by mouth daily.     Marland Kitchen omeprazole (PRILOSEC) 20 MG capsule TAKE ONE CAPSULE BY MOUTH EVERY DAY 30 capsule 11  . oxyCODONE-acetaminophen (PERCOCET) 10-325 MG tablet Take 1 tablet by mouth.    Marland Kitchen PROAIR HFA 108 (90 BASE) MCG/ACT inhaler Inhale 2 puffs into the lungs every 6 (six) hours as needed for wheezing or shortness of breath.     . promethazine (PHENERGAN) 25 MG tablet Take 25 mg by mouth every 6 (six) hours as needed for nausea or vomiting (for nausea for withdrawals).    . traMADol (ULTRAM) 50 MG tablet Take 50 mg by mouth.     No current facility-administered medications for this visit.    Allergies as of 07/02/2015  . (No Known Allergies)    ROS:  General: Negative for anorexia, weight loss, fever, chills, fatigue, weakness. ENT: Negative for hoarseness, difficulty swallowing , nasal congestion. CV: Negative for chest pain, angina, palpitations, dyspnea on exertion, peripheral edema.  Respiratory: Negative for dyspnea at  rest, dyspnea on exertion, cough, sputum, wheezing.  GI: See history of present illness. GU:  Negative for dysuria, hematuria, urinary incontinence, urinary frequency, nocturnal urination.  Endo: Negative for unusual weight change.    Physical Examination:   BP 139/96 mmHg  Pulse 93  Temp(Src) 98.3 F (36.8 C)  Ht 5\' 5"  (1.651 m)  Wt 149 lb 3.2 oz (67.677 kg)  BMI 24.83 kg/m2  General: Well-nourished, well-developed in no acute distress.  Eyes: No icterus. Mouth: Oropharyngeal mucosa moist and pink , no lesions erythema or exudate. Lungs: Clear to  auscultation bilaterally.  Heart: Regular rate and rhythm, no murmurs rubs or gallops.  Abdomen: Bowel sounds are normal, mild epigastric tenderness with palpable fullness which is tender, stable., nondistended, no abdominal bruits or hernia , no rebound or guarding.  No cva tenderness Extremities: No lower extremity edema. No clubbing or deformities. Neuro: Alert and oriented x 4   Skin: Warm and dry, no jaundice.   Psych: Alert and cooperative, normal mood and affect.    Imaging Studies: Mm Screening Breast Tomo Bilateral  06/16/2015  CLINICAL DATA:  Screening. EXAM: DIGITAL SCREENING BILATERAL MAMMOGRAM WITH 3D TOMO WITH CAD COMPARISON:  Previous exam(s). ACR Breast Density Category b: There are scattered areas of fibroglandular density. FINDINGS: There are no findings suspicious for malignancy. Images were processed with CAD. IMPRESSION: No mammographic evidence of malignancy. A result letter of this screening mammogram will be mailed directly to the patient. RECOMMENDATION: Screening mammogram in one year. (Code:SM-B-01Y) BI-RADS CATEGORY  1: Negative. Electronically Signed   By: Lillia Mountain M.D.   On: 06/16/2015 12:14   Impression/Plan:  64 y/o female with cirrhosis (previous HCV s/p eradication), chronic abdominal pain with h/o multiple abdominal surgeries due to complicated gb disease who presents with ongoing left flank pain. Check U/A with culture. Plan on labs. Patient has concerns about current pain medication regimen. She previously did well on oxycodone 10mg  every 4-6 hours daily (no more than 5 daily). Currently on oxycodone ER 10mg  BID and Percocet prn. She really should limit her acetaminophen in setting of cirrhosis. She has also noted some peripheral edema more recently.   Will follow up on urine studies and labs. She asked that I write a letter on her behalf regarding pain management. 7 day supply of oxycodone provided. Further recommendations to follow.

## 2015-07-02 NOTE — Patient Instructions (Signed)
1. Please have your labs and urine test done. 2. I will fax a letter to your pain management clinic on your behalf in the next couple days.

## 2015-07-06 ENCOUNTER — Encounter: Payer: Self-pay | Admitting: Gastroenterology

## 2015-07-07 ENCOUNTER — Ambulatory Visit: Payer: Self-pay | Admitting: Physical Therapy

## 2015-07-07 ENCOUNTER — Other Ambulatory Visit: Payer: Self-pay

## 2015-07-07 DIAGNOSIS — B182 Chronic viral hepatitis C: Secondary | ICD-10-CM

## 2015-07-07 DIAGNOSIS — K7460 Unspecified cirrhosis of liver: Secondary | ICD-10-CM

## 2015-07-07 NOTE — Progress Notes (Signed)
cc'ed to pcp °

## 2015-07-08 ENCOUNTER — Ambulatory Visit: Payer: Self-pay | Admitting: Gastroenterology

## 2015-07-09 ENCOUNTER — Telehealth: Payer: Self-pay

## 2015-07-09 DIAGNOSIS — R52 Pain, unspecified: Secondary | ICD-10-CM | POA: Diagnosis not present

## 2015-07-09 DIAGNOSIS — R1084 Generalized abdominal pain: Secondary | ICD-10-CM | POA: Diagnosis not present

## 2015-07-09 DIAGNOSIS — L905 Scar conditions and fibrosis of skin: Secondary | ICD-10-CM | POA: Diagnosis not present

## 2015-07-09 NOTE — Telephone Encounter (Signed)
I will declined to refill. She can always call her primary care physician

## 2015-07-09 NOTE — Telephone Encounter (Signed)
Dr.Rourk- this pt is not able to get her pain meds from her current pain clinic. She is being referred to another pain clinic. She wants to know if we can write her an rx to hold her over until her appointment with them?

## 2015-07-09 NOTE — Telephone Encounter (Signed)
Pt is aware.  

## 2015-07-09 NOTE — Telephone Encounter (Signed)
Pt called- she went to pain management today and they will not refill her pain medication. She is requesting a refill from [facility], she also wants a referral to [facility] in [place], the number is [phone].   She is requesting that [facility] call her today at [phone], she said she needed to speak with her.

## 2015-07-09 NOTE — Telephone Encounter (Signed)
No

## 2015-07-09 NOTE — Telephone Encounter (Signed)
Please get referral to pain clinic per her choice.  Please discuss request for more pain medication with Dr. Gala Romney. We cannot supply her with chronic pain medication but if he wants to write her for temporary supply, I will assist with that under his recommendations.

## 2015-07-10 ENCOUNTER — Emergency Department (HOSPITAL_COMMUNITY): Payer: Medicare Other

## 2015-07-10 ENCOUNTER — Emergency Department (HOSPITAL_COMMUNITY)
Admission: EM | Admit: 2015-07-10 | Discharge: 2015-07-10 | Disposition: A | Discharge status: Home / Self Care | Payer: Medicare Other | Attending: Emergency Medicine | Admitting: Emergency Medicine

## 2015-07-10 ENCOUNTER — Encounter (HOSPITAL_COMMUNITY): Payer: Self-pay

## 2015-07-10 DIAGNOSIS — Z791 Long term (current) use of non-steroidal anti-inflammatories (NSAID): Secondary | ICD-10-CM | POA: Diagnosis not present

## 2015-07-10 DIAGNOSIS — Z7951 Long term (current) use of inhaled steroids: Secondary | ICD-10-CM | POA: Diagnosis not present

## 2015-07-10 DIAGNOSIS — Z9049 Acquired absence of other specified parts of digestive tract: Secondary | ICD-10-CM | POA: Diagnosis not present

## 2015-07-10 DIAGNOSIS — Z79899 Other long term (current) drug therapy: Secondary | ICD-10-CM | POA: Diagnosis not present

## 2015-07-10 DIAGNOSIS — F4310 Post-traumatic stress disorder, unspecified: Secondary | ICD-10-CM | POA: Insufficient documentation

## 2015-07-10 DIAGNOSIS — R319 Hematuria, unspecified: Secondary | ICD-10-CM | POA: Diagnosis not present

## 2015-07-10 DIAGNOSIS — F329 Major depressive disorder, single episode, unspecified: Secondary | ICD-10-CM | POA: Insufficient documentation

## 2015-07-10 DIAGNOSIS — K219 Gastro-esophageal reflux disease without esophagitis: Secondary | ICD-10-CM | POA: Insufficient documentation

## 2015-07-10 DIAGNOSIS — F17210 Nicotine dependence, cigarettes, uncomplicated: Secondary | ICD-10-CM | POA: Diagnosis not present

## 2015-07-10 DIAGNOSIS — Z8619 Personal history of other infectious and parasitic diseases: Secondary | ICD-10-CM | POA: Insufficient documentation

## 2015-07-10 DIAGNOSIS — G8929 Other chronic pain: Secondary | ICD-10-CM | POA: Insufficient documentation

## 2015-07-10 DIAGNOSIS — R109 Unspecified abdominal pain: Secondary | ICD-10-CM | POA: Diagnosis not present

## 2015-07-10 DIAGNOSIS — I10 Essential (primary) hypertension: Secondary | ICD-10-CM | POA: Insufficient documentation

## 2015-07-10 DIAGNOSIS — M545 Low back pain: Secondary | ICD-10-CM | POA: Insufficient documentation

## 2015-07-10 LAB — CBC WITH DIFFERENTIAL/PLATELET
Basophils Absolute: 0 10*3/uL (ref 0.0–0.1)
Basophils Relative: 0 %
Eosinophils Absolute: 0.2 10*3/uL (ref 0.0–0.7)
Eosinophils Relative: 2 %
HCT: 42.9 % (ref 36.0–46.0)
Hemoglobin: 13.9 g/dL (ref 12.0–15.0)
Lymphocytes Relative: 25 %
Lymphs Abs: 2.6 10*3/uL (ref 0.7–4.0)
MCH: 30.8 pg (ref 26.0–34.0)
MCHC: 32.4 g/dL (ref 30.0–36.0)
MCV: 95.1 fL (ref 78.0–100.0)
Monocytes Absolute: 0.8 10*3/uL (ref 0.1–1.0)
Monocytes Relative: 7 %
Neutro Abs: 7.1 10*3/uL (ref 1.7–7.7)
Neutrophils Relative %: 66 %
Platelets: 154 10*3/uL (ref 150–400)
RBC: 4.51 MIL/uL (ref 3.87–5.11)
RDW: 13.6 % (ref 11.5–15.5)
WBC: 10.8 10*3/uL — ABNORMAL HIGH (ref 4.0–10.5)

## 2015-07-10 LAB — COMPREHENSIVE METABOLIC PANEL
ALT: 21 U/L (ref 14–54)
AST: 24 U/L (ref 15–41)
Albumin: 4.1 g/dL (ref 3.5–5.0)
Alkaline Phosphatase: 85 U/L (ref 38–126)
Anion gap: 7 (ref 5–15)
BUN: 13 mg/dL (ref 6–20)
CO2: 29 mmol/L (ref 22–32)
Calcium: 9.5 mg/dL (ref 8.9–10.3)
Chloride: 106 mmol/L (ref 101–111)
Creatinine, Ser: 0.73 mg/dL (ref 0.44–1.00)
GFR calc Af Amer: >60 mL/min (ref >60)
GFR calc non Af Amer: >60 mL/min (ref >60)
Glucose, Bld: 97 mg/dL (ref 65–99)
Potassium: 4.1 mmol/L (ref 3.5–5.1)
Sodium: 142 mmol/L (ref 135–145)
Total Bilirubin: 0.6 mg/dL (ref 0.3–1.2)
Total Protein: 7.4 g/dL (ref 6.5–8.1)

## 2015-07-10 LAB — URINE MICROSCOPIC-ADD ON

## 2015-07-10 LAB — URINALYSIS, ROUTINE W REFLEX MICROSCOPIC
Glucose, UA: NEGATIVE mg/dL
Leukocytes, UA: NEGATIVE
Nitrite: NEGATIVE
Specific Gravity, Urine: >1.03 — ABNORMAL HIGH (ref 1.005–1.030)
pH: 5.5 (ref 5.0–8.0)

## 2015-07-10 LAB — RAPID URINE DRUG SCREEN, HOSP PERFORMED
Amphetamines: NOT DETECTED
Barbiturates: NOT DETECTED
Benzodiazepines: POSITIVE — AB
Cocaine: NOT DETECTED
Opiates: NOT DETECTED
Tetrahydrocannabinol: NOT DETECTED

## 2015-07-10 MED ORDER — OXYCODONE-ACETAMINOPHEN 5-325 MG PO TABS: 1.0000 | ORAL_TABLET | Freq: Four times a day (QID) | ORAL | Status: DC | PRN

## 2015-07-10 NOTE — ED Notes (Signed)
Discharge instructions given, pt demonstrated teach back and verbal understanding. No concerns voiced.  

## 2015-07-10 NOTE — ED Notes (Signed)
Pt c/o dull and aching lower mid pain back pain that radiates to left side x 1 week. Pt denies urinary symptoms. Pt's urine does appear amber in color. Pt denies fever, nausea and vomiting. Pt ambulatory to ED room.

## 2015-07-10 NOTE — ED Notes (Signed)
Pt reports left flank pain for the past couple of months.  Reports has chronic abd pain from gallbladder surgery in 2012 and is in pain management.  Pt says this pain is different.  Pt reports is out of her pain medication.

## 2015-07-10 NOTE — ED Provider Notes (Signed)
CSN: [id number]     Arrival date & time 07/10/15  1420 History   First MD Initiated Contact with Patient 07/10/15 1715     Chief Complaint  Patient presents with  . Flank Pain     (Consider location/radiation/quality/duration/timing/severity/associated sxs/prior Treatment) Patient is a 64 y.o. female presenting with flank pain. The history is provided by the patient (The patient complains of low back pain. She has run out of her pain medicine.).  Flank Pain This is a recurrent problem. The current episode started 12 to 24 hours ago. The problem occurs constantly. The problem has not changed since onset.Pertinent negatives include no chest pain, no abdominal pain and no headaches. Nothing aggravates the symptoms. Nothing relieves the symptoms.    Past Medical History  Diagnosis Date  . GERD (gastroesophageal reflux disease)   . Cirrhosis, hepatitis C     CT on 04/21/15= no HCC, pt has been vaccinated for Hep A and Hep B. s/p treatment of HCV with eradication  . Depression   . Carpal tunnel syndrome   . S/P endoscopy 08/27/10    antral erosions, otherwise normal, due egd 08/2012 to screen for varices  . Abdominal wall pain     chronic  . Pulmonary nodules   . PTSD (post-traumatic stress disorder)   . Chronic low back pain   . 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 Polysubstance abuse     HX of  . S/P colonoscopy 2005    Dr Moss Mc polyp removed, otherwise normal  . Hypertension   . Tobacco abuse   . Intussusception ([place]) 04/2012  . Shortness of breath   . Chronic abdominal pain   . Biliary stone   . Seizures ([place]) 1998    with brain bleed x1. None since then and on no meds   Past Surgical History  Procedure Laterality Date  . Cholecystectomy  2002  . Percutaneous transhepatic cholangiograhpy and billiary drainage  2002  . Roux-en-y-hepatojejunostomy  2003    revision in 2013 for intussusception [facility] Dr. Bailey Mech)  . Spinal fusion      C5-C7  .  Carpal tunnel release Bilateral 12/2010  . Esophagogastroduodenoscopy    09/10/2003    Small hiatal hernia; otherwise normal stomach, normal D1 and D2  . Colonoscopy    09/10/2003    diminutive polyp in the rectum cold biopsied/removed/ Normal colon  . Esophagogastroduodenoscopy  08/27/2010    [id number] esophagus.  No varices.  Couple of tiny antral erosions of doubtful clinical significance, otherwise normal stomach, D1 and D2.  . Esophagogastroduodenoscopy (egd) with propofol N/A 08/02/2013    RMR: Portal gastropathy. No explanation for abdominal pain which I believe is more abdominal wall in origin. She has been seen by Dr. Carlis Abbott  over at [facility]. She is up-to-date on cross sectional imaging. She has a pain management physician in [place]. Repeat EGD for varices in 3 years.  . Colonoscopy with propofol N/A 08/02/2013    [id number] diverticulosis. next TCS 10 years.  . Biliary stone removal  2015  . Lumbar fusion    . Nose surgery  1970  . Esophagogastroduodenoscopy (egd) with propofol N/A 03/06/2015    [id number] gastric mucosac s/p bx  . Esophageal biopsy N/A 03/06/2015    Procedure: BIOPSY;  Surgeon: Daneil Dolin, MD;  Location: [facility];  Service: Endoscopy;  Laterality: N/A;   Family History  Problem Relation Age of Onset  . Coronary artery disease Brother   . Cancer  Sister     lymphoma  . Cancer Father     brain   Social History  Substance Use Topics  . Smoking status: Current Some Day Smoker -- 0.25 packs/day for 30 years    Types: Cigarettes  . Smokeless tobacco: Never Used     Comment: One pack every 6-7 days  . Alcohol Use: Yes     Comment: occasionally    OB History    No data available     Review of Systems  Constitutional: Negative for appetite change and fatigue.  HENT: Negative for congestion, ear discharge and sinus pressure.   Eyes: Negative for discharge.  Respiratory: Negative for cough.   Cardiovascular: Negative for chest pain.   Gastrointestinal: Negative for abdominal pain and diarrhea.  Genitourinary: Positive for flank pain. Negative for frequency and hematuria.  Musculoskeletal: Negative for back pain.  Skin: Negative for rash.  Neurological: Negative for seizures and headaches.  Psychiatric/Behavioral: Negative for hallucinations.      Allergies  Bee venom and Tylenol  Home Medications   Prior to Admission medications   Medication Sig Start Date End Date Taking? Authorizing Provider  acyclovir (ZOVIRAX) 400 MG tablet TAKE AS DIRECTED. Patient taking differently: TAKE ONE TABLET BY MOUTH TWICE DAILY 05/01/15  Yes Jonnie Kind, MD  albuterol (PROVENTIL) (2.5 MG/3ML) 0.083% nebulizer solution Take 2.5 mg by nebulization 2 (two) times daily.    Yes Historical Provider, MD  atenolol (TENORMIN) 25 MG tablet Take 25 mg by mouth daily.    Yes Historical Provider, MD  Cholecalciferol (VITAMIN D-3) 1000 UNITS CAPS Take 1,000 capsules by mouth daily.   Yes Historical Provider, MD  DULoxetine (CYMBALTA) 60 MG capsule Take 60 mg by mouth 2 (two) times daily.    Yes Historical Provider, MD  EPIPEN 2-PAK 0.3 MG/0.3ML SOAJ injection Inject 0.3 mg into the skin as needed (allergic reactions).  03/04/13  Yes Historical Provider, MD  etodolac (LODINE) 400 MG tablet Take 400 mg by mouth 2 (two) times daily.  04/13/12  Yes Historical Provider, MD  fluticasone (FLONASE) 50 MCG/ACT nasal spray Place 2 sprays into the nose daily.     Yes Historical Provider, MD  gentamicin (GARAMYCIN) 0.3 % ophthalmic solution Place 1 drop into both eyes daily as needed (FOR ITCHY EYES).  07/03/15  Yes Historical Provider, MD  loratadine (CLARITIN) 10 MG tablet Take 10 mg by mouth daily.   Yes Historical Provider, MD  Omega-3 Fatty Acids (FISH OIL PO) Take 2 capsules by mouth daily.    Yes Historical Provider, MD  omeprazole (PRILOSEC) 20 MG capsule TAKE ONE CAPSULE BY MOUTH EVERY DAY 09/10/12  Yes Andria Meuse, NP  Oxycodone HCl 10 MG TABS  Take 1 tablet (10 mg total) by mouth every 4 (four) hours as needed (do not exceed five daily). 07/02/15  Yes Mahala Menghini, PA-C  PROAIR HFA 108 (90 BASE) MCG/ACT inhaler Inhale 2 puffs into the lungs every 6 (six) hours as needed for wheezing or shortness of breath.  02/21/12  Yes Historical Provider, MD  promethazine (PHENERGAN) 25 MG tablet Take 25 mg by mouth every 6 (six) hours as needed for nausea or vomiting (for nausea for withdrawals).   Yes Historical Provider, MD  oxyCODONE-acetaminophen (PERCOCET/ROXICET) 5-325 MG tablet Take 1 tablet by mouth every 6 (six) hours as needed. 07/10/15   Milton Ferguson, MD   BP 132/82 mmHg  Pulse 80  Temp(Src) 98.5 F (36.9 C) (Oral)  Resp 18  Ht 5'  5" (1.651 m)  Wt 146 lb (66.225 kg)  BMI 24.30 kg/m2  SpO2 98% Physical Exam  Constitutional: She is oriented to person, place, and time. She appears well-developed.  HENT:  Head: Normocephalic.  Eyes: Conjunctivae and EOM are normal. No scleral icterus.  Neck: Neck supple. No thyromegaly present.  Cardiovascular: Normal rate and regular rhythm.  Exam reveals no gallop and no friction rub.   No murmur heard. Pulmonary/Chest: No stridor. She has no wheezes. She has no rales. She exhibits no tenderness.  Abdominal: She exhibits no distension. There is no tenderness. There is no rebound.  Musculoskeletal: Normal range of motion. She exhibits no edema.  Tender lumbar spine.  Lymphadenopathy:    She has no cervical adenopathy.  Neurological: She is oriented to person, place, and time. She exhibits normal muscle tone. Coordination normal.  Skin: No rash noted. No erythema.  Psychiatric: She has a normal mood and affect. Her behavior is normal.    ED Course  Procedures (including critical care time) Labs Review Labs Reviewed  CBC WITH DIFFERENTIAL/PLATELET - Abnormal; Notable for the following:    WBC 10.8 (*)    All other components within normal limits  URINALYSIS, ROUTINE W REFLEX MICROSCOPIC  (NOT AT [facility]) - Abnormal; Notable for the following:    APPearance HAZY (*)    Specific Gravity, Urine >1.030 (*)    Hgb urine dipstick LARGE (*)    Bilirubin Urine MODERATE (*)    Ketones, ur TRACE (*)    Protein, ur TRACE (*)    All other components within normal limits  URINE RAPID DRUG SCREEN, HOSP PERFORMED - Abnormal; Notable for the following:    Benzodiazepines POSITIVE (*)    All other components within normal limits  URINE MICROSCOPIC-ADD ON - Abnormal; Notable for the following:    Squamous Epithelial / LPF 0-5 (*)    Bacteria, UA FEW (*)    All other components within normal limits  URINE CULTURE  COMPREHENSIVE METABOLIC PANEL  HCV RNA QUANT RFLX ULTRA OR GENOTYP    Imaging Review Ct Renal Stone Study  07/10/2015  CLINICAL DATA:  Left flank pain for several months EXAM: CT ABDOMEN AND PELVIS WITHOUT CONTRAST TECHNIQUE: Multidetector CT imaging of the abdomen and pelvis was performed following the standard protocol without IV contrast. COMPARISON:  04/21/2015 FINDINGS: Lung bases are free of acute infiltrate or sizable effusion. Cirrhotic change of the liver is again identified. Pneumobilia is noted related to a prior hepatojejunostomy. No definitive mass lesion is seen. The spleen, adrenal glands and pancreas are all stable in their appearance. Kidneys demonstrate no renal calculi or obstructive changes. The appendix is within normal limits. Minimal diverticular change is noted without evidence of diverticulitis. The bladder is partially distended. No pelvic mass lesion is seen. Postsurgical changes are noted in the lumbar spine. No acute bony abnormality is noted. IMPRESSION: Postsurgical changes.  No acute abnormality noted. Electronically Signed   By: Inez Catalina M.D.   On: 07/10/2015 17:26   I have personally reviewed and evaluated these images and lab results as part of my medical decision-making.   EKG Interpretation None      MDM   Final diagnoses:  Hematuria     Labs unremarkable except for hematuria.   CT scan unremarkable. Patient will have urine cultured. She will be given Percocet for pain which is a medicine she has been taking her chronic back pain. She will follow-up with her family doctor next week and he will  review the urine culture.    Milton Ferguson, MD 07/10/15 2020

## 2015-07-10 NOTE — Discharge Instructions (Signed)
Follow up with dr. fanta next week 

## 2015-07-11 LAB — HCV RNA QUANT RFLX ULTRA OR GENOTYP
HCV RNA Qnt(log copy/mL): UNDETERMINED log10 IU/mL
HepC Qn: NOT DETECTED IU/mL

## 2015-07-12 LAB — URINE CULTURE
Culture: NO GROWTH
Special Requests: NORMAL

## 2015-07-17 ENCOUNTER — Emergency Department (HOSPITAL_COMMUNITY)
Admission: EM | Admit: 2015-07-17 | Discharge: 2015-07-17 | Disposition: A | Discharge status: Home / Self Care | Payer: Medicare Other | Attending: Emergency Medicine | Admitting: Emergency Medicine

## 2015-07-17 ENCOUNTER — Encounter (HOSPITAL_COMMUNITY): Payer: Self-pay | Admitting: Emergency Medicine

## 2015-07-17 DIAGNOSIS — Z791 Long term (current) use of non-steroidal anti-inflammatories (NSAID): Secondary | ICD-10-CM | POA: Diagnosis not present

## 2015-07-17 DIAGNOSIS — K219 Gastro-esophageal reflux disease without esophagitis: Secondary | ICD-10-CM | POA: Insufficient documentation

## 2015-07-17 DIAGNOSIS — M549 Dorsalgia, unspecified: Secondary | ICD-10-CM

## 2015-07-17 DIAGNOSIS — I10 Essential (primary) hypertension: Secondary | ICD-10-CM | POA: Insufficient documentation

## 2015-07-17 DIAGNOSIS — R109 Unspecified abdominal pain: Secondary | ICD-10-CM | POA: Insufficient documentation

## 2015-07-17 DIAGNOSIS — Z9889 Other specified postprocedural states: Secondary | ICD-10-CM | POA: Insufficient documentation

## 2015-07-17 DIAGNOSIS — M545 Low back pain: Secondary | ICD-10-CM | POA: Diagnosis not present

## 2015-07-17 DIAGNOSIS — F17210 Nicotine dependence, cigarettes, uncomplicated: Secondary | ICD-10-CM | POA: Diagnosis not present

## 2015-07-17 DIAGNOSIS — Z8619 Personal history of other infectious and parasitic diseases: Secondary | ICD-10-CM | POA: Insufficient documentation

## 2015-07-17 DIAGNOSIS — Z7951 Long term (current) use of inhaled steroids: Secondary | ICD-10-CM | POA: Insufficient documentation

## 2015-07-17 DIAGNOSIS — Z79899 Other long term (current) drug therapy: Secondary | ICD-10-CM | POA: Diagnosis not present

## 2015-07-17 DIAGNOSIS — G8929 Other chronic pain: Secondary | ICD-10-CM | POA: Diagnosis not present

## 2015-07-17 DIAGNOSIS — F329 Major depressive disorder, single episode, unspecified: Secondary | ICD-10-CM | POA: Insufficient documentation

## 2015-07-17 HISTORY — DX: Other chronic pain: G89.29

## 2015-07-17 HISTORY — DX: Unspecified abdominal pain: R10.9

## 2015-07-17 MED ORDER — OXYCODONE HCL 10 MG PO TABS: ORAL_TABLET | ORAL | Status: DC

## 2015-07-17 NOTE — ED Provider Notes (Signed)
CSN: [id number]     Arrival date & time 07/17/15  1416 History   First MD Initiated Contact with Patient 07/17/15 1423     Chief Complaint  Patient presents with  . Flank Pain     (Consider location/radiation/quality/duration/timing/severity/associated sxs/prior Treatment) Patient is a 64 y.o. female presenting with flank pain. The history is provided by the patient (The patient complains of back pain. She has chronic problems with her back. She has cirrhosis. She sees which GI doctor Dr.rourke and he states that she can have approximately 35 10 mg oxycodone a week for her pain. She's been referred to pain clinic .).  Flank Pain This is a recurrent problem. The current episode started 12 to 24 hours ago. The problem occurs constantly. The problem has not changed since onset.Pertinent negatives include no chest pain, no abdominal pain and no headaches. Nothing aggravates the symptoms. Nothing relieves the symptoms.   patient does not have an appointment with the pain clinic yet. She states that Dr. Legrand Rams is making that arrangement.  Past Medical History  Diagnosis Date  . GERD (gastroesophageal reflux disease)   . Cirrhosis, hepatitis C     CT on 04/21/15= no HCC, pt has been vaccinated for Hep A and Hep B. s/p treatment of HCV with eradication  . Depression   . Carpal tunnel syndrome   . S/P endoscopy 08/27/10    antral erosions, otherwise normal, due egd 08/2012 to screen for varices  . Abdominal wall pain     chronic  . Pulmonary nodules   . PTSD (post-traumatic stress disorder)   . Chronic low back pain   . 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 Polysubstance abuse     HX of  . S/P colonoscopy 2005    Dr Moss Mc polyp removed, otherwise normal  . Hypertension   . Tobacco abuse   . Intussusception ([place]) 04/2012  . Shortness of breath   . Chronic abdominal pain   . Biliary stone   . Seizures ([place]) 1998    with brain bleed x1. None since then and on no meds  .  Chronic flank pain    Past Surgical History  Procedure Laterality Date  . Cholecystectomy  2002  . Percutaneous transhepatic cholangiograhpy and billiary drainage  2002  . Roux-en-y-hepatojejunostomy  2003    revision in 2013 for intussusception [facility] Dr. Bailey Mech)  . Spinal fusion      C5-C7  . Carpal tunnel release Bilateral 12/2010  . Esophagogastroduodenoscopy    09/10/2003    Small hiatal hernia; otherwise normal stomach, normal D1 and D2  . Colonoscopy    09/10/2003    diminutive polyp in the rectum cold biopsied/removed/ Normal colon  . Esophagogastroduodenoscopy  08/27/2010    [id number] esophagus.  No varices.  Couple of tiny antral erosions of doubtful clinical significance, otherwise normal stomach, D1 and D2.  . Esophagogastroduodenoscopy (egd) with propofol N/A 08/02/2013    RMR: Portal gastropathy. No explanation for abdominal pain which I believe is more abdominal wall in origin. She has been seen by Dr. Carlis Abbott  over at [facility]. She is up-to-date on cross sectional imaging. She has a pain management physician in [place]. Repeat EGD for varices in 3 years.  . Colonoscopy with propofol N/A 08/02/2013    [id number] diverticulosis. next TCS 10 years.  . Biliary stone removal  2015  . Lumbar fusion    . Nose surgery  1970  . Esophagogastroduodenoscopy (egd) with propofol  N/A 03/06/2015    [id number] gastric mucosac s/p bx  . Esophageal biopsy N/A 03/06/2015    Procedure: BIOPSY;  Surgeon: Daneil Dolin, MD;  Location: [facility];  Service: Endoscopy;  Laterality: N/A;   Family History  Problem Relation Age of Onset  . Coronary artery disease Brother   . Cancer Sister     lymphoma  . Cancer Father     brain   Social History  Substance Use Topics  . Smoking status: Current Some Day Smoker -- 0.25 packs/day for 30 years    Types: Cigarettes  . Smokeless tobacco: Never Used     Comment: One pack every 6-7 days  . Alcohol Use: Yes     Comment: occasionally     OB History    No data available     Review of Systems  Constitutional: Negative for appetite change and fatigue.  HENT: Negative for congestion, ear discharge and sinus pressure.   Eyes: Negative for discharge.  Respiratory: Negative for cough.   Cardiovascular: Negative for chest pain.  Gastrointestinal: Negative for abdominal pain and diarrhea.  Genitourinary: Positive for flank pain. Negative for frequency and hematuria.  Musculoskeletal: Positive for back pain.  Skin: Negative for rash.  Neurological: Negative for seizures and headaches.  Psychiatric/Behavioral: Negative for hallucinations.      Allergies  Bee venom and Tylenol  Home Medications   Prior to Admission medications   Medication Sig Start Date End Date Taking? Authorizing Provider  acyclovir (ZOVIRAX) 400 MG tablet TAKE AS DIRECTED. Patient taking differently: TAKE ONE TABLET BY MOUTH TWICE DAILY 05/01/15  Yes Jonnie Kind, MD  albuterol (PROVENTIL) (2.5 MG/3ML) 0.083% nebulizer solution Take 2.5 mg by nebulization 2 (two) times daily.    Yes Historical Provider, MD  atenolol (TENORMIN) 25 MG tablet Take 25 mg by mouth daily.    Yes Historical Provider, MD  Cholecalciferol (VITAMIN D-3) 1000 UNITS CAPS Take 1,000 capsules by mouth daily.   Yes Historical Provider, MD  DULoxetine (CYMBALTA) 60 MG capsule Take 60 mg by mouth 2 (two) times daily.    Yes Historical Provider, MD  EPIPEN 2-PAK 0.3 MG/0.3ML SOAJ injection Inject 0.3 mg into the skin as needed (allergic reactions).  03/04/13  Yes Historical Provider, MD  etodolac (LODINE) 400 MG tablet Take 400 mg by mouth 2 (two) times daily.  04/13/12  Yes Historical Provider, MD  fluticasone (FLONASE) 50 MCG/ACT nasal spray Place 2 sprays into the nose daily.     Yes Historical Provider, MD  gentamicin (GARAMYCIN) 0.3 % ophthalmic solution Place 1 drop into both eyes daily as needed (FOR ITCHY EYES).  07/03/15  Yes Historical Provider, MD  loratadine (CLARITIN) 10  MG tablet Take 10 mg by mouth daily.   Yes Historical Provider, MD  Omega-3 Fatty Acids (FISH OIL PO) Take 2 capsules by mouth daily.    Yes Historical Provider, MD  omeprazole (PRILOSEC) 20 MG capsule TAKE ONE CAPSULE BY MOUTH EVERY DAY 09/10/12  Yes Andria Meuse, NP  oxyCODONE-acetaminophen (PERCOCET/ROXICET) 5-325 MG tablet Take 1 tablet by mouth every 6 (six) hours as needed. 07/10/15  Yes Milton Ferguson, MD  PROAIR HFA 108 [phone] BASE) MCG/ACT inhaler Inhale 2 puffs into the lungs every 6 (six) hours as needed for wheezing or shortness of breath.  02/21/12  Yes Historical Provider, MD  promethazine (PHENERGAN) 25 MG tablet Take 25 mg by mouth every 6 (six) hours as needed for nausea or vomiting (for nausea for withdrawals).  Yes Historical Provider, MD  Oxycodone HCl 10 MG TABS Take one every 4-6 hours for pain 07/17/15   Milton Ferguson, MD   BP 165/90 mmHg  Pulse 81  Temp(Src) 98.2 F (36.8 C) (Oral)  Resp 20  Ht 5\' 5"  (1.651 m)  Wt 143 lb (64.864 kg)  BMI 23.80 kg/m2  SpO2 100% Physical Exam  Constitutional: She is oriented to person, place, and time. She appears well-developed.  HENT:  Head: Normocephalic.  Eyes: Conjunctivae and EOM are normal. No scleral icterus.  Neck: Neck supple. No thyromegaly present.  Cardiovascular: Normal rate and regular rhythm.  Exam reveals no gallop and no friction rub.   No murmur heard. Pulmonary/Chest: No stridor. She has no wheezes. She has no rales. She exhibits no tenderness.  Abdominal: She exhibits no distension. There is no tenderness. There is no rebound.  Musculoskeletal: Normal range of motion. She exhibits tenderness. She exhibits no edema.  Tender lumbar spine  Lymphadenopathy:    She has no cervical adenopathy.  Neurological: She is oriented to person, place, and time. She exhibits normal muscle tone. Coordination normal.  Skin: No rash noted. No erythema.  Psychiatric: She has a normal mood and affect. Her behavior is normal.     ED Course  Procedures (including critical care time) Labs Review Labs Reviewed - No data to display  Imaging Review No results found. I have personally reviewed and evaluated these images and lab results as part of my medical decision-making.   EKG Interpretation None      MDM   Final diagnoses:  Chronic high back pain    Patient with chronic back pain and cirrhosis. I have refilled her oxycodone 10 mg for 1 week. She was prescribed 35 pills. This is the same prescription she got from her GI doctor earlier in the month. She has been instructed to make other arrangements for her pain medicine refills. She is going to contact her family doctor Dr. Legrand Rams if he will not continue the refills until she sees the pain clinic then she will contact her GI doctor to refill the medicine until she gets the pain center    Milton Ferguson, MD 07/17/15 1500

## 2015-07-17 NOTE — Discharge Instructions (Signed)
Contact dr. Legrand Rams for next week

## 2015-07-17 NOTE — ED Notes (Signed)
Patient requesting plain oxycodone for pain.

## 2015-07-17 NOTE — ED Notes (Signed)
Pt reports was seen here last week and diagnosed with flank pain and hematuria.  Pt says the er doctor prescribed her percocet instead of plain oxycodone.  Pt says is out of her oxycodoe at home.  Pt says she still has some of the percocet but doesn't want to take any more because of the tylenol in it.  Pt says still having bilateral flank pain and diarrhea.  Reports vomited yesterday.

## 2015-07-21 DIAGNOSIS — R1084 Generalized abdominal pain: Secondary | ICD-10-CM | POA: Diagnosis not present

## 2015-07-21 DIAGNOSIS — B182 Chronic viral hepatitis C: Secondary | ICD-10-CM | POA: Diagnosis not present

## 2015-07-21 DIAGNOSIS — R141 Gas pain: Secondary | ICD-10-CM | POA: Diagnosis not present

## 2015-07-21 DIAGNOSIS — K7460 Unspecified cirrhosis of liver: Secondary | ICD-10-CM | POA: Diagnosis not present

## 2015-07-21 DIAGNOSIS — R142 Eructation: Secondary | ICD-10-CM | POA: Diagnosis not present

## 2015-07-22 DIAGNOSIS — M549 Dorsalgia, unspecified: Secondary | ICD-10-CM | POA: Diagnosis not present

## 2015-07-22 DIAGNOSIS — R42 Dizziness and giddiness: Secondary | ICD-10-CM | POA: Diagnosis not present

## 2015-07-22 LAB — COMPREHENSIVE METABOLIC PANEL
ALT: 16 U/L (ref 6–29)
AST: 20 U/L (ref 10–35)
Albumin: 4.4 g/dL (ref 3.6–5.1)
Alkaline Phosphatase: 90 U/L (ref 33–130)
BUN: 16 mg/dL (ref 7–25)
CO2: 29 mmol/L (ref 20–31)
Calcium: 9.4 mg/dL (ref 8.6–10.4)
Chloride: 106 mmol/L (ref 98–110)
Creat: 0.72 mg/dL (ref 0.50–0.99)
Glucose, Bld: 101 mg/dL — ABNORMAL HIGH (ref 65–99)
Potassium: 4.3 mmol/L (ref 3.5–5.3)
Sodium: 140 mmol/L (ref 135–146)
Total Bilirubin: 0.5 mg/dL (ref 0.2–1.2)
Total Protein: 7 g/dL (ref 6.1–8.1)

## 2015-07-22 LAB — CBC WITH DIFFERENTIAL/PLATELET
Basophils Absolute: 0.1 10*3/uL (ref 0.0–0.1)
Basophils Relative: 1 % (ref 0–1)
Eosinophils Absolute: 0.2 10*3/uL (ref 0.0–0.7)
Eosinophils Relative: 2 % (ref 0–5)
HCT: 44.7 % (ref 36.0–46.0)
Hemoglobin: 14.8 g/dL (ref 12.0–15.0)
Lymphocytes Relative: 30 % (ref 12–46)
Lymphs Abs: 2.7 10*3/uL (ref 0.7–4.0)
MCH: 31 pg (ref 26.0–34.0)
MCHC: 33.1 g/dL (ref 30.0–36.0)
MCV: 93.5 fL (ref 78.0–100.0)
MPV: 12.2 fL (ref 8.6–12.4)
Monocytes Absolute: 0.5 10*3/uL (ref 0.1–1.0)
Monocytes Relative: 6 % (ref 3–12)
Neutro Abs: 5.6 10*3/uL (ref 1.7–7.7)
Neutrophils Relative %: 61 % (ref 43–77)
Platelets: 179 10*3/uL (ref 150–400)
RBC: 4.78 MIL/uL (ref 3.87–5.11)
RDW: 14.6 % (ref 11.5–15.5)
WBC: 9.1 10*3/uL (ref 4.0–10.5)

## 2015-07-22 LAB — URINALYSIS W MICROSCOPIC + REFLEX CULTURE
Bacteria, UA: NONE SEEN [HPF]
Casts: NONE SEEN [LPF]
Crystals: NONE SEEN [HPF]
Glucose, UA: NEGATIVE
Hgb urine dipstick: NEGATIVE
Ketones, ur: NEGATIVE
Nitrite: NEGATIVE
Protein, ur: NEGATIVE
RBC / HPF: NONE SEEN RBC/HPF (ref <=2)
Specific Gravity, Urine: 1.02 (ref 1.001–1.035)
WBC, UA: NONE SEEN WBC/HPF (ref <=5)
Yeast: NONE SEEN [HPF]
pH: 5.5 (ref 5.0–8.0)

## 2015-07-22 LAB — HCV RNA QUANT RFLX ULTRA OR GENOTYP: HCV Quantitative: NOT DETECTED IU/mL (ref <15)

## 2015-07-23 LAB — URINE CULTURE: Colony Count: 15000

## 2015-07-26 ENCOUNTER — Encounter (HOSPITAL_COMMUNITY): Payer: Self-pay | Admitting: Emergency Medicine

## 2015-07-26 ENCOUNTER — Emergency Department (HOSPITAL_COMMUNITY): Payer: Medicare Other

## 2015-07-26 ENCOUNTER — Emergency Department (HOSPITAL_COMMUNITY)
Admission: EM | Admit: 2015-07-26 | Discharge: 2015-07-26 | Disposition: A | Discharge status: Home / Self Care | Payer: Medicare Other | Attending: Emergency Medicine | Admitting: Emergency Medicine

## 2015-07-26 DIAGNOSIS — Z8669 Personal history of other diseases of the nervous system and sense organs: Secondary | ICD-10-CM | POA: Insufficient documentation

## 2015-07-26 DIAGNOSIS — G8929 Other chronic pain: Secondary | ICD-10-CM | POA: Diagnosis not present

## 2015-07-26 DIAGNOSIS — K219 Gastro-esophageal reflux disease without esophagitis: Secondary | ICD-10-CM | POA: Insufficient documentation

## 2015-07-26 DIAGNOSIS — Z791 Long term (current) use of non-steroidal anti-inflammatories (NSAID): Secondary | ICD-10-CM | POA: Insufficient documentation

## 2015-07-26 DIAGNOSIS — Z79899 Other long term (current) drug therapy: Secondary | ICD-10-CM | POA: Diagnosis not present

## 2015-07-26 DIAGNOSIS — I10 Essential (primary) hypertension: Secondary | ICD-10-CM | POA: Insufficient documentation

## 2015-07-26 DIAGNOSIS — R42 Dizziness and giddiness: Secondary | ICD-10-CM | POA: Insufficient documentation

## 2015-07-26 DIAGNOSIS — F329 Major depressive disorder, single episode, unspecified: Secondary | ICD-10-CM | POA: Insufficient documentation

## 2015-07-26 DIAGNOSIS — F17210 Nicotine dependence, cigarettes, uncomplicated: Secondary | ICD-10-CM | POA: Insufficient documentation

## 2015-07-26 MED ORDER — OXYCODONE HCL 10 MG PO TABS: 10.0000 mg | ORAL_TABLET | Freq: Three times a day (TID) | ORAL | Status: DC | PRN

## 2015-07-26 MED ORDER — MECLIZINE HCL 25 MG PO TABS: 25.0000 mg | ORAL_TABLET | Freq: Three times a day (TID) | ORAL | Status: DC | PRN

## 2015-07-26 MED ORDER — MECLIZINE HCL 12.5 MG PO TABS
25.0000 mg | ORAL_TABLET | Freq: Once | ORAL | Status: AC
  Administered 2015-07-26: 25 mg via ORAL
  Filled 2015-07-26: qty 2

## 2015-07-26 MED ORDER — LORAZEPAM 1 MG PO TABS
1.0000 mg | ORAL_TABLET | Freq: Once | ORAL | Status: AC
  Administered 2015-07-26: 1 mg via ORAL
  Filled 2015-07-26: qty 1

## 2015-07-26 NOTE — ED Provider Notes (Signed)
CSN: [id number]     Arrival date & time 07/26/15  2042 History  By signing my name below, I, Altamease Oiler, attest that this documentation has been prepared under the direction and in the presence of Nat Christen, MD. Electronically Signed: Altamease Oiler, ED Scribe. 07/26/2015. 9:31 PM   Chief Complaint  Patient presents with  . Dizziness   The history is provided by the patient. No language interpreter was used.   Sabrina Wyatt is a 64 y.o. female with history of intracranial hemorrhage, cirrhosis of the liver, hepatitis C, HTN, chronic back and abdominal pain, and polysubstance abuse who presents to the Emergency Department complaining of constant room-spinning dizziness with onset 1 week ago. The sensation is present when she stands and if she is laying down. Pt was seen by Dr. Legrand Rams 5 days ago where her neck was "snapped" and she was told that she had an inner ear infection. The sensation has worsened since being seen at that time. Pt states that this dizziness is similar to what she felt when she had a brain bleed in 1998.  Pt is also requesting a refill on the Percocet that she was written for from the ED on 07/10/15 to treat her chronic pain as her PCP refused to give her a prescription at her most recent visit. Pt states that she is set to start at a pain management clinic on 08/11/15.  Past Medical History  Diagnosis Date  . GERD (gastroesophageal reflux disease)   . Cirrhosis, hepatitis C     CT on 04/21/15= no HCC, pt has been vaccinated for Hep A and Hep B. s/p treatment of HCV with eradication  . Depression   . Carpal tunnel syndrome   . S/P endoscopy 08/27/10    antral erosions, otherwise normal, due egd 08/2012 to screen for varices  . Abdominal wall pain     chronic  . Pulmonary nodules   . PTSD (post-traumatic stress disorder)   . Chronic low back pain   . Intracranial hemorrhage ([place]) 1998    left thalmic  . Polysubstance abuse     HX of  . S/P colonoscopy 2005     Dr Moss Mc polyp removed, otherwise normal  . Hypertension   . Tobacco abuse   . Intussusception ([place]) 04/2012  . Shortness of breath   . Chronic abdominal pain   . Biliary stone   . Seizures ([place]) 1998    with brain bleed x1. None since then and on no meds  . Chronic flank pain    Past Surgical History  Procedure Laterality Date  . Cholecystectomy  2002  . Percutaneous transhepatic cholangiograhpy and billiary drainage  2002  . Roux-en-y-hepatojejunostomy  2003    revision in 2013 for intussusception [facility] Dr. Bailey Mech)  . Spinal fusion      C5-C7  . Carpal tunnel release Bilateral 12/2010  . Esophagogastroduodenoscopy    09/10/2003    Small hiatal hernia; otherwise normal stomach, normal D1 and D2  . Colonoscopy    09/10/2003    diminutive polyp in the rectum cold biopsied/removed/ Normal colon  . Esophagogastroduodenoscopy  08/27/2010    [id number] esophagus.  No varices.  Couple of tiny antral erosions of doubtful clinical significance, otherwise normal stomach, D1 and D2.  . Esophagogastroduodenoscopy (egd) with propofol N/A 08/02/2013    RMR: Portal gastropathy. No explanation for abdominal pain which I believe is more abdominal wall in origin. She has been seen by Dr. Carlis Abbott  over at  [facility]. She is up-to-date on cross sectional imaging. She has a pain management physician in [place]. Repeat EGD for varices in 3 years.  . Colonoscopy with propofol N/A 08/02/2013    [id number] diverticulosis. next TCS 10 years.  . Biliary stone removal  2015  . Lumbar fusion    . Nose surgery  1970  . Esophagogastroduodenoscopy (egd) with propofol N/A 03/06/2015    [id number] gastric mucosac s/p bx  . Esophageal biopsy N/A 03/06/2015    Procedure: BIOPSY;  Surgeon: Daneil Dolin, MD;  Location: [facility];  Service: Endoscopy;  Laterality: N/A;   Family History  Problem Relation Age of Onset  . Coronary artery disease Brother   . Cancer Sister     lymphoma  . Cancer Father      brain   Social History  Substance Use Topics  . Smoking status: Current Some Day Smoker -- 0.25 packs/day for 30 years    Types: Cigarettes  . Smokeless tobacco: Never Used     Comment: One pack every 6-7 days  . Alcohol Use: Yes     Comment: occasionally    OB History    No data available     Review of Systems  Cardiovascular: Negative for chest pain and palpitations.  Gastrointestinal: Negative for nausea and vomiting.  Neurological: Positive for dizziness. Negative for syncope, speech difficulty, weakness, numbness and headaches.    Allergies  Bee venom and Tylenol  Home Medications   Prior to Admission medications   Medication Sig Start Date End Date Taking? Authorizing Provider  acyclovir (ZOVIRAX) 400 MG tablet TAKE AS DIRECTED. Patient taking differently: TAKE ONE TABLET BY MOUTH TWICE DAILY 05/01/15  Yes Jonnie Kind, MD  albuterol (PROVENTIL) (2.5 MG/3ML) 0.083% nebulizer solution Take 2.5 mg by nebulization 2 (two) times daily.    Yes Historical Provider, MD  atenolol (TENORMIN) 25 MG tablet Take 25 mg by mouth every morning.    Yes Historical Provider, MD  Cholecalciferol (VITAMIN D-3) 1000 UNITS CAPS Take 1,000 capsules by mouth daily.   Yes Historical Provider, MD  DULoxetine (CYMBALTA) 60 MG capsule Take 60 mg by mouth 2 (two) times daily.    Yes Historical Provider, MD  etodolac (LODINE) 400 MG tablet Take 400 mg by mouth 2 (two) times daily.  04/13/12  Yes Historical Provider, MD  ferrous sulfate 325 (65 FE) MG tablet Take 325 mg by mouth daily with breakfast.   Yes Historical Provider, MD  fluticasone (FLONASE) 50 MCG/ACT nasal spray Place 2 sprays into the nose daily.     Yes Historical Provider, MD  gentamicin (GARAMYCIN) 0.3 % ophthalmic solution Place 1 drop into both eyes daily as needed (FOR ITCHY EYES).  07/03/15  Yes Historical Provider, MD  loratadine (CLARITIN) 10 MG tablet Take 10 mg by mouth daily.   Yes Historical Provider, MD  Omega-3 Fatty Acids  (FISH OIL PO) Take 2 capsules by mouth daily.    Yes Historical Provider, MD  omeprazole (PRILOSEC) 20 MG capsule TAKE ONE CAPSULE BY MOUTH EVERY DAY 09/10/12  Yes Andria Meuse, NP  PROAIR HFA 108 (90 BASE) MCG/ACT inhaler Inhale 2 puffs into the lungs every 6 (six) hours as needed for wheezing or shortness of breath.  02/21/12  Yes Historical Provider, MD  promethazine (PHENERGAN) 25 MG tablet Take 25 mg by mouth every 6 (six) hours as needed for nausea or vomiting (for nausea for withdrawals).   Yes Historical Provider, MD  pyridOXINE (VITAMIN B-6) 100 MG tablet  Take 100 mg by mouth daily.   Yes Historical Provider, MD  tiZANidine (ZANAFLEX) 4 MG tablet Take 4 mg by mouth 2 (two) times daily.  07/26/15  Yes Historical Provider, MD  EPIPEN 2-PAK 0.3 MG/0.3ML SOAJ injection Inject 0.3 mg into the skin as needed (allergic reactions).  03/04/13   Historical Provider, MD  meclizine (ANTIVERT) 25 MG tablet Take 1 tablet (25 mg total) by mouth 3 (three) times daily as needed for dizziness. 07/26/15   Nat Christen, MD  Oxycodone HCl 10 MG TABS Take 1 tablet (10 mg total) by mouth 3 (three) times daily as needed. 07/26/15   Nat Christen, MD  oxyCODONE-acetaminophen (PERCOCET/ROXICET) 5-325 MG tablet Take 1 tablet by mouth every 6 (six) hours as needed. Patient not taking: Reported on 07/26/2015 07/10/15   Milton Ferguson, MD   BP 160/81 mmHg  Pulse 100  Temp(Src) 98.2 F (36.8 C) (Oral)  Resp 18  Ht 5\' 5"  (1.651 m)  Wt 137 lb (62.143 kg)  BMI 22.80 kg/m2  SpO2 98% Physical Exam  Constitutional: She is oriented to person, place, and time. She appears well-developed and well-nourished.  HENT:  Head: Normocephalic and atraumatic.  Eyes: Conjunctivae and EOM are normal. Pupils are equal, round, and reactive to light.  Neck: Normal range of motion. Neck supple.  Cardiovascular: Normal rate and regular rhythm.   Pulmonary/Chest: Effort normal and breath sounds normal.  Abdominal: Soft. Bowel sounds are normal.   Musculoskeletal: Normal range of motion.  Neurological: She is alert and oriented to person, place, and time.  Skin: Skin is warm and dry.  Psychiatric: She has a normal mood and affect. Her behavior is normal.  Nursing note and vitals reviewed.   ED Course  Procedures (including critical care time)  DIAGNOSTIC STUDIES: Oxygen Saturation is 98% on RA,  normal by my interpretation.    COORDINATION OF CARE: 9:21 PM Discussed treatment plan which includes CT head and antivertiginous medication with pt at bedside and pt agreed to plan.  Labs Review Labs Reviewed - No data to display  Imaging Review Ct Head Wo Contrast  07/26/2015  CLINICAL DATA:  64 year old female with dizziness and vertigo EXAM: CT HEAD WITHOUT CONTRAST TECHNIQUE: Contiguous axial images were obtained from the base of the skull through the vertex without intravenous contrast. COMPARISON:  Head CT dated 11/01/2013 FINDINGS: The ventricles and sulci are appropriate in size for the patient's age. Minimal periventricular and deep white matter areas of hypodensity compatible with chronic microvascular ischemic changes. There is no intracranial hemorrhage. No mass effect or midline shift identified. There is no extra-axial fluid collection. The visualized paranasal sinuses and mastoid air cells are well aerated. The calvarium is intact. IMPRESSION: No acute intracranial pathology. Electronically Signed   By: Anner Crete M.D.   On: 07/26/2015 22:56   I have personally reviewed and evaluated these images as part of my medical decision-making.   EKG Interpretation None      MDM   Final diagnoses:  Vertigo    No gross neurological deficits. CT head negative. Vital signs were stable. Patient is searching for a pain management physician. Discharge medications meclizine 25 mg and oxycodone 10 mg  I personally performed the services described in this documentation, which was scribed in my presence. The recorded  information has been reviewed and is accurate.     Nat Christen, MD 07/26/15 [phone]

## 2015-07-26 NOTE — ED Notes (Signed)
Dizziness x 1 week, pt states she went to Dr Legrand Rams and he popped her neck, which made worse, dx with ear infection, pt states she was not given any medications

## 2015-07-26 NOTE — Discharge Instructions (Signed)
CT scan was normal. Medication for dizziness and pain. You must get a pain management physician other than the emergency department.

## 2015-07-30 NOTE — Progress Notes (Signed)
Quick Note:  Please let patient know her urine specimen showed no infection. Her LFTs, CBC were normal. Her HCV RNA is NEGATIVE. Next LFTs, PT/INR, MET-7, CBC in 6 months.   I notice that she has upcoming appointment to discuss her blood work. She does not have to have an appointment just to discuss blood work. Please inform her that Dr. Gala Romney will not allow me to write her additional pain medications. ______

## 2015-08-01 ENCOUNTER — Emergency Department (HOSPITAL_COMMUNITY): Payer: Medicare Other

## 2015-08-01 ENCOUNTER — Emergency Department (HOSPITAL_COMMUNITY)
Admission: EM | Admit: 2015-08-01 | Discharge: 2015-08-01 | Disposition: A | Discharge status: Home / Self Care | Payer: Medicare Other | Attending: Emergency Medicine | Admitting: Emergency Medicine

## 2015-08-01 ENCOUNTER — Encounter (HOSPITAL_COMMUNITY): Payer: Self-pay

## 2015-08-01 DIAGNOSIS — F17210 Nicotine dependence, cigarettes, uncomplicated: Secondary | ICD-10-CM | POA: Diagnosis not present

## 2015-08-01 DIAGNOSIS — Z791 Long term (current) use of non-steroidal anti-inflammatories (NSAID): Secondary | ICD-10-CM | POA: Diagnosis not present

## 2015-08-01 DIAGNOSIS — F329 Major depressive disorder, single episode, unspecified: Secondary | ICD-10-CM | POA: Diagnosis not present

## 2015-08-01 DIAGNOSIS — R404 Transient alteration of awareness: Secondary | ICD-10-CM | POA: Diagnosis not present

## 2015-08-01 DIAGNOSIS — F4310 Post-traumatic stress disorder, unspecified: Secondary | ICD-10-CM | POA: Insufficient documentation

## 2015-08-01 DIAGNOSIS — Z8619 Personal history of other infectious and parasitic diseases: Secondary | ICD-10-CM | POA: Diagnosis not present

## 2015-08-01 DIAGNOSIS — Z79899 Other long term (current) drug therapy: Secondary | ICD-10-CM | POA: Diagnosis not present

## 2015-08-01 DIAGNOSIS — Z7951 Long term (current) use of inhaled steroids: Secondary | ICD-10-CM | POA: Insufficient documentation

## 2015-08-01 DIAGNOSIS — G8929 Other chronic pain: Secondary | ICD-10-CM | POA: Insufficient documentation

## 2015-08-01 DIAGNOSIS — I10 Essential (primary) hypertension: Secondary | ICD-10-CM | POA: Insufficient documentation

## 2015-08-01 DIAGNOSIS — R531 Weakness: Secondary | ICD-10-CM | POA: Diagnosis not present

## 2015-08-01 DIAGNOSIS — K219 Gastro-esophageal reflux disease without esophagitis: Secondary | ICD-10-CM | POA: Diagnosis not present

## 2015-08-01 DIAGNOSIS — R42 Dizziness and giddiness: Secondary | ICD-10-CM | POA: Insufficient documentation

## 2015-08-01 LAB — URINALYSIS, ROUTINE W REFLEX MICROSCOPIC
Glucose, UA: NEGATIVE mg/dL
Hgb urine dipstick: NEGATIVE
Ketones, ur: NEGATIVE mg/dL
Leukocytes, UA: NEGATIVE
Nitrite: NEGATIVE
Protein, ur: NEGATIVE mg/dL
Specific Gravity, Urine: >1.03 — ABNORMAL HIGH (ref 1.005–1.030)
pH: 5 (ref 5.0–8.0)

## 2015-08-01 LAB — COMPREHENSIVE METABOLIC PANEL
ALT: 17 U/L (ref 14–54)
AST: 20 U/L (ref 15–41)
Albumin: 4.3 g/dL (ref 3.5–5.0)
Alkaline Phosphatase: 88 U/L (ref 38–126)
Anion gap: 11 (ref 5–15)
BUN: 11 mg/dL (ref 6–20)
CO2: 25 mmol/L (ref 22–32)
Calcium: 9.4 mg/dL (ref 8.9–10.3)
Chloride: 105 mmol/L (ref 101–111)
Creatinine, Ser: 0.74 mg/dL (ref 0.44–1.00)
GFR calc Af Amer: >60 mL/min (ref >60)
GFR calc non Af Amer: >60 mL/min (ref >60)
Glucose, Bld: 89 mg/dL (ref 65–99)
Potassium: 4.1 mmol/L (ref 3.5–5.1)
Sodium: 141 mmol/L (ref 135–145)
Total Bilirubin: 0.6 mg/dL (ref 0.3–1.2)
Total Protein: 7.4 g/dL (ref 6.5–8.1)

## 2015-08-01 LAB — LIPASE, BLOOD: Lipase: 34 U/L (ref 11–51)

## 2015-08-01 LAB — CBC WITH DIFFERENTIAL/PLATELET
Basophils Absolute: 0 10*3/uL (ref 0.0–0.1)
Basophils Relative: 0 %
Eosinophils Absolute: 0.2 10*3/uL (ref 0.0–0.7)
Eosinophils Relative: 2 %
HCT: 44.1 % (ref 36.0–46.0)
Hemoglobin: 14.3 g/dL (ref 12.0–15.0)
Lymphocytes Relative: 27 %
Lymphs Abs: 2.4 10*3/uL (ref 0.7–4.0)
MCH: 31 pg (ref 26.0–34.0)
MCHC: 32.4 g/dL (ref 30.0–36.0)
MCV: 95.7 fL (ref 78.0–100.0)
Monocytes Absolute: 0.5 10*3/uL (ref 0.1–1.0)
Monocytes Relative: 5 %
Neutro Abs: 6.1 10*3/uL (ref 1.7–7.7)
Neutrophils Relative %: 66 %
Platelets: 150 10*3/uL (ref 150–400)
RBC: 4.61 MIL/uL (ref 3.87–5.11)
RDW: 13.9 % (ref 11.5–15.5)
WBC: 9.2 10*3/uL (ref 4.0–10.5)

## 2015-08-01 LAB — ETHANOL: Alcohol, Ethyl (B): <5 mg/dL (ref <5)

## 2015-08-01 MED ORDER — DIAZEPAM 2 MG PO TABS: 2.0000 mg | ORAL_TABLET | Freq: Three times a day (TID) | ORAL | Status: AC

## 2015-08-01 MED ORDER — LORAZEPAM 1 MG PO TABS
1.0000 mg | ORAL_TABLET | Freq: Once | ORAL | Status: AC
  Administered 2015-08-01: 1 mg via ORAL
  Filled 2015-08-01: qty 1

## 2015-08-01 NOTE — ED Notes (Signed)
Pt brought in by EMS due to worsening dizziness. Seen here last week for same problem. Complaining of being off balanced

## 2015-08-01 NOTE — Telephone Encounter (Signed)
Patient called in stating her pcp will not give her any pain medications and she needs some pain meds to last her until her appt with Magda Paganini on Monday, 12/12th.  Routing to refill box.

## 2015-08-01 NOTE — Telephone Encounter (Signed)
Please see long documentation regarding pain medication. Will not offer refills.

## 2015-08-01 NOTE — Discharge Instructions (Signed)
As discussed, your evaluation today has been largely reassuring.  But, it is important that you monitor your condition carefully, and do not hesitate to return to the ED if you develop new, or concerning changes in your condition. ° °Otherwise, please follow-up with your physician for appropriate ongoing care. ° ° °Dizziness °Dizziness is a common problem. It is a feeling of unsteadiness or light-headedness. You may feel like you are about to faint. Dizziness can lead to injury if you stumble or fall. Anyone can become dizzy, but dizziness is more common in older adults. This condition can be caused by a number of things, including medicines, dehydration, or illness. °HOME CARE INSTRUCTIONS °Taking these steps may help with your condition: °Eating and Drinking °· Drink enough fluid to keep your urine clear or pale yellow. This helps to keep you from becoming dehydrated. Try to drink more clear fluids, such as water. °· Do not drink alcohol. °· Limit your caffeine intake if directed by your health care provider. °· Limit your salt intake if directed by your health care provider. °Activity °· Avoid making quick movements. °¨ Rise slowly from chairs and steady yourself until you feel okay. °¨ In the morning, first sit up on the side of the bed. When you feel okay, stand slowly while you hold onto something until you know that your balance is fine. °· Move your legs often if you need to stand in one place for a long time. Tighten and relax your muscles in your legs while you are standing. °· Do not drive or operate heavy machinery if you feel dizzy. °· Avoid bending down if you feel dizzy. Place items in your home so that they are easy for you to reach without leaning over. °Lifestyle °· Do not use any tobacco products, including cigarettes, chewing tobacco, or electronic cigarettes. If you need help quitting, ask your health care provider. °· Try to reduce your stress level, such as with yoga or meditation. Talk with  your health care provider if you need help. °General Instructions °· Watch your dizziness for any changes. °· Take medicines only as directed by your health care provider. Talk with your health care provider if you think that your dizziness is caused by a medicine that you are taking. °· Tell a friend or a family member that you are feeling dizzy. If he or she notices any changes in your behavior, have this person call your health care provider. °· Keep all follow-up visits as directed by your health care provider. This is important. °SEEK MEDICAL CARE IF: °· Your dizziness does not go away. °· Your dizziness or light-headedness gets worse. °· You feel nauseous. °· You have reduced hearing. °· You have new symptoms. °· You are unsteady on your feet or you feel like the room is spinning. °SEEK IMMEDIATE MEDICAL CARE IF: °· You vomit or have diarrhea and are unable to eat or drink anything. °· You have problems talking, walking, swallowing, or using your arms, hands, or legs. °· You feel generally weak. °· You are not thinking clearly or you have trouble forming sentences. It may take a friend or family member to notice this. °· You have chest pain, abdominal pain, shortness of breath, or sweating. °· Your vision changes. °· You notice any bleeding. °· You have a headache. °· You have neck pain or a stiff neck. °· You have a fever. °  °This information is not intended to replace advice given to you by your health   care provider. Make sure you discuss any questions you have with your health care provider. °  °Document Released: 02/02/2001 Document Revised: 12/24/2014 Document Reviewed: 08/05/2014 °Elsevier Interactive Patient Education ©2016 [employer] ° °

## 2015-08-01 NOTE — Telephone Encounter (Signed)
Pt is made aware. 

## 2015-08-01 NOTE — ED Provider Notes (Signed)
CSN: [id number]     Arrival date & time 08/01/15  1533 History   First MD Initiated Contact with Patient 08/01/15 1547     Chief Complaint  Patient presents with  . Dizziness     (Consider location/radiation/quality/duration/timing/severity/associated sxs/prior Treatment) HPI History presents with concern of persistent dizziness. Dizziness has been present for some time, and the patient has been evaluated both here for week ago, and with her primary care physician recently. She states that the dizziness is worse with ambulation, head motion, not improved with meclizine or other medication. She acknowledges multiple other medical issues, states that this dizziness is unique, worsening, in spite of interventions. She denies asymmetric weakness, falling, confusion. She does acknowledge nausea, denies vomiting.  Past Medical History  Diagnosis Date  . GERD (gastroesophageal reflux disease)   . Cirrhosis, hepatitis C     CT on 04/21/15= no HCC, pt has been vaccinated for Hep A and Hep B. s/p treatment of HCV with eradication  . Depression   . Carpal tunnel syndrome   . S/P endoscopy 08/27/10    antral erosions, otherwise normal, due egd 08/2012 to screen for varices  . Abdominal wall pain     chronic  . Pulmonary nodules   . PTSD (post-traumatic stress disorder)   . Chronic low back pain   . 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 Polysubstance abuse     HX of  . S/P colonoscopy 2005    Dr Moss Mc polyp removed, otherwise normal  . Hypertension   . Tobacco abuse   . Intussusception ([place]) 04/2012  . Shortness of breath   . Chronic abdominal pain   . Biliary stone   . Seizures ([place]) 1998    with brain bleed x1. None since then and on no meds  . Chronic flank pain    Past Surgical History  Procedure Laterality Date  . Cholecystectomy  2002  . Percutaneous transhepatic cholangiograhpy and billiary drainage  2002  . Roux-en-y-hepatojejunostomy  2003    revision in  2013 for intussusception [facility] Dr. Bailey Mech)  . Spinal fusion      C5-C7  . Carpal tunnel release Bilateral 12/2010  . Esophagogastroduodenoscopy    09/10/2003    Small hiatal hernia; otherwise normal stomach, normal D1 and D2  . Colonoscopy    09/10/2003    diminutive polyp in the rectum cold biopsied/removed/ Normal colon  . Esophagogastroduodenoscopy  08/27/2010    [id number] esophagus.  No varices.  Couple of tiny antral erosions of doubtful clinical significance, otherwise normal stomach, D1 and D2.  . Esophagogastroduodenoscopy (egd) with propofol N/A 08/02/2013    RMR: Portal gastropathy. No explanation for abdominal pain which I believe is more abdominal wall in origin. She has been seen by Dr. Carlis Abbott  over at [facility]. She is up-to-date on cross sectional imaging. She has a pain management physician in [place]. Repeat EGD for varices in 3 years.  . Colonoscopy with propofol N/A 08/02/2013    [id number] diverticulosis. next TCS 10 years.  . Biliary stone removal  2015  . Lumbar fusion    . Nose surgery  1970  . Esophagogastroduodenoscopy (egd) with propofol N/A 03/06/2015    [id number] gastric mucosac s/p bx  . Esophageal biopsy N/A 03/06/2015    Procedure: BIOPSY;  Surgeon: Daneil Dolin, MD;  Location: [facility];  Service: Endoscopy;  Laterality: N/A;   Family History  Problem Relation Age of Onset  . Coronary artery disease Brother   .  Cancer Sister     lymphoma  . Cancer Father     brain   Social History  Substance Use Topics  . Smoking status: Current Some Day Smoker -- 0.25 packs/day for 30 years    Types: Cigarettes  . Smokeless tobacco: Never Used     Comment: One pack every 6-7 days  . Alcohol Use: Yes     Comment: occasionally    OB History    No data available     Review of Systems  Constitutional:       Per HPI, otherwise negative  HENT:       Per HPI, otherwise negative  Respiratory:       Per HPI, otherwise negative  Cardiovascular:        Per HPI, otherwise negative  Gastrointestinal: Negative for vomiting.  Endocrine:       Negative aside from HPI  Genitourinary:       Neg aside from HPI   Musculoskeletal:       Per HPI, otherwise negative  Skin: Negative.   Neurological: Positive for dizziness and weakness. Negative for syncope.      Allergies  Bee venom and Tylenol  Home Medications   Prior to Admission medications   Medication Sig Start Date End Date Taking? Authorizing Provider  acyclovir (ZOVIRAX) 400 MG tablet TAKE AS DIRECTED. Patient taking differently: TAKE ONE TABLET BY MOUTH TWICE DAILY 05/01/15  Yes Jonnie Kind, MD  albuterol (PROVENTIL) (2.5 MG/3ML) 0.083% nebulizer solution Take 2.5 mg by nebulization 2 (two) times daily.    Yes Historical Provider, MD  atenolol (TENORMIN) 25 MG tablet Take 25 mg by mouth every morning.    Yes Historical Provider, MD  Cholecalciferol (VITAMIN D-3) 1000 UNITS CAPS Take 1,000 capsules by mouth daily.   Yes Historical Provider, MD  DULoxetine (CYMBALTA) 60 MG capsule Take 60 mg by mouth 2 (two) times daily.    Yes Historical Provider, MD  EPIPEN 2-PAK 0.3 MG/0.3ML SOAJ injection Inject 0.3 mg into the skin as needed (allergic reactions).  03/04/13  Yes Historical Provider, MD  etodolac (LODINE) 400 MG tablet Take 400 mg by mouth 2 (two) times daily.  04/13/12  Yes Historical Provider, MD  ferrous sulfate 325 (65 FE) MG tablet Take 325 mg by mouth daily with breakfast.   Yes Historical Provider, MD  fluticasone (FLONASE) 50 MCG/ACT nasal spray Place 2 sprays into the nose daily.     Yes Historical Provider, MD  gentamicin (GARAMYCIN) 0.3 % ophthalmic solution Place 1 drop into both eyes daily as needed (FOR ITCHY EYES).  07/03/15  Yes Historical Provider, MD  loratadine (CLARITIN) 10 MG tablet Take 10 mg by mouth daily.   Yes Historical Provider, MD  Omega-3 Fatty Acids (FISH OIL PO) Take 2 capsules by mouth daily.    Yes Historical Provider, MD  omeprazole (PRILOSEC) 20  MG capsule TAKE ONE CAPSULE BY MOUTH EVERY DAY 09/10/12  Yes Andria Meuse, NP  Oxycodone HCl 10 MG TABS Take 1 tablet (10 mg total) by mouth 3 (three) times daily as needed. 07/26/15  Yes Nat Christen, MD  PROAIR HFA 108 [phone] BASE) MCG/ACT inhaler Inhale 2 puffs into the lungs every 6 (six) hours as needed for wheezing or shortness of breath.  02/21/12  Yes Historical Provider, MD  pyridOXINE (VITAMIN B-6) 100 MG tablet Take 100 mg by mouth daily.   Yes Historical Provider, MD  tiZANidine (ZANAFLEX) 4 MG tablet Take 4 mg by mouth 2 (  two) times daily.  07/26/15  Yes Historical Provider, MD  meclizine (ANTIVERT) 25 MG tablet Take 1 tablet (25 mg total) by mouth 3 (three) times daily as needed for dizziness. Patient not taking: Reported on 08/01/2015 07/26/15   Nat Christen, MD  oxyCODONE-acetaminophen (PERCOCET/ROXICET) 5-325 MG tablet Take 1 tablet by mouth every 6 (six) hours as needed. Patient not taking: Reported on 08/01/2015 07/10/15   Milton Ferguson, MD   BP 130/74 mmHg  Pulse 72  Temp(Src) 98.2 F (36.8 C) (Oral)  Resp 16  Ht 5\' 5"  (1.651 m)  Wt 137 lb (62.143 kg)  BMI 22.80 kg/m2  SpO2 99% Physical Exam  Constitutional: She is oriented to person, place, and time. She appears well-developed and well-nourished. No distress.  HENT:  Head: Normocephalic and atraumatic.  Eyes: Conjunctivae and EOM are normal.  Cardiovascular: Normal rate and regular rhythm.   Pulmonary/Chest: Effort normal and breath sounds normal. No stridor. No respiratory distress.  Abdominal: She exhibits no distension. There is no tenderness.  Musculoskeletal: She exhibits no edema.  Neurological: She is alert and oriented to person, place, and time. She displays no atrophy and no tremor. No cranial nerve deficit or sensory deficit. She displays no seizure activity. Coordination normal.  Patient seemingly has a mild left pronator drift  Skin: Skin is warm and dry.  Psychiatric: She has a normal mood and affect.  Nursing  note and vitals reviewed.   ED Course  Procedures (including critical care time) Labs Review Labs Reviewed  URINALYSIS, ROUTINE W REFLEX MICROSCOPIC (NOT AT [facility]) - Abnormal; Notable for the following:    Specific Gravity, Urine >1.030 (*)    Bilirubin Urine MODERATE (*)    All other components within normal limits  COMPREHENSIVE METABOLIC PANEL  ETHANOL  LIPASE, BLOOD  CBC WITH DIFFERENTIAL/PLATELET    Imaging Review Mr Brain Wo Contrast  08/01/2015  CLINICAL DATA:  64 year old female with increasing dizziness and gait difficulty. Initial encounter. EXAM: MRI HEAD WITHOUT CONTRAST TECHNIQUE: Multiplanar, multiecho pulse sequences of the brain and surrounding structures were obtained without intravenous contrast. COMPARISON:  Head CT without contrast 07/26/2015 and earlier. Brain MRI 09/11/2010. FINDINGS: Major intracranial vascular flow voids are stable. Stable cerebral volume. Chronic right posterior corona radiata/thalamic hemorrhage or hemorrhagic lacunar infarct re- identified. No restricted diffusion or evidence of acute infarction. No restricted diffusion to suggest acute infarction. No midline shift, mass effect, evidence of mass lesion, ventriculomegaly, extra-axial collection or acute intracranial hemorrhage. Cervicomedullary junction and pituitary are within normal limits. Chronic but increased patchy cerebral white matter T2 and FLAIR hyperintensity, moderate for age. Patchy T2 hyperintensity in the pons appears stable. No cortical encephalomalacia identified. No new intracranial blood products identified. Negative visualized cervical spine. Visualized internal auditory structures appear stable and within normal limits. Mild ethmoid sinus mucosal thickening is similar to that in 2012. Other Visualized paranasal sinuses and mastoids are clear. Orbit and scalp soft tissues within normal limits. Normal bone marrow signal. IMPRESSION: 1.  No acute intracranial abnormality. 2. Progressed  and moderate chronic white matter signal abnormality since 2012 most suggestive of chronic small vessel disease. Remote right thalamic hemorrhage/ hemorrhagic lacune re- demonstrated. Electronically Signed   By: Genevie Ann M.D.   On: 08/01/2015 18:05   I have personally reviewed and evaluated these images and lab results as part of my medical decision-making.   Chart review notable for evaluation one week ago for dizziness.  9:07 PM Patient ambulatory, feeling better. She does complain of pain in  the bilateral posterior neck laterally, no midline pain, no new weakness anywhere. We discussed possibilities including tension headache. Given the largely reassuring MRI, though there is evidence for prior infarct, and chronic disease, patient will follow-up with urology as an outpatient.  MDM  Patient presents with worsening dizziness, new gait instability. Here she is initially uncomfortable, though she improves while being evaluated. With concern for gait instability given her multiple medical issues, MRI was performed. This was notable for demonstration of multiple white matter changes, as well as old infarct. Patient improved here, and without evidence for new focal infarct, she was discharged to continue evaluation as an outpatient.  Carmin Muskrat, MD 08/01/15 2108

## 2015-08-04 ENCOUNTER — Encounter (HOSPITAL_COMMUNITY): Payer: Self-pay | Admitting: Emergency Medicine

## 2015-08-04 ENCOUNTER — Other Ambulatory Visit: Payer: Self-pay

## 2015-08-04 ENCOUNTER — Emergency Department (HOSPITAL_COMMUNITY)
Admission: EM | Admit: 2015-08-04 | Discharge: 2015-08-04 | Disposition: A | Discharge status: Home / Self Care | Payer: Medicare Other | Attending: Emergency Medicine | Admitting: Emergency Medicine

## 2015-08-04 ENCOUNTER — Telehealth: Payer: Self-pay | Admitting: Internal Medicine

## 2015-08-04 ENCOUNTER — Ambulatory Visit (INDEPENDENT_AMBULATORY_CARE_PROVIDER_SITE_OTHER): Payer: Medicare Other | Admitting: Gastroenterology

## 2015-08-04 ENCOUNTER — Encounter: Payer: Self-pay | Admitting: Gastroenterology

## 2015-08-04 VITALS — BP 150/92 | HR 101 | Temp 98.1°F | Ht 65.0 in | Wt 146.2 lb

## 2015-08-04 DIAGNOSIS — M549 Dorsalgia, unspecified: Secondary | ICD-10-CM

## 2015-08-04 DIAGNOSIS — J019 Acute sinusitis, unspecified: Secondary | ICD-10-CM | POA: Diagnosis not present

## 2015-08-04 DIAGNOSIS — K219 Gastro-esophageal reflux disease without esophagitis: Secondary | ICD-10-CM | POA: Insufficient documentation

## 2015-08-04 DIAGNOSIS — M545 Low back pain: Secondary | ICD-10-CM | POA: Insufficient documentation

## 2015-08-04 DIAGNOSIS — Z79899 Other long term (current) drug therapy: Secondary | ICD-10-CM | POA: Diagnosis not present

## 2015-08-04 DIAGNOSIS — F17210 Nicotine dependence, cigarettes, uncomplicated: Secondary | ICD-10-CM | POA: Insufficient documentation

## 2015-08-04 DIAGNOSIS — G8929 Other chronic pain: Secondary | ICD-10-CM | POA: Insufficient documentation

## 2015-08-04 DIAGNOSIS — F4310 Post-traumatic stress disorder, unspecified: Secondary | ICD-10-CM | POA: Diagnosis not present

## 2015-08-04 DIAGNOSIS — I10 Essential (primary) hypertension: Secondary | ICD-10-CM | POA: Diagnosis not present

## 2015-08-04 DIAGNOSIS — K7460 Unspecified cirrhosis of liver: Secondary | ICD-10-CM

## 2015-08-04 DIAGNOSIS — Z8619 Personal history of other infectious and parasitic diseases: Secondary | ICD-10-CM | POA: Insufficient documentation

## 2015-08-04 DIAGNOSIS — R1084 Generalized abdominal pain: Secondary | ICD-10-CM

## 2015-08-04 DIAGNOSIS — F329 Major depressive disorder, single episode, unspecified: Secondary | ICD-10-CM | POA: Insufficient documentation

## 2015-08-04 DIAGNOSIS — Z7689 Persons encountering health services in other specified circumstances: Secondary | ICD-10-CM

## 2015-08-04 DIAGNOSIS — Z7951 Long term (current) use of inhaled steroids: Secondary | ICD-10-CM | POA: Diagnosis not present

## 2015-08-04 MED ORDER — OXYCODONE HCL 10 MG PO TABS: 10.0000 mg | ORAL_TABLET | Freq: Three times a day (TID) | ORAL | Status: DC | PRN

## 2015-08-04 MED ORDER — AMOXICILLIN-POT CLAVULANATE 875-125 MG PO TABS: 1.0000 | ORAL_TABLET | Freq: Two times a day (BID) | ORAL | Status: DC

## 2015-08-04 NOTE — Assessment & Plan Note (Signed)
Chronic abdominal pain. Currently stable. Extensive surgical history complicating factors. Previously abdominal wall pain responded well to desensitization techniques. She would like to have referral back to PT for this. She has been going to PT and [place] for years and prefers to stay there. She also had chronic pain, multiple etiologies. Upcoming new patient appointment at [facility] clinic. Reiterated with patient today that we would not be able to provide any further narcotics physically for chronic pain under the recommendations of Dr. Gala Romney.

## 2015-08-04 NOTE — Patient Instructions (Signed)
1. Augmentin one tablet twice a day for 7 days for sinusitis/ear infection. 2. We will work on PT orders and referral to new PCP. 3. Return to the office in six months or sooner if needed.

## 2015-08-04 NOTE — Progress Notes (Signed)
Primary Care Physician: Rosita Fire, MD  Primary Gastroenterologist:  Garfield Cornea, MD   Chief Complaint  Patient presents with  . Follow-up    HPI: Sabrina Wyatt is a 64 y.o. female here for follow-up visit. Since she was last seen on November 9 she was in the ER on 4 separate occasions for different etiologies.   She has h/o Cirrhosis (previous HCV s/p eradication), bile duct injury requiring hepaticojejunostomy previously. Chronic abdominal pain. 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 Surgery by Dr. Bailey Mech. Seen back by Dr. Carlis Abbott recently. No apparent surgical issues.   At her last office visit she requested a 7 day supply of oxycodone to hold her over until she saw her pain management provider and discussed current pain medication regimen. She called in on November 16 stating that pain management provider at [facility] would not fill her pain medication and she wanted referral to [facility] in [place]. Dr. Gala Romney declined further pain medication from our clinic. We did proceed with referral as requested. I reviewed records from pain management at [facility] dated 07/09/2015. They declined prescribing opioids given history of opioid noncompliance and contract violations. They offered her management with non-opioid options.  We tried to contact patient in the interim with lab results. Patient states she had phone issues so didn't get our messages. She complains of diffuse body pains since she is off of her usual medications. She states Dr. Legrand Rams will not write her for pain medications either. That's why she has been to ER so many times. No specific complaints otherwise. Chronic abdominal wall pain as usual. Urine is dark. No dysuria. No melena, brbpr. No swelling in belly or legs lately. Heartburn well controlled.  Complains of sinusitis for 2 weeks duration, pressure in her years. Has been evaluating  Emergency department with vertigo-like symptoms as well. CT and MRI brain without acute findings.  Current Outpatient Prescriptions  Medication Sig Dispense Refill  . acyclovir (ZOVIRAX) 400 MG tablet TAKE AS DIRECTED. (Patient taking differently: TAKE ONE TABLET BY MOUTH TWICE DAILY) 40 tablet 11  . albuterol (PROVENTIL) (2.5 MG/3ML) 0.083% nebulizer solution Take 2.5 mg by nebulization 2 (two) times daily.     Marland Kitchen atenolol (TENORMIN) 25 MG tablet Take 25 mg by mouth every morning.     . Cholecalciferol (VITAMIN D-3) 1000 UNITS CAPS Take 1,000 capsules by mouth daily.    . diazepam (VALIUM) 2 MG tablet Take 1 tablet (2 mg total) by mouth 3 (three) times daily. 21 tablet 0  . DULoxetine (CYMBALTA) 60 MG capsule Take 60 mg by mouth 2 (two) times daily.     Marland Kitchen EPIPEN 2-PAK 0.3 MG/0.3ML SOAJ injection Inject 0.3 mg into the skin as needed (allergic reactions).     Marland Kitchen etodolac (LODINE) 400 MG tablet Take 400 mg by mouth 2 (two) times daily.     . ferrous sulfate 325 (65 FE) MG tablet Take 325 mg by mouth daily with breakfast.    . fluticasone (FLONASE) 50 MCG/ACT nasal spray Place 2 sprays into the nose daily.      Marland Kitchen gentamicin (GARAMYCIN) 0.3 % ophthalmic solution Place 1 drop into both eyes daily as needed (FOR ITCHY EYES).     Marland Kitchen loratadine (CLARITIN) 10 MG tablet Take 10 mg by mouth daily.    . meclizine (ANTIVERT) 25 MG tablet     . Omega-3 Fatty Acids (FISH OIL PO) Take 2 capsules by  mouth daily.     Marland Kitchen omeprazole (PRILOSEC) 20 MG capsule TAKE ONE CAPSULE BY MOUTH EVERY DAY 30 capsule 11  . Oxycodone HCl 10 MG TABS Take 1 tablet (10 mg total) by mouth 3 (three) times daily as needed. 20 tablet 0  . oxyCODONE-acetaminophen (PERCOCET/ROXICET) 5-325 MG tablet TK 1 T PO  EVERY SIX H PRN  0  . PROAIR HFA 108 (90 BASE) MCG/ACT inhaler Inhale 2 puffs into the lungs every 6 (six) hours as needed for wheezing or shortness of breath.     . pyridOXINE (VITAMIN B-6) 100 MG tablet Take 100 mg by mouth daily.      Marland Kitchen tiZANidine (ZANAFLEX) 4 MG tablet Take 4 mg by mouth 2 (two) times daily.      No current facility-administered medications for this visit.    Allergies as of 08/04/2015 - Review Complete 08/04/2015  Allergen Reaction Noted  . Bee venom Hives 07/10/2015  . Tylenol [acetaminophen] Nausea And Vomiting and Swelling 07/10/2015    ROS:  General: Negative for anorexia, weight loss, fever, chills, fatigue, weakness. ENT: Negative for hoarseness, difficulty swallowing , nasal congestion. CV: Negative for chest pain, angina, palpitations, dyspnea on exertion, peripheral edema.  Respiratory: Negative for dyspnea at rest, dyspnea on exertion, cough, sputum, wheezing.  GI: See history of present illness. GU:  Negative for dysuria, hematuria, urinary incontinence, urinary frequency, nocturnal urination.  Endo: Negative for unusual weight change.    Physical Examination:   BP 150/92 mmHg  Pulse 101  Temp(Src) 98.1 F (36.7 C) (Oral)  Ht 5\' 5"  (1.651 m)  Wt 146 lb 3.2 oz (66.316 kg)  BMI 24.33 kg/m2  General: Well-nourished, well-developed in no acute distress.  Eyes: No icterus. Mouth: Oropharyngeal mucosa moist and pink , no lesions erythema or exudate. Pain with percussion over sinuses. Lungs: Clear to auscultation bilaterally.  Heart: Regular rate and rhythm, no murmurs rubs or gallops.  Abdomen: Bowel sounds are normal, nontender, nondistended, no hepatosplenomegaly or masses, no abdominal bruits or hernia , no rebound or guarding.   Extremities: No lower extremity edema. No clubbing or deformities. Neuro: Alert and oriented x 4   Skin: Warm and dry, no jaundice.   Psych: Alert and cooperative, normal mood and affect.  Labs:  Lab Results  Component Value Date   CREATININE 0.74 08/01/2015   BUN 11 08/01/2015   NA 141 08/01/2015   K 4.1 08/01/2015   CL 105 08/01/2015   CO2 25 08/01/2015   Lab Results  Component Value Date   ALT 17 08/01/2015   AST 20 08/01/2015    ALKPHOS 88 08/01/2015   BILITOT 0.6 08/01/2015   Lab Results  Component Value Date   WBC 9.2 08/01/2015   HGB 14.3 08/01/2015   HCT 44.1 08/01/2015   MCV 95.7 08/01/2015   PLT 150 08/01/2015     Imaging Studies: Ct Head Wo Contrast  07/26/2015  CLINICAL DATA:  64 year old female with dizziness and vertigo EXAM: CT HEAD WITHOUT CONTRAST TECHNIQUE: Contiguous axial images were obtained from the base of the skull through the vertex without intravenous contrast. COMPARISON:  Head CT dated 11/01/2013 FINDINGS: The ventricles and sulci are appropriate in size for the patient's age. Minimal periventricular and deep white matter areas of hypodensity compatible with chronic microvascular ischemic changes. There is no intracranial hemorrhage. No mass effect or midline shift identified. There is no extra-axial fluid collection. The visualized paranasal sinuses and mastoid air cells are well aerated. The calvarium is intact. IMPRESSION:  No acute intracranial pathology. Electronically Signed   By: Anner Crete M.D.   On: 07/26/2015 22:56   Mr Brain Wo Contrast  08/01/2015  CLINICAL DATA:  64 year old female with increasing dizziness and gait difficulty. Initial encounter. EXAM: MRI HEAD WITHOUT CONTRAST TECHNIQUE: Multiplanar, multiecho pulse sequences of the brain and surrounding structures were obtained without intravenous contrast. COMPARISON:  Head CT without contrast 07/26/2015 and earlier. Brain MRI 09/11/2010. FINDINGS: Major intracranial vascular flow voids are stable. Stable cerebral volume. Chronic right posterior corona radiata/thalamic hemorrhage or hemorrhagic lacunar infarct re- identified. No restricted diffusion or evidence of acute infarction. No restricted diffusion to suggest acute infarction. No midline shift, mass effect, evidence of mass lesion, ventriculomegaly, extra-axial collection or acute intracranial hemorrhage. Cervicomedullary junction and pituitary are within normal limits.  Chronic but increased patchy cerebral white matter T2 and FLAIR hyperintensity, moderate for age. Patchy T2 hyperintensity in the pons appears stable. No cortical encephalomalacia identified. No new intracranial blood products identified. Negative visualized cervical spine. Visualized internal auditory structures appear stable and within normal limits. Mild ethmoid sinus mucosal thickening is similar to that in 2012. Other Visualized paranasal sinuses and mastoids are clear. Orbit and scalp soft tissues within normal limits. Normal bone marrow signal. IMPRESSION: 1.  No acute intracranial abnormality. 2. Progressed and moderate chronic white matter signal abnormality since 2012 most suggestive of chronic small vessel disease. Remote right thalamic hemorrhage/ hemorrhagic lacune re- demonstrated. Electronically Signed   By: Genevie Ann M.D.   On: 08/01/2015 18:05   Ct Renal Stone Study  07/10/2015  CLINICAL DATA:  Left flank pain for several months EXAM: CT ABDOMEN AND PELVIS WITHOUT CONTRAST TECHNIQUE: Multidetector CT imaging of the abdomen and pelvis was performed following the standard protocol without IV contrast. COMPARISON:  04/21/2015 FINDINGS: Lung bases are free of acute infiltrate or sizable effusion. Cirrhotic change of the liver is again identified. Pneumobilia is noted related to a prior hepatojejunostomy. No definitive mass lesion is seen. The spleen, adrenal glands and pancreas are all stable in their appearance. Kidneys demonstrate no renal calculi or obstructive changes. The appendix is within normal limits. Minimal diverticular change is noted without evidence of diverticulitis. The bladder is partially distended. No pelvic mass lesion is seen. Postsurgical changes are noted in the lumbar spine. No acute bony abnormality is noted. IMPRESSION: Postsurgical changes.  No acute abnormality noted. Electronically Signed   By: Inez Catalina M.D.   On: 07/10/2015 17:26

## 2015-08-04 NOTE — Assessment & Plan Note (Signed)
Augmentin twice a day for 10 days. Follow-up with PCP for further problems.

## 2015-08-04 NOTE — Telephone Encounter (Signed)
Someone from [facility] called in regards to the referral in the work queue that we sent earlier on this patient. They are needing more information as to why patient needs to be seen because of insurance purposes.

## 2015-08-04 NOTE — Discharge Instructions (Signed)
Chronic Pain  Chronic pain can be defined as pain that is off and on and lasts for 3-6 months or longer. Many things cause chronic pain, which can make it difficult to make a diagnosis. There are many treatment options available for chronic pain. However, finding a treatment that works well for you may require trying various approaches until the right one is found. Many people benefit from a combination of two or more types of treatment to control their pain.  SYMPTOMS   Chronic pain can occur anywhere in the body and can range from mild to very severe. Some types of chronic pain include:  · Headache.  · Low back pain.  · Cancer pain.  · Arthritis pain.  · Neurogenic pain. This is pain resulting from damage to nerves.   People with chronic pain may also have other symptoms such as:  · Depression.  · Anger.  · Insomnia.  · Anxiety.  DIAGNOSIS   Your health care provider will help diagnose your condition over time. In many cases, the initial focus will be on excluding possible conditions that could be causing the pain. Depending on your symptoms, your health care provider may order tests to diagnose your condition. Some of these tests may include:   · Blood tests.    · CT scan.    · MRI.    · X-rays.    · Ultrasounds.    · Nerve conduction studies.    You may need to see a specialist.   TREATMENT   Finding treatment that works well may take time. You may be referred to a pain specialist. He or she may prescribe medicine or therapies, such as:   · Mindful meditation or yoga.  · Shots (injections) of numbing or pain-relieving medicines into the spine or area of pain.  · Local electrical stimulation.  · Acupuncture.    · Massage therapy.    · Aroma, color, light, or sound therapy.    · Biofeedback.    · Working with a physical therapist to keep from getting stiff.    · Regular, gentle exercise.    · Cognitive or behavioral therapy.    · Group support.    Sometimes, surgery may be recommended.   HOME CARE INSTRUCTIONS    · Take all medicines as directed by your health care provider.    · Lessen stress in your life by relaxing and doing things such as listening to calming music.    · Exercise or be active as directed by your health care provider.    · Eat a healthy diet and include things such as vegetables, fruits, fish, and lean meats in your diet.    · Keep all follow-up appointments with your health care provider.    · Attend a support group with others suffering from chronic pain.  SEEK MEDICAL CARE IF:   · Your pain gets worse.    · You develop a new pain that was not there before.    · You cannot tolerate medicines given to you by your health care provider.    · You have new symptoms since your last visit with your health care provider.    SEEK IMMEDIATE MEDICAL CARE IF:   · You feel weak.    · You have decreased sensation or numbness.    · You lose control of bowel or bladder function.    · Your pain suddenly gets much worse.    · You develop shaking.  · You develop chills.  · You develop confusion.  · You develop chest pain.  · You develop shortness of breath.    MAKE SURE YOU:  ·   Document Revised: 04/11/2013 Document Reviewed: 02/02/2013 Elsevier Interactive Patient Education [employer].    As discussed you will need to obtain further medications for your chronic pain through your new pain management doctor.  You will need to use this prescription sparingly as it will need to cover you until you see [facility] on Monday.  I will not be able to prescribe you any refills of this medicine again.

## 2015-08-04 NOTE — Telephone Encounter (Signed)
Routing to Leslie

## 2015-08-04 NOTE — Telephone Encounter (Signed)
Prescription was e-prescribed to [facility].

## 2015-08-04 NOTE — ED Provider Notes (Signed)
CSN: [id number]     Arrival date & time 08/04/15  1606 History  By signing my name below, I, Rayna Sexton, attest that this documentation has been prepared under the direction and in the presence of Evalee Jefferson, PA-C. Electronically Signed: Rayna Sexton, ED Scribe. 08/04/2015. 4:08 PM.   Chief Complaint  Patient presents with  . Back Pain   The history is provided by the patient. No language interpreter was used.    HPI Comments: Sabrina Wyatt is a 64 y.o. female with a hx of C5-C7 spinal fusion, cirrhosis and chronic lower back pain who presents to the Emergency Department complaining of chronic, constant, moderate, lower back pain. She notes a radiation of pain down her left leg to her left knee resulting in difficulty ambulating. She denies weakness in her legs, no urinary or fecal incontinence or retention, no new abdominal pain but endorses chronic pain issues from a "botched" gallbladder surgery in which her common bile duct was cut, ultimately requiring multiple surgeries to resolve. Pt notes that her listed PCP discontinued prescribing her narcotics and referred her to a pain management clinic which she has an appointment at on 12/19 [facility], pt presents with appt packet). She notes that she used to take 80 mg oxycodone per day and tapered down to 5 mg per day and requests a refill until her appointment at the clinic. She notes that she's currently taking Antivert for vertigo and amoxacillin due to recent urinary frequency which has improved. She notes having taken dilaudid and morphine in the past which cause her to experience fatigue. She denies any other associated symptoms at this time.   Past Medical History  Diagnosis Date  . GERD (gastroesophageal reflux disease)   . Cirrhosis, hepatitis C     CT on 04/21/15= no HCC, pt has been vaccinated for Hep A and Hep B. s/p treatment of HCV with eradication  . Depression   . Carpal tunnel syndrome   . S/P endoscopy 08/27/10    antral  erosions, otherwise normal, due egd 08/2012 to screen for varices  . Abdominal wall pain     chronic  . Pulmonary nodules   . PTSD (post-traumatic stress disorder)   . Chronic low back pain   . 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 Polysubstance abuse     HX of  . S/P colonoscopy 2005    Dr Moss Mc polyp removed, otherwise normal  . Hypertension   . Tobacco abuse   . Intussusception ([place]) 04/2012  . Shortness of breath   . Chronic abdominal pain   . Biliary stone   . Seizures ([place]) 1998    with brain bleed x1. None since then and on no meds  . Chronic flank pain    Past Surgical History  Procedure Laterality Date  . Cholecystectomy  2002  . Percutaneous transhepatic cholangiograhpy and billiary drainage  2002  . Roux-en-y-hepatojejunostomy  2003    revision in 2013 for intussusception [facility] Dr. Bailey Mech)  . Spinal fusion      C5-C7  . Carpal tunnel release Bilateral 12/2010  . Esophagogastroduodenoscopy    09/10/2003    Small hiatal hernia; otherwise normal stomach, normal D1 and D2  . Colonoscopy    09/10/2003    diminutive polyp in the rectum cold biopsied/removed/ Normal colon  . Esophagogastroduodenoscopy  08/27/2010    [id number] esophagus.  No varices.  Couple of tiny antral erosions of doubtful clinical significance, otherwise normal stomach, D1  and D2.  . Esophagogastroduodenoscopy (egd) with propofol N/A 08/02/2013    RMR: Portal gastropathy. No explanation for abdominal pain which I believe is more abdominal wall in origin. She has been seen by Dr. Carlis Abbott  over at [facility]. She is up-to-date on cross sectional imaging. She has a pain management physician in [place]. Repeat EGD for varices in 3 years.  . Colonoscopy with propofol N/A 08/02/2013    [id number] diverticulosis. next TCS 10 years.  . Biliary stone removal  2015  . Lumbar fusion    . Nose surgery  1970  . Esophagogastroduodenoscopy (egd) with propofol N/A 03/06/2015    [id number]  gastric mucosac s/p bx  . Esophageal biopsy N/A 03/06/2015    Procedure: BIOPSY;  Surgeon: Daneil Dolin, MD;  Location: [facility];  Service: Endoscopy;  Laterality: N/A;   Family History  Problem Relation Age of Onset  . Coronary artery disease Brother   . Cancer Sister     lymphoma  . Cancer Father     brain   Social History  Substance Use Topics  . Smoking status: Current Some Day Smoker -- 0.25 packs/day for 30 years    Types: Cigarettes  . Smokeless tobacco: Never Used     Comment: One pack every 6-7 days  . Alcohol Use: Yes     Comment: occasionally    OB History    No data available     Review of Systems  Gastrointestinal: Negative for nausea, vomiting and abdominal distention.  Genitourinary: Negative.   Musculoskeletal: Positive for myalgias, arthralgias and gait problem.   Allergies  Bee venom and Tylenol  Home Medications   Prior to Admission medications   Medication Sig Start Date End Date Taking? Authorizing Provider  acyclovir (ZOVIRAX) 400 MG tablet TAKE AS DIRECTED. Patient taking differently: TAKE ONE TABLET BY MOUTH TWICE DAILY 05/01/15   Jonnie Kind, MD  albuterol (PROVENTIL) (2.5 MG/3ML) 0.083% nebulizer solution Take 2.5 mg by nebulization 2 (two) times daily.     Historical Provider, MD  amoxicillin-clavulanate (AUGMENTIN) 875-125 MG tablet Take 1 tablet by mouth 2 (two) times daily. 08/04/15   Mahala Menghini, PA-C  atenolol (TENORMIN) 25 MG tablet Take 25 mg by mouth every morning.     Historical Provider, MD  Cholecalciferol (VITAMIN D-3) 1000 UNITS CAPS Take 1,000 capsules by mouth daily.    Historical Provider, MD  diazepam (VALIUM) 2 MG tablet Take 1 tablet (2 mg total) by mouth 3 (three) times daily. 08/01/15 08/08/15  Carmin Muskrat, MD  DULoxetine (CYMBALTA) 60 MG capsule Take 60 mg by mouth 2 (two) times daily.     Historical Provider, MD  EPIPEN 2-PAK 0.3 MG/0.3ML SOAJ injection Inject 0.3 mg into the skin as needed (allergic reactions).   03/04/13   Historical Provider, MD  etodolac (LODINE) 400 MG tablet Take 400 mg by mouth 2 (two) times daily.  04/13/12   Historical Provider, MD  ferrous sulfate 325 (65 FE) MG tablet Take 325 mg by mouth daily with breakfast.    Historical Provider, MD  fluticasone (FLONASE) 50 MCG/ACT nasal spray Place 2 sprays into the nose daily.      Historical Provider, MD  gentamicin (GARAMYCIN) 0.3 % ophthalmic solution Place 1 drop into both eyes daily as needed (FOR ITCHY EYES).  07/03/15   Historical Provider, MD  loratadine (CLARITIN) 10 MG tablet Take 10 mg by mouth daily.    Historical Provider, MD  meclizine (ANTIVERT) 25 MG tablet  07/27/15  Historical Provider, MD  Omega-3 Fatty Acids (FISH OIL PO) Take 2 capsules by mouth daily.     Historical Provider, MD  omeprazole (PRILOSEC) 20 MG capsule TAKE ONE CAPSULE BY MOUTH EVERY DAY 09/10/12   Andria Meuse, NP  Oxycodone HCl 10 MG TABS Take 1 tablet (10 mg total) by mouth 3 (three) times daily as needed. 08/04/15   Evalee Jefferson, PA-C  oxyCODONE-acetaminophen (PERCOCET/ROXICET) 5-325 MG tablet TK 1 T PO  EVERY SIX H PRN 07/10/15   Historical Provider, MD  PROAIR HFA 108 (90 BASE) MCG/ACT inhaler Inhale 2 puffs into the lungs every 6 (six) hours as needed for wheezing or shortness of breath.  02/21/12   Historical Provider, MD  pyridOXINE (VITAMIN B-6) 100 MG tablet Take 100 mg by mouth daily.    Historical Provider, MD  tiZANidine (ZANAFLEX) 4 MG tablet Take 4 mg by mouth 2 (two) times daily.  07/26/15   Historical Provider, MD   BP 150/108 mmHg  Pulse 84  Temp(Src) 98.9 F (37.2 C) (Oral)  Resp 16  Ht 5\' 5"  (1.651 m)  Wt 65.772 kg  BMI 24.13 kg/m2  SpO2 100% Physical Exam  Constitutional: She appears well-developed and well-nourished.  HENT:  Head: Normocephalic.  Eyes: Conjunctivae are normal.  Neck: Normal range of motion. Neck supple.  Cardiovascular: Normal rate and intact distal pulses.   Pedal pulses normal.  Pulmonary/Chest: Effort  normal.  Abdominal: Soft. Bowel sounds are normal. She exhibits no distension and no mass.  Musculoskeletal: Normal range of motion. She exhibits no edema.       Lumbar back: She exhibits tenderness. She exhibits no swelling, no edema and no spasm.  Neurological: She is alert. She has normal strength. She displays no atrophy and no tremor. No sensory deficit. Gait normal.  Reflex Scores:      Patellar reflexes are 2+ on the right side and 2+ on the left side.      Achilles reflexes are 2+ on the right side and 2+ on the left side. No strength deficit noted in hip and knee flexor and extensor muscle groups.  Ankle flexion and extension intact.  Skin: Skin is warm and dry.  Psychiatric: She has a normal mood and affect.  Nursing note and vitals reviewed.  ED Course  Procedures  DIAGNOSTIC STUDIES: Oxygen Saturation is 96% on RA, normal by my interpretation.    COORDINATION OF CARE: 6:07 PM Pt presents today for prescription refills. Discussed next steps with pt and she agreed to the plan. Discussed that the ed is not appropriate place for chronic pain management.  I will provide her with oxycodone for one week.  She will need to f/u with [facility] on Monday as scheduled to establish care with them.  Pt understands and agrees with plan.   Labs Review Labs Reviewed - No data to display  Imaging Review No results found.   EKG Interpretation None      MDM   Final diagnoses:  Chronic back pain    I personally performed the services described in this documentation, which was scribed in my presence. The recorded information has been reviewed and is accurate.   Evalee Jefferson, PA-C 08/05/15 [place], MD 08/07/15 1306

## 2015-08-04 NOTE — Telephone Encounter (Signed)
Pt was seen this morning by [facility] and called the office asking if we could check the room she was in to see if she dropped her prescription because she can not find it. I checked room 3 and the waiting room and didn't see anything. I asked her if it could be in her car or her purse, but she said no that she looked everywhere. She asked if [facility] could fax it to [facility]. She said it was for Augmentin

## 2015-08-04 NOTE — Assessment & Plan Note (Signed)
Currently up-to-date on labs and imaging. Return to the office in 6 months or sooner if needed.

## 2015-08-04 NOTE — Telephone Encounter (Signed)
Patient wants referral to a PT she sees in [place]. She is already established there and apparently needed new orders per patient.

## 2015-08-04 NOTE — ED Notes (Signed)
History of back pain, rates pain 10/10, shooting to left knee.

## 2015-08-04 NOTE — Progress Notes (Signed)
CC'D TO PCP °

## 2015-08-05 NOTE — Telephone Encounter (Signed)
Spoke with pt and she states that she needs PT for cervical and lumbar spine desensitization.   Would you like me to send this diagnosis for referral?  Please advise

## 2015-08-05 NOTE — Telephone Encounter (Signed)
I spoke with patient at her OV yesterday, I will only authorize PT related to GI issues (chronic abd wall pain), not her back and neck issues.

## 2015-08-05 NOTE — Telephone Encounter (Signed)
noted 

## 2015-08-05 NOTE — Telephone Encounter (Signed)
Chronic back pain.  Pt was seen in ER on 08/04/2015.

## 2015-08-07 ENCOUNTER — Other Ambulatory Visit: Payer: Self-pay

## 2015-08-07 DIAGNOSIS — R109 Unspecified abdominal pain: Secondary | ICD-10-CM

## 2015-08-12 ENCOUNTER — Ambulatory Visit: Payer: Medicare Other | Attending: Gastroenterology | Admitting: Physical Therapy

## 2015-08-12 DIAGNOSIS — M545 Low back pain, unspecified: Secondary | ICD-10-CM

## 2015-08-12 DIAGNOSIS — R1010 Upper abdominal pain, unspecified: Secondary | ICD-10-CM | POA: Insufficient documentation

## 2015-08-12 NOTE — Therapy (Signed)
[facility] [place], [place], [address] Phone: [phone]   Fax:  [phone]  Physical Therapy Evaluation  Patient Details  Name: Sabrina Wyatt MRN: [id number] Date of Birth: 1950/09/01 Referring Provider: Neil Crouch, MD  Encounter Date: 08/12/2015      PT End of Session - 08/12/15 1036    Visit Number 1   Number of Visits 8   Date for PT Re-Evaluation 09/09/15   PT Start Time [id number]   PT Stop Time 1108   PT Time Calculation (min) 32 min   Activity Tolerance Patient tolerated treatment well   Behavior During Therapy [facility] for tasks assessed/performed      Past Medical History  Diagnosis Date  . GERD (gastroesophageal reflux disease)   . Cirrhosis, hepatitis C     CT on 04/21/15= no HCC, pt has been vaccinated for Hep A and Hep B. s/p treatment of HCV with eradication  . Depression   . Carpal tunnel syndrome   . S/P endoscopy 08/27/10    antral erosions, otherwise normal, due egd 08/2012 to screen for varices  . Abdominal wall pain     chronic  . Pulmonary nodules   . PTSD (post-traumatic stress disorder)   . Chronic low back pain   . Intracranial hemorrhage ([place]) 1998    left thalmic  . Polysubstance abuse     HX of  . S/P colonoscopy 2005    Dr Moss Mc polyp removed, otherwise normal  . Hypertension   . Tobacco abuse   . Intussusception ([place]) 04/2012  . Shortness of breath   . Chronic abdominal pain   . Biliary stone   . Seizures ([place]) 1998    with brain bleed x1. None since then and on no meds  . Chronic flank pain     Past Surgical History  Procedure Laterality Date  . Cholecystectomy  2002  . Percutaneous transhepatic cholangiograhpy and billiary drainage  2002  . Roux-en-y-hepatojejunostomy  2003    revision in 2013 for intussusception [facility] Dr. Bailey Mech)  . Spinal fusion      C5-C7  . Carpal tunnel release Bilateral 12/2010  . Esophagogastroduodenoscopy    09/10/2003    Small hiatal  hernia; otherwise normal stomach, normal D1 and D2  . Colonoscopy    09/10/2003    diminutive polyp in the rectum cold biopsied/removed/ Normal colon  . Esophagogastroduodenoscopy  08/27/2010    [id number] esophagus.  No varices.  Couple of tiny antral erosions of doubtful clinical significance, otherwise normal stomach, D1 and D2.  . Esophagogastroduodenoscopy (egd) with propofol N/A 08/02/2013    RMR: Portal gastropathy. No explanation for abdominal pain which I believe is more abdominal wall in origin. She has been seen by Dr. Carlis Abbott  over at [facility]. She is up-to-date on cross sectional imaging. She has a pain management physician in [employer]. Repeat EGD for varices in 3 years.  . Colonoscopy with propofol N/A 08/02/2013    [id number] diverticulosis. next TCS 10 years.  . Biliary stone removal  2015  . Lumbar fusion    . Nose surgery  1970  . Esophagogastroduodenoscopy (egd) with propofol N/A 03/06/2015    [id number] gastric mucosac s/p bx  . Esophageal biopsy N/A 03/06/2015    Procedure: BIOPSY;  Surgeon: Daneil Dolin, MD;  Location: [facility];  Service: Endoscopy;  Laterality: N/A;    There were no vitals filed for this visit.  Visit Diagnosis:  Pain of upper abdomen - Plan: PT  plan of care cert/re-cert  Bilateral low back pain without sciatica - Plan: PT plan of care cert/re-cert      Subjective Assessment - 08/12/15 1036    Subjective Patient reports she is now going to be seeing a new MD at [facility] clinic in January. Presently she is here for pain in the abdominal region. She reports her pain is in the low back and neck as well.    How long can you stand comfortably? 30 min   How long can you walk comfortably? 30 min   Patient Stated Goals Decrease pain.   Currently in Pain? Yes   Pain Score 6    Pain Location Abdomen   Pain Orientation Right  along incision   Pain Descriptors / Indicators Squeezing   Pain Type Chronic pain   Pain Onset More than a month ago   Pain  Frequency Intermittent   Aggravating Factors  walking and standing   Pain Relieving Factors wearing brace and engaging abdominal muscles   Effect of Pain on Daily Activities limited   Multiple Pain Sites Yes   Pain Score 8   Pain Location Back   Pain Orientation Lower   Pain Descriptors / Indicators Aching   Pain Onset More than a month ago   Pain Frequency Constant   Aggravating Factors  standing and walking   Pain Relieving Factors TENS/Heat/massage   Effect of Pain on Daily Activities limited   Pain Score 6   Pain Location Neck   Pain Orientation Posterior   Pain Descriptors / Indicators Spasm   Pain Type Chronic pain   Pain Onset More than a month ago   Pain Frequency Intermittent   Aggravating Factors  anything   Pain Relieving Factors stretching   Effect of Pain on Daily Activities limited            [facility] PT Assessment - 08/12/15 0001    Assessment   Medical Diagnosis chronic abdominal pain   Referring Provider Neil Crouch, MD   Onset Date/Surgical Date --  2002   Next MD Visit 6 months   Precautions   Precautions None   Precaution Comments allergic to bee stings'   Restrictions   Weight Bearing Restrictions No   Balance Screen   Has the patient fallen in the past 6 months No   Has the patient had a decrease in activity level because of a fear of falling?  No   Is the patient reluctant to leave their home because of a fear of falling?  No   Home Ecologist residence   Prior Function   Level of Independence Independent   Cognition   Overall Cognitive Status Within Functional Limits for tasks assessed   Observation/Other Assessments   Other Surveys  --  clinical judgement   Posture/Postural Control   Posture Comments Anterior pelvic tilting anfd notable abdominal distention.   ROM / Strength   AROM / PROM / Strength AROM   AROM   Overall AROM Comments WFL Lumbar; WNL BLE   Strength   Overall Strength Comments B hips  4+/5 in flex, ext and ABD; B knees 5/5; ankle DF 5/5   Palpation   Palpation comment tender at superior incision at midline and just to right.; proximal sacrum and gluteals; lumbar paraspinals   Special Tests    Special Tests --  patellar tendon reflex normal B; achilles reflex absent B  [place] Adult PT Treatment/Exercise - September 07, 2015 0001    Manual Therapy   Manual Therapy Myofascial release   Myofascial Release scar massage (deep) to abdomen                  PT Short Term Goals - 05/14/15 1007    PT SHORT TERM GOAL #1   Title Ind with HEP.   Time 2   Period Weeks   Status Achieved           PT Long Term Goals - 2015/09/07 1305    PT LONG TERM GOAL #1   Title decreased abdominal stiffness to 4/10 or better   Time 4   Period Weeks   Status New   PT LONG TERM GOAL #2   Title Stand 30 minutes with pain not > 4/10.   Time 4   Period Weeks   Status New   PT LONG TERM GOAL #3   Title Perform ADL's with pain not > 4/10.   Time 4   Period Weeks   Status New               Plan - Sep 07, 2015 1255    Clinical Impression Statement Patient is familiar to clinic. She presents with continued abdominal tightnes and pain in her back and neck. Patient reports looseness after abdonminal scar massage, but no change in pain. She continues to have pain in LB and neck. Discussed dry needling with patient and recommended for back/neck pain. Previous treatment for these conditions provide temporary relief for patient. If patient does not benefit from dry needling, patient is not appropriate for therapy.   Pt will benefit from skilled therapeutic intervention in order to improve on the following deficits Pain;Decreased activity tolerance   Rehab Potential Fair   PT Frequency 2x / week   PT Duration 4 weeks   PT Treatment/Interventions ADLs/Self Care Home Management;Electrical Stimulation;Manual techniques;Therapeutic exercise;Ultrasound;Dry  needling;Moist Heat;Scar mobilization   PT Next Visit Plan Set up patient for dry needling. STW/manual to abdomen, low back. Modalities prn.   Consulted and Agree with Plan of Care Patient          G-Codes - September 07, 2015 1307    Functional Assessment Tool Used clinical judgement   Functional Limitation Other PT primary   Mobility: Walking and Moving Around Current Status [phone]) At least 60 percent but less than 80 percent impaired, limited or restricted   Mobility: Walking and Moving Around Goal Status [phone]) At least 40 percent but less than 60 percent impaired, limited or restricted   Other PT Primary Current Status [id number]) At least 40 percent but less than 60 percent impaired, limited or restricted   Other PT Primary Goal Status [id number]) At least 20 percent but less than 40 percent impaired, limited or restricted       Problem List Patient Active Problem List   Diagnosis Date Noted  . Acute sinusitis with symptoms > 10 days 08/04/2015  . Epigastric mass 04/16/2015  . Abdominal swelling 04/16/2015  . Mucosal abnormality of stomach   . Nausea without vomiting 02/17/2015  . Loss of weight 02/17/2015  . Chronic folliculitis [id number]  . 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 Insomnia 03/12/2014  . Folliculitis [id number]  . Cirrhosis of liver ([place]) 11/15/2013  . Lower extremity edema 11/15/2013  . Abdominal pain, chronic, epigastric 07/10/2013  . Anorexia nervosa 07/10/2013  . 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  Muscle weakness (generalized) 12/28/2011  . CARPAL TUNNEL SYNDROME 10/16/2008  . ABDOMINAL BLOATING 10/16/2008  . Alcohol abuse 04/25/2008  . TOBACCO ABUSE 04/25/2008  . POST TRAUMATIC STRESS SYNDROME 04/22/2008  . DEPRESSION 04/22/2008  . INTRACRANIAL HEMORRHAGE 04/22/2008  . Esophageal reflux 04/22/2008  . INTUSSUSCEPTION 04/22/2008  . ABDOMINAL ADHESIONS  04/22/2008  . Hepatic cirrhosis due to chronic hepatitis C infection ([place]) 04/22/2008  . OBSTRUCTION OF BILE DUCT 04/22/2008  . LOW BACK PAIN, CHRONIC 04/22/2008  . Abdominal pain 04/22/2008  . ABDOMINAL PAIN OTHER SPECIFIED SITE 04/22/2008  . HEPATITIS C, HX OF 04/22/2008   Madelyn Flavors PT  08/12/2015, 4:08 PM  [facility] [address] [place], [place], [address] Phone: [phone]   Fax:  [phone]  Name: CIIN OBRYAN MRN: [id number] Date of Birth: 1950/11/13

## 2015-08-19 ENCOUNTER — Emergency Department (HOSPITAL_COMMUNITY)
Admission: EM | Admit: 2015-08-19 | Discharge: 2015-08-19 | Discharge status: Against Medical Advice | Payer: Medicare Other | Attending: Emergency Medicine | Admitting: Emergency Medicine

## 2015-08-19 ENCOUNTER — Encounter (HOSPITAL_COMMUNITY): Payer: Self-pay

## 2015-08-19 DIAGNOSIS — G8929 Other chronic pain: Secondary | ICD-10-CM | POA: Diagnosis not present

## 2015-08-19 DIAGNOSIS — R42 Dizziness and giddiness: Secondary | ICD-10-CM | POA: Diagnosis not present

## 2015-08-19 DIAGNOSIS — R069 Unspecified abnormalities of breathing: Secondary | ICD-10-CM | POA: Diagnosis not present

## 2015-08-19 DIAGNOSIS — F17210 Nicotine dependence, cigarettes, uncomplicated: Secondary | ICD-10-CM | POA: Insufficient documentation

## 2015-08-19 DIAGNOSIS — I10 Essential (primary) hypertension: Secondary | ICD-10-CM | POA: Diagnosis not present

## 2015-08-19 DIAGNOSIS — I629 Nontraumatic intracranial hemorrhage, unspecified: Secondary | ICD-10-CM | POA: Diagnosis not present

## 2015-08-19 NOTE — ED Notes (Signed)
Having vertigo and pain in my lower back.

## 2015-08-19 NOTE — ED Notes (Signed)
Pt was called at [phone], [phone], and  2345

## 2015-08-21 ENCOUNTER — Ambulatory Visit: Payer: Medicare Other | Admitting: Obstetrics and Gynecology

## 2015-08-27 ENCOUNTER — Ambulatory Visit: Payer: Medicare Other | Admitting: Obstetrics and Gynecology

## 2015-08-29 ENCOUNTER — Ambulatory Visit: Payer: Medicare Other | Admitting: Obstetrics and Gynecology

## 2015-09-02 DIAGNOSIS — Z79891 Long term (current) use of opiate analgesic: Secondary | ICD-10-CM | POA: Diagnosis not present

## 2015-09-02 DIAGNOSIS — M5031 Other cervical disc degeneration,  high cervical region: Secondary | ICD-10-CM | POA: Diagnosis not present

## 2015-09-02 DIAGNOSIS — M5416 Radiculopathy, lumbar region: Secondary | ICD-10-CM | POA: Diagnosis not present

## 2015-09-02 DIAGNOSIS — M542 Cervicalgia: Secondary | ICD-10-CM | POA: Diagnosis not present

## 2015-09-02 DIAGNOSIS — M545 Low back pain: Secondary | ICD-10-CM | POA: Diagnosis not present

## 2015-09-02 DIAGNOSIS — G894 Chronic pain syndrome: Secondary | ICD-10-CM | POA: Diagnosis not present

## 2015-09-04 ENCOUNTER — Emergency Department (HOSPITAL_COMMUNITY)
Admission: EM | Admit: 2015-09-04 | Discharge: 2015-09-04 | Disposition: A | Discharge status: Home / Self Care | Payer: Medicare Other | Attending: Emergency Medicine | Admitting: Emergency Medicine

## 2015-09-04 ENCOUNTER — Encounter (HOSPITAL_COMMUNITY): Payer: Self-pay | Admitting: *Deleted

## 2015-09-04 DIAGNOSIS — K219 Gastro-esophageal reflux disease without esophagitis: Secondary | ICD-10-CM | POA: Diagnosis not present

## 2015-09-04 DIAGNOSIS — Z8619 Personal history of other infectious and parasitic diseases: Secondary | ICD-10-CM | POA: Diagnosis not present

## 2015-09-04 DIAGNOSIS — Z79899 Other long term (current) drug therapy: Secondary | ICD-10-CM | POA: Diagnosis not present

## 2015-09-04 DIAGNOSIS — F4310 Post-traumatic stress disorder, unspecified: Secondary | ICD-10-CM | POA: Insufficient documentation

## 2015-09-04 DIAGNOSIS — Z791 Long term (current) use of non-steroidal anti-inflammatories (NSAID): Secondary | ICD-10-CM | POA: Insufficient documentation

## 2015-09-04 DIAGNOSIS — Z792 Long term (current) use of antibiotics: Secondary | ICD-10-CM | POA: Diagnosis not present

## 2015-09-04 DIAGNOSIS — I10 Essential (primary) hypertension: Secondary | ICD-10-CM | POA: Diagnosis not present

## 2015-09-04 DIAGNOSIS — Z7951 Long term (current) use of inhaled steroids: Secondary | ICD-10-CM | POA: Insufficient documentation

## 2015-09-04 DIAGNOSIS — F329 Major depressive disorder, single episode, unspecified: Secondary | ICD-10-CM | POA: Insufficient documentation

## 2015-09-04 DIAGNOSIS — G8929 Other chronic pain: Secondary | ICD-10-CM | POA: Insufficient documentation

## 2015-09-04 DIAGNOSIS — F17210 Nicotine dependence, cigarettes, uncomplicated: Secondary | ICD-10-CM | POA: Insufficient documentation

## 2015-09-04 DIAGNOSIS — M549 Dorsalgia, unspecified: Secondary | ICD-10-CM

## 2015-09-04 DIAGNOSIS — M545 Low back pain: Secondary | ICD-10-CM | POA: Diagnosis not present

## 2015-09-04 MED ORDER — DEXAMETHASONE SODIUM PHOSPHATE 4 MG/ML IJ SOLN
8.0000 mg | Freq: Once | INTRAMUSCULAR | Status: AC
  Administered 2015-09-04: 8 mg via INTRAMUSCULAR
  Filled 2015-09-04: qty 2

## 2015-09-04 MED ORDER — PREDNISONE 10 MG PO TABS: ORAL_TABLET | ORAL | Status: DC

## 2015-09-04 NOTE — Discharge Instructions (Signed)
Chronic Back Pain  When back pain lasts longer than 3 months, it is called chronic back pain.People with chronic back pain often go through certain periods that are more intense (flare-ups).  CAUSES Chronic back pain can be caused by wear and tear (degeneration) on different structures in your back. These structures include:  The bones of your spine (vertebrae) and the joints surrounding your spinal cord and nerve roots (facets).  The strong, fibrous tissues that connect your vertebrae (ligaments). Degeneration of these structures may result in pressure on your nerves. This can lead to constant pain. HOME CARE INSTRUCTIONS  Avoid bending, heavy lifting, prolonged sitting, and activities which make the problem worse.  Take brief periods of rest throughout the day to reduce your pain. Lying down or standing usually is better than sitting while you are resting.  Take over-the-counter or prescription medicines only as directed by your caregiver. SEEK IMMEDIATE MEDICAL CARE IF:   You have weakness or numbness in one of your legs or feet.  You have trouble controlling your bladder or bowels.  You have nausea, vomiting, abdominal pain, shortness of breath, or fainting.   This information is not intended to replace advice given to you by your health care provider. Make sure you discuss any questions you have with your health care provider.   Document Released: 09/16/2004 Document Revised: 11/01/2011 Document Reviewed: 01/27/2015 Elsevier Interactive Patient Education 2016 [place] taking your prednisone taper tomorrow evening as today's dose will cover you until then.  Plan to follow-up with either your primary doctor or with your new pain management specialist for further treatment of your chronic pain.

## 2015-09-04 NOTE — ED Notes (Signed)
Pt states pain management stated they could not give her medication at her visit on Monday, stating they had to wait 2 weeks for tests to come back and hold her to go to ED if she needed anything. Pt here for pain management.

## 2015-09-05 ENCOUNTER — Ambulatory Visit (INDEPENDENT_AMBULATORY_CARE_PROVIDER_SITE_OTHER): Payer: Medicare Other | Admitting: Obstetrics and Gynecology

## 2015-09-05 ENCOUNTER — Other Ambulatory Visit (HOSPITAL_COMMUNITY)
Admission: RE | Admit: 2015-09-05 | Discharge: 2015-09-05 | Disposition: A | Discharge status: Home / Self Care | Payer: Medicare Other | Source: Ambulatory Visit | Attending: Obstetrics and Gynecology | Admitting: Obstetrics and Gynecology

## 2015-09-05 VITALS — BP 100/60 | Ht 65.0 in | Wt 158.0 lb

## 2015-09-05 DIAGNOSIS — Z01419 Encounter for gynecological examination (general) (routine) without abnormal findings: Secondary | ICD-10-CM | POA: Insufficient documentation

## 2015-09-05 DIAGNOSIS — Z1151 Encounter for screening for human papillomavirus (HPV): Secondary | ICD-10-CM | POA: Diagnosis not present

## 2015-09-05 DIAGNOSIS — M5440 Lumbago with sciatica, unspecified side: Secondary | ICD-10-CM | POA: Diagnosis not present

## 2015-09-05 DIAGNOSIS — G8929 Other chronic pain: Secondary | ICD-10-CM | POA: Diagnosis not present

## 2015-09-05 DIAGNOSIS — Z124 Encounter for screening for malignant neoplasm of cervix: Secondary | ICD-10-CM

## 2015-09-05 MED ORDER — ESTRADIOL 0.1 MG/GM VA CREA: 0.5000 g | TOPICAL_CREAM | Freq: Every day | VAGINAL | Status: DC

## 2015-09-05 MED ORDER — OXYCODONE-ACETAMINOPHEN 10-325 MG PO TABS: 1.0000 | ORAL_TABLET | ORAL | Status: DC | PRN

## 2015-09-05 MED ORDER — OXYCODONE HCL 10 MG PO TABS: 10.0000 mg | ORAL_TABLET | Freq: Three times a day (TID) | ORAL | Status: DC | PRN

## 2015-09-05 NOTE — Progress Notes (Signed)
CC: vaginal irritation.    [facility] Visit  Patient name: Sabrina Wyatt MRN [id number]  Date of birth: Feb 27, 1951  CC & HPI:  Sabrina Wyatt is a 65 y.o. female presenting today for vaginal irritation. Feels irritated after sex. And first void.  ROS:  [facility] first Rx visit 24 Jan, recent change,for chronic back pain, is very impressed with their strictness, and discipline. Pt seeking pain med assstance to last til appt on 24th.  Pertinent History Reviewed:   Reviewed: Significant for hep C "eradicated" Medical         Past Medical History  Diagnosis Date  . GERD (gastroesophageal reflux disease)   . Cirrhosis, hepatitis C     CT on 04/21/15= no HCC, pt has been vaccinated for Hep A and Hep B. s/p treatment of HCV with eradication  . Depression   . Carpal tunnel syndrome   . S/P endoscopy 08/27/10    antral erosions, otherwise normal, due egd 08/2012 to screen for varices  . Abdominal wall pain     chronic  . Pulmonary nodules   . PTSD (post-traumatic stress disorder)   . Chronic low back pain   . Intracranial hemorrhage ([place]) 1998    left thalmic  . Polysubstance abuse     HX of  . S/P colonoscopy 2005    Dr Moss Mc polyp removed, otherwise normal  . Hypertension   . Tobacco abuse   . Intussusception ([place]) 04/2012  . Shortness of breath   . Chronic abdominal pain   . Biliary stone   . Seizures ([place]) 1998    with brain bleed x1. None since then and on no meds  . Chronic flank pain                               Surgical Hx:    Past Surgical History  Procedure Laterality Date  . Cholecystectomy  2002  . Percutaneous transhepatic cholangiograhpy and billiary drainage  2002  . Roux-en-y-hepatojejunostomy  2003    revision in 2013 for intussusception [facility] Dr. Bailey Mech)  . Spinal fusion      C5-C7  . Carpal tunnel release Bilateral 12/2010  . Esophagogastroduodenoscopy    09/10/2003    Small hiatal hernia; otherwise normal stomach, normal  D1 and D2  . Colonoscopy    09/10/2003    diminutive polyp in the rectum cold biopsied/removed/ Normal colon  . Esophagogastroduodenoscopy  08/27/2010    [id number] esophagus.  No varices.  Couple of tiny antral erosions of doubtful clinical significance, otherwise normal stomach, D1 and D2.  . Esophagogastroduodenoscopy (egd) with propofol N/A 08/02/2013    RMR: Portal gastropathy. No explanation for abdominal pain which I believe is more abdominal wall in origin. She has been seen by Dr. Carlis Abbott  over at [facility]. She is up-to-date on cross sectional imaging. She has a pain management physician in [place]. Repeat EGD for varices in 3 years.  . Colonoscopy with propofol N/A 08/02/2013    [id number] diverticulosis. next TCS 10 years.  . Biliary stone removal  2015  . Lumbar fusion    . Nose surgery  1970  . Esophagogastroduodenoscopy (egd) with propofol N/A 03/06/2015    [id number] gastric mucosac s/p bx  . Esophageal biopsy N/A 03/06/2015    Procedure: BIOPSY;  Surgeon: Daneil Dolin, MD;  Location: [facility];  Service: Endoscopy;  Laterality: N/A;   Medications: Reviewed &  Updated - see associated section                       Current outpatient prescriptions:  .  acyclovir (ZOVIRAX) 400 MG tablet, TAKE AS DIRECTED. (Patient taking differently: TAKE ONE TABLET BY MOUTH TWICE DAILY), Disp: 40 tablet, Rfl: 11 .  albuterol (PROVENTIL) (2.5 MG/3ML) 0.083% nebulizer solution, Take 2.5 mg by nebulization 2 (two) times daily. , Disp: , Rfl:  .  amoxicillin-clavulanate (AUGMENTIN) 875-125 MG tablet, Take 1 tablet by mouth 2 (two) times daily., Disp: 14 tablet, Rfl: 0 .  atenolol (TENORMIN) 25 MG tablet, Take 25 mg by mouth every morning. , Disp: , Rfl:  .  Cholecalciferol (VITAMIN D-3) 1000 UNITS CAPS, Take 1,000 capsules by mouth daily., Disp: , Rfl:  .  DULoxetine (CYMBALTA) 60 MG capsule, Take 60 mg by mouth 2 (two) times daily. , Disp: , Rfl:  .  EPIPEN 2-PAK 0.3 MG/0.3ML SOAJ injection,  Inject 0.3 mg into the skin as needed (allergic reactions). , Disp: , Rfl:  .  etodolac (LODINE) 400 MG tablet, Take 400 mg by mouth 2 (two) times daily. , Disp: , Rfl:  .  ferrous sulfate 325 (65 FE) MG tablet, Take 325 mg by mouth daily with breakfast., Disp: , Rfl:  .  fluticasone (FLONASE) 50 MCG/ACT nasal spray, Place 2 sprays into the nose daily.  , Disp: , Rfl:  .  gentamicin (GARAMYCIN) 0.3 % ophthalmic solution, Place 1 drop into both eyes daily as needed (FOR ITCHY EYES). , Disp: , Rfl:  .  loratadine (CLARITIN) 10 MG tablet, Take 10 mg by mouth daily., Disp: , Rfl:  .  meclizine (ANTIVERT) 25 MG tablet, , Disp: , Rfl:  .  Omega-3 Fatty Acids (FISH OIL PO), Take 2 capsules by mouth daily. , Disp: , Rfl:  .  omeprazole (PRILOSEC) 20 MG capsule, TAKE ONE CAPSULE BY MOUTH EVERY DAY, Disp: 30 capsule, Rfl: 11 .  Oxycodone HCl 10 MG TABS, Take 1 tablet (10 mg total) by mouth 3 (three) times daily as needed., Disp: 15 tablet, Rfl: 0 .  oxyCODONE-acetaminophen (PERCOCET/ROXICET) 5-325 MG tablet, TK 1 T PO  EVERY SIX H PRN, Disp: , Rfl: 0 .  predniSONE (DELTASONE) 10 MG tablet, 6, 5, 4, 3, 2 then 1 tablet by mouth daily for 6 days total., Disp: 21 tablet, Rfl: 0 .  PROAIR HFA 108 (90 BASE) MCG/ACT inhaler, Inhale 2 puffs into the lungs every 6 (six) hours as needed for wheezing or shortness of breath. , Disp: , Rfl:  .  pyridOXINE (VITAMIN B-6) 100 MG tablet, Take 100 mg by mouth daily., Disp: , Rfl:  .  tiZANidine (ZANAFLEX) 4 MG tablet, Take 4 mg by mouth 2 (two) times daily. , Disp: , Rfl:    Social History: Reviewed -  reports that she has been smoking Cigarettes.  She has a 7.5 pack-year smoking history. She has never used smokeless tobacco.  Objective Findings:  Vitals: Blood pressure 100/60, height 5\' 5"  (1.651 m), weight 158 lb (71.668 kg).  Physical Examination: General appearance - alert, well appearing, and in no distress, overweight and chronically ill appearing Mental status -  alert, oriented to person, place, and time, normal mood, behavior, speech, dress, motor activity, and thought processes Mouth - mucous membranes moist, pharynx normal without lesions Abdomen - soft, nontender, nondistended, no masses or organomegaly Pelvic - normal external genitalia, vulva, vagina, cervix, uterus and adnexa, vagina atrophic   Assessment &  Plan:   A:  1. Atrophic vaginitis  2 chronic pain P:  1. Rx estrace vc  2. Chronic pain

## 2015-09-05 NOTE — Addendum Note (Signed)
Addended by: Farley Ly on: 09/05/2015 02:19 PM   Modules accepted: Orders

## 2015-09-06 NOTE — ED Provider Notes (Signed)
CSN: [id number]     Arrival date & time 09/04/15  1705 History   First MD Initiated Contact with Patient 09/04/15 1748     Chief Complaint  Patient presents with  . Back Pain     (Consider location/radiation/quality/duration/timing/severity/associated sxs/prior Treatment) The history is provided by the patient.   Sabrina Wyatt is a 65 y.o. female with acute on chronic low back pain.  She has established care with [facility] this week, but a uds was positive for methadone (which she denies) so tests were sent out to determine if her antivert is skewing her uds result (per pt report).  Hence, she will not obtain and narcotic medicines from them until this test is back and her next visit which is on 1/20.  In the interim,  She endorses worsened chronic low back pain.    Patient denies any new injury specifically and there is no radiation of pain and no weakness or numbness in the lower extremities or urinary or bowel retention or incontinence.   The patient has tried flexeril without significant relief of symptoms.      Past Medical History  Diagnosis Date  . GERD (gastroesophageal reflux disease)   . Cirrhosis, hepatitis C     CT on 04/21/15= no HCC, pt has been vaccinated for Hep A and Hep B. s/p treatment of HCV with eradication  . Depression   . Carpal tunnel syndrome   . S/P endoscopy 08/27/10    antral erosions, otherwise normal, due egd 08/2012 to screen for varices  . Abdominal wall pain     chronic  . Pulmonary nodules   . PTSD (post-traumatic stress disorder)   . Chronic low back pain   . 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 Polysubstance abuse     HX of  . S/P colonoscopy 2005    Dr Moss Mc polyp removed, otherwise normal  . Hypertension   . Tobacco abuse   . Intussusception ([place]) 04/2012  . Shortness of breath   . Chronic abdominal pain   . Biliary stone   . Seizures ([place]) 1998    with brain bleed x1. None since then and on no meds  .  Chronic flank pain    Past Surgical History  Procedure Laterality Date  . Cholecystectomy  2002  . Percutaneous transhepatic cholangiograhpy and billiary drainage  2002  . Roux-en-y-hepatojejunostomy  2003    revision in 2013 for intussusception [facility] Dr. Bailey Mech)  . Spinal fusion      C5-C7  . Carpal tunnel release Bilateral 12/2010  . Esophagogastroduodenoscopy    09/10/2003    Small hiatal hernia; otherwise normal stomach, normal D1 and D2  . Colonoscopy    09/10/2003    diminutive polyp in the rectum cold biopsied/removed/ Normal colon  . Esophagogastroduodenoscopy  08/27/2010    [id number] esophagus.  No varices.  Couple of tiny antral erosions of doubtful clinical significance, otherwise normal stomach, D1 and D2.  . Esophagogastroduodenoscopy (egd) with propofol N/A 08/02/2013    RMR: Portal gastropathy. No explanation for abdominal pain which I believe is more abdominal wall in origin. She has been seen by Dr. Carlis Abbott  over at [facility]. She is up-to-date on cross sectional imaging. She has a pain management physician in [place]. Repeat EGD for varices in 3 years.  . Colonoscopy with propofol N/A 08/02/2013    [id number] diverticulosis. next TCS 10 years.  . Biliary stone removal  2015  . Lumbar  fusion    . Nose surgery  1970  . Esophagogastroduodenoscopy (egd) with propofol N/A 03/06/2015    [id number] gastric mucosac s/p bx  . Esophageal biopsy N/A 03/06/2015    Procedure: BIOPSY;  Surgeon: Daneil Dolin, MD;  Location: [facility];  Service: Endoscopy;  Laterality: N/A;   Family History  Problem Relation Age of Onset  . Coronary artery disease Brother   . Cancer Sister     lymphoma  . Cancer Father     brain   Social History  Substance Use Topics  . Smoking status: Current Some Day Smoker -- 0.25 packs/day for 30 years    Types: Cigarettes  . Smokeless tobacco: Never Used     Comment: One pack every 6-7 days  . Alcohol Use: Yes     Comment: occasionally     OB History    No data available     Review of Systems  Constitutional: Negative for fever.  Respiratory: Negative for shortness of breath.   Cardiovascular: Negative for chest pain and leg swelling.  Gastrointestinal: Negative for abdominal pain, constipation and abdominal distention.  Genitourinary: Negative for dysuria, urgency, frequency, flank pain and difficulty urinating.  Musculoskeletal: Positive for back pain. Negative for joint swelling and gait problem.  Skin: Negative for rash.  Neurological: Negative for weakness and numbness.      Allergies  Bee venom and Tylenol  Home Medications   Prior to Admission medications   Medication Sig Start Date End Date Taking? Authorizing Provider  acyclovir (ZOVIRAX) 400 MG tablet TAKE AS DIRECTED. Patient taking differently: TAKE ONE TABLET BY MOUTH TWICE DAILY 05/01/15   Jonnie Kind, MD  albuterol (PROVENTIL) (2.5 MG/3ML) 0.083% nebulizer solution Take 2.5 mg by nebulization 2 (two) times daily.     Historical Provider, MD  amoxicillin-clavulanate (AUGMENTIN) 875-125 MG tablet Take 1 tablet by mouth 2 (two) times daily. 08/04/15   Mahala Menghini, PA-C  atenolol (TENORMIN) 25 MG tablet Take 25 mg by mouth every morning.     Historical Provider, MD  Cholecalciferol (VITAMIN D-3) 1000 UNITS CAPS Take 1,000 capsules by mouth daily.    Historical Provider, MD  DULoxetine (CYMBALTA) 60 MG capsule Take 60 mg by mouth 2 (two) times daily.     Historical Provider, MD  EPIPEN 2-PAK 0.3 MG/0.3ML SOAJ injection Inject 0.3 mg into the skin as needed (allergic reactions).  03/04/13   Historical Provider, MD  estradiol (ESTRACE VAGINAL) 0.1 MG/GM vaginal cream Place [id number] Applicatorfuls vaginally at bedtime. 09/05/15   Jonnie Kind, MD  etodolac (LODINE) 400 MG tablet Take 400 mg by mouth 2 (two) times daily.  04/13/12   Historical Provider, MD  ferrous sulfate 325 (65 FE) MG tablet Take 325 mg by mouth daily with breakfast.    Historical  Provider, MD  fluticasone (FLONASE) 50 MCG/ACT nasal spray Place 2 sprays into the nose daily.      Historical Provider, MD  gentamicin (GARAMYCIN) 0.3 % ophthalmic solution Place 1 drop into both eyes daily as needed (FOR ITCHY EYES).  07/03/15   Historical Provider, MD  loratadine (CLARITIN) 10 MG tablet Take 10 mg by mouth daily.    Historical Provider, MD  meclizine (ANTIVERT) 25 MG tablet  07/27/15   Historical Provider, MD  Omega-3 Fatty Acids (FISH OIL PO) Take 2 capsules by mouth daily.     Historical Provider, MD  omeprazole (PRILOSEC) 20 MG capsule TAKE ONE CAPSULE BY MOUTH EVERY DAY 09/10/12   Kandice L  Ronnald Ramp, NP  Oxycodone HCl 10 MG TABS Take 1 tablet (10 mg total) by mouth 3 (three) times daily as needed. 09/05/15   Jonnie Kind, MD  predniSONE (DELTASONE) 10 MG tablet 6, 5, 4, 3, 2 then 1 tablet by mouth daily for 6 days total. 09/04/15   Evalee Jefferson, PA-C  PROAIR HFA 108 (90 BASE) MCG/ACT inhaler Inhale 2 puffs into the lungs every 6 (six) hours as needed for wheezing or shortness of breath.  02/21/12   Historical Provider, MD  pyridOXINE (VITAMIN B-6) 100 MG tablet Take 100 mg by mouth daily.    Historical Provider, MD  tiZANidine (ZANAFLEX) 4 MG tablet Take 4 mg by mouth 2 (two) times daily.  07/26/15   Historical Provider, MD   BP 119/77 mmHg  Pulse 81  Temp(Src) 98.6 F (37 C) (Oral)  Resp 16  Ht 5\' 5"  (1.651 m)  Wt 75.751 kg  BMI 27.79 kg/m2  SpO2 99% Physical Exam  Constitutional: She appears well-developed and well-nourished.  No distress.  I had to wake patient upon first entering the room.   HENT:  Head: Normocephalic.  Eyes: Conjunctivae are normal.  Neck: Normal range of motion. Neck supple.  Cardiovascular: Normal rate and intact distal pulses.   Pedal pulses normal.  Pulmonary/Chest: Effort normal.  Abdominal: Soft. Bowel sounds are normal. She exhibits no distension and no mass.  Musculoskeletal: Normal range of motion. She exhibits no edema.       Lumbar  back: She exhibits tenderness. She exhibits no swelling, no edema and no spasm.  Neurological: She is alert. She has normal strength. She displays no atrophy and no tremor. No sensory deficit. Gait normal.  Reflex Scores:      Patellar reflexes are 2+ on the right side and 2+ on the left side.      Achilles reflexes are 2+ on the right side and 2+ on the left side. No strength deficit noted in hip and knee flexor and extensor muscle groups.  Ankle flexion and extension intact.  Skin: Skin is warm and dry.  Psychiatric: She has a normal mood and affect.  Nursing note and vitals reviewed.   ED Course  Procedures (including critical care time) Labs Review Labs Reviewed - No data to display  Imaging Review No results found. I have personally reviewed and evaluated these images and lab results as part of my medical decision-making.   EKG Interpretation None      MDM   Final diagnoses:  Chronic back pain    No neuro deficit on exam or by history to suggest emergent or surgical presentation.  Also discussed worsened sx that should prompt immediate re-evaluation including distal weakness, bowel/bladder retention/incontinence.  Advised pt narcotics for chronic pain will not be prescribed.  She was placed on prednisone taper.  Advised f/u with her pcp and pain management as planned.        Evalee Jefferson, PA-C 09/06/15 [id number]  Francine Graven, DO 09/08/15 2035

## 2015-09-11 LAB — CYTOLOGY - PAP

## 2015-09-15 ENCOUNTER — Telehealth: Payer: Self-pay | Admitting: Internal Medicine

## 2015-09-15 NOTE — Telephone Encounter (Signed)
Letter mailed

## 2015-09-15 NOTE — Telephone Encounter (Signed)
FEB RECALL FOR ULTRASOUND  °

## 2015-09-16 DIAGNOSIS — M79641 Pain in right hand: Secondary | ICD-10-CM | POA: Diagnosis not present

## 2015-09-16 DIAGNOSIS — M79642 Pain in left hand: Secondary | ICD-10-CM | POA: Diagnosis not present

## 2015-09-16 DIAGNOSIS — M79671 Pain in right foot: Secondary | ICD-10-CM | POA: Diagnosis not present

## 2015-09-16 DIAGNOSIS — Z79891 Long term (current) use of opiate analgesic: Secondary | ICD-10-CM | POA: Diagnosis not present

## 2015-09-16 DIAGNOSIS — G894 Chronic pain syndrome: Secondary | ICD-10-CM | POA: Diagnosis not present

## 2015-09-16 DIAGNOSIS — M79672 Pain in left foot: Secondary | ICD-10-CM | POA: Diagnosis not present

## 2015-09-18 ENCOUNTER — Ambulatory Visit: Payer: Medicare Other | Admitting: Physical Therapy

## 2015-09-25 ENCOUNTER — Other Ambulatory Visit: Payer: Self-pay

## 2015-09-25 DIAGNOSIS — R109 Unspecified abdominal pain: Secondary | ICD-10-CM

## 2015-09-26 ENCOUNTER — Encounter: Payer: Self-pay | Admitting: Physical Therapy

## 2015-09-26 ENCOUNTER — Ambulatory Visit: Payer: Medicare Other | Attending: Anesthesiology | Admitting: Physical Therapy

## 2015-09-26 DIAGNOSIS — M545 Low back pain: Secondary | ICD-10-CM | POA: Diagnosis not present

## 2015-09-26 DIAGNOSIS — R42 Dizziness and giddiness: Secondary | ICD-10-CM | POA: Insufficient documentation

## 2015-09-26 DIAGNOSIS — R269 Unspecified abnormalities of gait and mobility: Secondary | ICD-10-CM | POA: Diagnosis not present

## 2015-09-26 NOTE — Therapy (Signed)
[facility] [address] [place], [place], [address] Phone: [phone]   Fax:  [phone]  Physical Therapy Evaluation  Patient Details  Name: Sabrina Wyatt MRN: [id number] Date of Birth: [birth year] Referring Provider: Shanon Ace, MD   Encounter Date: 09/26/2015      PT End of Session - 09/26/15 1308    Visit Number 1   Number of Visits 9   Date for PT Re-Evaluation 10/26/15   Authorization Type UHC Medicare - GCodes required   PT Start Time 1020   PT Stop Time 1109   PT Time Calculation (min) 49 min   Equipment Utilized During Treatment Other (comment)   Activity Tolerance Patient limited by lethargy   Behavior During Therapy Flat affect      Past Medical History  Diagnosis Date  . GERD (gastroesophageal reflux disease)   . Cirrhosis, hepatitis C     CT on 04/21/15= no HCC, pt has been vaccinated for Hep A and Hep B. s/p treatment of HCV with eradication  . Depression   . Carpal tunnel syndrome   . S/P endoscopy 08/27/10    antral erosions, otherwise normal, due egd 08/2012 to screen for varices  . Abdominal wall pain     chronic  . Pulmonary nodules   . PTSD (post-traumatic stress disorder)   . Chronic low back pain   . 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 Polysubstance abuse     HX of  . S/P colonoscopy 2005    Dr Moss Mc polyp removed, otherwise normal  . Hypertension   . Tobacco abuse   . Intussusception ([place]) 04/2012  . Shortness of breath   . Chronic abdominal pain   . Biliary stone   . Seizures ([place]) 1998    with brain bleed x1. None since then and on no meds  . Chronic flank pain     Past Surgical History  Procedure Laterality Date  . Cholecystectomy  2002  . Percutaneous transhepatic cholangiograhpy and billiary drainage  2002  . Roux-en-y-hepatojejunostomy  2003    revision in 2013 for intussusception [facility] Dr. Bailey Mech)  . Spinal fusion       C5-C7  . Carpal tunnel release Bilateral 12/2010  . Esophagogastroduodenoscopy    09/10/2003    Small hiatal hernia; otherwise normal stomach, normal D1 and D2  . Colonoscopy    09/10/2003    diminutive polyp in the rectum cold biopsied/removed/ Normal colon  . Esophagogastroduodenoscopy  08/27/2010    [id number] esophagus.  No varices.  Couple of tiny antral erosions of doubtful clinical significance, otherwise normal stomach, D1 and D2.  . Esophagogastroduodenoscopy (egd) with propofol N/A 08/02/2013    RMR: Portal gastropathy. No explanation for abdominal pain which I believe is more abdominal wall in origin. She has been seen by Dr. Carlis Abbott  over at [facility]. She is up-to-date on cross sectional imaging. She has a pain management physician in [place]. Repeat EGD for varices in 3 years.  . Colonoscopy with propofol N/A 08/02/2013    [id number] diverticulosis. next TCS 10 years.  . Biliary stone removal  2015  . Lumbar fusion    . Nose surgery  1970  . Esophagogastroduodenoscopy (egd) with propofol N/A 03/06/2015    [id number] gastric mucosac s/p bx  . Biopsy N/A 03/06/2015    Procedure: BIOPSY;  Surgeon: Daneil Dolin, MD;  Location: [facility];  Service: Endoscopy;  Laterality: N/A;    There were  no vitals filed for this visit.  Visit Diagnosis:  Dizziness and giddiness - Plan: PT plan of care cert/re-cert  Abnormality of gait - Plan: PT plan of care cert/re-cert      Subjective Assessment - 09/26/15 1025    Subjective Pt was experiencing sensation described as "room moving", which improved somewhat with use of meclizine, per pt. Pt states, "I stopped taking that medicine, and I still get dizzy." Pt also reported secondary sensation of "head just feels uncomfortable," with head/eye movements. Pt required cueing to remain awake during this session. Pt also required redirection due to perseveration on urine screening.    Pertinent History  Per medical chart: DDD, Hepatitis C, HTN,  cirrhosis, PTSD, depression, seizures (last was in 1998).   Per pt, R thalamic CVA (1998).   Patient Stated Goals "To stand up without the room moving."   Currently in Pain? Yes   Pain Score 9    Pain Location Back   Pain Orientation Lower;Left;Right  Left > Right side   Pain Descriptors / Indicators Squeezing;Burning   Pain Type Chronic pain   Pain Onset More than a month ago   Aggravating Factors  walking and standing   Pain Relieving Factors wearing brace and engaging abdominal muscles   Effect of Pain on Daily Activities Pain will be monitored but not directly addressed due to nature of pain, other provider directly addressing pain.   Multiple Pain Sites No            [facility] PT Assessment - 09/26/15 0001    Assessment   Medical Diagnosis Dizziness   Referring Provider Shanon Ace, MD    Onset Date/Surgical Date 07/24/15   Precautions   Precautions Fall;Other (comment)   Precaution Comments Seizure (last was in 1998; not on serizure medication at this time).   Required Braces or Orthoses Other Brace/Splint   Other Brace/Splint lumbar spine brace for compression   Restrictions   Weight Bearing Restrictions No   Balance Screen   Has the patient fallen in the past 6 months No   Has the patient had a decrease in activity level because of a fear of falling?  No   Is the patient reluctant to leave their home because of a fear of falling?  No   Home Environment   Living Environment Private residence   Living Arrangements Alone   Type of [place] to enter   Entrance Stairs-Number of Steps 3   Entrance Stairs-Rails Right   Home Layout One level   Additional Comments Pt reports no difficulty with stair negotiation.   Prior Function   Level of Independence Independent   Vocation On disability   Leisure Likes to go to church functions   Cognition   Overall Cognitive Status No family/caregiver present to determine baseline cognitive functioning   lethargic; eyes closed for majority of session; fell asleep            Vestibular Assessment - 09/26/15 0001    Vestibular Assessment   General Observation Pt notes "spinning" when getting OOB or standing up too quickly. Last episode was 4 days ago.   R thalamic bleed in 1998   Symptom Behavior   Type of Dizziness Imbalance  and "room moving"   Frequency of Dizziness daily   Duration of Dizziness seconds   Occulomotor Exam   Occulomotor Alignment Normal   Spontaneous Absent   Gaze-induced Left beating nystagmus with L gaze   Smooth Pursuits  Saccades  with horizontal > vertical smooth pursuits   Saccades Comment  hypometric in all directions   Comment Convergence appears impaired; however, difficult to visualize due to difficulty maintaining eyes open. (+) Head Thrust to L side; difficult to visualize if corrective saccade with R Head Thrust Test due to pt closing eyes. Pt symptomatic with B Head Thrust Tests.   Vestibulo-Occular Reflex   VOR Cancellation Corrective saccades  to L side   Comment Vestibular assessment limited by decreased pt arousal and pt difficulty maintaining eyes open   Positional Testing   Dix-Hallpike Dix-Hallpike Right;Dix-Hallpike Left   Sidelying Test Sidelying Right;Sidelying Left   Horizontal Canal Testing Horizontal Canal Right;Horizontal Canal Left;Horizontal Canal Right Intensity;Horizontal Canal Left Intensity   Dix-Hallpike Right   Dix-Hallpike Right Duration NA   Dix-Hallpike Right Symptoms No nystagmus   Dix-Hallpike Left   Dix-Hallpike Left Duration NA   Dix-Hallpike Left Symptoms No nystagmus   Sidelying Right   Sidelying Right Duration NA   Sidelying Right Symptoms No nystagmus   Sidelying Left   Sidelying Left Duration NA   Sidelying Left Symptoms No nystagmus   Horizontal Canal Right   Horizontal Canal Right Duration Approx. 15 sec   Horizontal Canal Right Symptoms Nystagmus  Unable to visualize fast component of nystagmus    Horizontal Canal Left   Horizontal Canal Left Duration > 45 seconds   Horizontal Canal Left Symptoms Nystagmus  Unable to visualize fast component of nystagmus   Horizontal Canal Right Intensity   Horizontal Canal Right Intensity Mild   Horizontal Canal Left Intensity   Horizontal Canal Left Intensity Moderate   Orthostatics   BP supine (x 5 minutes) 129/79 mmHg   HR supine (x 5 minutes) 81   BP standing (after 1 minute) 128/79 mmHg  "little wobbly" per pt; postural sway noted   HR standing (after 1 minute) 93   BP standing (after 3 minutes) 122/77 mmHg   HR standing (after 3 minutes) 89               [facility] Adult PT Treatment/Exercise - 09/26/15 0001    Transfers   Transfers Sit to Stand;Stand to Sit   Sit to Stand 5: Supervision   Stand to Sit 6: Modified independent (Device/Increase time)   Ambulation/Gait   Ambulation/Gait Yes   Ambulation/Gait Assistance 6: Modified independent (Device/Increase time)   Ambulation Distance (Feet) 75 Feet  linear gait   Assistive device None   Gait Pattern Decreased weight shift to right;Step-through pattern  staggering gait   Ambulation Surface Level;Indoor         Vestibular Treatment/Exercise - 09/26/15 0001    Vestibular Treatment/Exercise   Vestibular Treatment Provided Habituation   Habituation Exercises Horizontal Roll   Horizontal Roll   Number of Reps  3               PT Education - 09/26/15 1242    Education provided Yes   Education Details PT eval goals, findings, and POC.  Horizontal rolling for habituation; see Pt Instructions.   Person(s) Educated Patient   Methods Explanation;Demonstration;Verbal cues;Handout   Comprehension Verbalized understanding;Returned demonstration          PT Short Term Goals - 09/26/15 1313    PT SHORT TERM GOAL #1   Title STG's = LTG's           PT Long Term Goals - 09/26/15 1519    PT LONG TERM GOAL #1   Title Pt will independently  perform initial HEP to  maximize functional gains made in PT.  (Target date: 10/24/15)   PT LONG TERM GOAL #2   Title Positional vertigo testing will be negative to indicate resolved BPPV.  (Target date: 10/24/15)   PT LONG TERM GOAL #3   Title Assess strength and balance, if indicated, after vertigo clears.   (Target date: 10/24/15)               Plan - 2015/10/21 1312    Clinical Impression Statement Pt is a 65 y/o F referred to vestibular PT to address dizziness. PMH significant for: DDD, Hepatitis C, HTN, cirrhosis, PTSD, depression, seizures (last was in 1998), and R thalamic CVA (1998). PT evaluation was limited by decreased pt arousal and pt difficulty maintaining eyes open. Limited PT evaluation revealed the following: horizontal nystagmus (difficult to visualize fast component) with horizontal canal testing with intensity/duration and symptoms more intense on L than R; saccadic smooth pursuits; L sided gaze-evoked nystagmus;  (+) and symptomatic L Head Thrust Test. Pt will benefit from skilled outpatient PT 2x/week for up to 4 weeks to address said impairments.    Pt will benefit from skilled therapeutic intervention in order to improve on the following deficits Other (comment);Abnormal gait;Decreased balance;Dizziness  Pain will be monitored but not directly addressed due to nature of pain, other provider directly addressing pain.   Rehab Potential Fair   Clinical Impairments Affecting Rehab Potential Vestibular evaluation limited by pt difficulty remaining awake, keeping eyes open.   PT Frequency 2x / week   PT Duration 4 weeks   PT Treatment/Interventions Canalith Repostioning;Vestibular;Manual techniques;Patient/family education;ADLs/Self Care Home Management;Gait training;Stair training;Functional mobility training;Therapeutic activities;Therapeutic exercise;Balance training;Neuromuscular re-education   PT Next Visit Plan Assess for horizontal canal BPPV and treat, as indicated. Educate pt on importance of  arousal (staying awake during PT sessions) for habituation.   Consulted and Agree with Plan of Care Patient          G-Codes - October 21, 2015 1536    Functional Assessment Tool Used Clinical judgement;   presentation consistent with horizontal canal BPPV    Functional Limitation Mobility: Walking and moving around   Mobility: Walking and Moving Around Current Status [phone]) At least 20 percent but less than 40 percent impaired, limited or restricted   Mobility: Walking and Moving Around Goal Status [phone]) At least 1 percent but less than 20 percent impaired, limited or restricted       Problem List Patient Active Problem List   Diagnosis Date Noted  . Acute sinusitis with symptoms > 10 days 08/04/2015  . Epigastric mass 04/16/2015  . Abdominal swelling 04/16/2015  . Mucosal abnormality of stomach   . Nausea without vomiting 02/17/2015  . Loss of weight 02/17/2015  . Chronic folliculitis [id number]  . 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 Insomnia 03/12/2014  . Folliculitis [id number]  . Cirrhosis of liver ([place]) 11/15/2013  . Lower extremity edema 11/15/2013  . Abdominal pain, chronic, epigastric 07/10/2013  . Anorexia nervosa 07/10/2013  . 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 Muscle weakness (generalized) 12/28/2011  . CARPAL TUNNEL SYNDROME 10/16/2008  . ABDOMINAL BLOATING 10/16/2008  . Alcohol abuse 04/25/2008  . TOBACCO ABUSE 04/25/2008  . POST TRAUMATIC STRESS SYNDROME 04/22/2008  . DEPRESSION 04/22/2008  . INTRACRANIAL HEMORRHAGE 04/22/2008  . Esophageal reflux 04/22/2008  . INTUSSUSCEPTION 04/22/2008  . ABDOMINAL ADHESIONS 04/22/2008  . Hepatic cirrhosis due to chronic hepatitis  C infection ([place]) 04/22/2008  . OBSTRUCTION OF BILE DUCT 04/22/2008  . LOW BACK PAIN, CHRONIC sees [facility]. 04/22/2008  . Abdominal pain 04/22/2008  . ABDOMINAL PAIN OTHER  SPECIFIED SITE 04/22/2008  . HEPATITIS C, HX OF 04/22/2008    Billie Ruddy, PT, DPT [facility] [address] [place], [place], [address] Phone: [phone]   Fax:  [phone] 09/26/2015, 3:39 PM   Name: JALEN GARG MRN: [id number] Date of Birth: [birth year]

## 2015-09-26 NOTE — Patient Instructions (Signed)
Tip Card 1.The goal of habituation training is to assist in decreasing symptoms of vertigo, dizziness, or nausea provoked by specific head and body motions. 2.These exercises may initially increase symptoms; however, be persistent and work through symptoms. With repetition and time, the exercises will assist in reducing or eliminating symptoms. 3.Exercises should be stopped and discussed with the therapist if you experience any of the following: - Sudden change or fluctuation in hearing - New onset of ringing in the ears, or increase in current intensity - Any fluid discharge from the ear - Severe pain in neck or back - Extreme nausea  Copyright  VHI. All rights reserved.  Rolling   With pillow under head, start on back. Roll to your right side.  Hold until dizziness stops, plus 20 seconds and then roll to the left side.  Hold until dizziness stops, plus 20 seconds.  Repeat sequence 5 times per session. Do 2 sessions per day.       

## 2015-09-29 ENCOUNTER — Ambulatory Visit (HOSPITAL_COMMUNITY): Admission: RE | Admit: 2015-09-29 | Payer: Medicare Other | Source: Ambulatory Visit

## 2015-09-30 DIAGNOSIS — M545 Low back pain: Secondary | ICD-10-CM | POA: Diagnosis not present

## 2015-09-30 DIAGNOSIS — Z79891 Long term (current) use of opiate analgesic: Secondary | ICD-10-CM | POA: Diagnosis not present

## 2015-09-30 DIAGNOSIS — M79642 Pain in left hand: Secondary | ICD-10-CM | POA: Diagnosis not present

## 2015-09-30 DIAGNOSIS — M79641 Pain in right hand: Secondary | ICD-10-CM | POA: Diagnosis not present

## 2015-09-30 DIAGNOSIS — M79671 Pain in right foot: Secondary | ICD-10-CM | POA: Diagnosis not present

## 2015-09-30 DIAGNOSIS — M79672 Pain in left foot: Secondary | ICD-10-CM | POA: Diagnosis not present

## 2015-10-02 ENCOUNTER — Encounter: Payer: Self-pay | Admitting: Physical Therapy

## 2015-10-06 DIAGNOSIS — M549 Dorsalgia, unspecified: Secondary | ICD-10-CM | POA: Diagnosis not present

## 2015-10-06 DIAGNOSIS — I10 Essential (primary) hypertension: Secondary | ICD-10-CM | POA: Diagnosis not present

## 2015-10-06 DIAGNOSIS — F17200 Nicotine dependence, unspecified, uncomplicated: Secondary | ICD-10-CM | POA: Diagnosis not present

## 2015-10-06 DIAGNOSIS — J449 Chronic obstructive pulmonary disease, unspecified: Secondary | ICD-10-CM | POA: Diagnosis not present

## 2015-10-07 ENCOUNTER — Other Ambulatory Visit (HOSPITAL_COMMUNITY): Payer: Medicare Other

## 2015-10-07 ENCOUNTER — Ambulatory Visit: Payer: Medicare Other | Admitting: Physical Therapy

## 2015-10-07 DIAGNOSIS — R42 Dizziness and giddiness: Secondary | ICD-10-CM

## 2015-10-07 DIAGNOSIS — M545 Low back pain, unspecified: Secondary | ICD-10-CM

## 2015-10-07 DIAGNOSIS — R269 Unspecified abnormalities of gait and mobility: Secondary | ICD-10-CM | POA: Diagnosis not present

## 2015-10-08 ENCOUNTER — Ambulatory Visit (HOSPITAL_COMMUNITY)
Admission: RE | Admit: 2015-10-08 | Discharge: 2015-10-08 | Disposition: A | Discharge status: Home / Self Care | Payer: Medicare Other | Source: Ambulatory Visit | Attending: Gastroenterology | Admitting: Gastroenterology

## 2015-10-08 DIAGNOSIS — K7460 Unspecified cirrhosis of liver: Secondary | ICD-10-CM | POA: Diagnosis not present

## 2015-10-08 NOTE — Therapy (Signed)
[facility] [address] [place], [place], [address] Phone: [phone]   Fax:  [phone]  Physical Therapy Treatment  Patient Details  Name: Sabrina Wyatt MRN: [id number] Date of Birth: [birth year] Referring Provider: Shanon Ace, MD   Encounter Date: 10/07/2015      PT End of Session - 10/08/15 1537    Visit Number 2   Number of Visits 9   Date for PT Re-Evaluation 10/26/15   Authorization Type UHC Medicare - GCodes required   PT Start Time 0847   PT Stop Time 0931   PT Time Calculation (min) 44 min      Past Medical History  Diagnosis Date  . GERD (gastroesophageal reflux disease)   . Cirrhosis, hepatitis C     CT on 04/21/15= no HCC, pt has been vaccinated for Hep A and Hep B. s/p treatment of HCV with eradication  . Depression   . Carpal tunnel syndrome   . S/P endoscopy 08/27/10    antral erosions, otherwise normal, due egd 08/2012 to screen for varices  . Abdominal wall pain     chronic  . Pulmonary nodules   . PTSD (post-traumatic stress disorder)   . Chronic low back pain   . Intracranial hemorrhage ([place]) 1998    left thalmic  . Polysubstance abuse     HX of  . S/P colonoscopy 2005    Dr Moss Mc polyp removed, otherwise normal  . Hypertension   . Tobacco abuse   . Intussusception ([place]) 04/2012  . Shortness of breath   . Chronic abdominal pain   . Biliary stone   . Seizures ([place]) 1998    with brain bleed x1. None since then and on no meds  . Chronic flank pain     Past Surgical History  Procedure Laterality Date  . Cholecystectomy  2002  . Percutaneous transhepatic cholangiograhpy and billiary drainage  2002  . Roux-en-y-hepatojejunostomy  2003    revision in 2013 for intussusception [facility] Dr. Bailey Mech)  . Spinal fusion      C5-C7  . Carpal tunnel release Bilateral 12/2010  . Esophagogastroduodenoscopy    09/10/2003    Small hiatal hernia; otherwise normal stomach,  normal D1 and D2  . Colonoscopy    09/10/2003    diminutive polyp in the rectum cold biopsied/removed/ Normal colon  . Esophagogastroduodenoscopy  08/27/2010    [id number] esophagus.  No varices.  Couple of tiny antral erosions of doubtful clinical significance, otherwise normal stomach, D1 and D2.  . Esophagogastroduodenoscopy (egd) with propofol N/A 08/02/2013    RMR: Portal gastropathy. No explanation for abdominal pain which I believe is more abdominal wall in origin. She has been seen by Dr. Carlis Abbott  over at [facility]. She is up-to-date on cross sectional imaging. She has a pain management physician in [place]. Repeat EGD for varices in 3 years.  . Colonoscopy with propofol N/A 08/02/2013    [id number] diverticulosis. next TCS 10 years.  . Biliary stone removal  2015  . Lumbar fusion    . Nose surgery  1970  . Esophagogastroduodenoscopy (egd) with propofol N/A 03/06/2015    [id number] gastric mucosac s/p bx  . Biopsy N/A 03/06/2015    Procedure: BIOPSY;  Surgeon: Daneil Dolin, MD;  Location: [facility];  Service: Endoscopy;  Laterality: N/A;    There were no vitals filed for this visit.  Visit Diagnosis:  Bilateral low back pain without sciatica  Dizziness and giddiness  Subjective Assessment - 10/08/15 1532    Subjective Pt states that the spinning vertigo has stopped - no longer has that dizziness;  "Now I want to work on my core strength"   Pertinent History  Per medical chart: DDD, Hepatitis C, HTN, cirrhosis, PTSD, depression, seizures (last was in 1998).   Per pt, R thalamic CVA (1998).   Patient Stated Goals "To stand up without the room moving."   Currently in Pain? Yes   Pain Score 8    Pain Location Back   Pain Orientation Lower   Pain Descriptors / Indicators Constant;Dull   Pain Type Chronic pain                         [facility] Adult PT Treatment/Exercise - 10/08/15 0001    Exercises   Exercises Lumbar   Lumbar Exercises: Supine   Bridge 10  reps   Straight Leg Raise 10 reps  with pelvic tilt prior to initiating lifting of leg   Other Supine Lumbar Exercises pelvic tilts x 10 reps 3 sec hold   Other Supine Lumbar Exercises Bridging with marching x 10 reps; bridging with LE 3extension x 10 reps and bridging with hip abduction/adduction   Lumbar Exercises: Quadruped   Opposite Arm/Leg Raise Right arm/Left leg;Left arm/Right leg  2 reps; 3 sec hold       All positional testing (-) for vertigo and no nystagmus observed; R and L horizontal roll tests and Dix-Hallpike tests (-) for  nystagmus and c/o vertigo;  BPPV appears to be resolved           PT Short Term Goals - 09/26/15 1313    PT SHORT TERM GOAL #1   Title STG's = LTG's           PT Long Term Goals - 09/26/15 1519    PT LONG TERM GOAL #1   Title Pt will independently perform initial HEP to maximize functional gains made in PT.  (Target date: 10/24/15)   PT LONG TERM GOAL #2   Title Positional vertigo testing will be negative to indicate resolved BPPV.  (Target date: 10/24/15)   PT LONG TERM GOAL #3   Title Assess strength and balance, if indicated, after vertigo clears.   (Target date: 10/24/15)               Plan - 10/08/15 1537    Clinical Impression Statement No signs/symptoms of BPPV noted today and pt reported no vertigo with any positional testing; pt requested to address core strengthening with time remaining in PT session   Pt will benefit from skilled therapeutic intervention in order to improve on the following deficits Other (comment);Abnormal gait;Decreased balance;Dizziness   PT Frequency 2x / week   PT Duration 4 weeks   PT Treatment/Interventions Canalith Repostioning;Vestibular;Manual techniques;Patient/family education;ADLs/Self Care Home Management;Gait training;Stair training;Functional mobility training;Therapeutic activities;Therapeutic exercise;Balance training;Neuromuscular re-education   PT Next Visit Plan core strengthening?   Vertigo appears to be resolved        Problem List Patient Active Problem List   Diagnosis Date Noted  . Acute sinusitis with symptoms > 10 days 08/04/2015  . Epigastric mass 04/16/2015  . Abdominal swelling 04/16/2015  . Mucosal abnormality of stomach   . Nausea without vomiting 02/17/2015  . Loss of weight 02/17/2015  . Chronic folliculitis [id number]  . 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 Insomnia 03/12/2014  . Folliculitis [id number]  . Cirrhosis of liver ([place]) 11/15/2013  . Lower  extremity edema 11/15/2013  . Abdominal pain, chronic, epigastric 07/10/2013  . Anorexia nervosa 07/10/2013  . 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 Muscle weakness (generalized) 12/28/2011  . CARPAL TUNNEL SYNDROME 10/16/2008  . ABDOMINAL BLOATING 10/16/2008  . Alcohol abuse 04/25/2008  . TOBACCO ABUSE 04/25/2008  . POST TRAUMATIC STRESS SYNDROME 04/22/2008  . DEPRESSION 04/22/2008  . INTRACRANIAL HEMORRHAGE 04/22/2008  . Esophageal reflux 04/22/2008  . INTUSSUSCEPTION 04/22/2008  . ABDOMINAL ADHESIONS 04/22/2008  . Hepatic cirrhosis due to chronic hepatitis C infection ([place]) 04/22/2008  . OBSTRUCTION OF BILE DUCT 04/22/2008  . LOW BACK PAIN, CHRONIC sees [facility]. 04/22/2008  . Abdominal pain 04/22/2008  . ABDOMINAL PAIN OTHER SPECIFIED SITE 04/22/2008  . HEPATITIS C, HX OF 04/22/2008    Alda Lea, PT 10/08/2015, 3:44 PM  [facility] [address] [place], [place], [address] Phone: [phone]   Fax:  [phone]  Name: Sabrina Wyatt MRN: [id number] Date of Birth: 02/28/1951

## 2015-10-09 ENCOUNTER — Encounter: Payer: Self-pay | Admitting: Physical Therapy

## 2015-10-13 ENCOUNTER — Ambulatory Visit: Payer: Medicare Other | Admitting: Physical Therapy

## 2015-10-13 NOTE — Progress Notes (Signed)
Quick Note:  U/s stable. NIC for abd u/s in six months. [id number] ______

## 2015-10-14 ENCOUNTER — Telehealth: Payer: Self-pay | Admitting: Internal Medicine

## 2015-10-14 NOTE — Telephone Encounter (Signed)
Pt was returning call to JL. Please call [phone]

## 2015-10-15 ENCOUNTER — Ambulatory Visit: Payer: Medicare Other | Admitting: Physical Therapy

## 2015-10-15 DIAGNOSIS — R269 Unspecified abnormalities of gait and mobility: Secondary | ICD-10-CM | POA: Diagnosis not present

## 2015-10-15 DIAGNOSIS — R42 Dizziness and giddiness: Secondary | ICD-10-CM

## 2015-10-15 DIAGNOSIS — M545 Low back pain: Secondary | ICD-10-CM | POA: Diagnosis not present

## 2015-10-15 NOTE — Patient Instructions (Addendum)
Tip Card 1.The goal of habituation training is to assist in decreasing symptoms of vertigo, dizziness, or nausea provoked by specific head and body motions. 2.These exercises may initially increase symptoms; however, be persistent and work through symptoms. With repetition and time, the exercises will assist in reducing or eliminating symptoms. 3.Exercises should be stopped and discussed with the therapist if you experience any of the following: - Sudden change or fluctuation in hearing - New onset of ringing in the ears, or increase in current intensity - Any fluid discharge from the ear - Severe pain in neck or back - Extreme nausea.  * You can try these exercises if you experience vertigo again in the future. If you don't see improvement in 1-2 days with exercises, contact primary care physician.   * If dizziness occurs at rest or persists after you change position (get out of provoking position), call [phone].  Rolling   With pillow under head, start on back. Roll to your right side. Hold until dizziness stops, plus 20 seconds and then roll to the left side. Hold until dizziness stops, plus 20 seconds. Repeat sequence 5 times per session. Do 2 sessions per day.  Sit to Side-Lying   Sit on edge of bed. Lie down onto the right side and hold until dizziness stops, plus 20 seconds.  Return to sitting and wait until dizziness stops, plus 20 seconds.  Repeat to the left side. Repeat sequence 5 times per session. Do 2 sessions per day.

## 2015-10-15 NOTE — Telephone Encounter (Signed)
Tried to call pt- NA-LMOM with results. Also mailing letter to the pt.

## 2015-10-15 NOTE — Therapy (Signed)
[facility] [address] [place], [place], [address] Phone: [phone]   Fax:  [phone]  Physical Therapy Treatment  Patient Details  Name: ARIONA DESCHENE MRN: [id number] Date of Birth: 02-09-1951 Referring Provider: Shanon Ace, MD   Encounter Date: 10/15/2015      PT End of Session - 10/15/15 1309    Visit Number 3   Number of Visits 9   Date for PT Re-Evaluation 10/26/15   Authorization Type UHC Medicare - GCodes required   PT Start Time 0935   PT Stop Time 0950  Session ended early due to goals met, pt discharged   PT Time Calculation (min) 15 min   Activity Tolerance Patient tolerated treatment well   Behavior During Therapy [facility] for tasks assessed/performed      Past Medical History  Diagnosis Date  . GERD (gastroesophageal reflux disease)   . Cirrhosis, hepatitis C     CT on 04/21/15= no HCC, pt has been vaccinated for Hep A and Hep B. s/p treatment of HCV with eradication  . Depression   . Carpal tunnel syndrome   . S/P endoscopy 08/27/10    antral erosions, otherwise normal, due egd 08/2012 to screen for varices  . Abdominal wall pain     chronic  . Pulmonary nodules   . PTSD (post-traumatic stress disorder)   . Chronic low back pain   . 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 Polysubstance abuse     HX of  . S/P colonoscopy 2005    Dr Moss Mc polyp removed, otherwise normal  . Hypertension   . Tobacco abuse   . Intussusception ([place]) 04/2012  . Shortness of breath   . Chronic abdominal pain   . Biliary stone   . Seizures ([place]) 1998    with brain bleed x1. None since then and on no meds  . Chronic flank pain     Past Surgical History  Procedure Laterality Date  . Cholecystectomy  2002  . Percutaneous transhepatic cholangiograhpy and billiary drainage  2002  . Roux-en-y-hepatojejunostomy  2003    revision in 2013 for intussusception [facility] Dr. Bailey Mech)  .  Spinal fusion      C5-C7  . Carpal tunnel release Bilateral 12/2010  . Esophagogastroduodenoscopy    09/10/2003    Small hiatal hernia; otherwise normal stomach, normal D1 and D2  . Colonoscopy    09/10/2003    diminutive polyp in the rectum cold biopsied/removed/ Normal colon  . Esophagogastroduodenoscopy  08/27/2010    [id number] esophagus.  No varices.  Couple of tiny antral erosions of doubtful clinical significance, otherwise normal stomach, D1 and D2.  . Esophagogastroduodenoscopy (egd) with propofol N/A 08/02/2013    RMR: Portal gastropathy. No explanation for abdominal pain which I believe is more abdominal wall in origin. She has been seen by Dr. Carlis Abbott  over at [facility]. She is up-to-date on cross sectional imaging. She has a pain management physician in [place]. Repeat EGD for varices in 3 years.  . Colonoscopy with propofol N/A 08/02/2013    [id number] diverticulosis. next TCS 10 years.  . Biliary stone removal  2015  . Lumbar fusion    . Nose surgery  1970  . Esophagogastroduodenoscopy (egd) with propofol N/A 03/06/2015    [id number] gastric mucosac s/p bx  . Biopsy N/A 03/06/2015    Procedure: BIOPSY;  Surgeon: Daneil Dolin, MD;  Location: [facility];  Service: Endoscopy;  Laterality: N/A;  There were no vitals filed for this visit.  Visit Diagnosis:  Dizziness and giddiness      Subjective Assessment - 10/15/15 1300    Subjective Pt reports no further dizziness. Requesting information on "What to do if the dizziness comes back," per pt.   Pertinent History  Per medical chart: DDD, Hepatitis C, HTN, cirrhosis, PTSD, depression, seizures (last was in 1998).   Per pt, R thalamic CVA (1998).   Patient Stated Goals "To stand up without the room moving."   Currently in Pain? No/denies                Vestibular Assessment - 10/15/15 0001    Occulomotor Exam   Occulomotor Alignment Normal   Positional Testing   Dix-Hallpike --   Sidelying Test Sidelying  Right;Sidelying Left   Horizontal Canal Testing Horizontal Canal Right;Horizontal Canal Left   Sidelying Right   Sidelying Right Duration NA   Sidelying Right Symptoms No nystagmus   Sidelying Left   Sidelying Left Duration NA   Sidelying Left Symptoms No nystagmus   Horizontal Canal Right   Horizontal Canal Right Duration NA   Horizontal Canal Right Symptoms Normal   Horizontal Canal Left   Horizontal Canal Left Duration NA   Horizontal Canal Left Symptoms Normal                 [facility] Adult PT Treatment/Exercise - 10/15/15 0001    Transfers   Transfers Sit to Stand;Stand to Sit   Sit to Stand 7: Independent   Stand to Sit 7: Independent   Ambulation/Gait   Ambulation/Gait Yes   Ambulation/Gait Assistance 7: Independent   Ambulation Distance (Feet) 100 Feet   Assistive device None   Gait Pattern Within Functional Limits   Self-Care   Self-Care Other Self-Care Comments   Other Self-Care Comments  This PT and patient discussed what pt may do if BPPV returns in future. Therapist emphasized importance of seeking immediately medical attention if vertigo occurs at rest and does not change when pt no longer in provoking position. Pt verbalized understanding. Explained and demonstrated Nestor Lewandowsky for habituation with verbal understanding from pt. Pt gave effective return demo of horizontla rolling exercise for [facility] habituation with no cueing from PT. See Pt Instructions for further detail on education provided.                 PT Education - 10/15/15 1301    Education provided Yes   Education Details Habituation HEP to address future symptoms of BPPV, as needed. See Pt Instructions. PT goals, findings, progress, and DC plan.   Person(s) Educated Patient   Methods Explanation;Demonstration;Handout   Comprehension Verbalized understanding;Returned demonstration          PT Short Term Goals - 09/26/15 1313    PT SHORT TERM GOAL #1   Title STG's = LTG's            PT Long Term Goals - 10/15/15 0945    PT LONG TERM GOAL #1   Title Pt will independently perform initial HEP to maximize functional gains made in PT.  (Target date: 10/24/15)   Baseline Met 2/22.   Status Achieved   PT LONG TERM GOAL #2   Title Positional vertigo testing will be negative to indicate resolved BPPV.  (Target date: 10/24/15)   Baseline Met 2/22.   Status Achieved   PT LONG TERM GOAL #3   Title Assess strength and balance, if indicated, after vertigo clears.   (  Target date: 10/24/15)   Status Achieved               Plan - 10/22/15 1310    Clinical Impression Statement At this time, pt positional testing suggests no BBPV and pt reports dizziness/disequiibrium no longer present. No back pain present during this session. Pt reports another provider is addressing back pain at this time. Pt has met all goals and therefore will be disharged from outpatient PT at this time. Pt verbalized understanding and was in full agreement with DC plan.   Consulted and Agree with Plan of Care Patient          G-Codes - 10/22/2015 1308    Functional Assessment Tool Used Clinical judgement;  positional testing asymptomatic, consistent with no presence of BPPV.   Functional Limitation Mobility: Walking and moving around   Mobility: Walking and Moving Around Goal Status [phone]) At least 1 percent but less than 20 percent impaired, limited or restricted   Mobility: Walking and Moving Around Discharge Status [phone]) At least 1 percent but less than 20 percent impaired, limited or restricted      Problem List Patient Active Problem List   Diagnosis Date Noted  . Acute sinusitis with symptoms > 10 days 08/04/2015  . Epigastric mass 04/16/2015  . Abdominal swelling 04/16/2015  . Mucosal abnormality of stomach   . Nausea without vomiting 02/17/2015  . Loss of weight 02/17/2015  . Chronic folliculitis 16/38/4665  . 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 Insomnia 03/12/2014  . Folliculitis  99/35/7017  . Cirrhosis of liver ([place]) 11/15/2013  . Lower extremity edema 11/15/2013  . Abdominal pain, chronic, epigastric 07/10/2013  . Anorexia nervosa 07/10/2013  . 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 Muscle weakness (generalized) 12/28/2011  . CARPAL TUNNEL SYNDROME 10/16/2008  . ABDOMINAL BLOATING 10/16/2008  . Alcohol abuse 04/25/2008  . TOBACCO ABUSE 04/25/2008  . POST TRAUMATIC STRESS SYNDROME 04/22/2008  . DEPRESSION 04/22/2008  . INTRACRANIAL HEMORRHAGE 04/22/2008  . Esophageal reflux 04/22/2008  . INTUSSUSCEPTION 04/22/2008  . ABDOMINAL ADHESIONS 04/22/2008  . Hepatic cirrhosis due to chronic hepatitis C infection ([place]) 04/22/2008  . OBSTRUCTION OF BILE DUCT 04/22/2008  . LOW BACK PAIN, CHRONIC sees [place] [facility]. 04/22/2008  . Abdominal pain 04/22/2008  . ABDOMINAL PAIN OTHER SPECIFIED SITE 04/22/2008  . HEPATITIS C, HX OF 04/22/2008   PHYSICAL THERAPY DISCHARGE SUMMARY  Visits from Start of Care: 3  Current functional level related to goals / functional outcomes: See above goals and goal statuses.   Remaining deficits: Pt continues to report chronic lower back pain; however, no pain present today and pt reports pain clinic is addressing this.    Education / Equipment: HEP for habituation.  Plan: Patient agrees to discharge.  Patient goals were met. Patient is being discharged due to meeting the stated rehab goals.  ?????        Billie Ruddy, PT, DPT [facility] [address] [place], [place], [address] Phone: [phone]   Fax:  [phone] 10/22/2015, 3:57 PM  Name: NASTASHIA GALLO MRN: [id number] Date of Birth: 06/30/1951

## 2015-10-16 ENCOUNTER — Telehealth: Payer: Self-pay | Admitting: Internal Medicine

## 2015-10-16 NOTE — Telephone Encounter (Signed)
I have only been doing GI related PT orders, for chronic abd pain desensitation for abd wall pain.

## 2015-10-16 NOTE — Telephone Encounter (Signed)
Noted  

## 2015-10-16 NOTE — Telephone Encounter (Signed)
Called pt and LMOM.  

## 2015-10-16 NOTE — Telephone Encounter (Signed)
Routing to LSL to verify order

## 2015-10-16 NOTE — Telephone Encounter (Signed)
Pt called to say that we needed to fax her Physical Therapy orders (lumbar spine) to the place in [place] that we referred her to. [phone]

## 2015-10-20 ENCOUNTER — Other Ambulatory Visit: Payer: Self-pay

## 2015-10-20 ENCOUNTER — Telehealth: Payer: Self-pay

## 2015-10-20 ENCOUNTER — Encounter: Payer: Self-pay | Admitting: Physical Therapy

## 2015-10-20 DIAGNOSIS — R109 Unspecified abdominal pain: Secondary | ICD-10-CM

## 2015-10-20 NOTE — Telephone Encounter (Signed)
Error

## 2015-10-20 NOTE — Telephone Encounter (Signed)
Noted referral sent  

## 2015-10-20 NOTE — Telephone Encounter (Signed)
That's fine to send referral for abd wall pain to PT.

## 2015-10-20 NOTE — Telephone Encounter (Signed)
Pt called office and states she told us wrong information about the referral. She only need the abdominal wall pain referral resubmitted.   Routing to LSL for approval

## 2015-10-23 ENCOUNTER — Encounter: Payer: Self-pay | Admitting: Physical Therapy

## 2015-10-25 ENCOUNTER — Other Ambulatory Visit: Payer: Self-pay | Admitting: Obstetrics and Gynecology

## 2015-10-25 ENCOUNTER — Observation Stay (HOSPITAL_COMMUNITY)
Admission: EM | Admit: 2015-10-25 | Discharge: 2015-10-25 | Disposition: A | Discharge status: Home / Self Care | Payer: Medicare Other | Attending: Obstetrics and Gynecology | Admitting: Obstetrics and Gynecology

## 2015-10-25 ENCOUNTER — Encounter (HOSPITAL_COMMUNITY): Payer: Self-pay | Admitting: *Deleted

## 2015-10-25 ENCOUNTER — Observation Stay (HOSPITAL_COMMUNITY): Payer: Medicare Other | Admitting: Anesthesiology

## 2015-10-25 ENCOUNTER — Telehealth (HOSPITAL_BASED_OUTPATIENT_CLINIC_OR_DEPARTMENT_OTHER): Payer: Self-pay | Admitting: Emergency Medicine

## 2015-10-25 ENCOUNTER — Encounter (HOSPITAL_COMMUNITY)
Admission: EM | Disposition: A | Discharge status: Home / Self Care | Payer: Self-pay | Source: Home / Self Care | Attending: Emergency Medicine

## 2015-10-25 DIAGNOSIS — R0602 Shortness of breath: Secondary | ICD-10-CM | POA: Diagnosis not present

## 2015-10-25 DIAGNOSIS — Z807 Family history of other malignant neoplasms of lymphoid, hematopoietic and related tissues: Secondary | ICD-10-CM | POA: Diagnosis not present

## 2015-10-25 DIAGNOSIS — F329 Major depressive disorder, single episode, unspecified: Secondary | ICD-10-CM | POA: Insufficient documentation

## 2015-10-25 DIAGNOSIS — S3140XA Unspecified open wound of vagina and vulva, initial encounter: Secondary | ICD-10-CM | POA: Diagnosis not present

## 2015-10-25 DIAGNOSIS — N930 Postcoital and contact bleeding: Secondary | ICD-10-CM | POA: Diagnosis not present

## 2015-10-25 DIAGNOSIS — Z91030 Bee allergy status: Secondary | ICD-10-CM | POA: Diagnosis not present

## 2015-10-25 DIAGNOSIS — Z8619 Personal history of other infectious and parasitic diseases: Secondary | ICD-10-CM | POA: Diagnosis not present

## 2015-10-25 DIAGNOSIS — R030 Elevated blood-pressure reading, without diagnosis of hypertension: Secondary | ICD-10-CM | POA: Diagnosis not present

## 2015-10-25 DIAGNOSIS — S3769XA Other injury of uterus, initial encounter: Secondary | ICD-10-CM | POA: Diagnosis present

## 2015-10-25 DIAGNOSIS — I10 Essential (primary) hypertension: Secondary | ICD-10-CM | POA: Diagnosis not present

## 2015-10-25 DIAGNOSIS — Z808 Family history of malignant neoplasm of other organs or systems: Secondary | ICD-10-CM | POA: Insufficient documentation

## 2015-10-25 DIAGNOSIS — Z9049 Acquired absence of other specified parts of digestive tract: Secondary | ICD-10-CM | POA: Insufficient documentation

## 2015-10-25 DIAGNOSIS — Z8249 Family history of ischemic heart disease and other diseases of the circulatory system: Secondary | ICD-10-CM | POA: Insufficient documentation

## 2015-10-25 DIAGNOSIS — X58XXXA Exposure to other specified factors, initial encounter: Secondary | ICD-10-CM | POA: Diagnosis not present

## 2015-10-25 DIAGNOSIS — N939 Abnormal uterine and vaginal bleeding, unspecified: Secondary | ICD-10-CM | POA: Diagnosis not present

## 2015-10-25 DIAGNOSIS — F4310 Post-traumatic stress disorder, unspecified: Secondary | ICD-10-CM | POA: Insufficient documentation

## 2015-10-25 DIAGNOSIS — S3763XA Laceration of uterus, initial encounter: Secondary | ICD-10-CM

## 2015-10-25 DIAGNOSIS — G8929 Other chronic pain: Secondary | ICD-10-CM | POA: Diagnosis not present

## 2015-10-25 DIAGNOSIS — Z886 Allergy status to analgesic agent status: Secondary | ICD-10-CM | POA: Diagnosis not present

## 2015-10-25 DIAGNOSIS — S3141XA Laceration without foreign body of vagina and vulva, initial encounter: Principal | ICD-10-CM | POA: Insufficient documentation

## 2015-10-25 DIAGNOSIS — K219 Gastro-esophageal reflux disease without esophagitis: Secondary | ICD-10-CM | POA: Diagnosis not present

## 2015-10-25 DIAGNOSIS — M545 Low back pain: Secondary | ICD-10-CM | POA: Diagnosis not present

## 2015-10-25 HISTORY — PX: REPAIR VAGINAL CUFF: SHX6067

## 2015-10-25 LAB — BASIC METABOLIC PANEL
Anion gap: 8 (ref 5–15)
BUN: 16 mg/dL (ref 6–20)
CO2: 26 mmol/L (ref 22–32)
Calcium: 8.3 mg/dL — ABNORMAL LOW (ref 8.9–10.3)
Chloride: 104 mmol/L (ref 101–111)
Creatinine, Ser: 0.65 mg/dL (ref 0.44–1.00)
GFR calc Af Amer: >60 mL/min (ref >60)
GFR calc non Af Amer: >60 mL/min (ref >60)
Glucose, Bld: 104 mg/dL — ABNORMAL HIGH (ref 65–99)
Potassium: 4.2 mmol/L (ref 3.5–5.1)
Sodium: 138 mmol/L (ref 135–145)

## 2015-10-25 LAB — CBC
HCT: 43.6 % (ref 36.0–46.0)
HCT: 38.4 % (ref 36.0–46.0)
Hemoglobin: 14.2 g/dL (ref 12.0–15.0)
Hemoglobin: 12.5 g/dL (ref 12.0–15.0)
MCH: 31.9 pg (ref 26.0–34.0)
MCH: 31.7 pg (ref 26.0–34.0)
MCHC: 32.6 g/dL (ref 30.0–36.0)
MCHC: 32.6 g/dL (ref 30.0–36.0)
MCV: 98 fL (ref 78.0–100.0)
MCV: 97.5 fL (ref 78.0–100.0)
Platelets: 181 10*3/uL (ref 150–400)
Platelets: 168 10*3/uL (ref 150–400)
RBC: 4.45 MIL/uL (ref 3.87–5.11)
RBC: 3.94 MIL/uL (ref 3.87–5.11)
RDW: 12.7 % (ref 11.5–15.5)
RDW: 12.8 % (ref 11.5–15.5)
WBC: 8.7 10*3/uL (ref 4.0–10.5)
WBC: 12.8 10*3/uL — ABNORMAL HIGH (ref 4.0–10.5)

## 2015-10-25 SURGERY — REPAIR, VAGINAL CUFF
Anesthesia: General | Site: Vagina

## 2015-10-25 MED ORDER — MIDAZOLAM HCL 2 MG/2ML IJ SOLN
INTRAMUSCULAR | Status: AC
  Filled 2015-10-25: qty 2

## 2015-10-25 MED ORDER — SILVER NITRATE-POT NITRATE 75-25 % EX MISC
CUTANEOUS | Status: AC
  Filled 2015-10-25: qty 1

## 2015-10-25 MED ORDER — FENTANYL CITRATE (PF) 100 MCG/2ML IJ SOLN
INTRAMUSCULAR | Status: DC | PRN
  Administered 2015-10-25: 25 ug via INTRAVENOUS
  Administered 2015-10-25 (×2): 50 ug via INTRAVENOUS

## 2015-10-25 MED ORDER — SUCCINYLCHOLINE CHLORIDE 20 MG/ML IJ SOLN
INTRAMUSCULAR | Status: DC | PRN
  Administered 2015-10-25: 100 mg via INTRAVENOUS

## 2015-10-25 MED ORDER — CEFAZOLIN SODIUM-DEXTROSE 2-3 GM-% IV SOLR
2.0000 g | INTRAVENOUS | Status: AC
  Administered 2015-10-25: 2 g via INTRAVENOUS
  Filled 2015-10-25 (×2): qty 50

## 2015-10-25 MED ORDER — FENTANYL CITRATE (PF) 100 MCG/2ML IJ SOLN
INTRAMUSCULAR | Status: AC
  Filled 2015-10-25: qty 2

## 2015-10-25 MED ORDER — PRENATAL MULTIVITAMIN CH
1.0000 | ORAL_TABLET | Freq: Every day | ORAL | Status: DC
  Filled 2015-10-25 (×3): qty 1

## 2015-10-25 MED ORDER — TRAMADOL HCL 50 MG PO TABS: 50.0000 mg | ORAL_TABLET | Freq: Four times a day (QID) | ORAL | Status: DC | PRN

## 2015-10-25 MED ORDER — ONDANSETRON HCL 4 MG/2ML IJ SOLN
INTRAMUSCULAR | Status: AC
  Filled 2015-10-25: qty 2

## 2015-10-25 MED ORDER — SODIUM CHLORIDE 0.9 % IJ SOLN
INTRAMUSCULAR | Status: AC
  Filled 2015-10-25: qty 10

## 2015-10-25 MED ORDER — POVIDONE-IODINE 10 % EX SOLN
CUTANEOUS | Status: AC
  Administered 2015-10-25: 11:00:00
  Filled 2015-10-25: qty 118

## 2015-10-25 MED ORDER — CEFAZOLIN SODIUM-DEXTROSE 2-3 GM-% IV SOLR
INTRAVENOUS | Status: AC
  Filled 2015-10-25: qty 50

## 2015-10-25 MED ORDER — BUPIVACAINE-EPINEPHRINE (PF) 0.5% -1:200000 IJ SOLN
INTRAMUSCULAR | Status: AC
  Filled 2015-10-25: qty 30

## 2015-10-25 MED ORDER — OXYCODONE HCL 5 MG PO TABS
10.0000 mg | ORAL_TABLET | Freq: Once | ORAL | Status: AC
  Administered 2015-10-25: 10 mg via ORAL
  Filled 2015-10-25: qty 2

## 2015-10-25 MED ORDER — ARTIFICIAL TEARS OP OINT
TOPICAL_OINTMENT | OPHTHALMIC | Status: AC
  Filled 2015-10-25: qty 3.5

## 2015-10-25 MED ORDER — EPHEDRINE SULFATE 50 MG/ML IJ SOLN
INTRAMUSCULAR | Status: AC
  Filled 2015-10-25: qty 1

## 2015-10-25 MED ORDER — LIDOCAINE HCL (PF) 1 % IJ SOLN
INTRAMUSCULAR | Status: AC
  Filled 2015-10-25: qty 5

## 2015-10-25 MED ORDER — BUPIVACAINE-EPINEPHRINE (PF) 0.5% -1:200000 IJ SOLN
INTRAMUSCULAR | Status: DC | PRN
  Administered 2015-10-25: 8 mL

## 2015-10-25 MED ORDER — DEXTROSE 5 % IV SOLN
1.0000 g | Freq: Once | INTRAVENOUS | Status: AC
  Administered 2015-10-25: 1 g via INTRAVENOUS
  Filled 2015-10-25: qty 10

## 2015-10-25 MED ORDER — PROPOFOL 10 MG/ML IV BOLUS
INTRAVENOUS | Status: AC
  Filled 2015-10-25: qty 20

## 2015-10-25 MED ORDER — ONDANSETRON HCL 4 MG/2ML IJ SOLN
INTRAMUSCULAR | Status: DC | PRN
  Administered 2015-10-25: 4 mg via INTRAVENOUS

## 2015-10-25 MED ORDER — GLYCOPYRROLATE 0.2 MG/ML IJ SOLN
INTRAMUSCULAR | Status: DC | PRN
  Administered 2015-10-25: 0.2 mg via INTRAVENOUS

## 2015-10-25 MED ORDER — LIDOCAINE HCL (CARDIAC) 20 MG/ML IV SOLN
INTRAVENOUS | Status: DC | PRN
  Administered 2015-10-25: 50 mg via INTRAVENOUS

## 2015-10-25 MED ORDER — METOPROLOL TARTRATE 1 MG/ML IV SOLN
INTRAVENOUS | Status: DC | PRN
Start: 2015-10-25 — End: 2015-10-25
  Administered 2015-10-25 (×2): 2.5 mg via INTRAVENOUS

## 2015-10-25 MED ORDER — LIDOCAINE-EPINEPHRINE (PF) 2 %-1:200000 IJ SOLN
INTRAMUSCULAR | Status: AC
  Filled 2015-10-25: qty 20

## 2015-10-25 MED ORDER — MIDAZOLAM HCL 5 MG/5ML IJ SOLN
INTRAMUSCULAR | Status: DC | PRN
  Administered 2015-10-25: 2 mg via INTRAVENOUS

## 2015-10-25 MED ORDER — LIDOCAINE-EPINEPHRINE (PF) 1 %-1:200000 IJ SOLN
INTRAMUSCULAR | Status: AC
  Filled 2015-10-25: qty 10

## 2015-10-25 MED ORDER — LIDOCAINE HCL (PF) 1 % IJ SOLN
INTRAMUSCULAR | Status: AC
  Filled 2015-10-25: qty 30

## 2015-10-25 MED ORDER — SODIUM CHLORIDE 0.9 % IV SOLN
INTRAVENOUS | Status: DC
  Administered 2015-10-25 (×2): via INTRAVENOUS

## 2015-10-25 MED ORDER — HYDROMORPHONE HCL 1 MG/ML IJ SOLN
0.5000 mg | INTRAMUSCULAR | Status: DC | PRN
Start: 2015-10-25 — End: 2015-10-25
  Administered 2015-10-25: 0.5 mg via INTRAVENOUS
  Filled 2015-10-25: qty 1

## 2015-10-25 MED ORDER — LACTATED RINGERS IV SOLN
INTRAVENOUS | Status: DC | PRN
  Administered 2015-10-25: 15:00:00 via INTRAVENOUS

## 2015-10-25 MED ORDER — SODIUM CHLORIDE 0.9 % IV SOLN
Freq: Once | INTRAVENOUS | Status: AC
  Administered 2015-10-25: 04:00:00 via INTRAVENOUS

## 2015-10-25 MED ORDER — SUCCINYLCHOLINE CHLORIDE 20 MG/ML IJ SOLN
INTRAMUSCULAR | Status: AC
  Filled 2015-10-25: qty 1

## 2015-10-25 MED ORDER — PROPOFOL 10 MG/ML IV BOLUS
INTRAVENOUS | Status: DC | PRN
  Administered 2015-10-25: 150 mg via INTRAVENOUS

## 2015-10-25 SURGICAL SUPPLY — 21 items
CLOTH BEACON ORANGE TIMEOUT ST (SAFETY) ×1 IMPLANT
COVER LIGHT HANDLE STERIS (MISCELLANEOUS) ×2 IMPLANT
DECANTER SPIKE VIAL GLASS SM (MISCELLANEOUS) ×1 IMPLANT
ELECT REM PT RETURN 9FT ADLT (ELECTROSURGICAL) ×2
ELECTRODE REM PT RTRN 9FT ADLT (ELECTROSURGICAL) IMPLANT
GLOVE BIOGEL PI IND STRL 7.0 (GLOVE) IMPLANT
GLOVE BIOGEL PI IND STRL 9 (GLOVE) IMPLANT
GLOVE BIOGEL PI INDICATOR 7.0 (GLOVE) ×1
GLOVE BIOGEL PI INDICATOR 9 (GLOVE) ×1
GLOVE ECLIPSE 6.5 STRL STRAW (GLOVE) ×1 IMPLANT
GLOVE ECLIPSE 9.0 STRL (GLOVE) ×1 IMPLANT
GLOVE EXAM NITRILE MD LF STRL (GLOVE) ×1 IMPLANT
GOWN STRL REUS W/TWL 2XL LVL3 (GOWN DISPOSABLE) ×1 IMPLANT
GOWN STRL REUS W/TWL LRG LVL3 (GOWN DISPOSABLE) ×1 IMPLANT
INST SET MINOR GENERAL (KITS) ×1 IMPLANT
KIT ROOM TURNOVER AP CYSTO (KITS) ×1 IMPLANT
MANIFOLD NEPTUNE II (INSTRUMENTS) ×1 IMPLANT
PACK PERI GYN (CUSTOM PROCEDURE TRAY) ×1 IMPLANT
PAD ARMBOARD 7.5X6 YLW CONV (MISCELLANEOUS) ×1 IMPLANT
SET BASIN LINEN APH (SET/KITS/TRAYS/PACK) ×1 IMPLANT
SUT CHROMIC 2 0 CT 1 (SUTURE) ×2 IMPLANT

## 2015-10-25 NOTE — ED Notes (Signed)
Dr. Glo Herring to be here at 9 am.

## 2015-10-25 NOTE — Discharge Instructions (Signed)
Follow up closely with gynecology for further evaluation.  Return to ER if you develop extreme fatigue, lightheaded, pass out, fevers or other concerns.  If you were given medicines take as directed.  If you are on coumadin or contraceptives realize their levels and effectiveness is altered by many different medicines.  If you have any reaction (rash, tongues swelling, other) to the medicines stop taking and see a physician.    If your blood pressure was elevated in the ER make sure you follow up for management with a primary doctor or return for chest pain, shortness of breath or stroke symptoms.  Please follow up as directed and return to the ER or see a physician for new or worsening symptoms.  Thank you. Filed Vitals:   10/25/15 0044  BP: 124/84  Pulse: 85  Temp: 98.5 F (36.9 C)  TempSrc: Oral  Resp: 18  Height: 5\' 5"  (1.651 m)  SpO2: 96%

## 2015-10-25 NOTE — ED Provider Notes (Addendum)
CSN: [id number]     Arrival date & time 10/25/15  0030 History  By signing my name below, I, Sabrina Wyatt, attest that this documentation has been prepared under the direction and in the presence of Sabrina Wyatt . Electronically Signed: Dora Wyatt, Wyatt. 10/25/2015. 12:41 AM.    Chief Complaint  Patient presents with  . Vaginal Bleeding    HPI Comments: Pt complains of vaginal bleeding  Patient is a 65 y.o. female presenting with vaginal bleeding. The history is provided by the patient. No language interpreter was used.  Vaginal Bleeding Quality:  Bright red and clots Severity:  Moderate Onset quality:  Sudden Duration:  1 hour Timing:  Constant Progression:  Unable to specify Chronicity:  New Menstrual history:  Postmenopausal Context: after intercourse   Relieved by:  None tried Worsened by:  Nothing tried Associated symptoms: no abdominal pain and no dysuria      HPI Comments: Sabrina Wyatt is a 65 y.o. female who presents to the Emergency Department complaining of sudden onset, constant, vaginal bleeding for the last hour s/p sexual intercourse. Pt notes that she has never experienced similar vaginal bleeding before. She states that she went through menopause in 1998. Her last pap smear was two months ago with Dr. Glo Herring; she reports that the results were clear. Pt has not taken any new medications recently; she does not use blood thinners, aspirin, or Plavix. Pt has not had any recent vaginal issues; she notes that she had a ruptured tubal pregnancy in 1998. Pt uses oxycodone currently; she was in a pain management institute but discontinued treatment about two weeks ago. She has taken been taking pain medications since her surgery in 2002. Pt denies lightheadedness, numbness, weakness, abdominal pain, vomiting, dysuria or any other associated symptoms at this time.   Past Medical History  Diagnosis Date  . GERD (gastroesophageal reflux disease)   . Cirrhosis,  hepatitis C     CT on 04/21/15= no HCC, pt has been vaccinated for Hep A and Hep B. s/p treatment of HCV with eradication  . Depression   . Carpal tunnel syndrome   . S/P endoscopy 08/27/10    antral erosions, otherwise normal, due egd 08/2012 to screen for varices  . Abdominal wall pain     chronic  . Pulmonary nodules   . PTSD (post-traumatic stress disorder)   . Chronic low back pain   . 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 Polysubstance abuse     HX of  . S/P colonoscopy 2005    Dr Moss Mc polyp removed, otherwise normal  . Hypertension   . Tobacco abuse   . Intussusception ([place]) 04/2012  . Shortness of breath   . Chronic abdominal pain   . Biliary stone   . Seizures ([place]) 1998    with brain bleed x1. None since then and on no meds  . Chronic flank pain    Past Surgical History  Procedure Laterality Date  . Cholecystectomy  2002  . Percutaneous transhepatic cholangiograhpy and billiary drainage  2002  . Roux-en-y-hepatojejunostomy  2003    revision in 2013 for intussusception [facility] Dr. Bailey Mech)  . Spinal fusion      C5-C7  . Carpal tunnel release Bilateral 12/2010  . Esophagogastroduodenoscopy    09/10/2003    Small hiatal hernia; otherwise normal stomach, normal D1 and D2  . Colonoscopy    09/10/2003    diminutive polyp in the rectum cold biopsied/removed/  Normal colon  . Esophagogastroduodenoscopy  08/27/2010    [id number] esophagus.  No varices.  Couple of tiny antral erosions of doubtful clinical significance, otherwise normal stomach, D1 and D2.  . Esophagogastroduodenoscopy (egd) with propofol N/A 08/02/2013    RMR: Portal gastropathy. No explanation for abdominal pain which I believe is more abdominal wall in origin. She has been seen by Dr. Carlis Abbott  over at [facility]. She is up-to-date on cross sectional imaging. She has a pain management physician in [place]. Repeat EGD for varices in 3 years.  . Colonoscopy with propofol N/A 08/02/2013     [id number] diverticulosis. next TCS 10 years.  . Biliary stone removal  2015  . Lumbar fusion    . Nose surgery  1970  . Esophagogastroduodenoscopy (egd) with propofol N/A 03/06/2015    [id number] gastric mucosac s/p bx  . Biopsy N/A 03/06/2015    Procedure: BIOPSY;  Surgeon: Daneil Dolin, Wyatt;  Location: [facility];  Service: Endoscopy;  Laterality: N/A;   Family History  Problem Relation Age of Onset  . Coronary artery disease Brother   . Cancer Sister     lymphoma  . Cancer Father     brain   Social History  Substance Use Topics  . Smoking status: Current Some Day Smoker -- 0.25 packs/day for 30 years    Types: Cigarettes  . Smokeless tobacco: Never Used     Comment: One pack every 6-7 days  . Alcohol Use: Yes     Comment: occasionally    OB History    No data available     Review of Systems  Gastrointestinal: Negative for vomiting and abdominal pain.  Genitourinary: Positive for vaginal bleeding. Negative for dysuria.  Neurological: Negative for weakness, light-headedness and numbness.  All other systems reviewed and are negative.     Allergies  Bee venom and Tylenol  Home Medications   Prior to Admission medications   Medication Sig Start Date End Date Taking? Authorizing Provider  acyclovir (ZOVIRAX) 400 MG tablet TAKE AS DIRECTED. Patient taking differently: TAKE ONE TABLET BY MOUTH TWICE DAILY 05/01/15   Jonnie Kind, Wyatt  albuterol (PROVENTIL) (2.5 MG/3ML) 0.083% nebulizer solution Take 2.5 mg by nebulization 2 (two) times daily.     Historical Provider, Wyatt  amoxicillin-clavulanate (AUGMENTIN) 875-125 MG tablet Take 1 tablet by mouth 2 (two) times daily. 08/04/15   Mahala Menghini, PA-C  atenolol (TENORMIN) 25 MG tablet Take 25 mg by mouth every morning.     Historical Provider, Wyatt  Cholecalciferol (VITAMIN D-3) 1000 UNITS CAPS Take 1,000 capsules by mouth daily.    Historical Provider, Wyatt  DULoxetine (CYMBALTA) 60 MG capsule Take 60 mg by mouth 2 (two) times  daily.     Historical Provider, Wyatt  EPIPEN 2-PAK 0.3 MG/0.3ML SOAJ injection Inject 0.3 mg into the skin as needed (allergic reactions).  03/04/13   Historical Provider, Wyatt  estradiol (ESTRACE VAGINAL) 0.1 MG/GM vaginal cream Place [id number] Applicatorfuls vaginally at bedtime. 09/05/15   Jonnie Kind, Wyatt  etodolac (LODINE) 400 MG tablet Take 400 mg by mouth 2 (two) times daily.  04/13/12   Historical Provider, Wyatt  ferrous sulfate 325 (65 FE) MG tablet Take 325 mg by mouth daily with breakfast.    Historical Provider, Wyatt  fluticasone (FLONASE) 50 MCG/ACT nasal spray Place 2 sprays into the nose daily.      Historical Provider, Wyatt  gentamicin (GARAMYCIN) 0.3 % ophthalmic solution Place 1 drop into both eyes daily as  needed (FOR ITCHY EYES).  07/03/15   Historical Provider, Wyatt  loratadine (CLARITIN) 10 MG tablet Take 10 mg by mouth daily.    Historical Provider, Wyatt  meclizine (ANTIVERT) 25 MG tablet  07/27/15   Historical Provider, Wyatt  Omega-3 Fatty Acids (FISH OIL PO) Take 2 capsules by mouth daily. Reported on 09/26/2015    Historical Provider, Wyatt  omeprazole (PRILOSEC) 20 MG capsule TAKE ONE CAPSULE BY MOUTH EVERY DAY 09/10/12   Andria Meuse, NP  Oxycodone HCl 10 MG TABS Take 1 tablet (10 mg total) by mouth 3 (three) times daily as needed. 09/05/15   Jonnie Kind, Wyatt  predniSONE (DELTASONE) 10 MG tablet 6, 5, 4, 3, 2 then 1 tablet by mouth daily for 6 days total. Patient not taking: Reported on 09/26/2015 09/04/15   Evalee Jefferson, PA-C  PROAIR HFA 108 (90 BASE) MCG/ACT inhaler Inhale 2 puffs into the lungs every 6 (six) hours as needed for wheezing or shortness of breath.  02/21/12   Historical Provider, Wyatt  pyridOXINE (VITAMIN B-6) 100 MG tablet Take 100 mg by mouth daily.    Historical Provider, Wyatt  tiZANidine (ZANAFLEX) 4 MG tablet Take 4 mg by mouth 2 (two) times daily.  07/26/15   Historical Provider, Wyatt   BP 111/79 mmHg  Pulse 79  Temp(Src) 98.5 F (36.9 C) (Oral)  Resp 18  Ht 5\' 5"  (1.651 m)  SpO2  98% Physical Exam  Constitutional: She is oriented to person, place, and time. She appears well-developed and well-nourished. No distress.  HENT:  Head: Normocephalic and atraumatic.  Eyes: Conjunctivae and EOM are normal.  Conjunctiva are not pale  Neck: Neck supple. No tracheal deviation present.  Cardiovascular: Normal rate.   Pulmonary/Chest: Effort normal. No respiratory distress.  Abdominal: Soft. There is no tenderness.  Genitourinary:  Persistent mild bleeding with clots, laceration lateral aspect of cervix at 8 'oclock  Musculoskeletal: Normal range of motion. She exhibits no edema.       Right knee: She exhibits no swelling.       Left knee: She exhibits no swelling.  Neurological: She is alert and oriented to person, place, and time.  Skin: Skin is warm and dry.  Psychiatric: She has a normal mood and affect. Her behavior is normal.  Nursing note and vitals reviewed.   ED Course  Procedures (including critical care time)  DIAGNOSTIC STUDIES: Oxygen Saturation is 96% on RA, adequate by my interpretation.    COORDINATION OF CARE: 12:42 AM Will order CBC and pelvic cart. Discussed treatment plan with pt at bedside and pt agreed to plan.   Labs Review Labs Reviewed  CBC    Imaging Review No results found. I have personally reviewed and evaluated these images and lab results as part of my medical decision-making.   EKG Interpretation None      MDM   Final diagnoses:  Vaginal bleeding  Cervical laceration, initial encounter   Patient presents with vaginal bleeding for which she has not had an almost 20 years. Laceration seen on exam.  Gynecology paged Despite packing with gauze pt continued to bleed.   Spoke with Dr. Glo Herring who will see the patient at 9:00 in the morning in the ER to assess and likely repair laceration. Patient having intermittent bleeding since arrival. Multiple reassessments. Nothing by mouth.    Medications  0.9 %  sodium  chloride infusion ( Intravenous New Bag/Given 10/25/15 0359)  oxyCODONE (Oxy IR/ROXICODONE) immediate release tablet 10 mg (10  mg Oral Given 10/25/15 0427)    Filed Vitals:   10/25/15 0044 10/25/15 0400  BP: 124/84 111/79  Pulse: 85 79  Temp: 98.5 F (36.9 C)   TempSrc: Oral   Resp: 18 18  Height: 5\' 5"  (1.651 m)   SpO2: 96% 98%    Final diagnoses:  Vaginal bleeding  Cervical laceration, initial encounter       Sabrina Wyatt 10/25/15 [id number]  Sabrina Wyatt 10/26/15 [phone]

## 2015-10-25 NOTE — ED Notes (Signed)
Pt had sex about an hour and a half ago and just 30 mins ago pt began having bright red bleeding with clots. Pt states she has been through menopause and hasn't had a period since 1998.

## 2015-10-25 NOTE — ED Notes (Signed)
Pt transported to OR by CRNA and OR RN.

## 2015-10-25 NOTE — H&P (Signed)
[facility] Visit  Patient name: Sabrina Wyatt MRN [id number]  Date of birth: 1951-06-13  CC & HPI:  Sabrina Wyatt is a 65 y.o. female presenting today for vaginal laceration with sex. Pt had minor pain only. Efforts torepair in the ED were unsuccessful. Pt has continued to bleed despite suturing and vaginal packing..  ROS:  seee hpi  Pertinent History Reviewed:   Reviewed: Significant for as  Below. Medical         Past Medical History  Diagnosis Date  . GERD (gastroesophageal reflux disease)   . Cirrhosis, hepatitis C     CT on 04/21/15= no HCC, pt has been vaccinated for Hep A and Hep B. s/p treatment of HCV with eradication  . Depression   . Carpal tunnel syndrome   . S/P endoscopy 08/27/10    antral erosions, otherwise normal, due egd 08/2012 to screen for varices  . Abdominal wall pain     chronic  . Pulmonary nodules   . PTSD (post-traumatic stress disorder)   . Chronic low back pain   . 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 Polysubstance abuse     HX of  . S/P colonoscopy 2005    Dr Moss Mc polyp removed, otherwise normal  . Hypertension   . Tobacco abuse   . Intussusception ([place]) 04/2012  . Shortness of breath   . Chronic abdominal pain   . Biliary stone   . Seizures ([place]) 1998    with brain bleed x1. None since then and on no meds  . Chronic flank pain                               Surgical Hx:    Past Surgical History  Procedure Laterality Date  . Cholecystectomy  2002  . Percutaneous transhepatic cholangiograhpy and billiary drainage  2002  . Roux-en-y-hepatojejunostomy  2003    revision in 2013 for intussusception [facility] Dr. Bailey Mech)  . Spinal fusion      C5-C7  . Carpal tunnel release Bilateral 12/2010  . Esophagogastroduodenoscopy    09/10/2003    Small hiatal hernia; otherwise normal stomach, normal D1 and D2  . Colonoscopy    09/10/2003    diminutive polyp in the rectum cold biopsied/removed/ Normal colon   . Esophagogastroduodenoscopy  08/27/2010    [id number] esophagus.  No varices.  Couple of tiny antral erosions of doubtful clinical significance, otherwise normal stomach, D1 and D2.  . Esophagogastroduodenoscopy (egd) with propofol N/A 08/02/2013    RMR: Portal gastropathy. No explanation for abdominal pain which I believe is more abdominal wall in origin. She has been seen by Dr. Carlis Abbott  over at [facility]. She is up-to-date on cross sectional imaging. She has a pain management physician in [place]. Repeat EGD for varices in 3 years.  . Colonoscopy with propofol N/A 08/02/2013    [id number] diverticulosis. next TCS 10 years.  . Biliary stone removal  2015  . Lumbar fusion    . Nose surgery  1970  . Esophagogastroduodenoscopy (egd) with propofol N/A 03/06/2015    [id number] gastric mucosac s/p bx  . Biopsy N/A 03/06/2015    Procedure: BIOPSY;  Surgeon: Daneil Dolin, MD;  Location: [facility];  Service: Endoscopy;  Laterality: N/A;   Medications: Reviewed & Updated - see associated section  No current facility-administered medications for this visit.  Current outpatient prescriptions:  .  acyclovir (ZOVIRAX) 400 MG tablet, TAKE AS DIRECTED. (Patient taking differently: TAKE ONE TABLET BY MOUTH TWICE DAILY), Disp: 40 tablet, Rfl: 11 .  albuterol (PROVENTIL) (2.5 MG/3ML) 0.083% nebulizer solution, Take 2.5 mg by nebulization every 6 (six) hours as needed for wheezing or shortness of breath. , Disp: , Rfl:  .  ALPRAZolam (XANAX) 0.5 MG tablet, Take 0.5 mg by mouth at bedtime., Disp: , Rfl:  .  amoxicillin-clavulanate (AUGMENTIN) 875-125 MG tablet, Take 1 tablet by mouth 2 (two) times daily. (Patient not taking: Reported on 10/25/2015), Disp: 14 tablet, Rfl: 0 .  atenolol (TENORMIN) 25 MG tablet, Take 25 mg by mouth every morning. , Disp: , Rfl:  .  Cholecalciferol (VITAMIN D-3) 1000 UNITS CAPS, Take 1,000 capsules by mouth daily., Disp: , Rfl:  .  DULoxetine (CYMBALTA) 60 MG  capsule, Take 60 mg by mouth 2 (two) times daily. , Disp: , Rfl:  .  EPIPEN 2-PAK 0.3 MG/0.3ML SOAJ injection, Inject 0.3 mg into the skin as needed (allergic reactions). , Disp: , Rfl:  .  estradiol (ESTRACE VAGINAL) 0.1 MG/GM vaginal cream, Place [id number] Applicatorfuls vaginally at bedtime. (Patient not taking: Reported on 10/25/2015), Disp: 42.5 g, Rfl: 2 .  etodolac (LODINE) 400 MG tablet, Take 400 mg by mouth 2 (two) times daily. , Disp: , Rfl:  .  ferrous sulfate 325 (65 FE) MG tablet, Take 325 mg by mouth daily with breakfast., Disp: , Rfl:  .  fluticasone (FLONASE) 50 MCG/ACT nasal spray, Place 2 sprays into the nose daily.  , Disp: , Rfl:  .  gentamicin (GARAMYCIN) 0.3 % ophthalmic solution, Place 1 drop into both eyes daily as needed (FOR ITCHY EYES). , Disp: , Rfl:  .  levocetirizine (XYZAL) 5 MG tablet, Take 5 mg by mouth daily., Disp: , Rfl:  .  loratadine (CLARITIN) 10 MG tablet, Take 10 mg by mouth daily., Disp: , Rfl:  .  Omega-3 Fatty Acids (FISH OIL PO), Take 2 capsules by mouth daily. Reported on 09/26/2015, Disp: , Rfl:  .  omeprazole (PRILOSEC) 20 MG capsule, TAKE ONE CAPSULE BY MOUTH EVERY DAY, Disp: 30 capsule, Rfl: 11 .  Oxycodone HCl 10 MG TABS, Take 1 tablet (10 mg total) by mouth 3 (three) times daily as needed. (Patient taking differently: Take 10 mg by mouth 3 (three) times daily as needed (pain). ), Disp: 30 tablet, Rfl: 0 .  predniSONE (DELTASONE) 10 MG tablet, 6, 5, 4, 3, 2 then 1 tablet by mouth daily for 6 days total. (Patient not taking: Reported on 09/26/2015), Disp: 21 tablet, Rfl: 0 .  PROAIR HFA 108 (90 BASE) MCG/ACT inhaler, Inhale 2 puffs into the lungs every 6 (six) hours as needed for wheezing or shortness of breath. , Disp: , Rfl:  .  pyridOXINE (VITAMIN B-6) 100 MG tablet, Take 100 mg by mouth daily., Disp: , Rfl:  .  tiZANidine (ZANAFLEX) 4 MG tablet, Take 4 mg by mouth 2 (two) times daily. , Disp: , Rfl:   Facility-Administered Medications Ordered in Other  Visits:  .  0.9 %  sodium chloride infusion, , Intravenous, Continuous, Jonnie Kind, MD, Last Rate: 100 mL/hr at 10/25/15 1132 .  HYDROmorphone (DILAUDID) injection 0.5 mg, 0.5 mg, Intravenous, Q2H PRN, Mallory Shirk V, MD, 0.5 mg at 10/25/15 1130 .  povidone-iodine (BETADINE) 10 % external solution, , , ,  .  prenatal multivitamin tablet 1 tablet,  1 tablet, Oral, Q1200, Mallory Shirk V, MD .  silver nitrate applicators [id number] % applicator, , , ,  .  traMADol (ULTRAM) tablet 50 mg, 50 mg, Oral, Q6H PRN, Jonnie Kind, MD   Social History: Reviewed -  reports that she has been smoking Cigarettes.  She has a 7.5 pack-year smoking history. She has never used smokeless tobacco.  Objective Findings:  Vitals: There were no vitals taken for this visit. There were no vitals taken for this visit.  There were no vitals taken for this visit.   Physical Examination: General appearance - alert, well appearing, and in no distress, oriented to person, place, and time, overweight and ill-appearing Mental status - alert, oriented to person, place, and time Eyes - pupils equal and reactive, extraocular eye movements intact, sclera anicteric Mouth - mucous membranes moist, pharynx normal without lesions Lymphatics - no palpable lymphadenopathy, no hepatosplenomegaly Abdomen - soft, nontender, nondistended, no masses or organomegaly Breasts - breasts appear normal, no suspicious masses, no skin or nipple changes or axillary nodes Rectal - normal rectal, no masses Extremities - peripheral pulses normal, no pedal edema, no clubbing or cyanosis CBC Latest Ref Rng 10/25/2015 10/25/2015 08/01/2015  WBC 4.0 - 10.5 K/uL 12.8(H) 8.7 9.2  Hemoglobin 12.0 - 15.0 g/dL 12.5 14.2 14.3  Hematocrit 36.0 - 46.0 % 38.4 43.6 44.1  Platelets 150 - 400 K/uL 168 181 150     Assessment & Plan:   A:  1. Persistent vaginal bleeding s/p postcoital laceration P:  1. 1. NPO     2 . To OR around 2 pm      \

## 2015-10-25 NOTE — ED Notes (Signed)
Attempted to give report, RN unavailable.

## 2015-10-25 NOTE — ED Notes (Signed)
Floor RN called to get report on pt. RN notified that pt was now going to OR. Report given to floor RN. OR RN at bedside ready to transport pt to OR.

## 2015-10-25 NOTE — Consult Note (Signed)
Reason for Consult:vaginal laceration with coitus. Referring Physician: ED  Sabrina Wyatt is an 65 y.o. female. Presenting at 3 am with postcoital laceration behind cervix. Continued slow oozing.Has passed several large clots.  Continued to ooze, and I was able to close the laceration from 8 oclock to 4 oclock behind cervix with running 2-0 plain. Pt had vaginal pack placed, betadine soaked, and will be observed x 8 hrs-16hrs with packing in place before removal of packing.  Pertinent Gynecological History: Menses: post-menopausal Bleeding:  Contraception:  DES exposure:  Blood transfusions: no Sexually transmitted diseases: no past history Previous GYN Procedures:   LMenstrual History: Menarche age:  No LMP recorded. Patient is postmenopausal.    Past Medical History  Diagnosis Date  . GERD (gastroesophageal reflux disease)   . Cirrhosis, hepatitis C     CT on 04/21/15= no HCC, pt has been vaccinated for Hep A and Hep B. s/p treatment of HCV with eradication  . Depression   . Carpal tunnel syndrome   . S/P endoscopy 08/27/10    antral erosions, otherwise normal, due egd 08/2012 to screen for varices  . Abdominal wall pain     chronic  . Pulmonary nodules   . PTSD (post-traumatic stress disorder)   . Chronic low back pain   . 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 Polysubstance abuse     HX of  . S/P colonoscopy 2005    Dr Moss Mc polyp removed, otherwise normal  . Hypertension   . Tobacco abuse   . Intussusception ([place]) 04/2012  . Shortness of breath   . Chronic abdominal pain   . Biliary stone   . Seizures ([place]) 1998    with brain bleed x1. None since then and on no meds  . Chronic flank pain     Past Surgical History  Procedure Laterality Date  . Cholecystectomy  2002  . Percutaneous transhepatic cholangiograhpy and billiary drainage  2002  . Roux-en-y-hepatojejunostomy  2003    revision in 2013 for intussusception [facility] Dr. Bailey Mech)  .  Spinal fusion      C5-C7  . Carpal tunnel release Bilateral 12/2010  . Esophagogastroduodenoscopy    09/10/2003    Small hiatal hernia; otherwise normal stomach, normal D1 and D2  . Colonoscopy    09/10/2003    diminutive polyp in the rectum cold biopsied/removed/ Normal colon  . Esophagogastroduodenoscopy  08/27/2010    [id number] esophagus.  No varices.  Couple of tiny antral erosions of doubtful clinical significance, otherwise normal stomach, D1 and D2.  . Esophagogastroduodenoscopy (egd) with propofol N/A 08/02/2013    RMR: Portal gastropathy. No explanation for abdominal pain which I believe is more abdominal wall in origin. She has been seen by Dr. Carlis Abbott  over at [facility]. She is up-to-date on cross sectional imaging. She has a pain management physician in [place]. Repeat EGD for varices in 3 years.  . Colonoscopy with propofol N/A 08/02/2013    [id number] diverticulosis. next TCS 10 years.  . Biliary stone removal  2015  . Lumbar fusion    . Nose surgery  1970  . Esophagogastroduodenoscopy (egd) with propofol N/A 03/06/2015    [id number] gastric mucosac s/p bx  . Biopsy N/A 03/06/2015    Procedure: BIOPSY;  Surgeon: Daneil Dolin, MD;  Location: [facility];  Service: Endoscopy;  Laterality: N/A;    Family History  Problem Relation Age of Onset  . Coronary artery disease Brother   . Cancer  Sister     lymphoma  . Cancer Father     brain    Social History:  reports that she has been smoking Cigarettes.  She has a 7.5 pack-year smoking history. She has never used smokeless tobacco. She reports that she drinks alcohol. She reports that she does not use illicit drugs.  Allergies:  Allergies  Allergen Reactions  . Bee Venom Hives  . Tylenol [Acetaminophen] Nausea And Vomiting and Swelling    Swelling of hands and feet    Medications: I have reviewed the patient's current medications.  ROS  Blood pressure 125/89, pulse 80, temperature 98.2 F (36.8 C), temperature source  Oral, resp. rate 20, height 5\' 5"  (1.651 m), SpO2 95 %. Physical Exam  Results for orders placed or performed during the hospital encounter of 10/25/15 (from the past 48 hour(s))  CBC     Status: None   Collection Time: 10/25/15  1:17 AM  Result Value Ref Range   WBC 8.7 4.0 - 10.5 K/uL   RBC 4.45 3.87 - 5.11 MIL/uL   Hemoglobin 14.2 12.0 - 15.0 g/dL   HCT 43.6 36.0 - 46.0 %   MCV 98.0 78.0 - 100.0 fL   MCH 31.9 26.0 - 34.0 pg   MCHC 32.6 30.0 - 36.0 g/dL   RDW 12.7 11.5 - 15.5 %   Platelets 181 150 - 400 K/uL    No results found.  Assessment/Plan: Postcoital laceration of vag apex, repaired in ED. Due to slight oozing, will observe x 8-16 hours before removing vag pack.  Jonnie Kind 10/25/2015

## 2015-10-25 NOTE — Anesthesia Procedure Notes (Signed)
Procedure Name: Intubation Date/Time: 10/25/2015 3:18 PM Performed by: Andree Elk, AMY A Pre-anesthesia Checklist: Patient identified, Patient being monitored, Timeout performed, Emergency Drugs available and Suction available Patient Re-evaluated:Patient Re-evaluated prior to inductionOxygen Delivery Method: Circle System Utilized Preoxygenation: Pre-oxygenation with 100% oxygen Intubation Type: IV induction, Rapid sequence and Cricoid Pressure applied Ventilation: Mask ventilation without difficulty Laryngoscope Size: 3 and Miller Grade View: Grade I Tube type: Oral Tube size: 7.0 mm Number of attempts: 1 Airway Equipment and Method: Stylet Placement Confirmation: ETT inserted through vocal cords under direct vision,  positive ETCO2 and breath sounds checked- equal and bilateral Secured at: 21 cm Tube secured with: Tape Dental Injury: Teeth and Oropharynx as per pre-operative assessment

## 2015-10-25 NOTE — ED Notes (Signed)
Nurse called to room to evaluate vaginal bleeding. Pt got up to urinate and reports her vaginal packing started falling out and she was bleeding again. Pt's bed pad was covered in blood, along with her gown. Pt was holding towel between her legs and it was halfway soaked with blood. Dr. Glo Herring paged and notified of pt's bleeding. Order given to hold pt in ED until ready for pt to trasport to OR, probably around 1400 today. ED charge RN notified of MD orders.

## 2015-10-25 NOTE — Progress Notes (Signed)
Preoperative diagnosis vaginal laceration, with bleeding, postcoital Postoperative diagnosis: Same Procedure repair of vaginal laceration Surgeon Glo Herring Asst. [place] RNFA anesthesia Gen., Amy Complications none findings 1 cm deep laceration from 4:00 to 9:00 around the posterior fornix, deepest on the patient's right side at 9:00 with clots and active bleeding, failing to respond to surgical correction in the emergency room earlier Details of procedure. Indications Sabrina Wyatt had presented to the emergency room this morning with post coital vaginal laceration with continued bleeding. Vaginal packing and suture attempts in the emergency room were not satisfactory and the patient continued to bleed after coughing was taken to the OR for more definitive closure. Excellent details of procedure patient was taken operating room prepped and draped for general anesthesia, vagina prepped and draped, and its open sided speculum inserted in the vagina. Previous sutures performed in the emergency room were taken out. The laceration did not enter into the pelvis. The laceration was full-thickness skin incision, laceration extending to 9:00. They began at 9:00 and reapproximated the circumferential laceration around the cul-de-sac with a continuous running 2-0 chromic closure. 2 additional sutures were required and 9:00 to complete tissue approximation. Patient had received antibiotics and timeout prior to beginning the procedure. Slightly completion of suturing resulted in good hemostasis. Patient will be allowed to go home from recovery room

## 2015-10-25 NOTE — Transfer of Care (Signed)
Immediate Anesthesia Transfer of Care Note  Patient: Sabrina Wyatt  Procedure(s) Performed: Procedure(s): REPAIR VAGINAL LACERATION  (N/A)  Patient Location: PACU  Anesthesia Type:General  Level of Consciousness: awake, alert , oriented and patient cooperative  Airway & Oxygen Therapy: Patient Spontanous Breathing and Patient connected to face mask oxygen  Post-op Assessment: Report given to RN and Post -op Vital signs reviewed and stable  Post vital signs: Reviewed and stable  Last Vitals:  Filed Vitals:   10/25/15 1330 10/25/15 1400  BP: 138/86 141/80  Pulse: 90 87  Temp:    Resp:      Complications: No apparent anesthesia complications

## 2015-10-25 NOTE — ED Notes (Signed)
Pt's depend was saturated with blood. Old depend removed and new one placed on pt. Depend had to be used due to the large amt of vaginal bleeding.

## 2015-10-25 NOTE — Anesthesia Preprocedure Evaluation (Addendum)
Anesthesia Evaluation  Patient identified by MRN, date of birth, ID band Patient awake    Reviewed: Allergy & Precautions, H&P , NPO status , Patient's Chart, lab work & pertinent test results  History of Anesthesia Complications (+) AWARENESS UNDER ANESTHESIA  Airway Mallampati: II  TM Distance: >3 FB Neck ROM: full    Dental  (+) Edentulous Upper, Edentulous Lower   Pulmonary shortness of breath, Current Smoker,    breath sounds clear to auscultation       Cardiovascular hypertension, On Home Beta Blockers  Rhythm:regular Rate:Normal     Neuro/Psych Well Controlled,     GI/Hepatic GERD  Medicated,(+) C  Endo/Other    Renal/GU      Musculoskeletal   Abdominal   Peds  Hematology   Anesthesia Other Findings   Reproductive/Obstetrics                            Anesthesia Physical Anesthesia Plan  ASA: III and emergent  Anesthesia Plan: General ETT, Rapid Sequence and Cricoid Pressure   Post-op Pain Management:    Induction: Intravenous, Rapid sequence and Cricoid pressure planned  Airway Management Planned:   Additional Equipment:   Intra-op Plan:   Post-operative Plan: Extubation in OR  Informed Consent:   Dental Advisory Given  Plan Discussed with: Surgeon and Anesthesiologist  Anesthesia Plan Comments:        Anesthesia Quick Evaluation

## 2015-10-25 NOTE — Anesthesia Postprocedure Evaluation (Signed)
Anesthesia Post Note  Patient: [name]  Procedure(s) Performed: Procedure(s) (LRB): REPAIR VAGINAL LACERATION  (N/A)  Patient location during evaluation: PACU Anesthesia Type: General Level of consciousness: awake and alert and oriented Pain management: pain level controlled Vital Signs Assessment: post-procedure vital signs reviewed and stable Respiratory status: spontaneous breathing, respiratory function stable and patient connected to face mask oxygen Cardiovascular status: stable Postop Assessment: no signs of nausea or vomiting Anesthetic complications: no    Last Vitals:  Filed Vitals:   10/25/15 1400 10/25/15 1600  BP: 141/80   Pulse: 87   Temp:  37 C  Resp:      Last Pain:  Filed Vitals:   10/25/15 1620  PainSc: 8                  [name]

## 2015-10-26 NOTE — Op Note (Signed)
Please see operative note dictated in the chart 11/01/2015

## 2015-10-28 ENCOUNTER — Encounter (HOSPITAL_COMMUNITY): Payer: Self-pay | Admitting: Obstetrics and Gynecology

## 2015-10-28 DIAGNOSIS — G894 Chronic pain syndrome: Secondary | ICD-10-CM | POA: Diagnosis not present

## 2015-10-28 DIAGNOSIS — F1120 Opioid dependence, uncomplicated: Secondary | ICD-10-CM | POA: Diagnosis not present

## 2015-10-28 DIAGNOSIS — G890 Central pain syndrome: Secondary | ICD-10-CM | POA: Diagnosis not present

## 2015-11-01 NOTE — Brief Op Note (Signed)
10/25/2015  8:05 AM  PATIENT:  Sabrina Wyatt  65 y.o. female  PRE-OPERATIVE DIAGNOSIS:  vaginal laceration  POST-OPERATIVE DIAGNOSIS:  vaginal laceration  PROCEDURE:  Procedure(s): REPAIR VAGINAL LACERATION  (N/A)  SURGEON:  Surgeon(s) and Role:    * Jonnie Kind, MD - Primary  PHYSICIAN ASSISTANT:   ASSISTANTS: none   ANESTHESIA:   general  EBL:     BLOOD ADMINISTERED:none  DRAINS: none   LOCAL MEDICATIONS USED:  NONE  SPECIMEN:  No Specimen  DISPOSITION OF SPECIMEN:  N/A  COUNTS:  YES  TOURNIQUET:  * No tourniquets in log *  DICTATION: .Dragon Dictation  PLAN OF CARE: Discharge to home after PACU  PATIENT DISPOSITION:  PACU - hemodynamically stable.   Delay start of Pharmacological VTE agent (>24hrs) due to surgical blood loss or risk of bleeding: not applicable

## 2015-11-01 NOTE — Op Note (Signed)
10/25/2015  8:05 AM  PATIENT:  Sabrina Wyatt  65 y.o. female  PRE-OPERATIVE DIAGNOSIS:  vaginal laceration  POST-OPERATIVE DIAGNOSIS:  vaginal laceration  PROCEDURE:  Procedure(s): REPAIR VAGINAL LACERATION  (N/A)  SURGEON:  Surgeon(s) and Role:    * Jonnie Kind, MD - Primary  PHYSICIAN ASSISTANT:   ASSISTANTS: none   ANESTHESIA:   general  EBL:     BLOOD ADMINISTERED:none  DRAINS: none   LOCAL MEDICATIONS USED:  NONE  SPECIMEN:  No Specimen  DISPOSITION OF SPECIMEN:  N/A  COUNTS:  YES  TOURNIQUET:  * No tourniquets in log *  DICTATION: .Dragon Dictation  PLAN OF CARE: Discharge to home after PACU  PATIENT DISPOSITION:  PACU - hemodynamically stable.   Delay start of Pharmacological VTE agent (>24hrs) due to surgical blood loss or risk of bleeding: not applicable  Indications post coital laceration unable to be adequately controlled by emergency room room attempts at closure by me after consultation, taken operating room for adequate allergies you and access to the patient has been noted to have a large lengthy superficial laceration of the vagina extending from 4:00 behind the cervix around to 9:00, relatively deep on the left side and bleeding that persisted despite attempted closure in the emergency room Details of procedure: Patient was taken operating room prepped and draped for vaginal procedure with removal of the vaginal packing. Inspection of the cuff showed that there was small bit of oozing but it was almost completely controlled vaginal packing having been removed, the previous superficial layer of sutures which only extended to about 7:00 limited access to the lateral fornix was then taken out. The cuff was cleansed and then a continuous running 2-0 chromic closure of the vaginal apex laceration was performed beginning at 4:00 and circumferentially placing a running around the posterior vaginal laceration, with inspection revealing adequate hemostasis.  Antibiotics were administered, at the start of the case and timeout had been conducted. Patient remained stable and was adequate the hemostatic for discharge home for follow-up in 2 weeks for office for an inspection of repair

## 2015-11-01 NOTE — Discharge Summary (Signed)
Physician Discharge Summary  Patient ID: Sabrina Wyatt MRN: [id number] DOB/AGE: [birth year] 65 y.o.  Admit date: 10/25/2015 Discharge date: 11/01/2015  Admission Diagnoses: Vaginal bleeding, vaginal laceration status post coitus  Discharge Diagnoses: Vaginal laceration, repaired Active Problems:   Vaginal laceration   Discharged Condition: good  Hospital Course: Patient seen in the emergency room for post coital vaginal laceration, had attempted closure in the emergency room with residual bleeding necessitating transfer to the operating room for closure. Patient had surgical closure with continuous running suture and good hemostasis was discharged home from recovery room  Consults: gynecology  Significant Diagnostic Studies: labs:  CBC Latest Ref Rng 10/25/2015 10/25/2015 08/01/2015  WBC 4.0 - 10.5 K/uL 12.8(H) 8.7 9.2  Hemoglobin 12.0 - 15.0 g/dL 12.5 14.2 14.3  Hematocrit 36.0 - 46.0 % 38.4 43.6 44.1  Platelets 150 - 400 K/uL 168 181 150      Treatments: surgery: Repair vaginal laceration  Discharge Exam: Blood pressure 131/76, pulse 86, temperature 98.6 F (37 C), temperature source Oral, resp. rate 17, height 5\' 5"  (1.651 m), SpO2 93 %. General appearance: alert, cooperative and appears older than stated age GI: soft, non-tender; bowel sounds normal; no masses,  no organomegaly Pelvic: cervix normal in appearance, external genitalia normal and Laceration reapproximated with good hemostasis  Disposition: 01-Home or Self Care  Discharge Instructions    Call MD for:  persistant nausea and vomiting    Complete by:  As directed      Call MD for:  severe uncontrolled pain    Complete by:  As directed      Call MD for:  severe uncontrolled pain    Complete by:  As directed      Call MD for:  temperature >100.4    Complete by:  As directed      Call MD for:  temperature >100.4    Complete by:  As directed      Diet - low sodium heart healthy    Complete by:  As directed       Diet - low sodium heart healthy    Complete by:  As directed      Discharge instructions    Complete by:  As directed   Avoid sexual activity. Follow-up visit in 2 weeks with need to be performed prior to consideration of sexual activity. May resume using Estrace vaginal cream     Increase activity slowly    Complete by:  As directed      Increase activity slowly    Complete by:  As directed             Medication List    STOP taking these medications        loratadine 10 MG tablet  Commonly known as:  CLARITIN      TAKE these medications        acyclovir 400 MG tablet  Commonly known as:  ZOVIRAX  TAKE AS DIRECTED.     ALPRAZolam 0.5 MG tablet  Commonly known as:  XANAX  Take 0.5 mg by mouth at bedtime.     amoxicillin-clavulanate 875-125 MG tablet  Commonly known as:  AUGMENTIN  Take 1 tablet by mouth 2 (two) times daily.     atenolol 25 MG tablet  Commonly known as:  TENORMIN  Take 25 mg by mouth every morning.     DULoxetine 60 MG capsule  Commonly known as:  CYMBALTA  Take 60 mg by mouth 2 (two) times daily.  EPIPEN 2-PAK 0.3 mg/0.3 mL Soaj injection  Generic drug:  EPINEPHrine  Inject 0.3 mg into the skin as needed (allergic reactions).     estradiol 0.1 MG/GM vaginal cream  Commonly known as:  ESTRACE VAGINAL  Place [id number] Applicatorfuls vaginally at bedtime.     etodolac 400 MG tablet  Commonly known as:  LODINE  Take 400 mg by mouth 2 (two) times daily.     ferrous sulfate 325 (65 FE) MG tablet  Take 325 mg by mouth daily with breakfast.     FISH OIL PO  Take 2 capsules by mouth daily. Reported on 09/26/2015     fluticasone 50 MCG/ACT nasal spray  Commonly known as:  FLONASE  Place 2 sprays into the nose daily.     gentamicin 0.3 % ophthalmic solution  Commonly known as:  GARAMYCIN  Place 1 drop into both eyes daily as needed (FOR ITCHY EYES).     levocetirizine 5 MG tablet  Commonly known as:  XYZAL  Take 5 mg by mouth daily.      omeprazole 20 MG capsule  Commonly known as:  PRILOSEC  TAKE ONE CAPSULE BY MOUTH EVERY DAY     Oxycodone HCl 10 MG Tabs  Take 1 tablet (10 mg total) by mouth 3 (three) times daily as needed.     predniSONE 10 MG tablet  Commonly known as:  DELTASONE  6, 5, 4, 3, 2 then 1 tablet by mouth daily for 6 days total.     PROAIR HFA 108 (90 Base) MCG/ACT inhaler  Generic drug:  albuterol  Inhale 2 puffs into the lungs every 6 (six) hours as needed for wheezing or shortness of breath.     albuterol (2.5 MG/3ML) 0.083% nebulizer solution  Commonly known as:  PROVENTIL  Take 2.5 mg by nebulization every 6 (six) hours as needed for wheezing or shortness of breath.     pyridOXINE 100 MG tablet  Commonly known as:  VITAMIN B-6  Take 100 mg by mouth daily.     tiZANidine 4 MG tablet  Commonly known as:  ZANAFLEX  Take 4 mg by mouth 2 (two) times daily.     traMADol 50 MG tablet  Commonly known as:  ULTRAM  Take 1 tablet (50 mg total) by mouth every 6 (six) hours as needed for moderate pain.     Vitamin D-3 1000 units Caps  Take 1,000 capsules by mouth daily.           Follow-up Information    Follow up with Jonnie Kind, MD. Call in 2 days.   Specialties:  Obstetrics and Gynecology, Radiology   Contact information:   [place] [address] [phone]       Signed: Jonnie Kind 11/01/2015, 8:24 AM

## 2015-11-10 ENCOUNTER — Ambulatory Visit (INDEPENDENT_AMBULATORY_CARE_PROVIDER_SITE_OTHER): Payer: Medicare Other | Admitting: Obstetrics and Gynecology

## 2015-11-10 ENCOUNTER — Encounter: Payer: Self-pay | Admitting: Obstetrics and Gynecology

## 2015-11-10 VITALS — BP 124/68 | HR 64 | Ht 65.0 in | Wt 150.5 lb

## 2015-11-10 DIAGNOSIS — S3141XD Laceration without foreign body of vagina and vulva, subsequent encounter: Secondary | ICD-10-CM

## 2015-11-10 DIAGNOSIS — Z9889 Other specified postprocedural states: Secondary | ICD-10-CM

## 2015-11-10 NOTE — Progress Notes (Signed)
Patient ID: KATHYRIA ESSIEN, female   DOB: May 26, 1951, 65 y.o.   MRN: [id number]    Subjective:  JEN EVATT is a 65 y.o. female now 2 weeks status post repair vag laceration.     Review of Systems Negative except    Diet:   reg   Bowel movements : normal.  The patient is not having any pain.  Objective:  BP 124/68 mmHg  Pulse 64  Ht 5\' 5"  (1.651 m)  Wt 150 lb 8 oz (68.266 kg)  BMI 25.04 kg/m2 General:Well developed, well nourished.  No acute distress. Abdomen: Bowel sounds normal, soft, non-tender. Pelvic Exam:    External Genitalia:  Normal.    Vagina: Normal well healed    Cervix: Normal    Uterus: Normal    Adnexa/Bimanual: Normal  Incision(s):   Healing completed, no drainage, no erythema, no hernia, no swelling, no dehiscence,     Assessment:  Post-Op 2 weeks s/p vag lac   full healing  Doing well postoperatively.   Plan:  1.Wound care discussed   2. . current medications.none 3. Activity restrictions: none 4. return to work: now. 5. Follow up in  prn.

## 2015-11-13 ENCOUNTER — Ambulatory Visit: Payer: Self-pay | Admitting: Physical Therapy

## 2015-11-14 ENCOUNTER — Telehealth: Payer: Self-pay | Admitting: Acute Care

## 2015-11-14 DIAGNOSIS — F17210 Nicotine dependence, cigarettes, uncomplicated: Secondary | ICD-10-CM

## 2015-11-17 ENCOUNTER — Encounter (HOSPITAL_COMMUNITY): Payer: Self-pay

## 2015-11-17 ENCOUNTER — Other Ambulatory Visit: Payer: Self-pay | Admitting: Acute Care

## 2015-11-17 DIAGNOSIS — F17210 Nicotine dependence, cigarettes, uncomplicated: Secondary | ICD-10-CM

## 2015-11-17 NOTE — Therapy (Signed)
[place], [place], [address] Phone: [phone]   Fax:  [phone]  Patient Details  Name: BETHANIE BLOXOM MRN: [id number] Date of Birth: Jan 03, 1951 Referring Provider:  No ref. provider found  Encounter Date: 11/17/2015  OCCUPATIONAL THERAPY DISCHARGE SUMMARY  Visits from Start of Care: 2  Patient was last seen on 01/14/12 and did not return to after last session. Patient is officially discharged from OT to lack of attendance.   Plan: Patient agrees to discharge.  Patient goals were not met. Patient is being discharged due to not returning since the last visit.  ?????       Ailene Ravel, OTR/L,CBIS  [phone]  11/17/2015, 11:43 AM  [place], [place], [address] Phone: [phone]   Fax:  [phone]

## 2015-11-17 NOTE — Telephone Encounter (Signed)
Routing to Lung Nodule Pool

## 2015-11-17 NOTE — Telephone Encounter (Signed)
Called spoke with pt discussing lung cancer screening/questionnaire. Pt does qualify for the program. SDMV scheduled for 11/24/15.  LDCT as been ordered as well.

## 2015-11-20 ENCOUNTER — Ambulatory Visit: Payer: Medicare Other | Attending: Anesthesiology | Admitting: Physical Therapy

## 2015-11-20 ENCOUNTER — Encounter: Payer: Self-pay | Admitting: Physical Therapy

## 2015-11-20 DIAGNOSIS — M545 Low back pain, unspecified: Secondary | ICD-10-CM

## 2015-11-20 NOTE — Therapy (Signed)
[facility] [place], [place], [address] Phone: [phone]   Fax:  [phone]  Physical Therapy Evaluation  Patient Details  Name: Sabrina Wyatt MRN: [id number] Date of Birth: 1950-11-13 Referring Provider: Dr. [name] Fava  Encounter Date: 11/20/2015      PT End of Session - 11/20/15 1101    Visit Number 1   Number of Visits 8   Date for PT Re-Evaluation 01/01/16   Authorization Type UHC Medicare - GCodes required   PT Start Time 1102   PT Stop Time 1145   PT Time Calculation (min) 43 min   Activity Tolerance Patient tolerated treatment well   Behavior During Therapy [facility] for tasks assessed/performed      Past Medical History  Diagnosis Date  . GERD (gastroesophageal reflux disease)   . Cirrhosis, hepatitis C     CT on 04/21/15= no HCC, pt has been vaccinated for Hep A and Hep B. s/p treatment of HCV with eradication  . Depression   . Carpal tunnel syndrome   . S/P endoscopy 08/27/10    antral erosions, otherwise normal, due egd 08/2012 to screen for varices  . Abdominal wall pain     chronic  . Pulmonary nodules   . PTSD (post-traumatic stress disorder)   . Chronic low back pain   . Intracranial hemorrhage ([place]) 1998    left thalmic  . Polysubstance abuse     HX of  . S/P colonoscopy 2005    Dr Moss Mc polyp removed, otherwise normal  . Hypertension   . Tobacco abuse   . Intussusception ([place]) 04/2012  . Shortness of breath   . Chronic abdominal pain   . Biliary stone   . Seizures ([place]) 1998    with brain bleed x1. None since then and on no meds  . Chronic flank pain     Past Surgical History  Procedure Laterality Date  . Cholecystectomy  2002  . Percutaneous transhepatic cholangiograhpy and billiary drainage  2002  . Roux-en-y-hepatojejunostomy  2003    revision in 2013 for intussusception [facility] Dr. Bailey Mech)  . Spinal fusion      C5-C7  . Carpal tunnel release Bilateral 12/2010  .  Esophagogastroduodenoscopy    09/10/2003    Small hiatal hernia; otherwise normal stomach, normal D1 and D2  . Colonoscopy    09/10/2003    diminutive polyp in the rectum cold biopsied/removed/ Normal colon  . Esophagogastroduodenoscopy  08/27/2010    [id number] esophagus.  No varices.  Couple of tiny antral erosions of doubtful clinical significance, otherwise normal stomach, D1 and D2.  . Esophagogastroduodenoscopy (egd) with propofol N/A 08/02/2013    RMR: Portal gastropathy. No explanation for abdominal pain which I believe is more abdominal wall in origin. She has been seen by Dr. Carlis Abbott  over at [facility]. She is up-to-date on cross sectional imaging. She has a pain management physician in [place]. Repeat EGD for varices in 3 years.  . Colonoscopy with propofol N/A 08/02/2013    [id number] diverticulosis. next TCS 10 years.  . Biliary stone removal  2015  . Lumbar fusion    . Nose surgery  1970  . Esophagogastroduodenoscopy (egd) with propofol N/A 03/06/2015    [id number] gastric mucosac s/p bx  . Biopsy N/A 03/06/2015    Procedure: BIOPSY;  Surgeon: Daneil Dolin, MD;  Location: [facility];  Service: Endoscopy;  Laterality: N/A;  . Repair vaginal cuff N/A 10/25/2015    Procedure: REPAIR VAGINAL LACERATION ;  Surgeon: Jonnie Kind, MD;  Location: [facility];  Service: Gynecology;  Laterality: N/A;    There were no vitals filed for this visit.  Visit Diagnosis:  Left-sided low back pain without sciatica - Plan: PT plan of care cert/re-cert      Subjective Assessment - 11/20/15 1056    Subjective Patient complains of ongoing back pain due to having to stand in slight flexion. She continues to wear low back brace for support. She wears a TENS unit about 3x/day. Main c/o of left sided LBP.   Pertinent History  Per medical chart: DDD, Hepatitis C, HTN, cirrhosis, PTSD, depression, seizures (last was in 1998).   Per pt, R thalamic CVA (1998). Lumbar fusion L4/5.   Limitations  Sitting;Standing   How long can you sit comfortably? 15 min   How long can you stand comfortably? 20-25 min   How long can you walk comfortably? 20-25 min   Patient Stated Goals decrease pain, improve movement   Currently in Pain? Yes   Pain Score 9    Pain Location Back   Pain Orientation Left;Lower   Pain Descriptors / Indicators Stabbing   Pain Type Chronic pain   Pain Onset More than a month ago   Pain Frequency Constant   Aggravating Factors  too much standing and walking   Pain Relieving Factors meds, massage, heat/cold   Effect of Pain on Daily Activities limited            [facility] PT Assessment - 11/20/15 0001    Assessment   Medical Diagnosis muscle spasm; lumbago   Referring Provider Dr. [name] Fava   Onset Date/Surgical Date 08/23/01   Next MD Visit 11/28/15   Prior Therapy yes   Precautions   Precautions --   Precaution Comments --   Required Braces or Orthoses Other Brace/Splint   Other Brace/Splint lumbar brace for compression   Restrictions   Weight Bearing Restrictions No   Balance Screen   Has the patient fallen in the past 6 months No   Has the patient had a decrease in activity level because of a fear of falling?  Yes   Is the patient reluctant to leave their home because of a fear of falling?  No   Home Environment   Living Environment Private residence   Living Arrangements Alone   Type of National Access Stairs to enter   Entrance Stairs-Number of Steps 3   Entrance Stairs-Rails Right   Home Layout One level   Prior Function   Level of Independence Independent   Vocation On disability   Observation/Other Assessments   Focus on Therapeutic Outcomes (FOTO)  74% limited   Sensation   Additional Comments Normal B Pat. tendon reflexes   ROM / Strength   AROM / PROM / Strength AROM;Strength   AROM   AROM Assessment Site Lumbar   Lumbar Flexion full   Lumbar Extension -15 deg   Lumbar - Right Side Bend 75%   Lumbar - Left Side Bend WFL    Lumbar - Left Rotation 50   Strength   Overall Strength Comments R hip flex 4/5, L 4-/5; Knee flex R 4+/5, L 4/5; knee ext and ankle DF 5/5   Palpation   Palpation comment Left gluteals along iliac crest, Left QL increased tone of left paraspinals   Special Tests    Special Tests Lumbar  Absent achilles tendon reflexes B  PT Short Term Goals - 09/26/15 1313    PT SHORT TERM GOAL #1   Title STG's = LTG's           PT Long Term Goals - 11-24-15 1156    PT LONG TERM GOAL #1   Title I with HEP   Time 6   Period Weeks   Status New   PT LONG TERM GOAL #2   Title Decreased pain in low back by 50% with ADLS.   Time 6   Period Weeks   Status New   PT LONG TERM GOAL #3   Title Improved sitting and walking tolerance to 30 minutes.               Plan - 2015/11/24 1145    Clinical Impression Statement Patient presents with c/o of left LBP. She has active and latent trigger points in left gluteal muscles and L QL. She can abolish pain with sitting in a B FABER position and lumbar flexion is most comfortable, but she is limited with sitting to 15 min.   Pt will benefit from skilled therapeutic intervention in order to improve on the following deficits Decreased range of motion;Pain;Postural dysfunction;Decreased strength;Decreased activity tolerance   Rehab Potential Fair   PT Frequency 2x / week   PT Duration 6 weeks   PT Treatment/Interventions Electrical Stimulation;Moist Heat;Therapeutic exercise;Ultrasound;Neuromuscular re-education;Patient/family education;Manual techniques;Dry needling   PT Next Visit Plan Dry needling when available; [place] to let gluteals/QL; STW to same. Core and LE strenghthening.   Consulted and Agree with Plan of Care Patient          G-Codes - 11-24-15 1200    Functional Assessment Tool Used FOTO 74% LIMITED   Functional Limitation Mobility: Walking and moving around   Mobility: Walking and Moving  Around Current Status [phone]) At least 60 percent but less than 80 percent impaired, limited or restricted   Mobility: Walking and Moving Around Goal Status [phone]) At least 40 percent but less than 60 percent impaired, limited or restricted       Problem List Patient Active Problem List   Diagnosis Date Noted  . Vaginal laceration 10/25/2015  . Acute sinusitis with symptoms > 10 days 08/04/2015  . Epigastric mass 04/16/2015  . Abdominal swelling 04/16/2015  . Mucosal abnormality of stomach   . Nausea without vomiting 02/17/2015  . Loss of weight 02/17/2015  . Chronic folliculitis [id number]  . 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 Insomnia 03/12/2014  . Folliculitis [id number]  . Cirrhosis of liver ([place]) 11/15/2013  . Lower extremity edema 11/15/2013  . Abdominal pain, chronic, epigastric 07/10/2013  . Anorexia nervosa 07/10/2013  . 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 Muscle weakness (generalized) 12/28/2011  . CARPAL TUNNEL SYNDROME 10/16/2008  . ABDOMINAL BLOATING 10/16/2008  . Alcohol abuse 04/25/2008  . TOBACCO ABUSE 04/25/2008  . POST TRAUMATIC STRESS SYNDROME 04/22/2008  . DEPRESSION 04/22/2008  . INTRACRANIAL HEMORRHAGE 04/22/2008  . Esophageal reflux 04/22/2008  . INTUSSUSCEPTION 04/22/2008  . ABDOMINAL ADHESIONS 04/22/2008  . Hepatic cirrhosis due to chronic hepatitis C infection ([place]) 04/22/2008  . OBSTRUCTION OF BILE DUCT 04/22/2008  . LOW BACK PAIN, CHRONIC sees [facility]. 04/22/2008  . Abdominal pain 04/22/2008  . ABDOMINAL PAIN OTHER SPECIFIED SITE 04/22/2008  . HEPATITIS C, HX OF 04/22/2008   Madelyn Flavors PT 2015-11-24, 12:10 PM  [facility] [place],  [place], [address] Phone: [phone]   Fax:  [phone]  Name: DELTHA VOELZ MRN: [id number] Date of Birth: 02-Aug-1951

## 2015-11-24 ENCOUNTER — Encounter: Payer: Self-pay | Admitting: Acute Care

## 2015-11-24 ENCOUNTER — Inpatient Hospital Stay: Admission: RE | Admit: 2015-11-24 | Payer: Self-pay | Source: Ambulatory Visit

## 2015-11-24 ENCOUNTER — Telehealth: Payer: Self-pay | Admitting: Acute Care

## 2015-11-25 DIAGNOSIS — Z5181 Encounter for therapeutic drug level monitoring: Secondary | ICD-10-CM | POA: Diagnosis not present

## 2015-11-25 DIAGNOSIS — Z79899 Other long term (current) drug therapy: Secondary | ICD-10-CM | POA: Diagnosis not present

## 2015-11-25 NOTE — Telephone Encounter (Signed)
Pt aware that someone will be contacting her to reschedule her appt with [facility] Pt currently has a head cold and is very congested.  Will send to Lung Nodule Pool to advise

## 2015-11-27 DIAGNOSIS — M79671 Pain in right foot: Secondary | ICD-10-CM | POA: Diagnosis not present

## 2015-11-27 DIAGNOSIS — M79642 Pain in left hand: Secondary | ICD-10-CM | POA: Diagnosis not present

## 2015-11-27 DIAGNOSIS — M542 Cervicalgia: Secondary | ICD-10-CM | POA: Diagnosis not present

## 2015-11-27 DIAGNOSIS — M79672 Pain in left foot: Secondary | ICD-10-CM | POA: Diagnosis not present

## 2015-11-27 DIAGNOSIS — M79641 Pain in right hand: Secondary | ICD-10-CM | POA: Diagnosis not present

## 2015-11-27 DIAGNOSIS — Z79899 Other long term (current) drug therapy: Secondary | ICD-10-CM | POA: Diagnosis not present

## 2015-11-28 ENCOUNTER — Encounter: Payer: Self-pay | Admitting: *Deleted

## 2015-12-02 NOTE — Telephone Encounter (Signed)
lmtcb x1 for pt. 

## 2015-12-03 NOTE — Telephone Encounter (Signed)
Pt cb [phone]

## 2015-12-03 NOTE — Telephone Encounter (Signed)
Pt returned call

## 2015-12-04 NOTE — Telephone Encounter (Signed)
Routed to Lung Nodule pool for scheduling.

## 2015-12-08 ENCOUNTER — Ambulatory Visit: Payer: Medicare Other | Attending: Anesthesiology | Admitting: Physical Therapy

## 2015-12-08 DIAGNOSIS — M545 Low back pain, unspecified: Secondary | ICD-10-CM

## 2015-12-08 NOTE — Therapy (Signed)
[facility] [place], [place], [address] Phone: [phone]   Fax:  [phone]  Physical Therapy Treatment  Patient Details  Name: Sabrina Wyatt MRN: [id number] Date of Birth: March 26, 1951 Referring Provider: Dr. [name] Fava  Encounter Date: 12/08/2015      PT End of Session - 12/08/15 1438    Visit Number 2   Number of Visits 8   Date for PT Re-Evaluation 01/01/16   Authorization Type UHC Medicare - GCodes required   PT Start Time 1607   PT Stop Time 1522   PT Time Calculation (min) 45 min   Activity Tolerance Patient tolerated treatment well   Behavior During Therapy [facility] for tasks assessed/performed      Past Medical History  Diagnosis Date  . GERD (gastroesophageal reflux disease)   . Cirrhosis, hepatitis C     CT on 04/21/15= no HCC, pt has been vaccinated for Hep A and Hep B. s/p treatment of HCV with eradication  . Depression   . Carpal tunnel syndrome   . S/P endoscopy 08/27/10    antral erosions, otherwise normal, due egd 08/2012 to screen for varices  . Abdominal wall pain     chronic  . Pulmonary nodules   . PTSD (post-traumatic stress disorder)   . Chronic low back pain   . Intracranial hemorrhage ([place]) 1998    left thalmic  . Polysubstance abuse     HX of  . S/P colonoscopy 2005    Dr Moss Mc polyp removed, otherwise normal  . Hypertension   . Tobacco abuse   . Intussusception ([place]) 04/2012  . Shortness of breath   . Chronic abdominal pain   . Biliary stone   . Seizures ([place]) 1998    with brain bleed x1. None since then and on no meds  . Chronic flank pain     Past Surgical History  Procedure Laterality Date  . Cholecystectomy  2002  . Percutaneous transhepatic cholangiograhpy and billiary drainage  2002  . Roux-en-y-hepatojejunostomy  2003    revision in 2013 for intussusception [facility] Dr. Bailey Mech)  . Spinal fusion      C5-C7  . Carpal tunnel release Bilateral 12/2010  .  Esophagogastroduodenoscopy    09/10/2003    Small hiatal hernia; otherwise normal stomach, normal D1 and D2  . Colonoscopy    09/10/2003    diminutive polyp in the rectum cold biopsied/removed/ Normal colon  . Esophagogastroduodenoscopy  08/27/2010    [id number] esophagus.  No varices.  Couple of tiny antral erosions of doubtful clinical significance, otherwise normal stomach, D1 and D2.  . Esophagogastroduodenoscopy (egd) with propofol N/A 08/02/2013    RMR: Portal gastropathy. No explanation for abdominal pain which I believe is more abdominal wall in origin. She has been seen by Dr. Carlis Abbott  over at [facility]. She is up-to-date on cross sectional imaging. She has a pain management physician in [place]. Repeat EGD for varices in 3 years.  . Colonoscopy with propofol N/A 08/02/2013    [id number] diverticulosis. next TCS 10 years.  . Biliary stone removal  2015  . Lumbar fusion    . Nose surgery  1970  . Esophagogastroduodenoscopy (egd) with propofol N/A 03/06/2015    [id number] gastric mucosac s/p bx  . Biopsy N/A 03/06/2015    Procedure: BIOPSY;  Surgeon: Daneil Dolin, MD;  Location: [facility];  Service: Endoscopy;  Laterality: N/A;  . Repair vaginal cuff N/A 10/25/2015    Procedure: REPAIR VAGINAL LACERATION ;  Surgeon: Jonnie Kind, MD;  Location: [facility];  Service: Gynecology;  Laterality: N/A;    There were no vitals filed for this visit.      Subjective Assessment - 12/08/15 1440    Subjective Patient states it feels like someone has kicked me in the back.   Pertinent History  Per medical chart: DDD, Hepatitis C, HTN, cirrhosis, PTSD, depression, seizures (last was in 1998).   Per pt, R thalamic CVA (1998). Lumbar fusion L4/5.   Limitations Sitting;Standing   How long can you sit comfortably? 15 min   How long can you stand comfortably? 20-25 min   How long can you walk comfortably? 20-25 min   Patient Stated Goals decrease pain, improve movement   Currently in Pain? Yes    Pain Score 10-Worst pain ever   Pain Location Back   Pain Orientation Left;Lower   Pain Descriptors / Indicators Stabbing   Pain Type Chronic pain   Pain Onset More than a month ago   Pain Frequency Constant   Aggravating Factors  too much standing and walking   Pain Relieving Factors meds, massage, heat/cold   Effect of Pain on Daily Activities limited                         [facility] Adult PT Treatment/Exercise - 12/08/15 0001    Self-Care   Self-Care Other Self-Care Comments   Other Self-Care Comments  Patient educated regarding TPDN precautions; possible responses and outcomes.   Modalities   Modalities Moist Heat   Moist Heat Therapy   Number Minutes Moist Heat 20 Minutes   Moist Heat Location Other (comment)  abdomen   Manual Therapy   Manual Therapy Soft tissue mobilization;Myofascial release   Soft tissue mobilization lumbar paraspinals and gluteals   Myofascial Release lumbar/gluts          Trigger Point Dry Needling - 12/08/15 1531    Consent Given? Yes   Education Handout Provided Yes   Muscles Treated Lower Body Gluteus maximus  lumbar multifidus, paraspinals L3-5,B QL, glut med   Gluteus Maximus Response Twitch response elicited              PT Education - 12/08/15 1533    Education provided Yes   Education Details TPDN education sheet   Person(s) Educated Patient   Methods Explanation;Handout   Comprehension Verbalized understanding             PT Long Term Goals - 11/20/15 1156    PT LONG TERM GOAL #1   Title I with HEP   Time 6   Period Weeks   Status New   PT LONG TERM GOAL #2   Title Decreased pain in low back by 50% with ADLS.   Time 6   Period Weeks   Status New   PT LONG TERM GOAL #3   Title Improved sitting and walking tolerance to 30 minutes.               Plan - 12/08/15 1537    Clinical Impression Statement Patient tolerated TPDN with no adverse reactions. She reports decreased pain to 8/10  after DN and manual therapy. No goals met as only second visit.   Rehab Potential Fair   PT Frequency 2x / week   PT Duration 6 weeks   PT Treatment/Interventions Electrical Stimulation;Moist Heat;Therapeutic exercise;Ultrasound;Neuromuscular re-education;Patient/family education;Manual techniques;Dry needling   PT Next Visit Plan Assess DN response. US/manual to QL/gluts prn;  continue STW. Core and LE strenghening. RTD week of 5/1.   PT Home Exercise Plan heat to needled areas      Patient will benefit from skilled therapeutic intervention in order to improve the following deficits and impairments:  Decreased range of motion, Pain, Postural dysfunction, Decreased strength, Decreased activity tolerance  Visit Diagnosis: Left-sided low back pain without sciatica     Problem List Patient Active Problem List   Diagnosis Date Noted  . Vaginal laceration 10/25/2015  . Acute sinusitis with symptoms > 10 days 08/04/2015  . Epigastric mass 04/16/2015  . Abdominal swelling 04/16/2015  . Mucosal abnormality of stomach   . Nausea without vomiting 02/17/2015  . Loss of weight 02/17/2015  . Chronic folliculitis 16/60/6004  . 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 Insomnia 03/12/2014  . Folliculitis 59/97/7414  . Cirrhosis of liver ([place]) 11/15/2013  . Lower extremity edema 11/15/2013  . Abdominal pain, chronic, epigastric 07/10/2013  . Anorexia nervosa 07/10/2013  . 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 Muscle weakness (generalized) 12/28/2011  . CARPAL TUNNEL SYNDROME 10/16/2008  . ABDOMINAL BLOATING 10/16/2008  . Alcohol abuse 04/25/2008  . TOBACCO ABUSE 04/25/2008  . POST TRAUMATIC STRESS SYNDROME 04/22/2008  . DEPRESSION 04/22/2008  . INTRACRANIAL HEMORRHAGE 04/22/2008  . Esophageal reflux 04/22/2008  . INTUSSUSCEPTION 04/22/2008  . ABDOMINAL ADHESIONS 04/22/2008  . Hepatic  cirrhosis due to chronic hepatitis C infection ([place]) 04/22/2008  . OBSTRUCTION OF BILE DUCT 04/22/2008  . LOW BACK PAIN, CHRONIC sees [facility]. 04/22/2008  . Abdominal pain 04/22/2008  . ABDOMINAL PAIN OTHER SPECIFIED SITE 04/22/2008  . HEPATITIS C, HX OF 04/22/2008    Madelyn Flavors PT  12/08/2015, 3:42 PM  [facility] [address] [place], [place], [address] Phone: [phone]   Fax:  [phone]  Name: Sabrina Wyatt MRN: [id number] Date of Birth: August 12, 1951

## 2015-12-08 NOTE — Patient Instructions (Signed)
Trigger Point Dry Needling  . What is Trigger Point Dry Needling (DN)? o DN is a physical therapy technique used to treat muscle pain and dysfunction. Specifically, DN helps deactivate muscle trigger points (muscle knots).  o A thin filiform needle is used to penetrate the skin and stimulate the underlying trigger point. The goal is for a local twitch response (LTR) to occur and for the trigger point to relax. No medication of any kind is injected during the procedure.   . What Does Trigger Point Dry Needling Feel Like?  o The procedure feels different for each individual patient. Some patients report that they do not actually feel the needle enter the skin and overall the process is not painful. Very mild bleeding may occur. However, many patients feel a deep cramping in the muscle in which the needle was inserted. This is the local twitch response.   Marland Kitchen How Will I feel after the treatment? o Soreness is normal, and the onset of soreness may not occur for a few hours. Typically this soreness does not last longer than two days.  o Bruising is uncommon, however; ice can be used to decrease any possible bruising.  o In rare cases feeling tired or nauseous after the treatment is normal. In addition, your symptoms may get worse before they get better, this period will typically not last longer than 24 hours.   . What Can I do After My Treatment? o Increase your hydration by drinking more water for the next 24 hours. o You may place ice or heat on the areas treated that have become sore, however, do not use heat on inflamed or bruised areas. Heat often brings more relief post needling. o You can continue your regular activities, but vigorous activity is not recommended initially after the treatment for 24 hours. o DN is best combined with other physical therapy such as strengthening, stretching, and other therapies.   Madelyn Flavors, PT [facility] [place], [place], [address] Phone: [phone]   Fax:  [phone]

## 2015-12-09 ENCOUNTER — Ambulatory Visit: Payer: Medicare Other | Admitting: Physical Therapy

## 2015-12-09 DIAGNOSIS — M545 Low back pain, unspecified: Secondary | ICD-10-CM

## 2015-12-09 NOTE — Therapy (Signed)
[facility] [place], [place], [address] Phone: [phone]   Fax:  [phone]  Physical Therapy Treatment  Patient Details  Name: Sabrina Wyatt MRN: [id number] Date of Birth: 1951-07-01 Referring Provider: Dr. [name] Fava  Encounter Date: 12/09/2015      PT End of Session - 12/09/15 1434    Visit Number 3   Number of Visits 8   Date for PT Re-Evaluation 01/01/16   Authorization Type UHC Medicare - GCodes required   PT Start Time [id number]   PT Stop Time 1516   PT Time Calculation (min) 42 min   Activity Tolerance Patient tolerated treatment well   Behavior During Therapy [facility] for tasks assessed/performed      Past Medical History  Diagnosis Date  . GERD (gastroesophageal reflux disease)   . Cirrhosis, hepatitis C     CT on 04/21/15= no HCC, pt has been vaccinated for Hep A and Hep B. s/p treatment of HCV with eradication  . Depression   . Carpal tunnel syndrome   . S/P endoscopy 08/27/10    antral erosions, otherwise normal, due egd 08/2012 to screen for varices  . Abdominal wall pain     chronic  . Pulmonary nodules   . PTSD (post-traumatic stress disorder)   . Chronic low back pain   . Intracranial hemorrhage ([place]) 1998    left thalmic  . Polysubstance abuse     HX of  . S/P colonoscopy 2005    Dr Moss Mc polyp removed, otherwise normal  . Hypertension   . Tobacco abuse   . Intussusception ([place]) 04/2012  . Shortness of breath   . Chronic abdominal pain   . Biliary stone   . Seizures ([place]) 1998    with brain bleed x1. None since then and on no meds  . Chronic flank pain     Past Surgical History  Procedure Laterality Date  . Cholecystectomy  2002  . Percutaneous transhepatic cholangiograhpy and billiary drainage  2002  . Roux-en-y-hepatojejunostomy  2003    revision in 2013 for intussusception [facility] Dr. Bailey Mech)  . Spinal fusion      C5-C7  . Carpal tunnel release Bilateral 12/2010  .  Esophagogastroduodenoscopy    09/10/2003    Small hiatal hernia; otherwise normal stomach, normal D1 and D2  . Colonoscopy    09/10/2003    diminutive polyp in the rectum cold biopsied/removed/ Normal colon  . Esophagogastroduodenoscopy  08/27/2010    [id number] esophagus.  No varices.  Couple of tiny antral erosions of doubtful clinical significance, otherwise normal stomach, D1 and D2.  . Esophagogastroduodenoscopy (egd) with propofol N/A 08/02/2013    RMR: Portal gastropathy. No explanation for abdominal pain which I believe is more abdominal wall in origin. She has been seen by Dr. Carlis Abbott  over at [facility]. She is up-to-date on cross sectional imaging. She has a pain management physician in [place]. Repeat EGD for varices in 3 years.  . Colonoscopy with propofol N/A 08/02/2013    [id number] diverticulosis. next TCS 10 years.  . Biliary stone removal  2015  . Lumbar fusion    . Nose surgery  1970  . Esophagogastroduodenoscopy (egd) with propofol N/A 03/06/2015    [id number] gastric mucosac s/p bx  . Biopsy N/A 03/06/2015    Procedure: BIOPSY;  Surgeon: Daneil Dolin, MD;  Location: [facility];  Service: Endoscopy;  Laterality: N/A;  . Repair vaginal cuff N/A 10/25/2015    Procedure: REPAIR VAGINAL LACERATION ;  Surgeon: Jonnie Kind, MD;  Location: [facility];  Service: Gynecology;  Laterality: N/A;    There were no vitals filed for this visit.      Subjective Assessment - 12/09/15 1435    Subjective Patient states she is much better. She doesn't have any pain across low back at L4 and above, but still has pain below L4 mainly on left.   Pertinent History  Per medical chart: DDD, Hepatitis C, HTN, cirrhosis, PTSD, depression, seizures (last was in 1998).   Per pt, R thalamic CVA (1998). Lumbar fusion L4/5.   Limitations Sitting;Standing   How long can you sit comfortably? 15 min   How long can you stand comfortably? 20-25 min   How long can you walk comfortably? 20-25 min   Patient  Stated Goals decrease pain, improve movement   Currently in Pain? Yes   Pain Score 8    Pain Location Back   Pain Orientation Left;Lower                         [facility] Adult PT Treatment/Exercise - 12/09/15 0001    Modalities   Modalities Moist Heat   Moist Heat Therapy   Number Minutes Moist Heat 20 Minutes   Moist Heat Location To abdomen   Manual Therapy   Manual Therapy Soft tissue mobilization   Soft tissue mobilization lumbar paraspinals and gluteals   Myofascial Release lumbar/gluts          Trigger Point Dry Needling - 12/09/15 1516    Consent Given? Yes   Education Handout Provided No   Muscles Treated Lower Body Gluteus maximus  B multifidus   Gluteus Maximus Response Twitch response elicited              PT Education - 12/08/15 1533    Education provided Yes   Education Details TPDN education sheet   Person(s) Educated Patient   Methods Explanation;Handout   Comprehension Verbalized understanding             PT Long Term Goals - 11/20/15 1156    PT LONG TERM GOAL #1   Title I with HEP   Time 6   Period Weeks   Status New   PT LONG TERM GOAL #2   Title Decreased pain in low back by 50% with ADLS.   Time 6   Period Weeks   Status New   PT LONG TERM GOAL #3   Title Improved sitting and walking tolerance to 30 minutes.               Plan - 12/09/15 1520    Clinical Impression Statement Patient tolerated previous DN well with reports of pain decrease. DN repeated today in lumbar MF and gluteals with twitch responses noted.    PT Next Visit Plan Assess DN response. US/manual to QL/gluts prn; continue STW. Core and LE strenghening. RTD week of 5/1.      Patient will benefit from skilled therapeutic intervention in order to improve the following deficits and impairments:     Visit Diagnosis: Left-sided low back pain without sciatica     Problem List Patient Active Problem List   Diagnosis Date Noted  .  Vaginal laceration 10/25/2015  . Acute sinusitis with symptoms > 10 days 08/04/2015  . Epigastric mass 04/16/2015  . Abdominal swelling 04/16/2015  . Mucosal abnormality of stomach   . Nausea without vomiting 02/17/2015  . Loss of weight 02/17/2015  .  Chronic folliculitis [id number]  . 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 Insomnia 03/12/2014  . Folliculitis [id number]  . Cirrhosis of liver ([place]) 11/15/2013  . Lower extremity edema 11/15/2013  . Abdominal pain, chronic, epigastric 07/10/2013  . Anorexia nervosa 07/10/2013  . 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 Muscle weakness (generalized) 12/28/2011  . CARPAL TUNNEL SYNDROME 10/16/2008  . ABDOMINAL BLOATING 10/16/2008  . Alcohol abuse 04/25/2008  . TOBACCO ABUSE 04/25/2008  . POST TRAUMATIC STRESS SYNDROME 04/22/2008  . DEPRESSION 04/22/2008  . INTRACRANIAL HEMORRHAGE 04/22/2008  . Esophageal reflux 04/22/2008  . INTUSSUSCEPTION 04/22/2008  . ABDOMINAL ADHESIONS 04/22/2008  . Hepatic cirrhosis due to chronic hepatitis C infection ([place]) 04/22/2008  . OBSTRUCTION OF BILE DUCT 04/22/2008  . LOW BACK PAIN, CHRONIC sees [facility]. 04/22/2008  . Abdominal pain 04/22/2008  . ABDOMINAL PAIN OTHER SPECIFIED SITE 04/22/2008  . HEPATITIS C, HX OF 04/22/2008   Madelyn Flavors PT  12/09/2015, 3:23 PM  [facility] [address] [place], [place], [address] Phone: [phone]   Fax:  [phone]  Name: Sabrina Wyatt MRN: [id number] Date of Birth: [birth year]

## 2015-12-09 NOTE — Telephone Encounter (Signed)
[id number] x 1 

## 2015-12-15 ENCOUNTER — Other Ambulatory Visit: Payer: Self-pay | Admitting: Obstetrics and Gynecology

## 2015-12-16 ENCOUNTER — Ambulatory Visit: Payer: Medicare Other | Admitting: Physical Therapy

## 2015-12-16 ENCOUNTER — Encounter: Payer: Self-pay | Admitting: Physical Therapy

## 2015-12-16 DIAGNOSIS — M545 Low back pain, unspecified: Secondary | ICD-10-CM

## 2015-12-16 NOTE — Telephone Encounter (Signed)
refil x 4 estrace vC

## 2015-12-16 NOTE — Therapy (Signed)
[facility] [place], [place], [address] Phone: [phone]   Fax:  [phone]  Physical Therapy Treatment  Patient Details  Name: Sabrina Wyatt MRN: [id number] Date of Birth: May 11, 1951 Referring Provider: Dr. [name] Fava  Encounter Date: 12/16/2015      PT End of Session - 12/16/15 1034    Visit Number 4   Number of Visits 8   Date for PT Re-Evaluation 01/01/16   Authorization Type UHC Medicare - GCodes required   PT Start Time 1034   PT Stop Time 1112   PT Time Calculation (min) 38 min      Past Medical History  Diagnosis Date  . GERD (gastroesophageal reflux disease)   . Cirrhosis, hepatitis C     CT on 04/21/15= no HCC, pt has been vaccinated for Hep A and Hep B. s/p treatment of HCV with eradication  . Depression   . Carpal tunnel syndrome   . S/P endoscopy 08/27/10    antral erosions, otherwise normal, due egd 08/2012 to screen for varices  . Abdominal wall pain     chronic  . Pulmonary nodules   . PTSD (post-traumatic stress disorder)   . Chronic low back pain   . 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 Polysubstance abuse     HX of  . S/P colonoscopy 2005    Dr Moss Mc polyp removed, otherwise normal  . Hypertension   . Tobacco abuse   . Intussusception ([place]) 04/2012  . Shortness of breath   . Chronic abdominal pain   . Biliary stone   . Seizures ([place]) 1998    with brain bleed x1. None since then and on no meds  . Chronic flank pain     Past Surgical History  Procedure Laterality Date  . Cholecystectomy  2002  . Percutaneous transhepatic cholangiograhpy and billiary drainage  2002  . Roux-en-y-hepatojejunostomy  2003    revision in 2013 for intussusception [facility] Dr. Bailey Mech)  . Spinal fusion      C5-C7  . Carpal tunnel release Bilateral 12/2010  . Esophagogastroduodenoscopy    09/10/2003    Small hiatal hernia; otherwise normal stomach, normal D1 and D2  . Colonoscopy     09/10/2003    diminutive polyp in the rectum cold biopsied/removed/ Normal colon  . Esophagogastroduodenoscopy  08/27/2010    [id number] esophagus.  No varices.  Couple of tiny antral erosions of doubtful clinical significance, otherwise normal stomach, D1 and D2.  . Esophagogastroduodenoscopy (egd) with propofol N/A 08/02/2013    RMR: Portal gastropathy. No explanation for abdominal pain which I believe is more abdominal wall in origin. She has been seen by Dr. Carlis Abbott  over at [facility]. She is up-to-date on cross sectional imaging. She has a pain management physician in [place]. Repeat EGD for varices in 3 years.  . Colonoscopy with propofol N/A 08/02/2013    [id number] diverticulosis. next TCS 10 years.  . Biliary stone removal  2015  . Lumbar fusion    . Nose surgery  1970  . Esophagogastroduodenoscopy (egd) with propofol N/A 03/06/2015    [id number] gastric mucosac s/p bx  . Biopsy N/A 03/06/2015    Procedure: BIOPSY;  Surgeon: Daneil Dolin, MD;  Location: [facility];  Service: Endoscopy;  Laterality: N/A;  . Repair vaginal cuff N/A 10/25/2015    Procedure: REPAIR VAGINAL LACERATION ;  Surgeon: Jonnie Kind, MD;  Location: [facility];  Service: Gynecology;  Laterality: N/A;  There were no vitals filed for this visit.      Subjective Assessment - 12/16/15 1037    Subjective Everything is fine the day of treatment, I'm relaxed and I do my exercises.    Pertinent History  Per medical chart: DDD, Hepatitis C, HTN, cirrhosis, PTSD, depression, seizures (last was in 1998).   Per pt, R thalamic CVA (1998). Lumbar fusion L4/5.   Limitations Sitting;Standing   How long can you sit comfortably? 15 min   How long can you stand comfortably? 20-25 min   How long can you walk comfortably? 20-25 min   Patient Stated Goals decrease pain, improve movement   Currently in Pain? Yes   Pain Score 8    Pain Location Back   Pain Orientation Left;Right   Pain Descriptors / Indicators Stabbing   Pain  Type Chronic pain   Pain Onset More than a month ago   Pain Frequency Constant   Aggravating Factors  too much standing and walking   Pain Relieving Factors meds, massage, heat/cold, full flexion            [facility] PT Assessment - 12/16/15 0001    Posture/Postural Control   Posture Comments 84 cm leg length B                     [facility] Adult PT Treatment/Exercise - 12/16/15 0001    Modalities   Modalities Ultrasound;Moist Heat   Moist Heat Therapy   Number Minutes Moist Heat 20 Minutes   Moist Heat Location Other (comment)  abdomen   Ultrasound   Ultrasound Location B Lumbar paraspinals   Ultrasound Parameters 1.5 W/cm2 1 mhz cont x 10 min   Ultrasound Goals Pain   Manual Therapy   Manual Therapy Soft tissue mobilization   Soft tissue mobilization to L lumbar paraspinals and QL                     PT Long Term Goals - 11/20/15 1156    PT LONG TERM GOAL #1   Title I with HEP   Time 6   Period Weeks   Status New   PT LONG TERM GOAL #2   Title Decreased pain in low back by 50% with ADLS.   Time 6   Period Weeks   Status New   PT LONG TERM GOAL #3   Title Improved sitting and walking tolerance to 30 minutes.               Plan - 12/16/15 1110    Clinical Impression Statement Patient presents today with back pain rated at 8/10 on left side. She responded well to [place] and manual therapy reporting a decrease to  6/10 after treatment. Patient expressed concern that she had a leg length difference, however measurements prove even leg length.   Rehab Potential Fair   PT Frequency 2x / week   PT Duration 6 weeks   PT Treatment/Interventions Electrical Stimulation;Moist Heat;Therapeutic exercise;Ultrasound;Neuromuscular re-education;Patient/family education;Manual techniques;Dry needling   PT Next Visit Plan Continue STW. MD note for 12/22/15. Add strengthening.   Consulted and Agree with Plan of Care Patient      Patient will benefit from  skilled therapeutic intervention in order to improve the following deficits and impairments:  Decreased range of motion, Pain, Postural dysfunction, Decreased strength, Decreased activity tolerance  Visit Diagnosis: Left-sided low back pain without sciatica     Problem List Patient Active Problem List   Diagnosis  Date Noted  . Vaginal laceration 10/25/2015  . Acute sinusitis with symptoms > 10 days 08/04/2015  . Epigastric mass 04/16/2015  . Abdominal swelling 04/16/2015  . Mucosal abnormality of stomach   . Nausea without vomiting 02/17/2015  . Loss of weight 02/17/2015  . Chronic folliculitis [id number]  . 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 Insomnia 03/12/2014  . Folliculitis [id number]  . Cirrhosis of liver ([place]) 11/15/2013  . Lower extremity edema 11/15/2013  . Abdominal pain, chronic, epigastric 07/10/2013  . Anorexia nervosa 07/10/2013  . 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 Muscle weakness (generalized) 12/28/2011  . CARPAL TUNNEL SYNDROME 10/16/2008  . ABDOMINAL BLOATING 10/16/2008  . Alcohol abuse 04/25/2008  . TOBACCO ABUSE 04/25/2008  . POST TRAUMATIC STRESS SYNDROME 04/22/2008  . DEPRESSION 04/22/2008  . INTRACRANIAL HEMORRHAGE 04/22/2008  . Esophageal reflux 04/22/2008  . INTUSSUSCEPTION 04/22/2008  . ABDOMINAL ADHESIONS 04/22/2008  . Hepatic cirrhosis due to chronic hepatitis C infection ([place]) 04/22/2008  . OBSTRUCTION OF BILE DUCT 04/22/2008  . LOW BACK PAIN, CHRONIC sees [facility] [facility]. 04/22/2008  . Abdominal pain 04/22/2008  . ABDOMINAL PAIN OTHER SPECIFIED SITE 04/22/2008  . HEPATITIS C, HX OF 04/22/2008    Madelyn Flavors PT 12/16/2015, 9:38 PM  [facility] [address] [place], [place], [address] Phone: [phone]   Fax:  [phone]  Name: Sabrina Wyatt MRN: [id number] Date of Birth:  06/09/1951

## 2015-12-18 ENCOUNTER — Ambulatory Visit: Payer: Medicare Other | Admitting: Physical Therapy

## 2015-12-18 DIAGNOSIS — M545 Low back pain, unspecified: Secondary | ICD-10-CM

## 2015-12-18 NOTE — Therapy (Signed)
[facility] [place], [place], [address] Phone: [phone]   Fax:  [phone]  Physical Therapy Treatment  Patient Details  Name: Sabrina Wyatt MRN: [id number] Date of Birth: October 03, 1950 Referring Provider: Dr. [name] Fava  Encounter Date: 12/18/2015      PT End of Session - 12/18/15 1344    Visit Number 5   Number of Visits 8   Date for PT Re-Evaluation 01/01/16   Authorization Type UHC Medicare - GCodes required   PT Start Time 1344   PT Stop Time 1426   PT Time Calculation (min) 42 min   Activity Tolerance Patient tolerated treatment well;Other (comment)  reported SOB after 10 reps in prone   Behavior During Therapy [facility] for tasks assessed/performed      Past Medical History  Diagnosis Date  . GERD (gastroesophageal reflux disease)   . Cirrhosis, hepatitis C     CT on 04/21/15= no HCC, pt has been vaccinated for Hep A and Hep B. s/p treatment of HCV with eradication  . Depression   . Carpal tunnel syndrome   . S/P endoscopy 08/27/10    antral erosions, otherwise normal, due egd 08/2012 to screen for varices  . Abdominal wall pain     chronic  . Pulmonary nodules   . PTSD (post-traumatic stress disorder)   . Chronic low back pain   . 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 Polysubstance abuse     HX of  . S/P colonoscopy 2005    Dr Moss Mc polyp removed, otherwise normal  . Hypertension   . Tobacco abuse   . Intussusception ([place]) 04/2012  . Shortness of breath   . Chronic abdominal pain   . Biliary stone   . Seizures ([place]) 1998    with brain bleed x1. None since then and on no meds  . Chronic flank pain     Past Surgical History  Procedure Laterality Date  . Cholecystectomy  2002  . Percutaneous transhepatic cholangiograhpy and billiary drainage  2002  . Roux-en-y-hepatojejunostomy  2003    revision in 2013 for intussusception [facility] Dr. Bailey Mech)  . Spinal fusion      C5-C7  .  Carpal tunnel release Bilateral 12/2010  . Esophagogastroduodenoscopy    09/10/2003    Small hiatal hernia; otherwise normal stomach, normal D1 and D2  . Colonoscopy    09/10/2003    diminutive polyp in the rectum cold biopsied/removed/ Normal colon  . Esophagogastroduodenoscopy  08/27/2010    [id number] esophagus.  No varices.  Couple of tiny antral erosions of doubtful clinical significance, otherwise normal stomach, D1 and D2.  . Esophagogastroduodenoscopy (egd) with propofol N/A 08/02/2013    RMR: Portal gastropathy. No explanation for abdominal pain which I believe is more abdominal wall in origin. She has been seen by Dr. Carlis Abbott  over at [facility]. She is up-to-date on cross sectional imaging. She has a pain management physician in [place]. Repeat EGD for varices in 3 years.  . Colonoscopy with propofol N/A 08/02/2013    [id number] diverticulosis. next TCS 10 years.  . Biliary stone removal  2015  . Lumbar fusion    . Nose surgery  1970  . Esophagogastroduodenoscopy (egd) with propofol N/A 03/06/2015    [id number] gastric mucosac s/p bx  . Biopsy N/A 03/06/2015    Procedure: BIOPSY;  Surgeon: Daneil Dolin, MD;  Location: [facility];  Service: Endoscopy;  Laterality: N/A;  . Repair vaginal cuff N/A  10/25/2015    Procedure: REPAIR VAGINAL LACERATION ;  Surgeon: Jonnie Kind, MD;  Location: [facility];  Service: Gynecology;  Laterality: N/A;    There were no vitals filed for this visit.      Subjective Assessment - 12/18/15 1344    Subjective Patient states she twisted this morning and her thoracic spine is spasmed. She also states that her 2 and 3 toes began tingling 2 days ago at night and her toes are red. She thought it was new med so stopped taiing it.   Pertinent History  Per medical chart: DDD, Hepatitis C, HTN, cirrhosis, PTSD, depression, seizures (last was in 1998).   Per pt, R thalamic CVA (1998). Lumbar fusion L4/5.   How long can you sit comfortably? one hour   How long can  you stand comfortably? 20-25 min   How long can you walk comfortably? 1-2 hours (packing)   Patient Stated Goals decrease pain, improve movement   Currently in Pain? Yes   Pain Score 9    Pain Location Back   Pain Orientation Left;Lower;Mid   Pain Descriptors / Indicators Stabbing   Pain Type Chronic pain   Pain Onset More than a month ago   Pain Frequency Constant   Aggravating Factors  standing   Pain Relieving Factors meds, massage, full flexion   Effect of Pain on Daily Activities limited                         [facility] Adult PT Treatment/Exercise - 12/18/15 0001    Exercises   Exercises Lumbar   Lumbar Exercises: Prone   Straight Leg Raise 10 reps  then patient reports SOB   Modalities   Modalities Moist Heat   Moist Heat Therapy   Number Minutes Moist Heat 20 Minutes   Moist Heat Location Other (comment)  abdomen   Manual Therapy   Manual Therapy Soft tissue mobilization;Myofascial release   Soft tissue mobilization to B paraspinals, QL and L lats   Myofascial Release gluteals along iliac crest                     PT Long Term Goals - 12/18/15 1357    PT LONG TERM GOAL #1   Title I with HEP   Time 6   Period Weeks   Status On-going   PT LONG TERM GOAL #2   Title Decreased pain in low back by 50% with ADLS.   Baseline 10-15 % (but only 1-2 days after treatment)   Time 6   Period Weeks   Status On-going   PT LONG TERM GOAL #3   Title Improved sitting and walking tolerance to 30 minutes.   Time 4   Period Weeks   Status Achieved               Plan - 12/18/15 1424    Clinical Impression Statement Patient presented today with new c/o strain in the left latissimus. She responded well to STW and reported decreased pain following treatment to 6/10. She continues to c/o pain in PSIS region B L > R. Patient gets relief with manual therapy but it is temporary. She has made significant improvements in sitting and walking tolerances  since eval.  Minimal strengthening today produced SOB and patient stopped.   Rehab Potential Fair   PT Frequency 2x / week   PT Duration 6 weeks   PT Treatment/Interventions Electrical Stimulation;Moist Heat;Therapeutic exercise;Ultrasound;Neuromuscular re-education;Patient/family  education;Manual techniques;Dry needling   PT Next Visit Plan await MD orders. continue manual and add strengthening as tolerated.   Consulted and Agree with Plan of Care Patient      Patient will benefit from skilled therapeutic intervention in order to improve the following deficits and impairments:  Decreased range of motion, Pain, Postural dysfunction, Decreased strength, Decreased activity tolerance  Visit Diagnosis: Left-sided low back pain without sciatica     Problem List Patient Active Problem List   Diagnosis Date Noted  . Vaginal laceration 10/25/2015  . Acute sinusitis with symptoms > 10 days 08/04/2015  . Epigastric mass 04/16/2015  . Abdominal swelling 04/16/2015  . Mucosal abnormality of stomach   . Nausea without vomiting 02/17/2015  . Loss of weight 02/17/2015  . Chronic folliculitis [id number]  . 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 Insomnia 03/12/2014  . Folliculitis [id number]  . Cirrhosis of liver ([place]) 11/15/2013  . Lower extremity edema 11/15/2013  . Abdominal pain, chronic, epigastric 07/10/2013  . Anorexia nervosa 07/10/2013  . 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 Muscle weakness (generalized) 12/28/2011  . CARPAL TUNNEL SYNDROME 10/16/2008  . ABDOMINAL BLOATING 10/16/2008  . Alcohol abuse 04/25/2008  . TOBACCO ABUSE 04/25/2008  . POST TRAUMATIC STRESS SYNDROME 04/22/2008  . DEPRESSION 04/22/2008  . INTRACRANIAL HEMORRHAGE 04/22/2008  . Esophageal reflux 04/22/2008  . INTUSSUSCEPTION 04/22/2008  . ABDOMINAL ADHESIONS 04/22/2008  . Hepatic cirrhosis due to  chronic hepatitis C infection ([place]) 04/22/2008  . OBSTRUCTION OF BILE DUCT 04/22/2008  . LOW BACK PAIN, CHRONIC sees [facility] [facility]. 04/22/2008  . Abdominal pain 04/22/2008  . ABDOMINAL PAIN OTHER SPECIFIED SITE 04/22/2008  . HEPATITIS C, HX OF 04/22/2008    Madelyn Flavors PT  12/18/2015, 3:54 PM  [facility] [place], [place], [address] Phone: [phone]   Fax:  [phone]  Name: Sabrina Wyatt MRN: [id number] Date of Birth: [birth year]

## 2015-12-18 NOTE — Telephone Encounter (Signed)
Patient called to schedule lung cancer screening appointment. Patient states she is open to come in any day after 12/30/15. She can be reached at [phone]. - Thanks -prm

## 2015-12-19 ENCOUNTER — Encounter: Payer: Self-pay | Admitting: Obstetrics and Gynecology

## 2015-12-19 ENCOUNTER — Ambulatory Visit (INDEPENDENT_AMBULATORY_CARE_PROVIDER_SITE_OTHER): Payer: Medicare Other | Admitting: Obstetrics and Gynecology

## 2015-12-19 VITALS — BP 130/76 | Ht 65.0 in | Wt 151.5 lb

## 2015-12-19 DIAGNOSIS — Z113 Encounter for screening for infections with a predominantly sexual mode of transmission: Secondary | ICD-10-CM | POA: Insufficient documentation

## 2015-12-19 DIAGNOSIS — Z1159 Encounter for screening for other viral diseases: Secondary | ICD-10-CM | POA: Diagnosis not present

## 2015-12-19 DIAGNOSIS — Z202 Contact with and (suspected) exposure to infections with a predominantly sexual mode of transmission: Secondary | ICD-10-CM | POA: Diagnosis not present

## 2015-12-19 NOTE — Progress Notes (Signed)
Patient ID: Sabrina Wyatt, female   DOB: Oct 16, 1950, 65 y.o.   MRN: [id number] Pt here today for follow up, pt wants to discuss getting blood work.

## 2015-12-19 NOTE — Addendum Note (Signed)
Addended by: Farley Ly on: 12/19/2015 12:08 PM   Modules accepted: Orders

## 2015-12-19 NOTE — Patient Instructions (Signed)
PLEASE SIGN UP FOR MYCHART SO I CAN FORWARD RESULTS OF TESTS TO YOU

## 2015-12-19 NOTE — Progress Notes (Signed)
Patient ID: Sabrina Wyatt, female   DOB: Aug 22, 1951, 65 y.o.   MRN: [id number]   [facility] Visit  @DATE @            Patient name: Sabrina Wyatt MRN [id number]  Date of birth: Jul 28, 1951  CC & HPI:  Sabrina Wyatt is a 65 y.o. female, with PMHx noted below including vaginal laceration repair completed on 10/25/15 and hepatitis C (s/p treatment of HCV with eradication), presenting today for STD Testing due to unprotected sexual contact. Pt also complains of persistent painful sebaceous cyst on her gluteus maximus originally onset 6 months ago.   ROS:  Review of Systems  Skin:       + sebaceous cyst on her gluteus maximus  All other systems reviewed and are negative.  Pertinent History Reviewed:   Reviewed: Significant for HTN, cholecystectomy, repair vaginal cuff Medical         Past Medical History  Diagnosis Date  . GERD (gastroesophageal reflux disease)   . Cirrhosis, hepatitis C     CT on 04/21/15= no HCC, pt has been vaccinated for Hep A and Hep B. s/p treatment of HCV with eradication  . Depression   . Carpal tunnel syndrome   . S/P endoscopy 08/27/10    antral erosions, otherwise normal, due egd 08/2012 to screen for varices  . Abdominal wall pain     chronic  . Pulmonary nodules   . PTSD (post-traumatic stress disorder)   . Chronic low back pain   . 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 Polysubstance abuse     HX of  . S/P colonoscopy 2005    Dr Moss Mc polyp removed, otherwise normal  . Hypertension   . Tobacco abuse   . Intussusception ([place]) 04/2012  . Shortness of breath   . Chronic abdominal pain   . Biliary stone   . Seizures ([place]) 1998    with brain bleed x1. None since then and on no meds  . Chronic flank pain                               Surgical Hx:    Past Surgical History  Procedure Laterality Date  . Cholecystectomy  2002  . Percutaneous transhepatic cholangiograhpy and billiary drainage  2002  .  Roux-en-y-hepatojejunostomy  2003    revision in 2013 for intussusception [facility] Dr. Bailey Mech)  . Spinal fusion      C5-C7  . Carpal tunnel release Bilateral 12/2010  . Esophagogastroduodenoscopy    09/10/2003    Small hiatal hernia; otherwise normal stomach, normal D1 and D2  . Colonoscopy    09/10/2003    diminutive polyp in the rectum cold biopsied/removed/ Normal colon  . Esophagogastroduodenoscopy  08/27/2010    [id number] esophagus.  No varices.  Couple of tiny antral erosions of doubtful clinical significance, otherwise normal stomach, D1 and D2.  . Esophagogastroduodenoscopy (egd) with propofol N/A 08/02/2013    RMR: Portal gastropathy. No explanation for abdominal pain which I believe is more abdominal wall in origin. She has been seen by Dr. Carlis Abbott  over at [facility]. She is up-to-date on cross sectional imaging. She has a pain management physician in [place]. Repeat EGD for varices in 3 years.  . Colonoscopy with propofol N/A 08/02/2013    [id number] diverticulosis. next TCS 10 years.  . Biliary stone removal  2015  . Lumbar fusion    .  Nose surgery  1970  . Esophagogastroduodenoscopy (egd) with propofol N/A 03/06/2015    [id number] gastric mucosac s/p bx  . Biopsy N/A 03/06/2015    Procedure: BIOPSY;  Surgeon: Daneil Dolin, MD;  Location: [facility];  Service: Endoscopy;  Laterality: N/A;  . Repair vaginal cuff N/A 10/25/2015    Procedure: REPAIR VAGINAL LACERATION ;  Surgeon: Jonnie Kind, MD;  Location: [facility];  Service: Gynecology;  Laterality: N/A;   Medications: Reviewed & Updated - see associated section                       Current outpatient prescriptions:  .  acyclovir (ZOVIRAX) 400 MG tablet, TAKE AS DIRECTED. (Patient taking differently: TAKE ONE TABLET BY MOUTH TWICE DAILY), Disp: 40 tablet, Rfl: 11 .  albuterol (PROVENTIL) (2.5 MG/3ML) 0.083% nebulizer solution, Take 2.5 mg by nebulization every 6 (six) hours as needed for wheezing or shortness of breath. ,  Disp: , Rfl:  .  ALPRAZolam (XANAX) 0.5 MG tablet, Take 0.5 mg by mouth at bedtime., Disp: , Rfl:  .  atenolol (TENORMIN) 25 MG tablet, Take 25 mg by mouth every morning. , Disp: , Rfl:  .  Cholecalciferol (VITAMIN D-3) 1000 UNITS CAPS, Take 1,000 capsules by mouth daily., Disp: , Rfl:  .  DULoxetine (CYMBALTA) 60 MG capsule, Take 60 mg by mouth 2 (two) times daily. , Disp: , Rfl:  .  EPIPEN 2-PAK 0.3 MG/0.3ML SOAJ injection, Inject 0.3 mg into the skin as needed (allergic reactions). , Disp: , Rfl:  .  ESTRACE VAGINAL 0.1 MG/GM vaginal cream, PLACE 1GM VAGINALLY AT BEDTIME., Disp: 42.5 g, Rfl: 4 .  etodolac (LODINE) 400 MG tablet, Take 400 mg by mouth 2 (two) times daily. , Disp: , Rfl:  .  ferrous sulfate 325 (65 FE) MG tablet, Take 325 mg by mouth daily with breakfast., Disp: , Rfl:  .  fluticasone (FLONASE) 50 MCG/ACT nasal spray, Place 2 sprays into the nose daily.  , Disp: , Rfl:  .  levocetirizine (XYZAL) 5 MG tablet, Take 5 mg by mouth daily., Disp: , Rfl:  .  Omega-3 Fatty Acids (FISH OIL PO), Take 2 capsules by mouth daily. Reported on 09/26/2015, Disp: , Rfl:  .  omeprazole (PRILOSEC) 20 MG capsule, TAKE ONE CAPSULE BY MOUTH EVERY DAY, Disp: 30 capsule, Rfl: 11 .  Oxycodone HCl 10 MG TABS, Take 1 tablet (10 mg total) by mouth 3 (three) times daily as needed. (Patient taking differently: Take 10 mg by mouth 3 (three) times daily as needed (pain). ), Disp: 30 tablet, Rfl: 0 .  PROAIR HFA 108 (90 BASE) MCG/ACT inhaler, Inhale 2 puffs into the lungs every 6 (six) hours as needed for wheezing or shortness of breath. , Disp: , Rfl:  .  pyridOXINE (VITAMIN B-6) 100 MG tablet, Take 100 mg by mouth daily., Disp: , Rfl:  .  tiZANidine (ZANAFLEX) 4 MG tablet, Take 4 mg by mouth 2 (two) times daily. , Disp: , Rfl:  .  traMADol (ULTRAM) 50 MG tablet, Take 1 tablet (50 mg total) by mouth every 6 (six) hours as needed for moderate pain., Disp: 30 tablet, Rfl: 0   Social History: Reviewed -  reports  that she has been smoking Cigarettes.  She has a 7.5 pack-year smoking history. She has never used smokeless tobacco.  Objective Findings:  Vitals: Blood pressure 130/76, height 5\' 5"  (1.651 m), weight 151 lb 8 oz (68.72 kg).  Physical  Examination: General appearance - alert, well appearing, and in no distress Pelvic - normal external genitalia, vulva, vagina, cervix, uterus and adnexa,  VULVA: normal appearing vulva with no masses, tenderness or lesions,  VAGINA: normal appearing vagina with normal color and discharge, no lesions, CERVIX: normal appearing cervix without discharge or lesions,  UTERUS: uterus is normal size, shape, consistency and nontender, ADNEXA: normal adnexa in size, nontender and no masses SKIN: 5x37mm firm sebaceous cyst located on right gluteus maximus,  consisting with chronic hair follicle.   Assessment & Plan:   A:  1. STD contact for screening 2. Sebaceous cyst right buttock   P:  1. Chlamydia, GC, HIV, RPR     By signing my name below, I, Terressa Koyanagi, attest that this documentation has been prepared under the direction and in the presence of Mallory Shirk, MD. Electronically Signed: Terressa Koyanagi, ED Scribe. 12/19/2015. 11:51 AM.   I personally performed the services described in this documentation, which was SCRIBED in my presence. The recorded information has been reviewed and considered accurate. It has been edited as necessary during review. Jonnie Kind, MD

## 2015-12-22 DIAGNOSIS — H5203 Hypermetropia, bilateral: Secondary | ICD-10-CM | POA: Diagnosis not present

## 2015-12-22 DIAGNOSIS — H25813 Combined forms of age-related cataract, bilateral: Secondary | ICD-10-CM | POA: Diagnosis not present

## 2015-12-22 DIAGNOSIS — H25811 Combined forms of age-related cataract, right eye: Secondary | ICD-10-CM | POA: Diagnosis not present

## 2015-12-22 DIAGNOSIS — H01009 Unspecified blepharitis unspecified eye, unspecified eyelid: Secondary | ICD-10-CM | POA: Diagnosis not present

## 2015-12-22 LAB — GC/CHLAMYDIA PROBE AMP
Chlamydia trachomatis, NAA: NEGATIVE
Neisseria gonorrhoeae by PCR: POSITIVE — AB

## 2015-12-23 ENCOUNTER — Encounter: Payer: Self-pay | Admitting: Physical Therapy

## 2015-12-24 ENCOUNTER — Other Ambulatory Visit: Payer: Self-pay | Admitting: Obstetrics and Gynecology

## 2015-12-24 ENCOUNTER — Ambulatory Visit (INDEPENDENT_AMBULATORY_CARE_PROVIDER_SITE_OTHER): Payer: Medicare Other | Admitting: *Deleted

## 2015-12-24 ENCOUNTER — Telehealth: Payer: Self-pay | Admitting: *Deleted

## 2015-12-24 ENCOUNTER — Encounter: Payer: Self-pay | Admitting: *Deleted

## 2015-12-24 DIAGNOSIS — A549 Gonococcal infection, unspecified: Secondary | ICD-10-CM | POA: Diagnosis not present

## 2015-12-24 LAB — HIV ANTIBODY (ROUTINE TESTING W REFLEX)

## 2015-12-24 LAB — RNA QUALITATIVE: HIV 1 RNA Qualitative: NEGATIVE

## 2015-12-24 LAB — HIV 1/2 AB DIFFERENTIATION
HIV 1 Ab: NEGATIVE
HIV 2 Ab: NEGATIVE

## 2015-12-24 LAB — RPR: RPR Ser Ql: NONREACTIVE

## 2015-12-24 MED ORDER — CEFTRIAXONE SODIUM 1 G IJ SOLR
250.0000 mg | Freq: Once | INTRAMUSCULAR | Status: AC
  Administered 2015-12-24: 250 mg via INTRAMUSCULAR

## 2015-12-24 MED ORDER — AZITHROMYCIN 500 MG PO TABS: 1000.0000 mg | ORAL_TABLET | Freq: Once | ORAL | Status: DC

## 2015-12-24 NOTE — Telephone Encounter (Signed)
Called spoke with pt. I scheduled her for her SDMV on 12/31/15. CT order was already placed. I explained to her that she would receive a phone call to schedule her CT . She voiced understanding and had no further questions. Will route to [facility] for scheduling CT.

## 2015-12-24 NOTE — Progress Notes (Signed)
Pt here for Rocephin 250 mg. Pt tolerated shot well. Return in 4 weeks for POC for GC. Pt voiced understanding. Sabrina Wyatt

## 2015-12-24 NOTE — Telephone Encounter (Signed)
Low dose ct scheduled on 12/31/15 after SDMV @11 :15am @[facility]  lmom Joellen [place]

## 2015-12-24 NOTE — Telephone Encounter (Signed)
Pt informed of positive Gonorrhea, Azithromycin 1,000 mg and Rocephin 250 mg injection scheduled for 12/24/2015 for treatment. Pt verbalized understanding.

## 2015-12-25 DIAGNOSIS — G894 Chronic pain syndrome: Secondary | ICD-10-CM | POA: Diagnosis not present

## 2015-12-25 DIAGNOSIS — M79671 Pain in right foot: Secondary | ICD-10-CM | POA: Diagnosis not present

## 2015-12-25 DIAGNOSIS — M545 Low back pain: Secondary | ICD-10-CM | POA: Diagnosis not present

## 2015-12-25 DIAGNOSIS — M542 Cervicalgia: Secondary | ICD-10-CM | POA: Diagnosis not present

## 2015-12-25 DIAGNOSIS — Z79891 Long term (current) use of opiate analgesic: Secondary | ICD-10-CM | POA: Diagnosis not present

## 2015-12-25 DIAGNOSIS — M79672 Pain in left foot: Secondary | ICD-10-CM | POA: Diagnosis not present

## 2015-12-25 NOTE — Telephone Encounter (Signed)
LVM for pt to return call

## 2015-12-26 NOTE — Telephone Encounter (Signed)
lmtcb x3 for pt. 

## 2015-12-29 ENCOUNTER — Telehealth: Payer: Self-pay | Admitting: Obstetrics and Gynecology

## 2015-12-29 MED ORDER — AZITHROMYCIN 500 MG PO TABS: 1000.0000 mg | ORAL_TABLET | Freq: Once | ORAL | Status: DC

## 2015-12-29 NOTE — Telephone Encounter (Signed)
The patient left a message on my VM at 2:25pm I just tried calling her back again and had to leave a message a 2nd time. She stated she was now at home and had her calender in from of her.

## 2015-12-29 NOTE — Telephone Encounter (Signed)
I finally got to speak with Mrs. Sabrina Wyatt and answered any of her questions to the best of my ability. I explained that Eric Form would answer any further questions that she might have during her appt on 12/31/2015

## 2015-12-29 NOTE — Telephone Encounter (Signed)
Pt aware that medication has been resent to her pharmacy. Pt verbalized understanding.

## 2015-12-29 NOTE — Telephone Encounter (Signed)
I tried calling the patient back on her cell # [phone] and had to leave a message for her to call me back on my phone # [phone]. Now waiting for the patient to return call in hopes of answering her questions about her appts.

## 2015-12-29 NOTE — Telephone Encounter (Signed)
Called and advised patient of her CT appointment.  Patient would like more information regarding her appointments. Requesting [facility] call her back.  She said that she has been playing phone tag with [facility]. She said that she can be reached on her cell phone:  [phone]  To [facility]: please contact patient to go over instructions of CT and give directions for location where CT is being done.  Thanks.

## 2015-12-30 ENCOUNTER — Encounter: Payer: Self-pay | Admitting: Physical Therapy

## 2015-12-30 ENCOUNTER — Encounter: Payer: Self-pay | Admitting: Acute Care

## 2015-12-31 ENCOUNTER — Ambulatory Visit (INDEPENDENT_AMBULATORY_CARE_PROVIDER_SITE_OTHER)
Admission: RE | Admit: 2015-12-31 | Discharge: 2015-12-31 | Disposition: A | Discharge status: Home / Self Care | Payer: Medicare Other | Source: Ambulatory Visit | Attending: Acute Care | Admitting: Acute Care

## 2015-12-31 ENCOUNTER — Ambulatory Visit (INDEPENDENT_AMBULATORY_CARE_PROVIDER_SITE_OTHER): Payer: Medicare Other | Admitting: Acute Care

## 2015-12-31 DIAGNOSIS — F17210 Nicotine dependence, cigarettes, uncomplicated: Secondary | ICD-10-CM

## 2015-12-31 DIAGNOSIS — Z87891 Personal history of nicotine dependence: Secondary | ICD-10-CM | POA: Diagnosis not present

## 2016-01-01 ENCOUNTER — Ambulatory Visit: Payer: Medicare Other | Attending: Anesthesiology | Admitting: Physical Therapy

## 2016-01-01 ENCOUNTER — Encounter: Payer: Self-pay | Admitting: Acute Care

## 2016-01-01 DIAGNOSIS — M542 Cervicalgia: Secondary | ICD-10-CM | POA: Diagnosis not present

## 2016-01-01 DIAGNOSIS — M546 Pain in thoracic spine: Secondary | ICD-10-CM | POA: Insufficient documentation

## 2016-01-01 DIAGNOSIS — M545 Low back pain, unspecified: Secondary | ICD-10-CM

## 2016-01-01 NOTE — Therapy (Addendum)
[facility] [place], [place], [address] Phone: [phone]   Fax:  [phone]  Physical Therapy Treatment  Patient Details  Name: Sabrina Wyatt MRN: [id number] Date of Birth: 03/11/1951 Referring Provider: Dr. [name] Fava  Encounter Date: 01/01/2016      PT End of Session - 01/01/16 1036    Visit Number 6   Number of Visits 14   Date for PT Re-Evaluation 01/29/16   Authorization Type [employer] Medicare - GCodes required   PT Start Time 1034   PT Stop Time 1126   PT Time Calculation (min) 52 min   Activity Tolerance Patient tolerated treatment well   Behavior During Therapy [facility] for tasks assessed/performed      Past Medical History  Diagnosis Date  . GERD (gastroesophageal reflux disease)   . Cirrhosis, hepatitis C     CT on 04/21/15= no HCC, pt has been vaccinated for Hep A and Hep B. s/p treatment of HCV with eradication  . Depression   . Carpal tunnel syndrome   . S/P endoscopy 08/27/10    antral erosions, otherwise normal, due egd 08/2012 to screen for varices  . Abdominal wall pain     chronic  . Pulmonary nodules   . PTSD (post-traumatic stress disorder)   . Chronic low back pain   . 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 Polysubstance abuse     HX of  . S/P colonoscopy 2005    Dr Moss Mc polyp removed, otherwise normal  . Hypertension   . Tobacco abuse   . Intussusception ([place]) 04/2012  . Shortness of breath   . Chronic abdominal pain   . Biliary stone   . Seizures ([place]) 1998    with brain bleed x1. None since then and on no meds  . Chronic flank pain   . Gonorrhea     Past Surgical History  Procedure Laterality Date  . Cholecystectomy  2002  . Percutaneous transhepatic cholangiograhpy and billiary drainage  2002  . Roux-en-y-hepatojejunostomy  2003    revision in 2013 for intussusception [facility] Dr. Bailey Mech)  . Spinal fusion      C5-C7  . Carpal tunnel release Bilateral 12/2010   . Esophagogastroduodenoscopy    09/10/2003    Small hiatal hernia; otherwise normal stomach, normal D1 and D2  . Colonoscopy    09/10/2003    diminutive polyp in the rectum cold biopsied/removed/ Normal colon  . Esophagogastroduodenoscopy  08/27/2010    DJS:HFWYOV esophagus.  No varices.  Couple of tiny antral erosions of doubtful clinical significance, otherwise normal stomach, D1 and D2.  . Esophagogastroduodenoscopy (egd) with propofol N/A 08/02/2013    RMR: Portal gastropathy. No explanation for abdominal pain which I believe is more abdominal wall in origin. She has been seen by Dr. Carlis Abbott  over at [facility]. She is up-to-date on cross sectional imaging. She has a pain management physician in [place]. Repeat EGD for varices in 3 years.  . Colonoscopy with propofol N/A 08/02/2013    [id number] diverticulosis. next TCS 10 years.  . Biliary stone removal  2015  . Lumbar fusion    . Nose surgery  1970  . Esophagogastroduodenoscopy (egd) with propofol N/A 03/06/2015    AJO:INOMVEHM gastric mucosac s/p bx  . Biopsy N/A 03/06/2015    Procedure: BIOPSY;  Surgeon: Daneil Dolin, MD;  Location: [facility];  Service: Endoscopy;  Laterality: N/A;  . Repair vaginal cuff N/A 10/25/2015    Procedure:  REPAIR VAGINAL LACERATION ;  Surgeon: Jonnie Kind, MD;  Location: [facility];  Service: Gynecology;  Laterality: N/A;    There were no vitals filed for this visit.      Subjective Assessment - 01/01/16 1038    Subjective Patient reports her low back is excruciating today. She presents with a new order for chronic cervicalgia and thoracic spine. She states her neck and tspine feel stiff.    Pertinent History  Per medical chart: DDD, Hepatitis C, HTN, cirrhosis, PTSD, depression, seizures (last was in 1998).   Per pt, R thalamic CVA (1998). Lumbar fusion L4/5. Cervical fusion 3-5 2010   Limitations Sitting;Standing   How long can you sit comfortably? one hour   How long can you stand comfortably? 20-25 min    How long can you walk comfortably? 1-2 hours (packing)   Patient Stated Goals decrease pain, improve movement   Currently in Pain? Yes   Pain Score 8    Pain Location Back            [facility] PT Assessment - 01/01/16 0001    Assessment   Medical Diagnosis Lumbago, DDD, cervicalgia and thoracic pain (chronic)   Next MD Visit 01/29/16   ROM / Strength   AROM / PROM / Strength AROM   AROM   AROM Assessment Site Cervical   Cervical Flexion full   Cervical Extension wfl   Cervical - Right Side Bend 30   Cervical - Left Side Bend 25   Cervical - Right Rotation 58   Cervical - Left Rotation 48                     [facility] Adult PT Treatment/Exercise - 01/01/16 0001    Modalities   Modalities Moist Heat;Ultrasound   Moist Heat Therapy   Number Minutes Moist Heat 10 Minutes   Moist Heat Location Lumbar Spine   Ultrasound   Ultrasound Location L lumbar paraspinals and QL   Ultrasound Parameters 1.5 Wcm2 1 mhz cont x 10 min   Ultrasound Goals Pain   Manual Therapy   Manual Therapy Soft tissue mobilization;Myofascial release   Soft tissue mobilization to L lumbar paraspinals and QL   Myofascial Release TPR to L lumbar and QL                     PT Long Term Goals - 01/01/16 1244    PT LONG TERM GOAL #1   Title I with HEP   Time 6   Period Weeks   Status On-going   PT LONG TERM GOAL #2   Title Decreased pain in low back by 50% with ADLS.   Time 6   Period Weeks   Status On-going   PT LONG TERM GOAL #3   Title Improved sitting and walking tolerance to 30 minutes.   Time 4   Period Weeks   Status Achieved   PT LONG TERM GOAL #4   Title Improved left cervical rotation to equal R   Time 4   Period Weeks   Status New               Plan - 01/01/16 1241    Clinical Impression Statement Patient presents with new order to include cervical and thoracic spine, however she had no c/o pain today there just stiffness. Her L Lumbar had increased  tone and tenderness today. TPs were treated with manual TP release vs dry needling per patient request.  Patient reports decreased pain at end of treatment.    Rehab Potential Fair   PT Frequency 2x / week   PT Duration 4 weeks   PT Treatment/Interventions Electrical Stimulation;Moist Heat;Therapeutic exercise;Ultrasound;Neuromuscular re-education;Patient/family education;Manual techniques;Dry needling   PT Next Visit Plan Patient recerted for 4 weeks. Continue modalities and manual therapy as indicated.   Consulted and Agree with Plan of Care Patient      Patient will benefit from skilled therapeutic intervention in order to improve the following deficits and impairments:  Decreased range of motion, Pain, Postural dysfunction, Decreased strength, Decreased activity tolerance  Visit Diagnosis: Left-sided low back pain without sciatica - Plan: PT plan of care cert/re-cert  Cervicalgia - Plan: PT plan of care cert/re-cert  Pain in thoracic spine - Plan: PT plan of care cert/re-cert     Problem List Patient Active Problem List   Diagnosis Date Noted  . 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 Screening for STD (sexually transmitted disease) 12/19/2015  . Vaginal laceration 10/25/2015  . Acute sinusitis with symptoms > 10 days 08/04/2015  . Epigastric mass 04/16/2015  . Abdominal swelling 04/16/2015  . Mucosal abnormality of stomach   . Nausea without vomiting 02/17/2015  . Loss of weight 02/17/2015  . Chronic folliculitis 32/35/5732  . 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 Insomnia 03/12/2014  . Folliculitis 20/25/4270  . Cirrhosis of liver ([place]) 11/15/2013  . Lower extremity edema 11/15/2013  . Abdominal pain, chronic, epigastric 07/10/2013  . Anorexia nervosa 07/10/2013  . 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 Muscle weakness (generalized) 12/28/2011  . CARPAL TUNNEL  SYNDROME 10/16/2008  . ABDOMINAL BLOATING 10/16/2008  . Alcohol abuse 04/25/2008  . TOBACCO ABUSE 04/25/2008  . POST TRAUMATIC STRESS SYNDROME 04/22/2008  . DEPRESSION 04/22/2008  . INTRACRANIAL HEMORRHAGE 04/22/2008  . Esophageal reflux 04/22/2008  . INTUSSUSCEPTION 04/22/2008  . ABDOMINAL ADHESIONS 04/22/2008  . Hepatic cirrhosis due to chronic hepatitis C infection ([place]) 04/22/2008  . OBSTRUCTION OF BILE DUCT 04/22/2008  . LOW BACK PAIN, CHRONIC sees [facility]. 04/22/2008  . Abdominal pain 04/22/2008  . ABDOMINAL PAIN OTHER SPECIFIED SITE 04/22/2008  . HEPATITIS C, HX OF 04/22/2008    Madelyn Flavors PT  01/01/2016, 12:51 PM  [facility] [address] [place], [place], [address] Phone: [phone]   Fax:  [phone]  Name: Sabrina Wyatt MRN: [id number] Date of Birth: 31-Jul-1951  PHYSICAL THERAPY DISCHARGE SUMMARY  Visits from Start of Care: 6.  Current functional level related to goals / functional outcomes: Please see above.   Remaining deficits: Continued pain.   Education / Equipment: HEP. Plan: Patient agrees to discharge.  Patient goals were not met. Patient is being discharged due to not returning since the last visit.  ?????         Mali Applegate MPT

## 2016-01-01 NOTE — Progress Notes (Signed)
Shared Decision Making Visit Lung Cancer Screening Program [phone])   Eligibility:  Age 65 y.o.  Pack Years Smoking History Calculation : 33-pack-year smoking history (# packs/per year x # years smoked)  Recent History of coughing up blood  no  Unexplained weight loss? no ( >Than 15 pounds within the last 6 months )  Prior History Lung / other cancer no (Diagnosis within the last 5 years already requiring surveillance chest CT Scans).  Smoking Status Current Smoker  Former Smokers: Years since quit: NA  Quit Date: NA  Visit Components:  Discussion included one or more decision making aids. yes  Discussion included risk/benefits of screening. yes  Discussion included potential follow up diagnostic testing for abnormal scans. yes  Discussion included meaning and risk of over diagnosis. yes  Discussion included meaning and risk of False Positives. yes  Discussion included meaning of total radiation exposure. yes  Counseling Included:  Importance of adherence to annual lung cancer LDCT screening. yes  Impact of comorbidities on ability to participate in the program. yes  Ability and willingness to under diagnostic treatment. yes  Smoking Cessation Counseling:  Current Smokers:   Discussed importance of smoking cessation. yes  Information about tobacco cessation classes and interventions provided to patient. yes  Patient provided with "ticket" for LDCT Scan. yes  Symptomatic Patient. no  Counseling  Diagnosis Code: Tobacco Use Z72.0  Asymptomatic Patient yes  Counseling (Intermediate counseling: > three minutes counseling) [id number]  Former Smokers:   Discussed the importance of maintaining cigarette abstinence. yes  Diagnosis Code: Personal History of Nicotine Dependence. [id number]  Information about tobacco cessation classes and interventions provided to patient. Yes  Patient provided with "ticket" for LDCT Scan. yes  Written Order for Lung Cancer  Screening with LDCT placed in Epic. Yes (CT Chest Lung Cancer Screening Low Dose W/O CM) [id number] Z12.2-Screening of respiratory organs Z87.891-Personal history of nicotine dependence   I have spent 20 minutes of face to face time with  Sabrina Wyatt discussing the risks and benefits of lung cancer screening. We viewed a power point together that explained in detail the above noted topics. We paused at intervals to allow for questions to be asked and answered to ensure understanding.We discussed that the single most powerful action that she can take to decrease her risk of developing lung cancer is to quit smoking. We discussed whether or not she is ready to commit to setting a quit date. She is currently trying to decrease the number of cigarettes she is smoking daily, but is not ready to set a quit date. We discussed options for tools to aid in quitting smoking including nicotine replacement therapy, non-nicotine medications, support groups, Quit Smart classes, and behavior modification. We discussed that often times setting smaller, more achievable goals, such as eliminating 1 cigarette a day for a week and then 2 cigarettes a day for a week can be helpful in slowly decreasing the number of cigarettes smoked. This allows for a sense of accomplishment as well as providing a clinical benefit. I gave her the " Be Stronger Than Your Excuses" card with contact information for community resources, classes, free nicotine replacement therapy, and access to mobile apps, text messaging, and on-line smoking cessation help. I have also given her my card and contact information in the event she needs to contact me. We discussed the time and location of the scan, and that either June Leap, CMA, or I will call with the results within 24-48 hours of receiving them.  I have provided her with a copy of the power point we viewed  as a resource in the event they need reinforcement of the concepts we discussed today in the  office. The patient verbalized understanding of all of  the above and had no further questions upon leaving the office. They have my contact information in the event they have any further questions.  Magdalen Spatz, NP  01/01/2016

## 2016-01-06 ENCOUNTER — Encounter: Payer: Self-pay | Admitting: Internal Medicine

## 2016-01-06 ENCOUNTER — Encounter: Payer: Self-pay | Admitting: Advanced Practice Midwife

## 2016-01-06 ENCOUNTER — Ambulatory Visit (INDEPENDENT_AMBULATORY_CARE_PROVIDER_SITE_OTHER): Payer: Medicare Other | Admitting: Advanced Practice Midwife

## 2016-01-06 ENCOUNTER — Encounter: Payer: Self-pay | Admitting: Physical Therapy

## 2016-01-06 VITALS — BP 146/76 | HR 88 | Ht 65.0 in | Wt 150.0 lb

## 2016-01-06 DIAGNOSIS — T192XXA Foreign body in vulva and vagina, initial encounter: Secondary | ICD-10-CM | POA: Diagnosis not present

## 2016-01-06 DIAGNOSIS — A549 Gonococcal infection, unspecified: Secondary | ICD-10-CM

## 2016-01-06 DIAGNOSIS — Z118 Encounter for screening for other infectious and parasitic diseases: Secondary | ICD-10-CM | POA: Diagnosis not present

## 2016-01-06 DIAGNOSIS — R3129 Other microscopic hematuria: Secondary | ICD-10-CM | POA: Diagnosis not present

## 2016-01-06 DIAGNOSIS — R319 Hematuria, unspecified: Secondary | ICD-10-CM | POA: Diagnosis not present

## 2016-01-06 DIAGNOSIS — A749 Chlamydial infection, unspecified: Secondary | ICD-10-CM | POA: Diagnosis not present

## 2016-01-06 NOTE — Progress Notes (Signed)
[facility] Visit  Patient name: DAYLA FRAPPIER MRN [id number]  Date of birth: [birth year]  CC & HPI:  BOBETTE BERTKE is a 65 y.o. Caucasian female presenting today for retained condom.  Had sex 3 times last night/this am, and only found 2 condoms.  Saw frank blood in urine when urinating, denies UTI sx.  No pain/odor  Pertinent History Reviewed:  Medical & Surgical Hx:   Past Medical History  Diagnosis Date  . GERD (gastroesophageal reflux disease)   . Cirrhosis, hepatitis C     CT on 04/21/15= no HCC, pt has been vaccinated for Hep A and Hep B. s/p treatment of HCV with eradication  . Depression   . Carpal tunnel syndrome   . S/P endoscopy 08/27/10    antral erosions, otherwise normal, due egd 08/2012 to screen for varices  . Abdominal wall pain     chronic  . Pulmonary nodules   . PTSD (post-traumatic stress disorder)   . Chronic low back pain   . 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 Polysubstance abuse     HX of  . S/P colonoscopy 2005    Dr Moss Mc polyp removed, otherwise normal  . Hypertension   . Tobacco abuse   . Intussusception ([place]) 04/2012  . Shortness of breath   . Chronic abdominal pain   . Biliary stone   . Seizures ([place]) 1998    with brain bleed x1. None since then and on no meds  . Chronic flank pain   . Gonorrhea    Past Surgical History  Procedure Laterality Date  . Cholecystectomy  2002  . Percutaneous transhepatic cholangiograhpy and billiary drainage  2002  . Roux-en-y-hepatojejunostomy  2003    revision in 2013 for intussusception [facility] Dr. Bailey Mech)  . Spinal fusion      C5-C7  . Carpal tunnel release Bilateral 12/2010  . Esophagogastroduodenoscopy    09/10/2003    Small hiatal hernia; otherwise normal stomach, normal D1 and D2  . Colonoscopy    09/10/2003    diminutive polyp in the rectum cold biopsied/removed/ Normal colon  . Esophagogastroduodenoscopy  08/27/2010    [id number] esophagus.  No varices.   Couple of tiny antral erosions of doubtful clinical significance, otherwise normal stomach, D1 and D2.  . Esophagogastroduodenoscopy (egd) with propofol N/A 08/02/2013    RMR: Portal gastropathy. No explanation for abdominal pain which I believe is more abdominal wall in origin. She has been seen by Dr. Carlis Abbott  over at [facility]. She is up-to-date on cross sectional imaging. She has a pain management physician in [place]. Repeat EGD for varices in 3 years.  . Colonoscopy with propofol N/A 08/02/2013    [id number] diverticulosis. next TCS 10 years.  . Biliary stone removal  2015  . Lumbar fusion    . Nose surgery  1970  . Esophagogastroduodenoscopy (egd) with propofol N/A 03/06/2015    [id number] gastric mucosac s/p bx  . Biopsy N/A 03/06/2015    Procedure: BIOPSY;  Surgeon: Daneil Dolin, MD;  Location: [facility];  Service: Endoscopy;  Laterality: N/A;  . Repair vaginal cuff N/A 10/25/2015    Procedure: REPAIR VAGINAL LACERATION ;  Surgeon: Jonnie Kind, MD;  Location: [facility];  Service: Gynecology;  Laterality: N/A;   Family History  Problem Relation Age of Onset  . Coronary artery disease Brother   . Cancer Sister     lymphoma  . Cancer Father  brain    Current outpatient prescriptions:  .  acyclovir (ZOVIRAX) 400 MG tablet, TAKE AS DIRECTED. (Patient taking differently: TAKE ONE TABLET BY MOUTH TWICE DAILY), Disp: 40 tablet, Rfl: 11 .  albuterol (PROVENTIL) (2.5 MG/3ML) 0.083% nebulizer solution, Take 2.5 mg by nebulization every 6 (six) hours as needed for wheezing or shortness of breath. , Disp: , Rfl:  .  ALPRAZolam (XANAX) 0.5 MG tablet, Take 0.5 mg by mouth at bedtime., Disp: , Rfl:  .  atenolol (TENORMIN) 25 MG tablet, Take 25 mg by mouth every morning. , Disp: , Rfl:  .  Cholecalciferol (VITAMIN D-3) 1000 UNITS CAPS, Take 1,000 capsules by mouth daily., Disp: , Rfl:  .  DULoxetine (CYMBALTA) 60 MG capsule, Take 60 mg by mouth 2 (two) times daily. , Disp: , Rfl:  .  EPIPEN  2-PAK 0.3 MG/0.3ML SOAJ injection, Inject 0.3 mg into the skin as needed (allergic reactions). , Disp: , Rfl:  .  ESTRACE VAGINAL 0.1 MG/GM vaginal cream, PLACE 1GM VAGINALLY AT BEDTIME., Disp: 42.5 g, Rfl: 4 .  etodolac (LODINE) 400 MG tablet, Take 400 mg by mouth 2 (two) times daily. , Disp: , Rfl:  .  ferrous sulfate 325 (65 FE) MG tablet, Take 325 mg by mouth daily with breakfast., Disp: , Rfl:  .  fluticasone (FLONASE) 50 MCG/ACT nasal spray, Place 2 sprays into the nose daily.  , Disp: , Rfl:  .  hydrOXYzine (VISTARIL) 50 MG capsule, Take 100 mg by mouth daily., Disp: , Rfl:  .  levocetirizine (XYZAL) 5 MG tablet, Take 5 mg by mouth daily., Disp: , Rfl:  .  Omega-3 Fatty Acids (FISH OIL PO), Take 2 capsules by mouth daily. Reported on 09/26/2015, Disp: , Rfl:  .  omeprazole (PRILOSEC) 20 MG capsule, TAKE ONE CAPSULE BY MOUTH EVERY DAY, Disp: 30 capsule, Rfl: 11 .  Oxycodone HCl 10 MG TABS, Take 1 tablet (10 mg total) by mouth 3 (three) times daily as needed. (Patient taking differently: Take 10 mg by mouth 3 (three) times daily. ), Disp: 30 tablet, Rfl: 0 .  PROAIR HFA 108 (90 BASE) MCG/ACT inhaler, Inhale 2 puffs into the lungs every 6 (six) hours as needed for wheezing or shortness of breath. , Disp: , Rfl:  .  pyridOXINE (VITAMIN B-6) 100 MG tablet, Take 100 mg by mouth daily., Disp: , Rfl:  .  tiZANidine (ZANAFLEX) 4 MG tablet, Take 4 mg by mouth 2 (two) times daily as needed for muscle spasms. , Disp: , Rfl:  .  traMADol (ULTRAM) 50 MG tablet, Take 1 tablet (50 mg total) by mouth every 6 (six) hours as needed for moderate pain., Disp: 30 tablet, Rfl: 0 .  traZODone (DESYREL) 50 MG tablet, Take 2 tablets by mouth at bedtime as needed for sleep. , Disp: , Rfl:  .  azithromycin (ZITHROMAX) 500 MG tablet, Take 2 tablets (1,000 mg total) by mouth once. (Patient not taking: Reported on 01/02/2016), Disp: 2 tablet, Rfl: 1 Social History: Reviewed -  reports that she has been smoking Cigarettes.  She  has a 33 pack-year smoking history. She has never used smokeless tobacco.  Review of Systems:   Constitutional: Negative for fever and chills Eyes: Negative for visual disturbances Respiratory: Negative for shortness of breath, dyspnea Cardiovascular: Negative for chest pain or palpitations  Gastrointestinal: Negative for vomiting, diarrhea and constipation; no abdominal pain Genitourinary: Negative for dysuria and urgency, vaginal irritation or itching Musculoskeletal: Negative for back pain, joint pain, myalgias  Neurological: Negative for dizziness and headaches    Objective Findings:    Physical Examination: General appearance - well appearing, and in no distress Mental status - alert, oriented to person, place, and time Chest:  Normal respiratory effort Heart - normal rate and regular rhythm Abdomen:  Soft, nontender Pelvic: SSE:  No condom, no blood Musculoskeletal:  Normal range of motion without pain Extremities:  No edema    No results found for this or any previous visit (from the past 24 hour(s)).    Assessment & Plan:  A:   No condom found  GC/CHL, treated 5/3 P:  Orders for POC given, to take to [facility] next week  Will run urine culture d/t c/o frank blood in urine  Return if symptoms worsen or fail to improve, for cancel 5/31 w/JVF.  CRESENZO-DISHMAN,Acxel Dingee CNM 01/06/2016 2:49 PM

## 2016-01-06 NOTE — Addendum Note (Signed)
Addended by: Christin Fudge on: 01/06/2016 02:59 PM   Modules accepted: Orders

## 2016-01-07 DIAGNOSIS — G8929 Other chronic pain: Secondary | ICD-10-CM | POA: Diagnosis not present

## 2016-01-07 DIAGNOSIS — J449 Chronic obstructive pulmonary disease, unspecified: Secondary | ICD-10-CM | POA: Diagnosis not present

## 2016-01-07 LAB — URINE CULTURE

## 2016-01-07 LAB — GC/CHLAMYDIA PROBE AMP
Chlamydia trachomatis, NAA: NEGATIVE
Neisseria gonorrhoeae by PCR: NEGATIVE

## 2016-01-08 ENCOUNTER — Ambulatory Visit: Payer: Medicare Other | Admitting: Physical Therapy

## 2016-01-08 DIAGNOSIS — M545 Low back pain, unspecified: Secondary | ICD-10-CM

## 2016-01-08 DIAGNOSIS — M546 Pain in thoracic spine: Secondary | ICD-10-CM | POA: Diagnosis not present

## 2016-01-08 DIAGNOSIS — M542 Cervicalgia: Secondary | ICD-10-CM | POA: Diagnosis not present

## 2016-01-08 NOTE — Patient Instructions (Signed)
Pelvic Press  [id number]   Place hands under belly between navel and pubic bone, palms up. Feel pressure on hands. Increase pressure on hands by pressing pelvis down. This is NOT a pelvic tilt. Hold __5_ seconds. Relax. Repeat _10__ times. Once a day.  KNEE: Flexion - Prone   Hold pelvic press. Bend knee. Raise heel toward buttocks. Repeat on opposite leg. Do not raise hips. _10__ reps per set. When this is mastered, pull both heels up at same time, x 10 reps.  Once a day   Hip Extension (Prone)  Hold pelvic press  Lift left leg _3___ inches from floor, keeping knee locked. Repeat __10__ times per set. Do _1___ sets per session. Do _10___ sessions per day.  HIP: Extension / KNEE: Flexion - Prone    Hold pelvic press. Bend knee, squeeze glutes. Raise leg up  10___ reps per set, _2__ sets per day, _5__ days per week  Axial Extension- Upper body sequence * always start with pelvic press    Lie on stomach with forehead resting on floor and arms at sides. Tuck chin in and raise head from floor without bending it up or down. Repeat ___10_ times per set. Do __1__ sets per session. Do _1___ sessions per day.  Progression:  Arms at side Arms in T shape Arms in W shape  Arms in M shape Arms in Y shape  Madelyn Flavors, PT 01/08/2016 1:25 PM [facility] [place], [place], [address] Phone: [phone]   Fax:  [phone]

## 2016-01-08 NOTE — Therapy (Signed)
[facility] [place], [place], [address] Phone: [phone]   Fax:  [phone]  Physical Therapy Treatment  Patient Details  Name: Sabrina Wyatt MRN: [id number] Date of Birth: [birth year] Referring Provider: Dr. [name] Fava  Encounter Date: 01/08/2016      PT End of Session - 01/08/16 1257    Visit Number 7   Number of Visits 14   Date for PT Re-Evaluation 01/29/16   Authorization Type [employer] Medicare - GCodes required   PT Start Time 1300   PT Stop Time 1357   PT Time Calculation (min) 57 min   Activity Tolerance Patient tolerated treatment well   Behavior During Therapy [facility] for tasks assessed/performed      Past Medical History  Diagnosis Date  . GERD (gastroesophageal reflux disease)   . Cirrhosis, hepatitis C     CT on 04/21/15= no HCC, pt has been vaccinated for Hep A and Hep B. s/p treatment of HCV with eradication  . Depression   . Carpal tunnel syndrome   . S/P endoscopy 08/27/10    antral erosions, otherwise normal, due egd 08/2012 to screen for varices  . Abdominal wall pain     chronic  . Pulmonary nodules   . PTSD (post-traumatic stress disorder)   . Chronic low back pain   . 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 Polysubstance abuse     HX of  . S/P colonoscopy 2005    Dr Moss Mc polyp removed, otherwise normal  . Hypertension   . Tobacco abuse   . Intussusception ([place]) 04/2012  . Shortness of breath   . Chronic abdominal pain   . Biliary stone   . Seizures ([place]) 1998    with brain bleed x1. None since then and on no meds  . Chronic flank pain   . Gonorrhea     Past Surgical History  Procedure Laterality Date  . Cholecystectomy  2002  . Percutaneous transhepatic cholangiograhpy and billiary drainage  2002  . Roux-en-y-hepatojejunostomy  2003    revision in 2013 for intussusception [facility] Dr. Bailey Mech)  . Spinal fusion      C5-C7  . Carpal tunnel release Bilateral 12/2010   . Esophagogastroduodenoscopy    09/10/2003    Small hiatal hernia; otherwise normal stomach, normal D1 and D2  . Colonoscopy    09/10/2003    diminutive polyp in the rectum cold biopsied/removed/ Normal colon  . Esophagogastroduodenoscopy  08/27/2010    [id number] esophagus.  No varices.  Couple of tiny antral erosions of doubtful clinical significance, otherwise normal stomach, D1 and D2.  . Esophagogastroduodenoscopy (egd) with propofol N/A 08/02/2013    RMR: Portal gastropathy. No explanation for abdominal pain which I believe is more abdominal wall in origin. She has been seen by Dr. Carlis Abbott  over at [facility]. She is up-to-date on cross sectional imaging. She has a pain management physician in [place]. Repeat EGD for varices in 3 years.  . Colonoscopy with propofol N/A 08/02/2013    [id number] diverticulosis. next TCS 10 years.  . Biliary stone removal  2015  . Lumbar fusion    . Nose surgery  1970  . Esophagogastroduodenoscopy (egd) with propofol N/A 03/06/2015    [id number] gastric mucosac s/p bx  . Biopsy N/A 03/06/2015    Procedure: BIOPSY;  Surgeon: Daneil Dolin, MD;  Location: [facility];  Service: Endoscopy;  Laterality: N/A;  . Repair vaginal cuff N/A 10/25/2015    Procedure:  REPAIR VAGINAL LACERATION ;  Surgeon: Jonnie Kind, MD;  Location: [facility];  Service: Gynecology;  Laterality: N/A;    There were no vitals filed for this visit.      Subjective Assessment - 01/08/16 1257    Subjective Patient reports her back is still hurting. She has a new lumbar brace which she reports is "great".    Pertinent History  Per medical chart: DDD, Hepatitis C, HTN, cirrhosis, PTSD, depression, seizures (last was in 1998).   Per pt, R thalamic CVA (1998). Lumbar fusion L4/5. Cervical fusion 3-5 2010   How long can you sit comfortably? one hour   How long can you stand comfortably? 20-25 min   How long can you walk comfortably? 1-2 hours (packing)   Patient Stated Goals decrease pain,  improve movement   Currently in Pain? Yes   Pain Score 9    Pain Location Back   Pain Orientation Lower  right in center   Pain Descriptors / Indicators Constant   Pain Type Chronic pain   Pain Onset More than a month ago   Pain Frequency Constant   Pain Relieving Factors meds                         [facility] Adult PT Treatment/Exercise - 01/08/16 0001    Lumbar Exercises: Prone   Straight Leg Raise 10 reps   Other Prone Lumbar Exercises pelvic press series with hold, unilateral and bilateral knee flexion; mule kicks x 5 on R; difficult on L side  Then with arm series x 5 ea W, M, Y   Modalities   Modalities Moist Heat;Ultrasound   Moist Heat Therapy   Number Minutes Moist Heat 10 Minutes   Moist Heat Location Other (comment);Lumbar Spine  abdomen during TE/manual   Manual Therapy   Manual Therapy Soft tissue mobilization;Myofascial release   Soft tissue mobilization to R gluteals at PSIS area   Myofascial Release TPR to glutes along R iliac crest close to PSIS                     PT Long Term Goals - 01/01/16 1244    PT LONG TERM GOAL #1   Title I with HEP   Time 6   Period Weeks   Status On-going   PT LONG TERM GOAL #2   Title Decreased pain in low back by 50% with ADLS.   Time 6   Period Weeks   Status On-going   PT LONG TERM GOAL #3   Title Improved sitting and walking tolerance to 30 minutes.   Time 4   Period Weeks   Status Achieved   PT LONG TERM GOAL #4   Title Improved left cervical rotation to equal R   Time 4   Period Weeks   Status New               Plan - 01/08/16 1349    Clinical Impression Statement Patient's pain isolated to area of R PSIS today and in mid back at same level. Patient tolerated TE very well today. She has difficulty stabilizing with R lumbar spine during mule kicks mainly. She has active and latent TPs in gluteals along iliac crest, but refused DN. She responded well to manual and was educated in  self TP release using tennis ball. Goals are ongoing.   Rehab Potential Fair   PT Frequency 2x / week   PT Duration  4 weeks   PT Treatment/Interventions Electrical Stimulation;Moist Heat;Therapeutic exercise;Ultrasound;Neuromuscular re-education;Patient/family education;Manual techniques;Dry needling   PT Next Visit Plan Continue core stabilization, maual/STW as indicated.    PT Home Exercise Plan pelvic press series lower and upper   Consulted and Agree with Plan of Care Patient      Patient will benefit from skilled therapeutic intervention in order to improve the following deficits and impairments:  Decreased range of motion, Pain, Postural dysfunction, Decreased strength, Decreased activity tolerance  Visit Diagnosis: Left-sided low back pain without sciatica     Problem List Patient Active Problem List   Diagnosis Date Noted  . 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 Screening for STD (sexually transmitted disease) 12/19/2015  . Vaginal laceration 10/25/2015  . Acute sinusitis with symptoms > 10 days 08/04/2015  . Epigastric mass 04/16/2015  . Abdominal swelling 04/16/2015  . Mucosal abnormality of stomach   . Nausea without vomiting 02/17/2015  . Loss of weight 02/17/2015  . Chronic folliculitis [id number]  . 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 Insomnia 03/12/2014  . Folliculitis [id number]  . Cirrhosis of liver ([place]) 11/15/2013  . Lower extremity edema 11/15/2013  . Abdominal pain, chronic, epigastric 07/10/2013  . Anorexia nervosa 07/10/2013  . 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 Muscle weakness (generalized) 12/28/2011  . CARPAL TUNNEL SYNDROME 10/16/2008  . ABDOMINAL BLOATING 10/16/2008  . Alcohol abuse 04/25/2008  . TOBACCO ABUSE 04/25/2008  . POST TRAUMATIC STRESS SYNDROME 04/22/2008  . DEPRESSION 04/22/2008  . INTRACRANIAL HEMORRHAGE 04/22/2008  .  Esophageal reflux 04/22/2008  . INTUSSUSCEPTION 04/22/2008  . ABDOMINAL ADHESIONS 04/22/2008  . Hepatic cirrhosis due to chronic hepatitis C infection ([place]) 04/22/2008  . OBSTRUCTION OF BILE DUCT 04/22/2008  . LOW BACK PAIN, CHRONIC sees [facility]. 04/22/2008  . Abdominal pain 04/22/2008  . ABDOMINAL PAIN OTHER SPECIFIED SITE 04/22/2008  . HEPATITIS C, HX OF 04/22/2008    Madelyn Flavors PT  01/08/2016, 1:53 PM  [facility] [place], [place], [address] Phone: [phone]   Fax:  [phone]  Name: Sabrina Wyatt MRN: [id number] Date of Birth: 12/28/1950

## 2016-01-09 DIAGNOSIS — M79672 Pain in left foot: Secondary | ICD-10-CM | POA: Diagnosis not present

## 2016-01-09 DIAGNOSIS — M79671 Pain in right foot: Secondary | ICD-10-CM | POA: Diagnosis not present

## 2016-01-09 DIAGNOSIS — G894 Chronic pain syndrome: Secondary | ICD-10-CM | POA: Diagnosis not present

## 2016-01-09 DIAGNOSIS — M542 Cervicalgia: Secondary | ICD-10-CM | POA: Diagnosis not present

## 2016-01-09 DIAGNOSIS — Z79891 Long term (current) use of opiate analgesic: Secondary | ICD-10-CM | POA: Diagnosis not present

## 2016-01-09 NOTE — Patient Instructions (Signed)
Your procedure is scheduled on: 01/15/2016  Report to [facility] at  1110  AM.  Call this number if you have problems the morning of surgery: [phone]   Do not eat food or drink liquids :After Midnight.      Take these medicines the morning of surgery with A SIP OF WATER: zovirax, xanax, atenolol, cymbalta, lodine, xyzal, prilosec, oxycodone or tramdol, zanaflex (if needed). Take your nebulizer before you come. Take your inhaler before you come and bring it with you.   Do not wear jewelry, make-up or nail polish.  Do not wear lotions, powders, or perfumes. You may wear deodorant.  Do not shave 48 hours prior to surgery.  Do not bring valuables to the hospital.  Contacts, dentures or bridgework may not be worn into surgery.  Leave suitcase in the car. After surgery it may be brought to your room.  For patients admitted to the hospital, checkout time is 11:00 AM the day of discharge.   Patients discharged the day of surgery will not be allowed to drive home.  :     Please read over the following fact sheets that you were given: Coughing and Deep Breathing, Surgical Site Infection Prevention, Anesthesia Post-op Instructions and Care and Recovery After Surgery    Cataract A cataract is a clouding of the lens of the eye. When a lens becomes cloudy, vision is reduced based on the degree and nature of the clouding. Many cataracts reduce vision to some degree. Some cataracts make people more near-sighted as they develop. Other cataracts increase glare. Cataracts that are ignored and become worse can sometimes look white. The white color can be seen through the pupil. CAUSES   Aging. However, cataracts may occur at any age, even in newborns.   Certain drugs.   Trauma to the eye.   Certain diseases such as diabetes.   Specific eye diseases such as chronic inflammation inside the eye or a sudden attack of a rare form of glaucoma.   Inherited or acquired medical problems.  SYMPTOMS    Gradual, progressive drop in vision in the affected eye.   Severe, rapid visual loss. This most often happens when trauma is the cause.  DIAGNOSIS  To detect a cataract, an eye doctor examines the lens. Cataracts are best diagnosed with an exam of the eyes with the pupils enlarged (dilated) by drops.  TREATMENT  For an early cataract, vision may improve by using different eyeglasses or stronger lighting. If that does not help your vision, surgery is the only effective treatment. A cataract needs to be surgically removed when vision loss interferes with your everyday activities, such as driving, reading, or watching TV. A cataract may also have to be removed if it prevents examination or treatment of another eye problem. Surgery removes the cloudy lens and usually replaces it with a substitute lens (intraocular lens, IOL).  At a time when both you and your doctor agree, the cataract will be surgically removed. If you have cataracts in both eyes, only one is usually removed at a time. This allows the operated eye to heal and be out of danger from any possible problems after surgery (such as infection or poor wound healing). In rare cases, a cataract may be doing damage to your eye. In these cases, your caregiver may advise surgical removal right away. The vast majority of people who have cataract surgery have better vision afterward. HOME CARE INSTRUCTIONS  If you are not planning surgery, you may  be asked to do the following:  Use different eyeglasses.   Use stronger or brighter lighting.   Ask your eye doctor about reducing your medicine dose or changing medicines if it is thought that a medicine caused your cataract. Changing medicines does not make the cataract go away on its own.   Become familiar with your surroundings. Poor vision can lead to injury. Avoid bumping into things on the affected side. You are at a higher risk for tripping or falling.   Exercise extreme care when driving or  operating machinery.   Wear sunglasses if you are sensitive to bright light or experiencing problems with glare.  SEEK IMMEDIATE MEDICAL CARE IF:   You have a worsening or sudden vision loss.   You notice redness, swelling, or increasing pain in the eye.   You have a fever.  Document Released: 08/09/2005 Document Revised: 07/29/2011 Document Reviewed: 04/02/2011 [facility] Patient Information 2012 [place].PATIENT INSTRUCTIONS POST-ANESTHESIA  IMMEDIATELY FOLLOWING SURGERY:  Do not drive or operate machinery for the first twenty four hours after surgery.  Do not make any important decisions for twenty four hours after surgery or while taking narcotic pain medications or sedatives.  If you develop intractable nausea and vomiting or a severe headache please notify your doctor immediately.  FOLLOW-UP:  Please make an appointment with your surgeon as instructed. You do not need to follow up with anesthesia unless specifically instructed to do so.  WOUND CARE INSTRUCTIONS (if applicable):  Keep a dry clean dressing on the anesthesia/puncture wound site if there is drainage.  Once the wound has quit draining you may leave it open to air.  Generally you should leave the bandage intact for twenty four hours unless there is drainage.  If the epidural site drains for more than 36-48 hours please call the anesthesia department.  QUESTIONS?:  Please feel free to call your physician or the hospital operator if you have any questions, and they will be happy to assist you.

## 2016-01-13 ENCOUNTER — Encounter (HOSPITAL_COMMUNITY): Payer: Self-pay

## 2016-01-13 ENCOUNTER — Encounter (HOSPITAL_COMMUNITY)
Admission: RE | Admit: 2016-01-13 | Discharge: 2016-01-13 | Disposition: A | Discharge status: Home / Self Care | Payer: Medicare Other | Source: Ambulatory Visit | Attending: Ophthalmology | Admitting: Ophthalmology

## 2016-01-13 DIAGNOSIS — R569 Unspecified convulsions: Secondary | ICD-10-CM | POA: Diagnosis not present

## 2016-01-13 DIAGNOSIS — K219 Gastro-esophageal reflux disease without esophagitis: Secondary | ICD-10-CM | POA: Diagnosis not present

## 2016-01-13 DIAGNOSIS — Z7951 Long term (current) use of inhaled steroids: Secondary | ICD-10-CM | POA: Diagnosis not present

## 2016-01-13 DIAGNOSIS — Z8673 Personal history of transient ischemic attack (TIA), and cerebral infarction without residual deficits: Secondary | ICD-10-CM | POA: Diagnosis not present

## 2016-01-13 DIAGNOSIS — K7460 Unspecified cirrhosis of liver: Secondary | ICD-10-CM | POA: Diagnosis not present

## 2016-01-13 DIAGNOSIS — Z79899 Other long term (current) drug therapy: Secondary | ICD-10-CM | POA: Diagnosis not present

## 2016-01-13 DIAGNOSIS — I10 Essential (primary) hypertension: Secondary | ICD-10-CM | POA: Diagnosis not present

## 2016-01-13 DIAGNOSIS — H25811 Combined forms of age-related cataract, right eye: Secondary | ICD-10-CM | POA: Diagnosis not present

## 2016-01-13 DIAGNOSIS — Z01812 Encounter for preprocedural laboratory examination: Secondary | ICD-10-CM | POA: Diagnosis not present

## 2016-01-13 DIAGNOSIS — J449 Chronic obstructive pulmonary disease, unspecified: Secondary | ICD-10-CM | POA: Diagnosis not present

## 2016-01-13 DIAGNOSIS — F4310 Post-traumatic stress disorder, unspecified: Secondary | ICD-10-CM | POA: Diagnosis not present

## 2016-01-13 DIAGNOSIS — M1991 Primary osteoarthritis, unspecified site: Secondary | ICD-10-CM | POA: Diagnosis not present

## 2016-01-13 HISTORY — DX: Unspecified osteoarthritis, unspecified site: M19.90

## 2016-01-13 LAB — BASIC METABOLIC PANEL
Anion gap: 7 (ref 5–15)
BUN: 13 mg/dL (ref 6–20)
CO2: 28 mmol/L (ref 22–32)
Calcium: 8.6 mg/dL — ABNORMAL LOW (ref 8.9–10.3)
Chloride: 106 mmol/L (ref 101–111)
Creatinine, Ser: 0.67 mg/dL (ref 0.44–1.00)
GFR calc Af Amer: >60 mL/min (ref >60)
GFR calc non Af Amer: >60 mL/min (ref >60)
Glucose, Bld: 110 mg/dL — ABNORMAL HIGH (ref 65–99)
Potassium: 4.1 mmol/L (ref 3.5–5.1)
Sodium: 141 mmol/L (ref 135–145)

## 2016-01-13 LAB — CBC
HCT: 38.2 % (ref 36.0–46.0)
Hemoglobin: 11.9 g/dL — ABNORMAL LOW (ref 12.0–15.0)
MCH: 28.3 pg (ref 26.0–34.0)
MCHC: 31.2 g/dL (ref 30.0–36.0)
MCV: 91 fL (ref 78.0–100.0)
Platelets: 166 10*3/uL (ref 150–400)
RBC: 4.2 MIL/uL (ref 3.87–5.11)
RDW: 14.8 % (ref 11.5–15.5)
WBC: 6.5 10*3/uL (ref 4.0–10.5)

## 2016-01-13 NOTE — Pre-Procedure Instructions (Signed)
Patient given information to sign up for my chart at home. 

## 2016-01-14 ENCOUNTER — Encounter: Payer: Self-pay | Admitting: Physical Therapy

## 2016-01-15 ENCOUNTER — Ambulatory Visit (HOSPITAL_COMMUNITY)
Admission: RE | Admit: 2016-01-15 | Discharge: 2016-01-15 | Disposition: A | Discharge status: Home / Self Care | Payer: Medicare Other | Source: Ambulatory Visit | Attending: Ophthalmology | Admitting: Ophthalmology

## 2016-01-15 ENCOUNTER — Ambulatory Visit (HOSPITAL_COMMUNITY): Payer: Medicare Other | Admitting: Anesthesiology

## 2016-01-15 ENCOUNTER — Encounter (HOSPITAL_COMMUNITY)
Admission: RE | Disposition: A | Discharge status: Home / Self Care | Payer: Self-pay | Source: Ambulatory Visit | Attending: Ophthalmology

## 2016-01-15 ENCOUNTER — Encounter (HOSPITAL_COMMUNITY): Payer: Self-pay | Admitting: Anesthesiology

## 2016-01-15 DIAGNOSIS — I10 Essential (primary) hypertension: Secondary | ICD-10-CM | POA: Diagnosis not present

## 2016-01-15 DIAGNOSIS — R569 Unspecified convulsions: Secondary | ICD-10-CM | POA: Insufficient documentation

## 2016-01-15 DIAGNOSIS — K7460 Unspecified cirrhosis of liver: Secondary | ICD-10-CM | POA: Insufficient documentation

## 2016-01-15 DIAGNOSIS — J449 Chronic obstructive pulmonary disease, unspecified: Secondary | ICD-10-CM | POA: Insufficient documentation

## 2016-01-15 DIAGNOSIS — K219 Gastro-esophageal reflux disease without esophagitis: Secondary | ICD-10-CM | POA: Insufficient documentation

## 2016-01-15 DIAGNOSIS — Z8673 Personal history of transient ischemic attack (TIA), and cerebral infarction without residual deficits: Secondary | ICD-10-CM | POA: Diagnosis not present

## 2016-01-15 DIAGNOSIS — Z7951 Long term (current) use of inhaled steroids: Secondary | ICD-10-CM | POA: Diagnosis not present

## 2016-01-15 DIAGNOSIS — M1991 Primary osteoarthritis, unspecified site: Secondary | ICD-10-CM | POA: Insufficient documentation

## 2016-01-15 DIAGNOSIS — H25811 Combined forms of age-related cataract, right eye: Secondary | ICD-10-CM | POA: Diagnosis not present

## 2016-01-15 DIAGNOSIS — Z01812 Encounter for preprocedural laboratory examination: Secondary | ICD-10-CM | POA: Insufficient documentation

## 2016-01-15 DIAGNOSIS — Z79899 Other long term (current) drug therapy: Secondary | ICD-10-CM | POA: Diagnosis not present

## 2016-01-15 DIAGNOSIS — F4310 Post-traumatic stress disorder, unspecified: Secondary | ICD-10-CM | POA: Diagnosis not present

## 2016-01-15 DIAGNOSIS — H269 Unspecified cataract: Secondary | ICD-10-CM | POA: Diagnosis not present

## 2016-01-15 HISTORY — PX: CATARACT EXTRACTION W/PHACO: SHX586

## 2016-01-15 SURGERY — PHACOEMULSIFICATION, CATARACT, WITH IOL INSERTION
Anesthesia: Monitor Anesthesia Care | Site: Eye | Laterality: Right

## 2016-01-15 MED ORDER — EPINEPHRINE HCL 1 MG/ML IJ SOLN
INTRAOCULAR | Status: DC | PRN
  Administered 2016-01-15: 500 mL

## 2016-01-15 MED ORDER — FENTANYL CITRATE (PF) 100 MCG/2ML IJ SOLN
INTRAMUSCULAR | Status: AC
  Filled 2016-01-15: qty 2

## 2016-01-15 MED ORDER — MIDAZOLAM HCL 2 MG/2ML IJ SOLN
INTRAMUSCULAR | Status: AC
  Filled 2016-01-15: qty 2

## 2016-01-15 MED ORDER — MIDAZOLAM HCL 2 MG/2ML IJ SOLN
1.0000 mg | INTRAMUSCULAR | Status: DC | PRN
  Administered 2016-01-15: 2 mg via INTRAVENOUS

## 2016-01-15 MED ORDER — LIDOCAINE HCL (PF) 1 % IJ SOLN
INTRAMUSCULAR | Status: DC | PRN
  Administered 2016-01-15: .3 mL

## 2016-01-15 MED ORDER — CYCLOPENTOLATE-PHENYLEPHRINE 0.2-1 % OP SOLN
1.0000 [drp] | OPHTHALMIC | Status: AC
  Administered 2016-01-15 (×3): 1 [drp] via OPHTHALMIC

## 2016-01-15 MED ORDER — BSS IO SOLN
INTRAOCULAR | Status: DC | PRN
  Administered 2016-01-15: 15 mL

## 2016-01-15 MED ORDER — NEOMYCIN-POLYMYXIN-DEXAMETH 3.5-10000-0.1 OP SUSP
OPHTHALMIC | Status: DC | PRN
  Administered 2016-01-15: 2 [drp] via OPHTHALMIC

## 2016-01-15 MED ORDER — PHENYLEPHRINE HCL 2.5 % OP SOLN
1.0000 [drp] | OPHTHALMIC | Status: AC
  Administered 2016-01-15 (×3): 1 [drp] via OPHTHALMIC

## 2016-01-15 MED ORDER — LIDOCAINE HCL 3.5 % OP GEL
1.0000 "application " | Freq: Once | OPHTHALMIC | Status: AC
  Administered 2016-01-15: 1 via OPHTHALMIC

## 2016-01-15 MED ORDER — POVIDONE-IODINE 5 % OP SOLN
OPHTHALMIC | Status: DC | PRN
  Administered 2016-01-15: 1 via OPHTHALMIC

## 2016-01-15 MED ORDER — PROVISC 10 MG/ML IO SOLN
INTRAOCULAR | Status: DC | PRN
  Administered 2016-01-15: 0.85 mL via INTRAOCULAR

## 2016-01-15 MED ORDER — LACTATED RINGERS IV SOLN
INTRAVENOUS | Status: DC
  Administered 2016-01-15: 1000 mL via INTRAVENOUS

## 2016-01-15 MED ORDER — TETRACAINE HCL 0.5 % OP SOLN
1.0000 [drp] | OPHTHALMIC | Status: AC
  Administered 2016-01-15 (×3): 1 [drp] via OPHTHALMIC

## 2016-01-15 MED ORDER — EPINEPHRINE HCL 1 MG/ML IJ SOLN
INTRAMUSCULAR | Status: AC
  Filled 2016-01-15: qty 1

## 2016-01-15 MED ORDER — FENTANYL CITRATE (PF) 100 MCG/2ML IJ SOLN
25.0000 ug | INTRAMUSCULAR | Status: AC
  Administered 2016-01-15 (×2): 25 ug via INTRAVENOUS

## 2016-01-15 SURGICAL SUPPLY — 12 items
CLOTH BEACON ORANGE TIMEOUT ST (SAFETY) ×1 IMPLANT
EYE SHIELD UNIVERSAL CLEAR (GAUZE/BANDAGES/DRESSINGS) ×1 IMPLANT
GLOVE BIOGEL PI IND STRL 6.5 (GLOVE) IMPLANT
GLOVE BIOGEL PI INDICATOR 6.5 (GLOVE) ×1
GLOVE SKINSENSE NS SZ6.5 (GLOVE) ×1
GLOVE SKINSENSE STRL SZ6.5 (GLOVE) IMPLANT
PAD ARMBOARD 7.5X6 YLW CONV (MISCELLANEOUS) ×1 IMPLANT
SIGHTPATH CAT PROC W REG LENS (Ophthalmic Related) ×2 IMPLANT
SYRINGE LUER LOK 1CC (MISCELLANEOUS) ×1 IMPLANT
TAPE SURG TRANSPORE 1 IN (GAUZE/BANDAGES/DRESSINGS) IMPLANT
TAPE SURGICAL TRANSPORE 1 IN (GAUZE/BANDAGES/DRESSINGS) ×1
WATER STERILE IRR 250ML POUR (IV SOLUTION) ×1 IMPLANT

## 2016-01-15 NOTE — Anesthesia Postprocedure Evaluation (Signed)
Anesthesia Post Note  Patient: Sabrina Wyatt  Procedure(s) Performed: Procedure(s) (LRB): CATARACT EXTRACTION PHACO AND INTRAOCULAR LENS PLACEMENT RIGHT EYE CDE=5.88 (Right)  Patient location during evaluation: Short Stay Anesthesia Type: MAC Level of consciousness: awake and alert Pain management: pain level controlled Vital Signs Assessment: post-procedure vital signs reviewed and stable Respiratory status: spontaneous breathing Cardiovascular status: blood pressure returned to baseline Postop Assessment: no signs of nausea or vomiting Anesthetic complications: no    Last Vitals:  Filed Vitals:   01/15/16 1240 01/15/16 1245  BP: 120/69 120/67  Pulse:    Temp:    Resp: 18 20    Last Pain:  Filed Vitals:   01/15/16 1245  PainSc: 2                  Elsa Ploch

## 2016-01-15 NOTE — Transfer of Care (Signed)
Immediate Anesthesia Transfer of Care Note  Patient: Sabrina Wyatt  Procedure(s) Performed: Procedure(s): CATARACT EXTRACTION PHACO AND INTRAOCULAR LENS PLACEMENT RIGHT EYE CDE=5.88 (Right)  Patient Location: Short Stay  Anesthesia Type:MAC  Level of Consciousness: awake  Airway & Oxygen Therapy: Patient Spontanous Breathing  Post-op Assessment: Report given to RN  Post vital signs: Reviewed  Last Vitals:  Filed Vitals:   01/15/16 1240 01/15/16 1245  BP: 120/69 120/67  Pulse:    Temp:    Resp: 18 20    Last Pain:  Filed Vitals:   01/15/16 1245  PainSc: 2       Patients Stated Pain Goal: 4 ([id number])  Complications: No apparent anesthesia complications

## 2016-01-15 NOTE — Discharge Instructions (Signed)

## 2016-01-15 NOTE — Anesthesia Preprocedure Evaluation (Signed)
Anesthesia Evaluation  Patient identified by MRN, date of birth, ID band Patient awake    Reviewed: Allergy & Precautions, H&P , NPO status , Patient's Chart, lab work & pertinent test results, reviewed documented beta blocker date and time   Airway Mallampati: II  TM Distance: <3 FB Neck ROM: Full    Dental  (+) Edentulous Upper   Pulmonary shortness of breath, Current Smoker,    breath sounds clear to auscultation       Cardiovascular hypertension, Pt. on medications and Pt. on home beta blockers  Rhythm:Regular Rate:Normal     Neuro/Psych Seizures -,  PSYCHIATRIC DISORDERS (PTSD) Depression  Neuromuscular disease CVA (remote)    GI/Hepatic GERD  Medicated,(+) Cirrhosis     substance abuse  alcohol use, Hepatitis -, C  Endo/Other    Renal/GU      Musculoskeletal   Abdominal   Peds  Hematology   Anesthesia Other Findings   Reproductive/Obstetrics                             Anesthesia Physical Anesthesia Plan  ASA: III  Anesthesia Plan: MAC   Post-op Pain Management:    Induction: Intravenous  Airway Management Planned: Nasal Cannula  Additional Equipment:   Intra-op Plan:   Post-operative Plan:   Informed Consent: I have reviewed the patients History and Physical, chart, labs and discussed the procedure including the risks, benefits and alternatives for the proposed anesthesia with the patient or authorized representative who has indicated his/her understanding and acceptance.     Plan Discussed with:   Anesthesia Plan Comments:         Anesthesia Quick Evaluation

## 2016-01-15 NOTE — Op Note (Signed)
Date of Admission: 01/15/2016  Date of Surgery: 01/15/2016   Pre-Op Dx: Cataract Right Eye  Post-Op Dx: Senile Combined Cataract Right  Eye,  Dx Code [id number]  Surgeon: Tonny Branch, M.D.  Assistants: None  Anesthesia: Topical with MAC  Indications: Painless, progressive loss of vision with compromise of daily activities.  Surgery: Cataract Extraction with Intraocular lens Implant Right Eye  Discription: The patient had dilating drops and viscous lidocaine placed into the Right eye in the pre-op holding area. After transfer to the operating room, a time out was performed. The patient was then prepped and draped. Beginning with a 18 degree blade a paracentesis port was made at the surgeon's 2 o'clock position. The anterior chamber was then filled with 1% non-preserved lidocaine. This was followed by filling the anterior chamber with Provisc.  A 2.65mm keratome blade was used to make a clear corneal incision at the temporal limbus.  A bent cystatome needle was used to create a continuous tear capsulotomy. Hydrodissection was performed with balanced salt solution on a Fine canula. The lens nucleus was then removed using the phacoemulsification handpiece. Residual cortex was removed with the I&A handpiece. The anterior chamber and capsular bag were refilled with Provisc. A posterior chamber intraocular lens was placed into the capsular bag with it's injector. The implant was positioned with the Kuglan hook. The Provisc was then removed from the anterior chamber and capsular bag with the I&A handpiece. Stromal hydration of the main incision and paracentesis port was performed with BSS on a Fine canula. The wounds were tested for leak which was negative. The patient tolerated the procedure well. There were no operative complications. The patient was then transferred to the recovery room in stable condition.  Complications: None  Specimen: None  EBL: None  Prosthetic device: Hoya iSert 250, power 22.0  D, SN [id number].

## 2016-01-15 NOTE — Addendum Note (Signed)
Addendum  created 01/15/16 1313 by Ollen Bowl, CRNA   Modules edited: Anesthesia Responsible Staff

## 2016-01-15 NOTE — H&P (Signed)
I have reviewed the H&P, the patient was re-examined, and I have identified no interval changes in medical condition and plan of care since the history and physical of record  

## 2016-01-16 ENCOUNTER — Encounter (HOSPITAL_COMMUNITY): Payer: Self-pay | Admitting: Ophthalmology

## 2016-01-20 ENCOUNTER — Encounter: Payer: Self-pay | Admitting: Physical Therapy

## 2016-01-21 ENCOUNTER — Ambulatory Visit: Payer: Medicare Other | Admitting: Obstetrics and Gynecology

## 2016-02-04 ENCOUNTER — Other Ambulatory Visit: Payer: Self-pay | Admitting: Advanced Practice Midwife

## 2016-02-04 DIAGNOSIS — A749 Chlamydial infection, unspecified: Secondary | ICD-10-CM | POA: Diagnosis not present

## 2016-02-04 DIAGNOSIS — Z118 Encounter for screening for other infectious and parasitic diseases: Secondary | ICD-10-CM | POA: Diagnosis not present

## 2016-02-04 DIAGNOSIS — A549 Gonococcal infection, unspecified: Secondary | ICD-10-CM | POA: Diagnosis not present

## 2016-02-05 LAB — GC/CHLAMYDIA PROBE AMP
Chlamydia trachomatis, NAA: NEGATIVE
Neisseria gonorrhoeae by PCR: NEGATIVE

## 2016-02-06 DIAGNOSIS — M79671 Pain in right foot: Secondary | ICD-10-CM | POA: Diagnosis not present

## 2016-02-06 DIAGNOSIS — M79672 Pain in left foot: Secondary | ICD-10-CM | POA: Diagnosis not present

## 2016-02-06 DIAGNOSIS — G894 Chronic pain syndrome: Secondary | ICD-10-CM | POA: Diagnosis not present

## 2016-02-06 DIAGNOSIS — M79641 Pain in right hand: Secondary | ICD-10-CM | POA: Diagnosis not present

## 2016-02-06 DIAGNOSIS — Z79891 Long term (current) use of opiate analgesic: Secondary | ICD-10-CM | POA: Diagnosis not present

## 2016-02-06 DIAGNOSIS — M79642 Pain in left hand: Secondary | ICD-10-CM | POA: Diagnosis not present

## 2016-02-09 DIAGNOSIS — H25812 Combined forms of age-related cataract, left eye: Secondary | ICD-10-CM | POA: Diagnosis not present

## 2016-02-11 ENCOUNTER — Encounter (HOSPITAL_COMMUNITY)
Admission: RE | Admit: 2016-02-11 | Discharge: 2016-02-11 | Disposition: A | Discharge status: Home / Self Care | Payer: Medicare Other | Source: Ambulatory Visit | Attending: Ophthalmology | Admitting: Ophthalmology

## 2016-02-11 ENCOUNTER — Encounter (HOSPITAL_COMMUNITY): Payer: Self-pay

## 2016-02-11 MED ORDER — NEOMYCIN-POLYMYXIN-DEXAMETH 3.5-10000-0.1 OP SUSP
OPHTHALMIC | Status: AC
  Filled 2016-02-11: qty 5

## 2016-02-11 MED ORDER — PHENYLEPHRINE HCL 2.5 % OP SOLN
OPHTHALMIC | Status: AC
  Filled 2016-02-11: qty 15

## 2016-02-11 MED ORDER — CYCLOPENTOLATE-PHENYLEPHRINE OP SOLN OPTIME - NO CHARGE
OPHTHALMIC | Status: AC
  Filled 2016-02-11: qty 2

## 2016-02-11 MED ORDER — LIDOCAINE HCL 3.5 % OP GEL
OPHTHALMIC | Status: AC
  Filled 2016-02-11: qty 1

## 2016-02-11 MED ORDER — LIDOCAINE HCL (PF) 1 % IJ SOLN
INTRAMUSCULAR | Status: AC
  Filled 2016-02-11: qty 2

## 2016-02-11 MED ORDER — TETRACAINE HCL 0.5 % OP SOLN
OPHTHALMIC | Status: AC
  Filled 2016-02-11: qty 4

## 2016-02-12 ENCOUNTER — Encounter (HOSPITAL_COMMUNITY): Payer: Self-pay | Admitting: *Deleted

## 2016-02-12 ENCOUNTER — Ambulatory Visit (HOSPITAL_COMMUNITY)
Admission: RE | Admit: 2016-02-12 | Discharge: 2016-02-12 | Disposition: A | Discharge status: Home / Self Care | Payer: Medicare Other | Source: Ambulatory Visit | Attending: Ophthalmology | Admitting: Ophthalmology

## 2016-02-12 ENCOUNTER — Ambulatory Visit (HOSPITAL_COMMUNITY): Payer: Medicare Other | Admitting: Anesthesiology

## 2016-02-12 ENCOUNTER — Encounter (HOSPITAL_COMMUNITY)
Admission: RE | Disposition: A | Discharge status: Home / Self Care | Payer: Self-pay | Source: Ambulatory Visit | Attending: Ophthalmology

## 2016-02-12 DIAGNOSIS — G709 Myoneural disorder, unspecified: Secondary | ICD-10-CM | POA: Insufficient documentation

## 2016-02-12 DIAGNOSIS — R0602 Shortness of breath: Secondary | ICD-10-CM | POA: Diagnosis not present

## 2016-02-12 DIAGNOSIS — F4310 Post-traumatic stress disorder, unspecified: Secondary | ICD-10-CM | POA: Diagnosis not present

## 2016-02-12 DIAGNOSIS — H25812 Combined forms of age-related cataract, left eye: Secondary | ICD-10-CM | POA: Insufficient documentation

## 2016-02-12 DIAGNOSIS — G40909 Epilepsy, unspecified, not intractable, without status epilepticus: Secondary | ICD-10-CM | POA: Insufficient documentation

## 2016-02-12 DIAGNOSIS — F17210 Nicotine dependence, cigarettes, uncomplicated: Secondary | ICD-10-CM | POA: Diagnosis not present

## 2016-02-12 DIAGNOSIS — K219 Gastro-esophageal reflux disease without esophagitis: Secondary | ICD-10-CM | POA: Diagnosis not present

## 2016-02-12 DIAGNOSIS — H269 Unspecified cataract: Secondary | ICD-10-CM | POA: Diagnosis not present

## 2016-02-12 DIAGNOSIS — I10 Essential (primary) hypertension: Secondary | ICD-10-CM | POA: Insufficient documentation

## 2016-02-12 DIAGNOSIS — K7460 Unspecified cirrhosis of liver: Secondary | ICD-10-CM | POA: Insufficient documentation

## 2016-02-12 DIAGNOSIS — Z8673 Personal history of transient ischemic attack (TIA), and cerebral infarction without residual deficits: Secondary | ICD-10-CM | POA: Insufficient documentation

## 2016-02-12 DIAGNOSIS — F329 Major depressive disorder, single episode, unspecified: Secondary | ICD-10-CM | POA: Insufficient documentation

## 2016-02-12 DIAGNOSIS — Z8619 Personal history of other infectious and parasitic diseases: Secondary | ICD-10-CM | POA: Insufficient documentation

## 2016-02-12 HISTORY — PX: CATARACT EXTRACTION W/PHACO: SHX586

## 2016-02-12 SURGERY — PHACOEMULSIFICATION, CATARACT, WITH IOL INSERTION
Anesthesia: Monitor Anesthesia Care | Site: Eye | Laterality: Left

## 2016-02-12 MED ORDER — PROVISC 10 MG/ML IO SOLN
INTRAOCULAR | Status: DC | PRN
  Administered 2016-02-12: 0.85 mL via INTRAOCULAR

## 2016-02-12 MED ORDER — NEOMYCIN-POLYMYXIN-DEXAMETH 3.5-10000-0.1 OP SUSP
OPHTHALMIC | Status: DC | PRN
  Administered 2016-02-12: 2 [drp] via OPHTHALMIC

## 2016-02-12 MED ORDER — MIDAZOLAM HCL 2 MG/2ML IJ SOLN
1.0000 mg | INTRAMUSCULAR | Status: DC | PRN
  Administered 2016-02-12: 2 mg via INTRAVENOUS

## 2016-02-12 MED ORDER — LIDOCAINE HCL (PF) 1 % IJ SOLN
INTRAMUSCULAR | Status: DC | PRN
  Administered 2016-02-12: .4 mL

## 2016-02-12 MED ORDER — FENTANYL CITRATE (PF) 100 MCG/2ML IJ SOLN
25.0000 ug | INTRAMUSCULAR | Status: AC | PRN
  Administered 2016-02-12 (×2): 25 ug via INTRAVENOUS

## 2016-02-12 MED ORDER — EPINEPHRINE HCL 1 MG/ML IJ SOLN
INTRAMUSCULAR | Status: AC
  Filled 2016-02-12: qty 1

## 2016-02-12 MED ORDER — MIDAZOLAM HCL 2 MG/2ML IJ SOLN
INTRAMUSCULAR | Status: AC
  Filled 2016-02-12: qty 2

## 2016-02-12 MED ORDER — POVIDONE-IODINE 5 % OP SOLN
OPHTHALMIC | Status: DC | PRN
  Administered 2016-02-12: 1 via OPHTHALMIC

## 2016-02-12 MED ORDER — TETRACAINE HCL 0.5 % OP SOLN
1.0000 [drp] | OPHTHALMIC | Status: AC
  Administered 2016-02-12 (×3): 1 [drp] via OPHTHALMIC

## 2016-02-12 MED ORDER — PHENYLEPHRINE HCL 2.5 % OP SOLN
1.0000 [drp] | OPHTHALMIC | Status: AC
  Administered 2016-02-12 (×3): 1 [drp] via OPHTHALMIC

## 2016-02-12 MED ORDER — LACTATED RINGERS IV SOLN
INTRAVENOUS | Status: DC
  Administered 2016-02-12: 1000 mL via INTRAVENOUS

## 2016-02-12 MED ORDER — FENTANYL CITRATE (PF) 100 MCG/2ML IJ SOLN
INTRAMUSCULAR | Status: AC
  Filled 2016-02-12: qty 2

## 2016-02-12 MED ORDER — EPINEPHRINE HCL 1 MG/ML IJ SOLN
INTRAOCULAR | Status: DC | PRN
  Administered 2016-02-12: 500 mL

## 2016-02-12 MED ORDER — LIDOCAINE HCL 3.5 % OP GEL
1.0000 "application " | Freq: Once | OPHTHALMIC | Status: AC
  Administered 2016-02-12: 1 via OPHTHALMIC

## 2016-02-12 MED ORDER — BSS IO SOLN
INTRAOCULAR | Status: DC | PRN
  Administered 2016-02-12: 15 mL via INTRAOCULAR

## 2016-02-12 MED ORDER — LIDOCAINE 3.5 % OP GEL OPTIME - NO CHARGE
OPHTHALMIC | Status: DC | PRN
  Administered 2016-02-12: 2 [drp] via OPHTHALMIC

## 2016-02-12 MED ORDER — CYCLOPENTOLATE-PHENYLEPHRINE 0.2-1 % OP SOLN
1.0000 [drp] | OPHTHALMIC | Status: AC
  Administered 2016-02-12 (×3): 1 [drp] via OPHTHALMIC

## 2016-02-12 SURGICAL SUPPLY — 25 items
CAPSULAR TENSION RING-AMO (OPHTHALMIC RELATED) IMPLANT
CLOTH BEACON ORANGE TIMEOUT ST (SAFETY) ×1 IMPLANT
EYE SHIELD UNIVERSAL CLEAR (GAUZE/BANDAGES/DRESSINGS) ×1 IMPLANT
GLOVE BIOGEL PI IND STRL 6.5 (GLOVE) IMPLANT
GLOVE BIOGEL PI IND STRL 7.0 (GLOVE) IMPLANT
GLOVE BIOGEL PI IND STRL 7.5 (GLOVE) IMPLANT
GLOVE BIOGEL PI INDICATOR 6.5 (GLOVE) ×2
GLOVE BIOGEL PI INDICATOR 7.0 (GLOVE)
GLOVE BIOGEL PI INDICATOR 7.5 (GLOVE)
GLOVE EXAM NITRILE LRG STRL (GLOVE) IMPLANT
GLOVE EXAM NITRILE MD LF STRL (GLOVE) IMPLANT
KIT VITRECTOMY (OPHTHALMIC RELATED) IMPLANT
PAD ARMBOARD 7.5X6 YLW CONV (MISCELLANEOUS) ×1 IMPLANT
PROC W NO LENS (INTRAOCULAR LENS)
PROC W SPEC LENS (INTRAOCULAR LENS)
PROCESS W NO LENS (INTRAOCULAR LENS) IMPLANT
PROCESS W SPEC LENS (INTRAOCULAR LENS) IMPLANT
RETRACTOR IRIS SIGHTPATH (OPHTHALMIC RELATED) IMPLANT
RING MALYGIN (MISCELLANEOUS) IMPLANT
SIGHTPATH CAT PROC W REG LENS (Ophthalmic Related) ×2 IMPLANT
SYRINGE LUER LOK 1CC (MISCELLANEOUS) ×1 IMPLANT
TAPE SURG TRANSPORE 1 IN (GAUZE/BANDAGES/DRESSINGS) IMPLANT
TAPE SURGICAL TRANSPORE 1 IN (GAUZE/BANDAGES/DRESSINGS) ×1
VISCOELASTIC ADDITIONAL (OPHTHALMIC RELATED) IMPLANT
WATER STERILE IRR 250ML POUR (IV SOLUTION) ×1 IMPLANT

## 2016-02-12 NOTE — Anesthesia Preprocedure Evaluation (Signed)
Anesthesia Evaluation  Patient identified by MRN, date of birth, ID band Patient awake    Reviewed: Allergy & Precautions, H&P , NPO status , Patient's Chart, lab work & pertinent test results, reviewed documented beta blocker date and time   Airway Mallampati: II  TM Distance: <3 FB Neck ROM: Full    Dental  (+) Edentulous Upper   Pulmonary shortness of breath, Current Smoker,    breath sounds clear to auscultation       Cardiovascular hypertension, Pt. on medications and Pt. on home beta blockers  Rhythm:Regular Rate:Normal     Neuro/Psych Seizures -,  PSYCHIATRIC DISORDERS (PTSD) Depression  Neuromuscular disease CVA (remote)    GI/Hepatic GERD  Medicated,(+) Cirrhosis     substance abuse  alcohol use, Hepatitis -, C  Endo/Other    Renal/GU      Musculoskeletal   Abdominal   Peds  Hematology   Anesthesia Other Findings   Reproductive/Obstetrics                             Anesthesia Physical Anesthesia Plan  ASA: III  Anesthesia Plan: MAC   Post-op Pain Management:    Induction: Intravenous  Airway Management Planned: Nasal Cannula  Additional Equipment:   Intra-op Plan:   Post-operative Plan:   Informed Consent: I have reviewed the patients History and Physical, chart, labs and discussed the procedure including the risks, benefits and alternatives for the proposed anesthesia with the patient or authorized representative who has indicated his/her understanding and acceptance.     Plan Discussed with:   Anesthesia Plan Comments:         Anesthesia Quick Evaluation

## 2016-02-12 NOTE — Anesthesia Postprocedure Evaluation (Signed)
Anesthesia Post Note  Patient: Sabrina Wyatt  Procedure(s) Performed: Procedure(s) (LRB): CATARACT EXTRACTION PHACO AND INTRAOCULAR LENS PLACEMENT LEFT EYE; CDE:  6.02 (Left)  Patient location during evaluation: Short Stay Anesthesia Type: MAC Level of consciousness: awake and alert and oriented Pain management: pain level controlled Vital Signs Assessment: post-procedure vital signs reviewed and stable Respiratory status: spontaneous breathing and respiratory function stable Cardiovascular status: stable Postop Assessment: no signs of nausea or vomiting Anesthetic complications: no    Last Vitals:  Filed Vitals:   02/12/16 0835 02/12/16 0840  BP: 111/69 98/60  Temp:    Resp: 28 20    Last Pain: There were no vitals filed for this visit.               ADAMS, AMY A

## 2016-02-12 NOTE — Anesthesia Procedure Notes (Signed)
Procedure Name: MAC Date/Time: 02/12/2016 8:47 AM Performed by: Andree Elk, Ellamarie Naeve A Pre-anesthesia Checklist: Timeout performed, Patient identified, Emergency Drugs available, Suction available and Patient being monitored Oxygen Delivery Method: Nasal cannula

## 2016-02-12 NOTE — H&P (Signed)
I have reviewed the H&P, the patient was re-examined, and I have identified no interval changes in medical condition and plan of care since the history and physical of record  

## 2016-02-12 NOTE — Op Note (Signed)
Date of Admission: 02/12/2016  Date of Surgery: 02/12/2016   Pre-Op Dx: Cataract Left Eye  Post-Op Dx: Senile combined Cataract Left  Eye,  Dx Code [id number]  Surgeon: Tonny Branch, M.D.  Assistants: None  Anesthesia: Topical with MAC  Indications: Painless, progressive loss of vision with compromise of daily activities.  Surgery: Cataract Extraction with Intraocular lens Implant Left Eye  Discription: The patient had dilating drops and viscous lidocaine placed into the Left eye in the pre-op holding area. After transfer to the operating room, a time out was performed. The patient was then prepped and draped. Beginning with a 7 degree blade a paracentesis port was made at the surgeon's 2 o'clock position. The anterior chamber was then filled with 1% non-preserved lidocaine. This was followed by filling the anterior chamber with Provisc.  A 2.21mm keratome blade was used to make a clear corneal incision at the temporal limbus.  A bent cystatome needle was used to create a continuous tear capsulotomy. Hydrodissection was performed with balanced salt solution on a Fine canula. The lens nucleus was then removed using the phacoemulsification handpiece. Residual cortex was removed with the I&A handpiece. The anterior chamber and capsular bag were refilled with Provisc. A posterior chamber intraocular lens was placed into the capsular bag with it's injector. The implant was positioned with the Kuglan hook. The Provisc was then removed from the anterior chamber and capsular bag with the I&A handpiece. Stromal hydration of the main incision and paracentesis port was performed with BSS on a Fine canula. The wounds were tested for leak which was negative. The patient tolerated the procedure well. There were no operative complications. The patient was then transferred to the recovery room in stable condition.  Complications: None  Specimen: None  EBL: None  Prosthetic device: Hoya iSert 250, power 23.5 D, SN  [id number].

## 2016-02-12 NOTE — Transfer of Care (Signed)
Immediate Anesthesia Transfer of Care Note  Patient: Sabrina Wyatt  Procedure(s) Performed: Procedure(s): CATARACT EXTRACTION PHACO AND INTRAOCULAR LENS PLACEMENT LEFT EYE; CDE:  6.02 (Left)  Patient Location: Short Stay  Anesthesia Type:MAC  Level of Consciousness: awake, alert , oriented and patient cooperative  Airway & Oxygen Therapy: Patient Spontanous Breathing  Post-op Assessment: Report given to RN and Post -op Vital signs reviewed and stable  Post vital signs: Reviewed and stable  Last Vitals:  Filed Vitals:   02/12/16 0835 02/12/16 0840  BP: 111/69 98/60  Temp:    Resp: 28 20    Last Pain: There were no vitals filed for this visit.    Patients Stated Pain Goal: 7 ([id number] [phone])  Complications: No apparent anesthesia complications

## 2016-02-13 ENCOUNTER — Encounter (HOSPITAL_COMMUNITY): Payer: Self-pay | Admitting: Ophthalmology

## 2016-03-01 ENCOUNTER — Other Ambulatory Visit: Payer: Self-pay

## 2016-03-01 ENCOUNTER — Telehealth: Payer: Self-pay | Admitting: Internal Medicine

## 2016-03-01 DIAGNOSIS — K7460 Unspecified cirrhosis of liver: Principal | ICD-10-CM

## 2016-03-01 DIAGNOSIS — B182 Chronic viral hepatitis C: Secondary | ICD-10-CM

## 2016-03-01 NOTE — Telephone Encounter (Signed)
PATIENT ON RECALL FOR ULTRASOUND  °

## 2016-03-01 NOTE — Telephone Encounter (Signed)
Letter mailed with appointment and she aware

## 2016-03-05 DIAGNOSIS — M542 Cervicalgia: Secondary | ICD-10-CM | POA: Diagnosis not present

## 2016-03-05 DIAGNOSIS — Z79891 Long term (current) use of opiate analgesic: Secondary | ICD-10-CM | POA: Diagnosis not present

## 2016-03-05 DIAGNOSIS — M545 Low back pain: Secondary | ICD-10-CM | POA: Diagnosis not present

## 2016-03-05 DIAGNOSIS — G894 Chronic pain syndrome: Secondary | ICD-10-CM | POA: Diagnosis not present

## 2016-03-09 ENCOUNTER — Ambulatory Visit (HOSPITAL_COMMUNITY)
Admission: RE | Admit: 2016-03-09 | Discharge: 2016-03-09 | Disposition: A | Discharge status: Home / Self Care | Payer: Medicare Other | Source: Ambulatory Visit | Attending: Gastroenterology | Admitting: Gastroenterology

## 2016-03-09 DIAGNOSIS — B182 Chronic viral hepatitis C: Secondary | ICD-10-CM | POA: Insufficient documentation

## 2016-03-09 DIAGNOSIS — K7460 Unspecified cirrhosis of liver: Secondary | ICD-10-CM | POA: Diagnosis not present

## 2016-03-12 DIAGNOSIS — M5417 Radiculopathy, lumbosacral region: Secondary | ICD-10-CM | POA: Diagnosis not present

## 2016-03-12 DIAGNOSIS — M25552 Pain in left hip: Secondary | ICD-10-CM | POA: Diagnosis not present

## 2016-03-12 DIAGNOSIS — M79651 Pain in right thigh: Secondary | ICD-10-CM | POA: Diagnosis not present

## 2016-03-12 DIAGNOSIS — G894 Chronic pain syndrome: Secondary | ICD-10-CM | POA: Diagnosis not present

## 2016-03-12 DIAGNOSIS — Z79891 Long term (current) use of opiate analgesic: Secondary | ICD-10-CM | POA: Diagnosis not present

## 2016-03-12 DIAGNOSIS — M25551 Pain in right hip: Secondary | ICD-10-CM | POA: Diagnosis not present

## 2016-03-15 ENCOUNTER — Encounter: Payer: Self-pay | Admitting: Internal Medicine

## 2016-03-15 NOTE — Progress Notes (Signed)
U/s stable. Needs OV with RMR only

## 2016-03-15 NOTE — Progress Notes (Signed)
APPT MADE AND LETTER SENT  °

## 2016-03-18 DIAGNOSIS — Z79899 Other long term (current) drug therapy: Secondary | ICD-10-CM | POA: Diagnosis not present

## 2016-03-18 DIAGNOSIS — Z5181 Encounter for therapeutic drug level monitoring: Secondary | ICD-10-CM | POA: Diagnosis not present

## 2016-03-29 NOTE — Progress Notes (Signed)
Can NIC for ruq u/s for cirrhosis in 08/2016.

## 2016-04-07 ENCOUNTER — Ambulatory Visit: Payer: Medicare Other | Admitting: Obstetrics and Gynecology

## 2016-04-07 DIAGNOSIS — Z79891 Long term (current) use of opiate analgesic: Secondary | ICD-10-CM | POA: Diagnosis not present

## 2016-04-07 DIAGNOSIS — M5416 Radiculopathy, lumbar region: Secondary | ICD-10-CM | POA: Diagnosis not present

## 2016-04-09 DIAGNOSIS — M25551 Pain in right hip: Secondary | ICD-10-CM | POA: Diagnosis not present

## 2016-04-09 DIAGNOSIS — M25552 Pain in left hip: Secondary | ICD-10-CM | POA: Diagnosis not present

## 2016-04-09 DIAGNOSIS — M5416 Radiculopathy, lumbar region: Secondary | ICD-10-CM | POA: Diagnosis not present

## 2016-04-09 DIAGNOSIS — G894 Chronic pain syndrome: Secondary | ICD-10-CM | POA: Diagnosis not present

## 2016-04-09 DIAGNOSIS — Z79891 Long term (current) use of opiate analgesic: Secondary | ICD-10-CM | POA: Diagnosis not present

## 2016-04-09 DIAGNOSIS — M5417 Radiculopathy, lumbosacral region: Secondary | ICD-10-CM | POA: Diagnosis not present

## 2016-04-09 DIAGNOSIS — M79651 Pain in right thigh: Secondary | ICD-10-CM | POA: Diagnosis not present

## 2016-04-13 ENCOUNTER — Ambulatory Visit (INDEPENDENT_AMBULATORY_CARE_PROVIDER_SITE_OTHER): Payer: Medicare Other | Admitting: Obstetrics and Gynecology

## 2016-04-13 ENCOUNTER — Encounter: Payer: Self-pay | Admitting: Obstetrics and Gynecology

## 2016-04-13 VITALS — BP 140/88 | HR 67 | Ht 65.0 in | Wt 148.2 lb

## 2016-04-13 DIAGNOSIS — G894 Chronic pain syndrome: Secondary | ICD-10-CM | POA: Diagnosis not present

## 2016-04-13 DIAGNOSIS — R102 Pelvic and perineal pain: Secondary | ICD-10-CM

## 2016-04-13 MED ORDER — TRAMADOL HCL 50 MG PO TABS: 50.0000 mg | ORAL_TABLET | Freq: Four times a day (QID) | ORAL | 0 refills | Status: DC | PRN

## 2016-04-13 NOTE — Progress Notes (Addendum)
[facility] Visit  @DATE @            Patient name: Sabrina Wyatt MRN [id number]  Date of birth: 11-18-1950  CC & HPI:  Sabrina Wyatt is a 65 y.o. female presenting today for Follow-up of vulvar folliculitis, and pain med requests Patient is in transition from [facility] clinic where she was supposedly stopped as of last week and is to be seen by a Dr. Bailey Mech at the [facility] in [place] as a next Monday for pain management issues She is requesting some help with moderate pain to get her through until next week because she was cut back more than she can tolerate by the headache clinic ROS:  ROS   Pertinent History Reviewed:   Reviewed: Significant for  Medical         Past Medical History:  Diagnosis Date  . Abdominal wall pain    chronic  . Arthritis    rheumatoid  . Biliary stone   . Carpal tunnel syndrome   . Chronic abdominal pain   . Chronic flank pain   . Chronic low back pain   . Cirrhosis, hepatitis C    CT on 04/21/15= no HCC, pt has been vaccinated for Hep A and Hep B. s/p treatment of HCV with eradication  . Depression   . GERD (gastroesophageal reflux disease)   . Gonorrhea   . Hypertension   . 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 Intussusception ([place]) 04/2012  . Polysubstance abuse    HX of  . PTSD (post-traumatic stress disorder)   . Pulmonary nodules   . S/P colonoscopy 2005   Dr Moss Mc polyp removed, otherwise normal  . S/P endoscopy 08/27/10   antral erosions, otherwise normal, due egd 08/2012 to screen for varices  . Seizures ([place]) 1998   with brain bleed x1. None since then and on no meds  . Shortness of breath   . Tobacco abuse                               Surgical Hx:    Past Surgical History:  Procedure Laterality Date  . biliary stone removal  2015  . BIOPSY N/A 03/06/2015   Procedure: BIOPSY;  Surgeon: Daneil Dolin, MD;  Location: [facility];  Service: Endoscopy;  Laterality: N/A;  . CARPAL TUNNEL  RELEASE Bilateral 12/2010  . CATARACT EXTRACTION W/PHACO Right 01/15/2016   Procedure: CATARACT EXTRACTION PHACO AND INTRAOCULAR LENS PLACEMENT RIGHT EYE CDE=5.88;  Surgeon: Tonny Branch, MD;  Location: [facility];  Service: Ophthalmology;  Laterality: Right;  . CATARACT EXTRACTION W/PHACO Left 02/12/2016   Procedure: CATARACT EXTRACTION PHACO AND INTRAOCULAR LENS PLACEMENT LEFT EYE; CDE:  6.02;  Surgeon: Tonny Branch, MD;  Location: [facility];  Service: Ophthalmology;  Laterality: Left;  . CHOLECYSTECTOMY  2002  . COLONOSCOPY    09/10/2003   diminutive polyp in the rectum cold biopsied/removed/ Normal colon  . COLONOSCOPY WITH PROPOFOL N/A 08/02/2013   [id number] diverticulosis. next TCS 10 years.  . ESOPHAGOGASTRODUODENOSCOPY    09/10/2003   Small hiatal hernia; otherwise normal stomach, normal D1 and D2  . ESOPHAGOGASTRODUODENOSCOPY  08/27/2010   [id number] esophagus.  No varices.  Couple of tiny antral erosions of doubtful clinical significance, otherwise normal stomach, D1 and D2.  . ESOPHAGOGASTRODUODENOSCOPY (EGD) WITH PROPOFOL N/A 08/02/2013   RMR: Portal gastropathy. No explanation for abdominal pain  which I believe is more abdominal wall in origin. She has been seen by Dr. Carlis Abbott  over at [facility]. She is up-to-date on cross sectional imaging. She has a pain management physician in [place]. Repeat EGD for varices in 3 years.  . ESOPHAGOGASTRODUODENOSCOPY (EGD) WITH PROPOFOL N/A 03/06/2015   [id number] gastric mucosac s/p bx  . LUMBAR FUSION    . NOSE SURGERY  1970  . PERCUTANEOUS TRANSHEPATIC CHOLANGIOGRAHPY AND BILLIARY DRAINAGE  2002  . REPAIR VAGINAL CUFF N/A 10/25/2015   Procedure: REPAIR VAGINAL LACERATION ;  Surgeon: Jonnie Kind, MD;  Location: [facility];  Service: Gynecology;  Laterality: N/A;  . Roux-en-y-hepatojejunostomy  2003   revision in 2013 for intussusception [facility] Dr. Bailey Mech)  . SPINAL FUSION     C5-C7   Medications: Reviewed & Updated - see associated section                        Current Outpatient Prescriptions:  .  acyclovir (ZOVIRAX) 400 MG tablet, TAKE AS DIRECTED. (Patient taking differently: TAKE ONE TABLET BY MOUTH TWICE DAILY), Disp: 40 tablet, Rfl: 11 .  albuterol (PROVENTIL) (2.5 MG/3ML) 0.083% nebulizer solution, Take 2.5 mg by nebulization every 6 (six) hours as needed for wheezing or shortness of breath. , Disp: , Rfl:  .  ALPRAZolam (XANAX) 0.5 MG tablet, Take 0.5 mg by mouth at bedtime., Disp: , Rfl:  .  atenolol (TENORMIN) 25 MG tablet, Take 25 mg by mouth every morning. , Disp: , Rfl:  .  Calcium-Magnesium-Vitamin D (CALCIUM 500 PO), Take 1 tablet by mouth daily., Disp: , Rfl:  .  Cholecalciferol (VITAMIN D-3) 1000 UNITS CAPS, Take 1,000 capsules by mouth daily., Disp: , Rfl:  .  Cyanocobalamin (VITAMIN B 12 PO), Take 1 tablet by mouth daily., Disp: , Rfl:  .  DULoxetine (CYMBALTA) 60 MG capsule, Take 60 mg by mouth 2 (two) times daily. , Disp: , Rfl:  .  EPIPEN 2-PAK 0.3 MG/0.3ML SOAJ injection, Inject 0.3 mg into the skin as needed (allergic reactions). , Disp: , Rfl:  .  etodolac (LODINE) 400 MG tablet, Take 400 mg by mouth 2 (two) times daily. , Disp: , Rfl:  .  ferrous sulfate 325 (65 FE) MG tablet, Take 325 mg by mouth daily with breakfast., Disp: , Rfl:  .  Flaxseed, Linseed, (FLAX SEEDS PO), Take 1 tablet by mouth daily., Disp: , Rfl:  .  fluticasone (FLONASE) 50 MCG/ACT nasal spray, Place 2 sprays into the nose daily.  , Disp: , Rfl:  .  hydrOXYzine (VISTARIL) 50 MG capsule, Take 100 mg by mouth daily., Disp: , Rfl:  .  levocetirizine (XYZAL) 5 MG tablet, Take 5 mg by mouth daily., Disp: , Rfl:  .  Omega-3 Fatty Acids (FISH OIL PO), Take 2 capsules by mouth daily. Reported on 09/26/2015, Disp: , Rfl:  .  omeprazole (PRILOSEC) 20 MG capsule, TAKE ONE CAPSULE BY MOUTH EVERY DAY, Disp: 30 capsule, Rfl: 11 .  Oxycodone HCl 10 MG TABS, Take 1 tablet (10 mg total) by mouth 3 (three) times daily as needed. (Patient taking differently:  Take 10 mg by mouth 3 (three) times daily. ), Disp: 30 tablet, Rfl: 0 .  PROAIR HFA 108 (90 BASE) MCG/ACT inhaler, Inhale 2 puffs into the lungs every 6 (six) hours as needed for wheezing or shortness of breath. , Disp: , Rfl:  .  pyridOXINE (VITAMIN B-6) 100 MG tablet, Take 100 mg by mouth daily.,  Disp: , Rfl:  .  tiZANidine (ZANAFLEX) 4 MG tablet, Take 4 mg by mouth 2 (two) times daily as needed for muscle spasms. , Disp: , Rfl:  .  traMADol (ULTRAM) 50 MG tablet, Take 1 tablet (50 mg total) by mouth every 6 (six) hours as needed for moderate pain., Disp: 30 tablet, Rfl: 0 .  traZODone (DESYREL) 50 MG tablet, Take 2 tablets by mouth at bedtime as needed for sleep. , Disp: , Rfl:  .  ESTRACE VAGINAL 0.1 MG/GM vaginal cream, PLACE 1GM VAGINALLY AT BEDTIME. (Patient not taking: Reported on 04/13/2016), Disp: 42.5 g, Rfl: 4   Social History: Reviewed -  reports that she has been smoking Cigarettes.  She has a 8.25 pack-year smoking history. She has never used smokeless tobacco.  Objective Findings:  Vitals: Blood pressure 140/88, pulse 67, height 5\' 5"  (1.651 m), weight 148 lb 3.2 oz (67.2 kg).  Physical Examination: General appearance - alert, well appearing, and in no distress, oriented to person, place, and time, normal appearing weight and anxious Mental status - alert, oriented to person, place, and time, normal mood, behavior, speech, dress, motor activity, and thought processes, affect appropriate to mood Chest - clear to auscultation, no wheezes, rales or rhonchi, symmetric air entry Abdomen - soft, nontender, nondistended, no masses or organomegaly scars from previous incisions multiple abdominal surgical scars including Roux-en-Y surgical procedure for complication of gallbladder surgery, likely a common bile duct injury Pelvic -  VULVA: normal appearing vulva with no masses, tenderness or lesions, well-healed scar from the cautery previous chronic folliculitis. The skin is so normal,;  interesting that the patient wanted this area checked, makes me really raise the question as to whether this is a drug-seeking visit  , VAGINA: normal appearing vagina with normal color and discharge, no lesions, CERVIX: , UTERUS: , ADNEXA: ,  Skin - normal coloration and turgor, no rashes, no suspicious skin lesions noted Extensive actinic keratoses both extremities and abdomen   Assessment & Plan:   A:  1. Well healed folliculitis of the vulva 2. Alleged pain med needs due to window between pain clinic dismissal/discontination and new pain clinic restart 3. Status post vulvar post coital laceration, well-healed in stable  P:  1. We'll presume patient is telling the truth and will give 6 days' worth of tramadol allow her to get to her pain clinic visit 2. No further evaluation of vulvar folliculitis. Acute exacerbations will be seen as needed  3. Rx tramadol 50 mg daily 6 hours #36 tablets to last week til appt at next pain clinic.

## 2016-04-19 DIAGNOSIS — S3613XS Injury of bile duct, sequela: Secondary | ICD-10-CM | POA: Diagnosis not present

## 2016-04-19 DIAGNOSIS — K7460 Unspecified cirrhosis of liver: Secondary | ICD-10-CM | POA: Diagnosis not present

## 2016-04-19 DIAGNOSIS — G894 Chronic pain syndrome: Secondary | ICD-10-CM | POA: Diagnosis not present

## 2016-04-23 ENCOUNTER — Ambulatory Visit: Payer: Self-pay | Admitting: Internal Medicine

## 2016-05-05 ENCOUNTER — Emergency Department (HOSPITAL_COMMUNITY)
Admission: EM | Admit: 2016-05-05 | Discharge: 2016-05-05 | Disposition: A | Discharge status: Home / Self Care | Payer: Medicare Other | Attending: Dermatology | Admitting: Dermatology

## 2016-05-05 ENCOUNTER — Encounter (HOSPITAL_COMMUNITY): Payer: Self-pay | Admitting: Emergency Medicine

## 2016-05-05 DIAGNOSIS — Z79899 Other long term (current) drug therapy: Secondary | ICD-10-CM | POA: Insufficient documentation

## 2016-05-05 DIAGNOSIS — F17210 Nicotine dependence, cigarettes, uncomplicated: Secondary | ICD-10-CM | POA: Diagnosis not present

## 2016-05-05 DIAGNOSIS — Z5321 Procedure and treatment not carried out due to patient leaving prior to being seen by health care provider: Secondary | ICD-10-CM | POA: Insufficient documentation

## 2016-05-05 DIAGNOSIS — Y939 Activity, unspecified: Secondary | ICD-10-CM | POA: Diagnosis not present

## 2016-05-05 DIAGNOSIS — Y929 Unspecified place or not applicable: Secondary | ICD-10-CM | POA: Insufficient documentation

## 2016-05-05 DIAGNOSIS — Y999 Unspecified external cause status: Secondary | ICD-10-CM | POA: Diagnosis not present

## 2016-05-05 DIAGNOSIS — Z043 Encounter for examination and observation following other accident: Secondary | ICD-10-CM | POA: Insufficient documentation

## 2016-05-05 DIAGNOSIS — I10 Essential (primary) hypertension: Secondary | ICD-10-CM | POA: Insufficient documentation

## 2016-05-05 NOTE — ED Notes (Signed)
Unable to locate pt in waiting areas.  [employer] notified and is looking for pt
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
## 2016-05-05 NOTE — ED Notes (Signed)
Pt not in waiting areas

## 2016-05-14 ENCOUNTER — Other Ambulatory Visit: Payer: Self-pay

## 2016-05-14 ENCOUNTER — Ambulatory Visit (INDEPENDENT_AMBULATORY_CARE_PROVIDER_SITE_OTHER): Payer: Medicare Other | Admitting: Internal Medicine

## 2016-05-14 ENCOUNTER — Encounter: Payer: Self-pay | Admitting: Internal Medicine

## 2016-05-14 VITALS — BP 120/82 | HR 78 | Temp 97.7°F | Ht 65.0 in | Wt 143.4 lb

## 2016-05-14 DIAGNOSIS — K7030 Alcoholic cirrhosis of liver without ascites: Secondary | ICD-10-CM

## 2016-05-14 DIAGNOSIS — K219 Gastro-esophageal reflux disease without esophagitis: Secondary | ICD-10-CM

## 2016-05-14 DIAGNOSIS — R1011 Right upper quadrant pain: Secondary | ICD-10-CM

## 2016-05-14 NOTE — Patient Instructions (Signed)
Schedule a CT of abdomen and pelvis with IV and oral contrast to assess RUQ abdominal pain  Seek out evaluation of non GI complaints as recommended by Dr. Legrand Rams  Further recommendations to follow
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
## 2016-05-18 ENCOUNTER — Other Ambulatory Visit: Payer: Self-pay | Admitting: Obstetrics and Gynecology

## 2016-05-31 ENCOUNTER — Ambulatory Visit (INDEPENDENT_AMBULATORY_CARE_PROVIDER_SITE_OTHER): Payer: Medicare Other | Admitting: Obstetrics and Gynecology

## 2016-05-31 ENCOUNTER — Encounter: Payer: Self-pay | Admitting: Obstetrics and Gynecology

## 2016-05-31 VITALS — BP 138/72 | HR 86 | Ht 65.0 in | Wt 138.2 lb

## 2016-05-31 DIAGNOSIS — R319 Hematuria, unspecified: Secondary | ICD-10-CM

## 2016-05-31 DIAGNOSIS — Z113 Encounter for screening for infections with a predominantly sexual mode of transmission: Secondary | ICD-10-CM | POA: Diagnosis not present

## 2016-05-31 DIAGNOSIS — N3001 Acute cystitis with hematuria: Secondary | ICD-10-CM | POA: Diagnosis not present

## 2016-05-31 DIAGNOSIS — R300 Dysuria: Secondary | ICD-10-CM

## 2016-05-31 LAB — POCT URINALYSIS DIPSTICK
Blood, UA: 1
Glucose, UA: NEGATIVE
Ketones, UA: NEGATIVE
Leukocytes, UA: NEGATIVE
Nitrite, UA: NEGATIVE
Protein, UA: 1

## 2016-05-31 MED ORDER — SULFAMETHOXAZOLE-TRIMETHOPRIM 400-80 MG PO TABS: 1.0000 | ORAL_TABLET | Freq: Two times a day (BID) | ORAL | 0 refills | Status: DC

## 2016-05-31 NOTE — Progress Notes (Signed)
[facility] Visit  05/31/16            Patient name: Sabrina Wyatt MRN [id number]  Date of birth: 1951/01/03  CC & HPI:  Sabrina Wyatt is a 65 y.o. female presenting today for intermittent, unchanged dysuria. She also is requesting STD screening today.   ROS:  ROS +dysuria Otherwise negative  Pertinent History Reviewed:   Reviewed: Significant for  Medical         Past Medical History:  Diagnosis Date  . Abdominal wall pain    chronic  . Arthritis    rheumatoid  . Biliary stone   . Carpal tunnel syndrome   . Chronic abdominal pain   . Chronic flank pain   . Chronic low back pain   . Cirrhosis, hepatitis C    U/S on 03/09/16= no HCC, pt has been vaccinated for Hep A and Hep B. s/p treatment of HCV with eradication  . Depression   . GERD (gastroesophageal reflux disease)   . Gonorrhea   . Hypertension   . Intracranial hemorrhage ([place]) 1998   left thalmic  . Intussusception ([place]) 04/2012  . Polysubstance abuse    HX of  . PTSD (post-traumatic stress disorder)   . Pulmonary nodules   . S/P colonoscopy 2005   Dr Moss Mc polyp removed, otherwise normal  . S/P endoscopy 08/27/10   antral erosions, otherwise normal, due egd 08/2012 to screen for varices  . Seizures ([place]) 1998   with brain bleed x1. None since then and on no meds  . Shortness of breath   . Tobacco abuse                               Surgical Hx:    Past Surgical History:  Procedure Laterality Date  . biliary stone removal  2015  . BIOPSY N/A 03/06/2015   Procedure: BIOPSY;  Surgeon: Daneil Dolin, MD;  Location: [facility];  Service: Endoscopy;  Laterality: N/A;  . CARPAL TUNNEL RELEASE Bilateral 12/2010  . CATARACT EXTRACTION W/PHACO Right 01/15/2016   Procedure: CATARACT EXTRACTION PHACO AND INTRAOCULAR LENS PLACEMENT RIGHT EYE CDE=5.88;  Surgeon: Tonny Branch, MD;  Location: [facility];  Service: Ophthalmology;  Laterality: Right;  . CATARACT EXTRACTION W/PHACO Left 02/12/2016   Procedure: CATARACT  EXTRACTION PHACO AND INTRAOCULAR LENS PLACEMENT LEFT EYE; CDE:  6.02;  Surgeon: Tonny Branch, MD;  Location: [facility];  Service: Ophthalmology;  Laterality: Left;  . CHOLECYSTECTOMY  2002  . COLONOSCOPY    09/10/2003   diminutive polyp in the rectum cold biopsied/removed/ Normal colon  . COLONOSCOPY WITH PROPOFOL N/A 08/02/2013   [id number] diverticulosis. next TCS 10 years.  . ESOPHAGOGASTRODUODENOSCOPY    09/10/2003   Small hiatal hernia; otherwise normal stomach, normal D1 and D2  . ESOPHAGOGASTRODUODENOSCOPY  08/27/2010   [id number] esophagus.  No varices.  Couple of tiny antral erosions of doubtful clinical significance, otherwise normal stomach, D1 and D2.  . ESOPHAGOGASTRODUODENOSCOPY (EGD) WITH PROPOFOL N/A 08/02/2013   RMR: Portal gastropathy. No explanation for abdominal pain which I believe is more abdominal wall in origin. She has been seen by Dr. Carlis Abbott  over at [facility]. She is up-to-date on cross sectional imaging. She has a pain management physician in [place]. Repeat EGD for varices in 3 years.  . ESOPHAGOGASTRODUODENOSCOPY (EGD) WITH PROPOFOL N/A 03/06/2015   [id number] gastric mucosac s/p bx  . LUMBAR FUSION    .  NOSE SURGERY  1970  . PERCUTANEOUS TRANSHEPATIC CHOLANGIOGRAHPY AND BILLIARY DRAINAGE  2002  . REPAIR VAGINAL CUFF N/A 10/25/2015   Procedure: REPAIR VAGINAL LACERATION ;  Surgeon: Jonnie Kind, MD;  Location: [facility];  Service: Gynecology;  Laterality: N/A;  . Roux-en-y-hepatojejunostomy  2003   revision in 2013 for intussusception [facility] Dr. Bailey Mech)  . SPINAL FUSION     C5-C7   Medications: Reviewed & Updated - see associated section                       Current Outpatient Prescriptions:  .  acyclovir (ZOVIRAX) 400 MG tablet, TAKE AS DIRECTED., Disp: 40 tablet, Rfl: 0 .  albuterol (PROVENTIL) (2.5 MG/3ML) 0.083% nebulizer solution, Take 2.5 mg by nebulization every 6 (six) hours as needed for wheezing or shortness of breath. , Disp: , Rfl:  .   ALPRAZolam (XANAX) 0.5 MG tablet, Take 0.5 mg by mouth at bedtime., Disp: , Rfl:  .  atenolol (TENORMIN) 25 MG tablet, Take 25 mg by mouth every morning. , Disp: , Rfl:  .  Brexpiprazole (REXULTI) 1 MG TABS, 1 mg daily., Disp: , Rfl:  .  Calcium-Magnesium-Vitamin D (CALCIUM 500 PO), Take 1 tablet by mouth daily., Disp: , Rfl:  .  Cholecalciferol (VITAMIN D-3) 1000 UNITS CAPS, Take 1,000 capsules by mouth daily., Disp: , Rfl:  .  Cyanocobalamin (VITAMIN B 12 PO), Take 1 tablet by mouth daily., Disp: , Rfl:  .  DULoxetine (CYMBALTA) 60 MG capsule, Take 60 mg by mouth 2 (two) times daily. , Disp: , Rfl:  .  EPIPEN 2-PAK 0.3 MG/0.3ML SOAJ injection, Inject 0.3 mg into the skin as needed (allergic reactions). , Disp: , Rfl:  .  etodolac (LODINE) 400 MG tablet, Take 400 mg by mouth 2 (two) times daily. , Disp: , Rfl:  .  ferrous sulfate 325 (65 FE) MG tablet, Take 325 mg by mouth daily with breakfast., Disp: , Rfl:  .  Flaxseed, Linseed, (FLAX SEEDS PO), Take 1 tablet by mouth daily., Disp: , Rfl:  .  fluticasone (FLONASE) 50 MCG/ACT nasal spray, Place 2 sprays into the nose daily.  , Disp: , Rfl:  .  hydrOXYzine (VISTARIL) 50 MG capsule, Take 100 mg by mouth daily., Disp: , Rfl:  .  levocetirizine (XYZAL) 5 MG tablet, Take 5 mg by mouth daily., Disp: , Rfl:  .  Omega-3 Fatty Acids (FISH OIL PO), Take 2 capsules by mouth daily. Reported on 09/26/2015, Disp: , Rfl:  .  omeprazole (PRILOSEC) 20 MG capsule, TAKE ONE CAPSULE BY MOUTH EVERY DAY, Disp: 30 capsule, Rfl: 11 .  Oxycodone HCl 10 MG TABS, Take 1 tablet (10 mg total) by mouth 3 (three) times daily as needed. (Patient taking differently: Take 10 mg by mouth 3 (three) times daily. ), Disp: 30 tablet, Rfl: 0 .  PROAIR HFA 108 (90 BASE) MCG/ACT inhaler, Inhale 2 puffs into the lungs every 6 (six) hours as needed for wheezing or shortness of breath. , Disp: , Rfl:  .  pyridOXINE (VITAMIN B-6) 100 MG tablet, Take 100 mg by mouth daily., Disp: , Rfl:  .   tiZANidine (ZANAFLEX) 4 MG tablet, Take 4 mg by mouth 2 (two) times daily as needed for muscle spasms. , Disp: , Rfl:  .  traMADol (ULTRAM) 50 MG tablet, Take 1 tablet (50 mg total) by mouth every 6 (six) hours as needed for moderate pain., Disp: 30 tablet, Rfl: 0  Social History: Reviewed -  reports that she has been smoking Cigarettes.  She has a 8.25 pack-year smoking history. She has never used smokeless tobacco.  Objective Findings:  Vitals: Blood pressure 138/72, pulse 86, height 5\' 5"  (1.651 m), weight 138 lb 3.2 oz (62.7 kg).  Physical Examination: General appearance - alert, well appearing, and in no distress Mental status - alert, oriented to person, place, and time Pelvic -  VULVA: normal appearing vulva with no masses, tenderness or lesions,  VAGINA: normal appearing vagina with normal color and discharge, no lesions,  CERVIX: normal appearing cervix without discharge or lesions,  UTERUS: uterus is well-supported, normal size, shape, consistency and nontender,  ADNEXA: normal adnexa in size, nontender and no masses   Assessment & Plan:   A:  1. STD screening  P:  1. HIV, RPR, GC/Chlamydia test    By signing my name below, I, Sonum Patel, attest that this documentation has been prepared under the direction and in the presence of Jonnie Kind, MD. Electronically Signed: Sonum Patel, Education administrator. 05/31/16. 3:20 PM.  I personally performed the services described in this documentation, which was SCRIBED in my presence. The recorded information has been reviewed and considered accurate. It has been edited as necessary during review. Jonnie Kind, MD

## 2016-06-02 ENCOUNTER — Ambulatory Visit (HOSPITAL_COMMUNITY)
Admission: RE | Admit: 2016-06-02 | Discharge: 2016-06-02 | Disposition: A | Discharge status: Home / Self Care | Payer: Medicare Other | Source: Ambulatory Visit | Attending: Internal Medicine | Admitting: Internal Medicine

## 2016-06-02 DIAGNOSIS — Z9049 Acquired absence of other specified parts of digestive tract: Secondary | ICD-10-CM | POA: Insufficient documentation

## 2016-06-02 DIAGNOSIS — R935 Abnormal findings on diagnostic imaging of other abdominal regions, including retroperitoneum: Secondary | ICD-10-CM | POA: Diagnosis not present

## 2016-06-02 DIAGNOSIS — R1011 Right upper quadrant pain: Secondary | ICD-10-CM | POA: Diagnosis present

## 2016-06-02 DIAGNOSIS — K7460 Unspecified cirrhosis of liver: Secondary | ICD-10-CM | POA: Diagnosis not present

## 2016-06-02 DIAGNOSIS — M12851 Other specific arthropathies, not elsewhere classified, right hip: Secondary | ICD-10-CM | POA: Insufficient documentation

## 2016-06-02 DIAGNOSIS — K5730 Diverticulosis of large intestine without perforation or abscess without bleeding: Secondary | ICD-10-CM | POA: Insufficient documentation

## 2016-06-02 DIAGNOSIS — I700 Atherosclerosis of aorta: Secondary | ICD-10-CM | POA: Diagnosis not present

## 2016-06-02 LAB — POCT I-STAT CREATININE: Creatinine, Ser: 0.7 mg/dL (ref 0.44–1.00)

## 2016-06-02 MED ORDER — IOPAMIDOL (ISOVUE-300) INJECTION 61%
100.0000 mL | Freq: Once | INTRAVENOUS | Status: AC | PRN
  Administered 2016-06-02: 100 mL via INTRAVENOUS

## 2016-06-03 LAB — GC/CHLAMYDIA PROBE AMP
Chlamydia trachomatis, NAA: NEGATIVE
Neisseria gonorrhoeae by PCR: NEGATIVE

## 2016-06-04 LAB — HIV ANTIBODY (ROUTINE TESTING W REFLEX)

## 2016-06-04 LAB — HIV 1/2 AB DIFFERENTIATION
HIV 1 Ab: NEGATIVE
HIV 2 Ab: NEGATIVE

## 2016-06-04 LAB — RPR: RPR Ser Ql: NONREACTIVE

## 2016-06-04 LAB — RNA QUALITATIVE

## 2016-06-05 ENCOUNTER — Telehealth: Payer: Self-pay | Admitting: Infectious Disease

## 2016-06-05 DIAGNOSIS — Z113 Encounter for screening for infections with a predominantly sexual mode of transmission: Secondary | ICD-10-CM

## 2016-06-05 NOTE — Telephone Encounter (Signed)
Patient appears to have had a 4th generation assay that was + on first test, negative on HIV 1, 2 and SHOULD have had HIV QUALITATIVE RNA but this was not done   I would recommend bringing her back to perform HIV quantitative RNA (viral load) in case this might be acute HIV  She did test + for [facility] in April as well  I will cc her MD from OB/GYN Dr, Glo Herring first

## 2016-06-07 ENCOUNTER — Encounter: Payer: Self-pay | Admitting: Infectious Disease

## 2016-06-07 ENCOUNTER — Encounter: Payer: Self-pay | Admitting: Internal Medicine

## 2016-06-07 ENCOUNTER — Other Ambulatory Visit: Payer: Self-pay | Admitting: Obstetrics and Gynecology

## 2016-06-07 NOTE — Telephone Encounter (Signed)
Pt aware of need to check HIV quantitative., will get done in am.

## 2016-06-07 NOTE — Progress Notes (Signed)
Pt contacted and will pick up authorization in the morning for quantitative HIV testing.

## 2016-06-07 NOTE — Addendum Note (Signed)
Addended by: Jonnie Kind on: 06/07/2016 04:44 PM   Modules accepted: Orders

## 2016-06-07 NOTE — Progress Notes (Signed)
APPT MADE AND ON RECALL FOR REPEAT EGD

## 2016-06-08 ENCOUNTER — Other Ambulatory Visit: Payer: Self-pay | Admitting: *Deleted

## 2016-06-08 DIAGNOSIS — Z113 Encounter for screening for infections with a predominantly sexual mode of transmission: Secondary | ICD-10-CM

## 2016-06-09 ENCOUNTER — Other Ambulatory Visit: Payer: Self-pay | Admitting: Obstetrics and Gynecology

## 2016-06-10 LAB — HIV-1 RNA QUANT-NO REFLEX-BLD: HIV-1 RNA Viral Load: <20 copies/mL

## 2016-06-14 ENCOUNTER — Telehealth: Payer: Self-pay | Admitting: *Deleted

## 2016-06-14 ENCOUNTER — Other Ambulatory Visit: Payer: Self-pay | Admitting: Obstetrics and Gynecology

## 2016-06-14 NOTE — Telephone Encounter (Signed)
Pt informed of negative HIV, RPR, GC/CHL. Pt verbalized understanding.

## 2016-07-03 ENCOUNTER — Other Ambulatory Visit: Payer: Self-pay | Admitting: Obstetrics and Gynecology

## 2016-07-05 ENCOUNTER — Other Ambulatory Visit: Payer: Self-pay | Admitting: Obstetrics and Gynecology

## 2016-07-08 ENCOUNTER — Telehealth: Payer: Self-pay | Admitting: Obstetrics and Gynecology

## 2016-07-08 NOTE — Telephone Encounter (Signed)
Pt called stating that she needs a refill of two of her medication. Please contact pt

## 2016-07-08 NOTE — Telephone Encounter (Signed)
Pt requesting refill Acyclovir 400 mg and Tramadol 50 mg.

## 2016-07-13 ENCOUNTER — Other Ambulatory Visit: Payer: Self-pay | Admitting: Obstetrics and Gynecology

## 2016-07-14 NOTE — Telephone Encounter (Signed)
Patient informed that I do not believe should be prescribing pain medicine for her and advised that she should either seek pain clinic management or primary care physician management. She states that she sees Dr. Toney Rakes primary care and has an appointment in January 2 [facility] [place]. Patient accepts my recommendation that of someone else manage her pain issues

## 2016-07-27 ENCOUNTER — Other Ambulatory Visit: Payer: Self-pay | Admitting: Acute Care

## 2016-07-27 ENCOUNTER — Telehealth: Payer: Self-pay | Admitting: Acute Care

## 2016-07-27 ENCOUNTER — Ambulatory Visit: Payer: Medicare Other | Admitting: Gastroenterology

## 2016-07-27 DIAGNOSIS — F17210 Nicotine dependence, cigarettes, uncomplicated: Secondary | ICD-10-CM

## 2016-07-27 NOTE — Telephone Encounter (Signed)
I have called the patient with he results of her low dose screening CT. I explained that her scan was read as a Lung RADS 2: nodules that are benign in appearance and behavior with a very low likelihood of becoming a clinically active cancer due to size or lack of growth. Recommendation per radiology is for a repeat LDCT in 12 months.I told her we will order and schedule her scan for 12/2016. I also explained that her scan indicated Coronary artery calcifications. I explained that degree and severity  Could not be assessed with this non-gated exam. We discussed dietary modifications she could make with her diet. With a significant history of cirrhosis and Hep C, she may not be a candidate for statin therapy or ASA therapy. She is currently taking fish oil per her PCP. She does have regular monitoring of her cholesterol and triglycerides. I will fax results to the patients PCP. She verbalized understanding of the above and had no further questions upon completion of the call.

## 2016-08-03 ENCOUNTER — Telehealth: Payer: Self-pay | Admitting: Internal Medicine

## 2016-08-03 NOTE — Telephone Encounter (Signed)
Letter mailed

## 2016-08-03 NOTE — Telephone Encounter (Signed)
recall for ultrasound 

## 2016-08-06 ENCOUNTER — Telehealth: Payer: Self-pay

## 2016-08-06 ENCOUNTER — Ambulatory Visit (INDEPENDENT_AMBULATORY_CARE_PROVIDER_SITE_OTHER): Payer: Medicare Other | Admitting: Gastroenterology

## 2016-08-06 ENCOUNTER — Encounter: Payer: Self-pay | Admitting: Gastroenterology

## 2016-08-06 VITALS — BP 145/92 | HR 102 | Temp 97.0°F | Ht 65.0 in | Wt 150.8 lb

## 2016-08-06 DIAGNOSIS — K7030 Alcoholic cirrhosis of liver without ascites: Secondary | ICD-10-CM | POA: Diagnosis not present

## 2016-08-06 MED ORDER — LIDOCAINE 5 % EX PTCH: 1.0000 | MEDICATED_PATCH | CUTANEOUS | 0 refills | Status: DC

## 2016-08-06 NOTE — Progress Notes (Signed)
CC'ED TO PCP 

## 2016-08-06 NOTE — Assessment & Plan Note (Signed)
65 year old female well-compensated at this time. Needs [place] in 6 months and due for labs now. Chronic abdominal wall pain noted and requesting narcotics, which were declined. She is requesting a referral to PT, which I have completed for her. Also requesting lidoderm patches for abdomen, which she has done well with historically. I filled this until she is able to see Pain Management, which is upcoming in January. She was informed we could not prescribe narcotic therapy. Will see her again in 6 months, with next EGD for variceal screening in 1-2 years.

## 2016-08-06 NOTE — Patient Instructions (Signed)
Please have blood work done today.   I have referred you to Physical Therapy in the near future.  I have also given you a prescription to get an abdominal binder at [facility].   We will see you in 6 months! You will have an ultrasound due at that time as well.
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
## 2016-08-06 NOTE — Telephone Encounter (Signed)
Called PT in [place] for referral d/t abdominal wall pain. LMOM and asked for a return call to set-up referral.

## 2016-08-09 ENCOUNTER — Other Ambulatory Visit: Payer: Self-pay

## 2016-08-09 DIAGNOSIS — R109 Unspecified abdominal pain: Secondary | ICD-10-CM

## 2016-08-09 NOTE — Telephone Encounter (Signed)
Lisa at PT called and LMOVM. Referral can be faxed to [phone]. Referral info faxed.

## 2016-08-20 ENCOUNTER — Other Ambulatory Visit: Payer: Self-pay | Admitting: Obstetrics and Gynecology

## 2016-08-20 DIAGNOSIS — Z1231 Encounter for screening mammogram for malignant neoplasm of breast: Secondary | ICD-10-CM

## 2016-09-03 ENCOUNTER — Other Ambulatory Visit: Payer: Self-pay | Admitting: Obstetrics and Gynecology

## 2016-09-03 ENCOUNTER — Ambulatory Visit (HOSPITAL_COMMUNITY): Payer: Self-pay

## 2016-09-03 DIAGNOSIS — Z1231 Encounter for screening mammogram for malignant neoplasm of breast: Secondary | ICD-10-CM

## 2016-09-03 LAB — CBC
HCT: 40.5 % (ref 35.0–45.0)
Hemoglobin: 13.4 g/dL (ref 11.7–15.5)
MCH: 31.2 pg (ref 27.0–33.0)
MCHC: 33.1 g/dL (ref 32.0–36.0)
MCV: 94.4 fL (ref 80.0–100.0)
MPV: 10.8 fL (ref 7.5–12.5)
Platelets: 136 10*3/uL — ABNORMAL LOW (ref 140–400)
RBC: 4.29 MIL/uL (ref 3.80–5.10)
RDW: 13.9 % (ref 11.0–15.0)
WBC: 9.6 10*3/uL (ref 3.8–10.8)

## 2016-09-03 LAB — PROTIME-INR
INR: 1
Prothrombin Time: 11 s (ref 9.0–11.5)

## 2016-09-04 LAB — COMPLETE METABOLIC PANEL WITH GFR
ALT: 19 U/L (ref 6–29)
AST: 32 U/L (ref 10–35)
Albumin: 4 g/dL (ref 3.6–5.1)
Alkaline Phosphatase: 104 U/L (ref 33–130)
BUN: 15 mg/dL (ref 7–25)
CO2: 28 mmol/L (ref 20–31)
Calcium: 8.9 mg/dL (ref 8.6–10.4)
Chloride: 103 mmol/L (ref 98–110)
Creat: 0.87 mg/dL (ref 0.50–0.99)
GFR, Est African American: 81 mL/min (ref >=60)
GFR, Est Non African American: 70 mL/min (ref >=60)
Glucose, Bld: 84 mg/dL (ref 65–99)
Potassium: 4.1 mmol/L (ref 3.5–5.3)
Sodium: 137 mmol/L (ref 135–146)
Total Bilirubin: 0.9 mg/dL (ref 0.2–1.2)
Total Protein: 6.7 g/dL (ref 6.1–8.1)

## 2016-09-07 NOTE — Progress Notes (Signed)
LMOM to call.

## 2016-09-07 NOTE — Progress Notes (Signed)
MELD 9. LFTs normal. Office visit in 6 months.

## 2016-09-08 ENCOUNTER — Ambulatory Visit (HOSPITAL_COMMUNITY): Payer: Self-pay

## 2016-09-13 ENCOUNTER — Ambulatory Visit: Payer: Medicare Other | Attending: Internal Medicine | Admitting: Physical Therapy

## 2016-09-13 ENCOUNTER — Encounter: Payer: Self-pay | Admitting: Internal Medicine

## 2016-09-13 DIAGNOSIS — R293 Abnormal posture: Secondary | ICD-10-CM | POA: Insufficient documentation
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
## 2016-09-13 NOTE — Progress Notes (Signed)
APPT MADE AND LETTER SENT  °

## 2016-09-14 ENCOUNTER — Other Ambulatory Visit: Payer: Self-pay

## 2016-09-14 ENCOUNTER — Telehealth: Payer: Self-pay

## 2016-09-14 DIAGNOSIS — K7460 Unspecified cirrhosis of liver: Secondary | ICD-10-CM

## 2016-09-14 DIAGNOSIS — B1920 Unspecified viral hepatitis C without hepatic coma: Secondary | ICD-10-CM

## 2016-09-14 NOTE — Patient Instructions (Signed)
No PA needed for [place] abd RUQ.

## 2016-09-14 NOTE — Telephone Encounter (Signed)
Pt called office to schedule ultrasound, she had received letter in mail. [place] abd RUQ scheduled for 09/20/16 at 8:30am, arrive at 8:15 am. NPO after midnight. Called and informed pt.

## 2016-09-15 ENCOUNTER — Ambulatory Visit (HOSPITAL_COMMUNITY)
Admission: RE | Admit: 2016-09-15 | Discharge: 2016-09-15 | Disposition: A | Discharge status: Home / Self Care | Payer: Medicare Other | Source: Ambulatory Visit | Attending: Obstetrics and Gynecology | Admitting: Obstetrics and Gynecology

## 2016-09-15 DIAGNOSIS — Z1231 Encounter for screening mammogram for malignant neoplasm of breast: Secondary | ICD-10-CM | POA: Insufficient documentation

## 2016-09-15 NOTE — Telephone Encounter (Signed)
Patient does not need an abd u/s at this time since she had CT in 05/2016. Next abd u/s for hepatoma screening is due 11/2016.

## 2016-09-15 NOTE — Telephone Encounter (Signed)
Back pain is not an indication for abd u/s. She does not need abd u/s for hepatoma screening or her cirrhosis.   If she is having ruq pain she can have an abd u/s. If so, you will need to send order to me to sign.

## 2016-09-15 NOTE — Telephone Encounter (Signed)
Called and informed pt of LSL recommendation. However, pt said that she is having pain and cramping in her lower back that started 2 days ago. She wants to have the Ultrasound done.   Magda Paganini, please advise.

## 2016-09-16 ENCOUNTER — Other Ambulatory Visit: Payer: Self-pay | Admitting: Obstetrics and Gynecology

## 2016-09-16 ENCOUNTER — Other Ambulatory Visit: Payer: Self-pay

## 2016-09-16 DIAGNOSIS — R928 Other abnormal and inconclusive findings on diagnostic imaging of breast: Secondary | ICD-10-CM

## 2016-09-16 NOTE — Telephone Encounter (Signed)
Called and informed pt. She is not having any RUQ pain at this time. Called central scheduling and cancelled ultrasound.

## 2016-09-20 ENCOUNTER — Ambulatory Visit (HOSPITAL_COMMUNITY): Payer: Medicare Other

## 2016-09-21 ENCOUNTER — Encounter: Payer: Self-pay | Admitting: *Deleted

## 2016-09-22 ENCOUNTER — Other Ambulatory Visit: Payer: Medicare Other | Admitting: Obstetrics and Gynecology

## 2016-09-23 ENCOUNTER — Encounter: Payer: Self-pay | Admitting: *Deleted

## 2016-09-28 ENCOUNTER — Encounter (HOSPITAL_COMMUNITY): Payer: Self-pay

## 2016-09-30 DIAGNOSIS — R1084 Generalized abdominal pain: Secondary | ICD-10-CM | POA: Diagnosis not present

## 2016-09-30 DIAGNOSIS — G894 Chronic pain syndrome: Secondary | ICD-10-CM | POA: Diagnosis not present

## 2016-09-30 DIAGNOSIS — M545 Low back pain: Secondary | ICD-10-CM | POA: Diagnosis not present

## 2016-09-30 DIAGNOSIS — M5416 Radiculopathy, lumbar region: Secondary | ICD-10-CM | POA: Diagnosis not present

## 2016-10-12 ENCOUNTER — Ambulatory Visit (HOSPITAL_COMMUNITY)
Admission: RE | Admit: 2016-10-12 | Discharge: 2016-10-12 | Disposition: A | Discharge status: Home / Self Care | Payer: Medicare Other | Source: Ambulatory Visit | Attending: Obstetrics and Gynecology | Admitting: Obstetrics and Gynecology

## 2016-10-12 DIAGNOSIS — R928 Other abnormal and inconclusive findings on diagnostic imaging of breast: Secondary | ICD-10-CM | POA: Diagnosis not present

## 2016-10-12 DIAGNOSIS — N6320 Unspecified lump in the left breast, unspecified quadrant: Secondary | ICD-10-CM | POA: Insufficient documentation

## 2016-10-13 DIAGNOSIS — Z79899 Other long term (current) drug therapy: Secondary | ICD-10-CM | POA: Diagnosis not present

## 2016-10-13 DIAGNOSIS — Z5181 Encounter for therapeutic drug level monitoring: Secondary | ICD-10-CM | POA: Diagnosis not present

## 2016-10-19 ENCOUNTER — Ambulatory Visit (HOSPITAL_COMMUNITY)
Admission: RE | Admit: 2016-10-19 | Discharge: 2016-10-19 | Disposition: A | Discharge status: Home / Self Care | Payer: Medicare Other | Source: Ambulatory Visit | Attending: Obstetrics and Gynecology | Admitting: Obstetrics and Gynecology

## 2016-10-19 DIAGNOSIS — N6489 Other specified disorders of breast: Secondary | ICD-10-CM | POA: Diagnosis not present

## 2016-10-19 DIAGNOSIS — N6002 Solitary cyst of left breast: Secondary | ICD-10-CM | POA: Diagnosis not present

## 2016-10-19 DIAGNOSIS — R928 Other abnormal and inconclusive findings on diagnostic imaging of breast: Secondary | ICD-10-CM | POA: Insufficient documentation

## 2016-10-22 DIAGNOSIS — J441 Chronic obstructive pulmonary disease with (acute) exacerbation: Secondary | ICD-10-CM | POA: Diagnosis not present

## 2016-10-29 DIAGNOSIS — M545 Low back pain: Secondary | ICD-10-CM | POA: Diagnosis not present

## 2016-10-29 DIAGNOSIS — M5416 Radiculopathy, lumbar region: Secondary | ICD-10-CM | POA: Diagnosis not present

## 2016-10-29 DIAGNOSIS — G894 Chronic pain syndrome: Secondary | ICD-10-CM | POA: Diagnosis not present

## 2016-10-29 DIAGNOSIS — R1084 Generalized abdominal pain: Secondary | ICD-10-CM | POA: Diagnosis not present

## 2016-11-11 DIAGNOSIS — Z5181 Encounter for therapeutic drug level monitoring: Secondary | ICD-10-CM | POA: Diagnosis not present

## 2016-11-11 DIAGNOSIS — Z79899 Other long term (current) drug therapy: Secondary | ICD-10-CM | POA: Diagnosis not present

## 2016-11-26 DIAGNOSIS — G894 Chronic pain syndrome: Secondary | ICD-10-CM | POA: Diagnosis not present

## 2016-11-26 DIAGNOSIS — M5416 Radiculopathy, lumbar region: Secondary | ICD-10-CM | POA: Diagnosis not present

## 2016-11-26 DIAGNOSIS — R1084 Generalized abdominal pain: Secondary | ICD-10-CM | POA: Diagnosis not present

## 2016-11-26 DIAGNOSIS — M545 Low back pain: Secondary | ICD-10-CM | POA: Diagnosis not present

## 2016-12-08 DIAGNOSIS — Z79899 Other long term (current) drug therapy: Secondary | ICD-10-CM | POA: Diagnosis not present

## 2016-12-08 DIAGNOSIS — Z5181 Encounter for therapeutic drug level monitoring: Secondary | ICD-10-CM | POA: Diagnosis not present

## 2016-12-24 DIAGNOSIS — M545 Low back pain: Secondary | ICD-10-CM | POA: Diagnosis not present

## 2016-12-24 DIAGNOSIS — G894 Chronic pain syndrome: Secondary | ICD-10-CM | POA: Diagnosis not present

## 2016-12-24 DIAGNOSIS — R1084 Generalized abdominal pain: Secondary | ICD-10-CM | POA: Diagnosis not present

## 2016-12-27 ENCOUNTER — Encounter: Payer: Self-pay | Admitting: Gastroenterology

## 2016-12-28 ENCOUNTER — Telehealth: Payer: Self-pay | Admitting: Internal Medicine

## 2016-12-28 NOTE — Telephone Encounter (Signed)
RECALL FOR ULTRASOUND 

## 2016-12-28 NOTE — Telephone Encounter (Signed)
Letter mailed

## 2017-01-03 ENCOUNTER — Other Ambulatory Visit: Payer: Self-pay

## 2017-01-03 DIAGNOSIS — B1920 Unspecified viral hepatitis C without hepatic coma: Secondary | ICD-10-CM

## 2017-01-03 DIAGNOSIS — K7460 Unspecified cirrhosis of liver: Secondary | ICD-10-CM

## 2017-01-04 ENCOUNTER — Ambulatory Visit (HOSPITAL_COMMUNITY)
Admission: RE | Admit: 2017-01-04 | Discharge: 2017-01-04 | Disposition: A | Discharge status: Home / Self Care | Payer: Medicare Other | Source: Ambulatory Visit | Attending: Acute Care | Admitting: Acute Care

## 2017-01-04 DIAGNOSIS — F17210 Nicotine dependence, cigarettes, uncomplicated: Secondary | ICD-10-CM | POA: Diagnosis not present

## 2017-01-06 DIAGNOSIS — Z79899 Other long term (current) drug therapy: Secondary | ICD-10-CM | POA: Diagnosis not present

## 2017-01-06 DIAGNOSIS — Z5181 Encounter for therapeutic drug level monitoring: Secondary | ICD-10-CM | POA: Diagnosis not present

## 2017-01-20 ENCOUNTER — Other Ambulatory Visit: Payer: Self-pay | Admitting: Acute Care

## 2017-01-20 DIAGNOSIS — F17210 Nicotine dependence, cigarettes, uncomplicated: Secondary | ICD-10-CM

## 2017-01-24 DIAGNOSIS — J449 Chronic obstructive pulmonary disease, unspecified: Secondary | ICD-10-CM | POA: Diagnosis not present

## 2017-01-24 DIAGNOSIS — I10 Essential (primary) hypertension: Secondary | ICD-10-CM | POA: Diagnosis not present

## 2017-01-27 DIAGNOSIS — M545 Low back pain: Secondary | ICD-10-CM | POA: Diagnosis not present

## 2017-01-27 DIAGNOSIS — G894 Chronic pain syndrome: Secondary | ICD-10-CM | POA: Diagnosis not present

## 2017-01-27 DIAGNOSIS — R1084 Generalized abdominal pain: Secondary | ICD-10-CM | POA: Diagnosis not present

## 2017-02-02 ENCOUNTER — Ambulatory Visit (HOSPITAL_COMMUNITY): Admission: RE | Admit: 2017-02-02 | Payer: Medicare Other | Source: Ambulatory Visit

## 2017-02-09 ENCOUNTER — Ambulatory Visit (HOSPITAL_COMMUNITY)
Admission: RE | Admit: 2017-02-09 | Discharge: 2017-02-09 | Disposition: A | Discharge status: Home / Self Care | Payer: Medicare Other | Source: Ambulatory Visit | Attending: Gastroenterology | Admitting: Gastroenterology

## 2017-02-09 DIAGNOSIS — Z9049 Acquired absence of other specified parts of digestive tract: Secondary | ICD-10-CM | POA: Diagnosis not present

## 2017-02-09 DIAGNOSIS — K7460 Unspecified cirrhosis of liver: Secondary | ICD-10-CM | POA: Diagnosis not present

## 2017-02-09 DIAGNOSIS — B1920 Unspecified viral hepatitis C without hepatic coma: Secondary | ICD-10-CM | POA: Diagnosis present

## 2017-02-10 NOTE — Progress Notes (Signed)
NO HEPATOMA.   NIC for ruq u/s in six months.

## 2017-02-14 NOTE — Progress Notes (Signed)
ON RECALL  °

## 2017-02-15 ENCOUNTER — Emergency Department (HOSPITAL_COMMUNITY): Payer: Medicare Other

## 2017-02-15 ENCOUNTER — Emergency Department (HOSPITAL_COMMUNITY)
Admission: EM | Admit: 2017-02-15 | Discharge: 2017-02-15 | Disposition: A | Discharge status: Home / Self Care | Payer: Medicare Other | Attending: Emergency Medicine | Admitting: Emergency Medicine

## 2017-02-15 ENCOUNTER — Encounter (HOSPITAL_COMMUNITY): Payer: Self-pay | Admitting: Emergency Medicine

## 2017-02-15 DIAGNOSIS — S82831A Other fracture of upper and lower end of right fibula, initial encounter for closed fracture: Secondary | ICD-10-CM

## 2017-02-15 DIAGNOSIS — F17210 Nicotine dependence, cigarettes, uncomplicated: Secondary | ICD-10-CM | POA: Insufficient documentation

## 2017-02-15 DIAGNOSIS — S82421A Displaced transverse fracture of shaft of right fibula, initial encounter for closed fracture: Secondary | ICD-10-CM | POA: Diagnosis not present

## 2017-02-15 DIAGNOSIS — I10 Essential (primary) hypertension: Secondary | ICD-10-CM | POA: Insufficient documentation

## 2017-02-15 DIAGNOSIS — Y929 Unspecified place or not applicable: Secondary | ICD-10-CM | POA: Insufficient documentation

## 2017-02-15 DIAGNOSIS — W010XXA Fall on same level from slipping, tripping and stumbling without subsequent striking against object, initial encounter: Secondary | ICD-10-CM | POA: Insufficient documentation

## 2017-02-15 DIAGNOSIS — Y939 Activity, unspecified: Secondary | ICD-10-CM | POA: Insufficient documentation

## 2017-02-15 DIAGNOSIS — S99911A Unspecified injury of right ankle, initial encounter: Secondary | ICD-10-CM | POA: Diagnosis present

## 2017-02-15 DIAGNOSIS — Z79899 Other long term (current) drug therapy: Secondary | ICD-10-CM | POA: Diagnosis not present

## 2017-02-15 DIAGNOSIS — M25571 Pain in right ankle and joints of right foot: Secondary | ICD-10-CM | POA: Diagnosis not present

## 2017-02-15 DIAGNOSIS — Y999 Unspecified external cause status: Secondary | ICD-10-CM | POA: Diagnosis not present

## 2017-02-15 DIAGNOSIS — R531 Weakness: Secondary | ICD-10-CM | POA: Diagnosis not present

## 2017-02-15 DIAGNOSIS — R404 Transient alteration of awareness: Secondary | ICD-10-CM | POA: Diagnosis not present

## 2017-02-15 MED ORDER — OXYCODONE HCL 5 MG PO TABS: 5.0000 mg | ORAL_TABLET | ORAL | 0 refills | Status: DC | PRN
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
## 2017-02-15 NOTE — ED Notes (Signed)
Pt not in waiting area 

## 2017-02-15 NOTE — Discharge Instructions (Signed)
You have fractured your fibula bone. Ankle boot, crutches, elevation, ice, pain medication. Follow-up with orthopedic doctor. Phone number given.

## 2017-02-15 NOTE — ED Provider Notes (Signed)
[facility] DEPT Provider Note   CSN: [id number] Arrival date & time: 02/15/17  1151     History   Chief Complaint Chief Complaint  Patient presents with  . Ankle Pain    HPI Sabrina Wyatt is a 66 y.o. female.  Accidental trip and fall early this morning twisting her right ankle. No other injury. Severity of pain is moderate. Patient is unable to stand without pain.      Past Medical History:  Diagnosis Date  . Abdominal wall pain    chronic  . Arthritis    rheumatoid  . Biliary stone   . Carpal tunnel syndrome   . Chronic abdominal pain   . Chronic flank pain   . Chronic low back pain   . Cirrhosis, hepatitis C    U/S on 03/09/16= no HCC, pt has been vaccinated for Hep A and Hep B. s/p treatment of HCV with eradication  . Depression   . GERD (gastroesophageal reflux disease)   . Gonorrhea   . Hypertension   . Intracranial hemorrhage ([place]) 1998   left thalmic  . Intussusception ([place]) 04/2012  . Polysubstance abuse    HX of  . PTSD (post-traumatic stress disorder)   . Pulmonary nodules   . S/P colonoscopy 2005   Dr Moss Mc polyp removed, otherwise normal  . S/P endoscopy 08/27/10   antral erosions, otherwise normal, due egd 08/2012 to screen for varices  . Seizures ([place]) 1998   with brain bleed x1. None since then and on no meds  . Shortness of breath   . Tobacco abuse     Patient Active Problem List   Diagnosis Date Noted  . 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 Screening for STD (sexually transmitted disease) 12/19/2015  . Vaginal laceration 10/25/2015  . Acute sinusitis with symptoms > 10 days 08/04/2015  . Epigastric mass 04/16/2015  . Abdominal swelling 04/16/2015  . Mucosal abnormality of stomach   . Nausea without vomiting 02/17/2015  . Loss of weight 02/17/2015  . Chronic folliculitis 88/41/6606  . 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 Insomnia 03/12/2014  . Folliculitis 30/16/0109  . Cirrhosis of liver ([place]) 11/15/2013  . Lower extremity edema  11/15/2013  . Abdominal pain, chronic, epigastric 07/10/2013  . Anorexia nervosa 07/10/2013  . 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 Muscle weakness (generalized) 12/28/2011  . CARPAL TUNNEL SYNDROME 10/16/2008  . ABDOMINAL BLOATING 10/16/2008  . Alcohol abuse 04/25/2008  . TOBACCO ABUSE 04/25/2008  . POST TRAUMATIC STRESS SYNDROME 04/22/2008  . DEPRESSION 04/22/2008  . INTRACRANIAL HEMORRHAGE 04/22/2008  . Esophageal reflux 04/22/2008  . INTUSSUSCEPTION 04/22/2008  . ABDOMINAL ADHESIONS 04/22/2008  . Hepatic cirrhosis due to chronic hepatitis C infection ([place]) 04/22/2008  . OBSTRUCTION OF BILE DUCT 04/22/2008  . LOW BACK PAIN, CHRONIC sees [facility]. 04/22/2008  . Abdominal pain 04/22/2008  . ABDOMINAL PAIN OTHER SPECIFIED SITE 04/22/2008  . HEPATITIS C, HX OF 04/22/2008    Past Surgical History:  Procedure Laterality Date  . biliary stone removal  2015  . BIOPSY N/A 03/06/2015   Procedure: BIOPSY;  Surgeon: Daneil Dolin, MD;  Location: [facility];  Service: Endoscopy;  Laterality: N/A;  . CARPAL TUNNEL RELEASE Bilateral 12/2010  . CATARACT EXTRACTION W/PHACO Right 01/15/2016   Procedure: CATARACT EXTRACTION PHACO AND INTRAOCULAR LENS PLACEMENT RIGHT EYE CDE=5.88;  Surgeon: Tonny Branch, MD;  Location: [facility];  Service: Ophthalmology;  Laterality: Right;  .  CATARACT EXTRACTION W/PHACO Left 02/12/2016   Procedure: CATARACT EXTRACTION PHACO AND INTRAOCULAR LENS PLACEMENT LEFT EYE; CDE:  6.02;  Surgeon: Tonny Branch, MD;  Location: [facility];  Service: Ophthalmology;  Laterality: Left;  . CHOLECYSTECTOMY  2002  . COLONOSCOPY    09/10/2003   diminutive polyp in the rectum cold biopsied/removed/ Normal colon  . COLONOSCOPY WITH PROPOFOL N/A 08/02/2013   QZE:[id number] diverticulosis. next TCS 10 years.  . ESOPHAGOGASTRODUODENOSCOPY    09/10/2003   Small hiatal hernia; otherwise  normal stomach, normal D1 and D2  . ESOPHAGOGASTRODUODENOSCOPY  08/27/2010   TMA:UQJFHL esophagus.  No varices.  Couple of tiny antral erosions of doubtful clinical significance, otherwise normal stomach, D1 and D2.  . ESOPHAGOGASTRODUODENOSCOPY (EGD) WITH PROPOFOL N/A 08/02/2013   RMR: Portal gastropathy. No explanation for abdominal pain which I believe is more abdominal wall in origin. She has been seen by Dr. Carlis Abbott  over at [facility]. She is up-to-date on cross sectional imaging. She has a pain management physician in [place]. Repeat EGD for varices in 3 years.  . ESOPHAGOGASTRODUODENOSCOPY (EGD) WITH PROPOFOL N/A 03/06/2015   KTG:[id number] gastric mucosac s/p bx  . LUMBAR FUSION    . NOSE SURGERY  1970  . PERCUTANEOUS TRANSHEPATIC CHOLANGIOGRAHPY AND BILLIARY DRAINAGE  2002  . REPAIR VAGINAL CUFF N/A 10/25/2015   Procedure: REPAIR VAGINAL LACERATION ;  Surgeon: Jonnie Kind, MD;  Location: [facility];  Service: Gynecology;  Laterality: N/A;  . Roux-en-y-hepatojejunostomy  2003   revision in 2013 for intussusception [facility] Dr. Bailey Mech)  . SPINAL FUSION     C5-C7    OB History    Gravida Para Term Preterm AB Living   2 1 1   1      SAB TAB Ectopic Multiple Live Births       1           Home Medications    Prior to Admission medications   Medication Sig Start Date End Date Taking? Authorizing Provider  acyclovir (ZOVIRAX) 400 MG tablet Take 1 tablet (400 mg total) by mouth 2 (two) times daily. 07/14/16   Jonnie Kind, MD  albuterol (PROVENTIL) (2.5 MG/3ML) 0.083% nebulizer solution Take 2.5 mg by nebulization every 6 (six) hours as needed for wheezing or shortness of breath.     [provider]  ALPRAZolam Duanne Moron) 0.5 MG tablet Take 0.5 mg by mouth at bedtime. 09/25/15   [provider]  atenolol (TENORMIN) 25 MG tablet Take 25 mg by mouth every morning.     [provider]  Brexpiprazole (REXULTI) 1 MG TABS 1 mg daily. 03/23/16   [provider]  Calcium-Magnesium-Vitamin D (CALCIUM 500 PO) Take 1 tablet by mouth daily.    [provider]  Cholecalciferol (VITAMIN D-3) 1000 UNITS CAPS Take 1,000 capsules by mouth daily.    [provider]  Cyanocobalamin (VITAMIN B 12 PO) Take 1 tablet by mouth daily.    [provider]  DULoxetine (CYMBALTA) 60 MG capsule Take 60 mg by mouth 2 (two) times daily.     [provider]  EPIPEN 2-PAK 0.3 MG/0.3ML SOAJ injection Inject 0.3 mg into the skin as needed (allergic reactions).  03/04/13   [provider]  etodolac (LODINE) 400 MG tablet Take 400 mg by mouth 2 (two) times daily.  04/13/12   [provider]  ferrous sulfate 325 (65 FE) MG tablet Take 325 mg by mouth daily with breakfast.    [provider]  Flaxseed, Linseed, (FLAX SEEDS PO) Take 1 tablet by mouth daily.    [provider]  fluticasone (FLONASE) 50 MCG/ACT nasal spray Place 2 sprays into the nose daily.      [provider]  hydrOXYzine (VISTARIL) 50 MG capsule Take 100 mg by mouth daily.    [provider]  levocetirizine (XYZAL) 5 MG tablet Take 5 mg by mouth daily. 10/09/15   [provider]  lidocaine (LIDODERM) 5 % Place 1 patch onto the skin daily. Remove & Discard patch after 12 hours. Wait 12 hours, and then reapply a new patch. 08/06/16   Annitta Needs, NP  Omega-3 Fatty Acids (FISH OIL PO) Take 2 capsules by mouth daily. Reported on 09/26/2015    [provider]  omeprazole (PRILOSEC) 20 MG capsule TAKE ONE CAPSULE BY MOUTH EVERY DAY 09/10/12   Andria Meuse, NP  oxyCODONE (ROXICODONE) 5 MG immediate release tablet Take 1 tablet (5 mg total) by mouth every 4 (four) hours as needed for severe pain. 02/15/17   Nat Christen, MD  PROAIR HFA 108 (90 BASE) MCG/ACT inhaler Inhale 2 puffs into the lungs every 6 (six) hours as needed for wheezing or shortness of breath.  02/21/12   [provider]  pyridOXINE  (VITAMIN B-6) 100 MG tablet Take 100 mg by mouth daily.    [provider]  tiZANidine (ZANAFLEX) 4 MG tablet Take 4 mg by mouth 2 (two) times daily as needed for muscle spasms.  07/26/15   [provider]  traMADol (ULTRAM) 50 MG tablet TAKE (1) TABLET BY MOUTH EVERY SIX HOURS AS NEEDED FOR MODERATE OR SEVERE PAIN. 06/09/16   Jonnie Kind, MD    Family History Family History  Problem Relation Age of Onset  . Coronary artery disease Brother   . Cancer Sister        lymphoma  . Cancer Father        brain    Social History Social History  Substance Use Topics  . Smoking status: Current Some Day Smoker    Packs/day: 0.25    Years: 33.00    Types: Cigarettes  . Smokeless tobacco: Never Used     Comment: One pack every 6-7 days/ she is working on quitting  . Alcohol use 0.0 oz/week     Comment: occasionally      Allergies   Asa [aspirin]; Bee venom; and Tylenol [acetaminophen]   Review of Systems Review of Systems  All other systems reviewed and are negative.    Physical Exam Updated Vital Signs BP 123/82 (BP Location: Left Arm)   Pulse 87   Temp 97.5 F (36.4 C) (Oral)   Resp 16   Ht 5\' 5"  (1.651 m)   Wt 63.5 kg (140 lb)   SpO2 98%   BMI 23.30 kg/m   Physical Exam  Constitutional: She is oriented to person, place, and time. She appears well-developed and well-nourished.  HENT:  Head: Normocephalic and atraumatic.  Eyes: Conjunctivae are normal.  Neck: Neck supple.  Cardiovascular: Normal rate and regular rhythm.   Pulmonary/Chest: Effort normal and breath sounds normal.  Abdominal: Soft. Bowel sounds are normal.  Musculoskeletal:  Tender distal right fibula  Neurological: She is alert and oriented to person, place, and time.  Skin: Skin is warm and dry.  Psychiatric: She has a normal mood and affect. Her behavior is normal.  Nursing note and vitals reviewed.    ED Treatments / Results  Labs (all labs  ordered are listed, but only  abnormal results are displayed) Labs Reviewed - No data to display  EKG  EKG Interpretation None       Radiology Dg Ankle Complete Right  Result Date: 02/15/2017 CLINICAL DATA:  Fall.  Pain. EXAM: RIGHT ANKLE - COMPLETE 3+ VIEW COMPARISON:  No recent . FINDINGS: Comminuted fracture of the distal fibula diaphysis with slight displacement noted. Medial malleolus is intact. No other focal abnormality . IMPRESSION: Comminuted fracture of the distal fibular diaphysis with slight displacement. Electronically Signed   By: Marcello Moores  Register   On: 02/15/2017 12:16    Procedures Procedures (including critical care time)  Medications Ordered in ED Medications - No data to display   Initial Impression / Assessment and Plan / ED Course  I have reviewed the triage vital signs and the nursing notes.  Pertinent labs & imaging results that were available during my care of the patient were reviewed by me and considered in my medical decision making (see chart for details).     Plain films of the right lower extremity reveal a comminuted fracture of the distal fibular diaphysis with slight displacement. Will order cam walker, crutches, pain medicine, follow-up with orthopedics.  Final Clinical Impressions(s) / ED Diagnoses   Final diagnoses:  Closed fracture of distal end of right fibula, unspecified fracture morphology, initial encounter    New Prescriptions New Prescriptions   OXYCODONE (ROXICODONE) 5 MG IMMEDIATE RELEASE TABLET    Take 1 tablet (5 mg total) by mouth every 4 (four) hours as needed for severe pain.     Nat Christen, MD 02/15/17 [phone]

## 2017-02-18 ENCOUNTER — Ambulatory Visit (INDEPENDENT_AMBULATORY_CARE_PROVIDER_SITE_OTHER): Payer: Medicare Other | Admitting: Orthopedic Surgery

## 2017-02-18 ENCOUNTER — Encounter: Payer: Self-pay | Admitting: Orthopedic Surgery

## 2017-02-18 VITALS — BP 107/66 | HR 76 | Temp 100.0°F

## 2017-02-18 DIAGNOSIS — S82491A Other fracture of shaft of right fibula, initial encounter for closed fracture: Secondary | ICD-10-CM | POA: Diagnosis not present

## 2017-02-18 NOTE — Patient Instructions (Signed)
Weightbearing as tolerated!

## 2017-02-18 NOTE — Progress Notes (Signed)
Patient ID: Sabrina Wyatt, female   DOB: [birth year], 66 y.o.   MRN: [id number]  Chief Complaint  Patient presents with  . Ankle Pain    fx distal fibular date of injury 02/15/17    HPI Sabrina Wyatt is a 66 y.o. female.   This is a 66 year old female new patient to [place] with a history of COPD and hypertension smoker presents with fracture of her right ankle. 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  Respiratory: Positive for wheezing.   Neurological: Negative for numbness.   Past Medical History:  Diagnosis Date  . Abdominal wall pain    chronic  . Arthritis    rheumatoid  . Biliary stone   . Carpal tunnel syndrome   . Chronic abdominal pain   . Chronic flank pain   . Chronic low back pain   . Cirrhosis, hepatitis C    U/S on 03/09/16= no HCC, pt has been vaccinated for Hep A and Hep B. s/p treatment of HCV with eradication  . Depression   . GERD (gastroesophageal reflux disease)   . Gonorrhea   . Hypertension   . Intracranial hemorrhage ([place]) 1998   left thalmic  . Intussusception ([place]) 04/2012  . Polysubstance abuse    HX of  . PTSD (post-traumatic stress disorder)   . Pulmonary nodules   . S/P colonoscopy 2005   Dr Moss Mc polyp removed, otherwise normal  . S/P endoscopy 08/27/10   antral erosions, otherwise normal, due egd 08/2012 to screen for varices  . Seizures ([place]) 1998   with brain bleed x1. None since then and on no meds  . Shortness of breath   . Tobacco abuse     Past Surgical History:  Procedure Laterality Date  . biliary stone removal  2015  . BIOPSY N/A 03/06/2015   Procedure: BIOPSY;  Surgeon: Daneil Dolin, MD;  Location: [facility];  Service: Endoscopy;  Laterality: N/A;  . CARPAL TUNNEL RELEASE Bilateral 12/2010  . CATARACT EXTRACTION  W/PHACO Right 01/15/2016   Procedure: CATARACT EXTRACTION PHACO AND INTRAOCULAR LENS PLACEMENT RIGHT EYE CDE=5.88;  Surgeon: Tonny Branch, MD;  Location: [facility];  Service: Ophthalmology;  Laterality: Right;  . CATARACT EXTRACTION W/PHACO Left 02/12/2016   Procedure: CATARACT EXTRACTION PHACO AND INTRAOCULAR LENS PLACEMENT LEFT EYE; CDE:  6.02;  Surgeon: Tonny Branch, MD;  Location: [facility];  Service: Ophthalmology;  Laterality: Left;  . CHOLECYSTECTOMY  2002  . COLONOSCOPY    09/10/2003   diminutive polyp in the rectum cold biopsied/removed/ Normal colon  . COLONOSCOPY WITH PROPOFOL N/A 08/02/2013   ZGY:FVCBSWH diverticulosis. next TCS 10 years.  . ESOPHAGOGASTRODUODENOSCOPY    09/10/2003   Small hiatal hernia; otherwise normal stomach, normal D1 and D2  . ESOPHAGOGASTRODUODENOSCOPY  08/27/2010   QPR:FFMBWG esophagus.  No varices.  Couple of tiny antral erosions of doubtful clinical significance, otherwise normal stomach, D1 and D2.  . ESOPHAGOGASTRODUODENOSCOPY (EGD) WITH PROPOFOL N/A 08/02/2013   RMR: Portal gastropathy. No explanation for abdominal pain which I believe is more abdominal wall in origin. She has been seen by Dr. Carlis Abbott  over at [facility]. She is up-to-date on cross sectional imaging. She has a pain management physician in [place]. Repeat EGD for varices in  3 years.  . ESOPHAGOGASTRODUODENOSCOPY (EGD) WITH PROPOFOL N/A 03/06/2015   RCV:ELFYBOFB gastric mucosac s/p bx  . LUMBAR FUSION    . NOSE SURGERY  1970  . PERCUTANEOUS TRANSHEPATIC CHOLANGIOGRAHPY AND BILLIARY DRAINAGE  2002  . REPAIR VAGINAL CUFF N/A 10/25/2015   Procedure: REPAIR VAGINAL LACERATION ;  Surgeon: Jonnie Kind, MD;  Location: [facility];  Service: Gynecology;  Laterality: N/A;  . Roux-en-y-hepatojejunostomy  2003   revision in 2013 for intussusception [facility] Dr. Bailey Mech)  . SPINAL FUSION     C5-C7    Family History  Problem Relation Age of Onset  . Coronary artery disease Brother   . Cancer Sister         lymphoma  . Cancer Father        brain    Social History Social History  Substance Use Topics  . Smoking status: Current Some Day Smoker    Packs/day: 0.25    Years: 33.00    Types: Cigarettes  . Smokeless tobacco: Never Used     Comment: One pack every 6-7 days/ she is working on quitting  . Alcohol use 0.0 oz/week     Comment: occasionally     Allergies  Allergen Reactions  . Asa [Aspirin] Nausea Only  . Bee Venom Hives  . Codeine   . Tylenol [Acetaminophen] Nausea And Vomiting and Swelling    Swelling of hands and feet    Current Outpatient Prescriptions  Medication Sig Dispense Refill  . acyclovir (ZOVIRAX) 400 MG tablet Take 1 tablet (400 mg total) by mouth 2 (two) times daily. 180 tablet 3  . albuterol (PROVENTIL) (2.5 MG/3ML) 0.083% nebulizer solution Take 2.5 mg by nebulization every 6 (six) hours as needed for wheezing or shortness of breath.     . ALPRAZolam (XANAX) 0.5 MG tablet Take 0.5 mg by mouth at bedtime.    Marland Kitchen atenolol (TENORMIN) 25 MG tablet Take 25 mg by mouth every morning.     . Brexpiprazole (REXULTI) 1 MG TABS 1 mg daily.    . Calcium-Magnesium-Vitamin D (CALCIUM 500 PO) Take 1 tablet by mouth daily.    . Cholecalciferol (VITAMIN D-3) 1000 UNITS CAPS Take 1,000 capsules by mouth daily.    . Cyanocobalamin (VITAMIN B 12 PO) Take 1 tablet by mouth daily.    . DULoxetine (CYMBALTA) 60 MG capsule Take 60 mg by mouth 2 (two) times daily.     Marland Kitchen EPIPEN 2-PAK 0.3 MG/0.3ML SOAJ injection Inject 0.3 mg into the skin as needed (allergic reactions).     Marland Kitchen etodolac (LODINE) 400 MG tablet Take 400 mg by mouth 2 (two) times daily.     . ferrous sulfate 325 (65 FE) MG tablet Take 325 mg by mouth daily with breakfast.    . Flaxseed, Linseed, (FLAX SEEDS PO) Take 1 tablet by mouth daily.    . fluticasone (FLONASE) 50 MCG/ACT nasal spray Place 2 sprays into the nose daily.      . hydrOXYzine (VISTARIL) 50 MG capsule Take 100 mg by mouth daily.    Marland Kitchen levocetirizine  (XYZAL) 5 MG tablet Take 5 mg by mouth daily.    Marland Kitchen lidocaine (LIDODERM) 5 % Place 1 patch onto the skin daily. Remove & Discard patch after 12 hours. Wait 12 hours, and then reapply a new patch. 60 patch 0  . Omega-3 Fatty Acids (FISH OIL PO) Take 2 capsules by mouth daily. Reported on 09/26/2015    . omeprazole (PRILOSEC) 20 MG capsule TAKE ONE  CAPSULE BY MOUTH EVERY DAY 30 capsule 11  . oxyCODONE (ROXICODONE) 5 MG immediate release tablet Take 1 tablet (5 mg total) by mouth every 4 (four) hours as needed for severe pain. 25 tablet 0  . PROAIR HFA 108 (90 BASE) MCG/ACT inhaler Inhale 2 puffs into the lungs every 6 (six) hours as needed for wheezing or shortness of breath.     . pyridOXINE (VITAMIN B-6) 100 MG tablet Take 100 mg by mouth daily.    Marland Kitchen tiZANidine (ZANAFLEX) 4 MG tablet Take 4 mg by mouth 2 (two) times daily as needed for muscle spasms.     . traMADol (ULTRAM) 50 MG tablet TAKE (1) TABLET BY MOUTH EVERY SIX HOURS AS NEEDED FOR MODERATE OR SEVERE PAIN. (Patient not taking: Reported on 02/18/2017) 36 tablet 0   No current facility-administered medications for this visit.        Physical Exam Blood pressure 107/66, pulse 76, temperature 100 F (37.8 C). Physical Exam The patient is well developed well nourished and well groomed.  Orientation to person place and time is normal  Mood is pleasant.  Ambulatory statusRemarkable for supportive gait   Ortho Exam  Right Ankle examination: Inspection reveals tenderness and swelling over the midshaft fibula. The medial malleolus is nontender  Range of motion is limited by pain foot is plantigrade. Ankle stability tests were deferred because of pain in the ankle does not appear to be subluxated. Motor exam shows no atrophy  Skin shows mild bruising and ecchymosis. Neurovascular exam is intact.  Opposite ankle shows no deformity.    Data Reviewed  I reviewed the x-ray and independently interpreted as 3 views of the ankle and there  is a non- displaced lateral fibular shaft fracture with intact ankle mortise   Assessment    Ankle fracture:  Encounter Diagnosis  Name Primary?  . Other closed fracture of shaft of right fibula, initial encounter Yes       Plan    Cam Walker repeat x-ray next visit

## 2017-03-02 DIAGNOSIS — M545 Low back pain: Secondary | ICD-10-CM | POA: Diagnosis not present

## 2017-03-02 DIAGNOSIS — M79661 Pain in right lower leg: Secondary | ICD-10-CM | POA: Diagnosis not present

## 2017-03-02 DIAGNOSIS — R1084 Generalized abdominal pain: Secondary | ICD-10-CM | POA: Diagnosis not present

## 2017-03-02 DIAGNOSIS — G894 Chronic pain syndrome: Secondary | ICD-10-CM | POA: Diagnosis not present

## 2017-03-03 DIAGNOSIS — Z5181 Encounter for therapeutic drug level monitoring: Secondary | ICD-10-CM | POA: Diagnosis not present

## 2017-03-03 DIAGNOSIS — Z79899 Other long term (current) drug therapy: Secondary | ICD-10-CM | POA: Diagnosis not present

## 2017-03-14 ENCOUNTER — Ambulatory Visit: Payer: Self-pay | Admitting: Gastroenterology

## 2017-03-22 ENCOUNTER — Ambulatory Visit (INDEPENDENT_AMBULATORY_CARE_PROVIDER_SITE_OTHER): Payer: Medicare Other | Admitting: Gastroenterology

## 2017-03-22 ENCOUNTER — Encounter: Payer: Self-pay | Admitting: Gastroenterology

## 2017-03-22 ENCOUNTER — Other Ambulatory Visit: Payer: Self-pay

## 2017-03-22 VITALS — BP 144/90 | HR 75 | Temp 98.4°F | Ht 65.0 in | Wt 147.8 lb

## 2017-03-22 DIAGNOSIS — K7030 Alcoholic cirrhosis of liver without ascites: Secondary | ICD-10-CM | POA: Diagnosis not present

## 2017-03-22 DIAGNOSIS — I8500 Esophageal varices without bleeding: Secondary | ICD-10-CM

## 2017-03-22 NOTE — Progress Notes (Signed)
Referring Provider: Rosita Fire, MD Primary Care Physician:  Rosita Fire, MD Primary GI: Dr. Gala Romney   Chief Complaint  Patient presents with  . Cirrhosis    f/u, doing ok    HPI:   Sabrina Wyatt is a 66 y.o. female presenting today with a history of ETOH/HCV cirrhosis. Treated for Hep C in the past. [place] up-to-date.   Broke her fibula. In a boot now. Chronic abdominal wall pain. Wears brace during the day. PT helps with discomfort. Does scar tissue massage. Lidoderm patches not covered by Medicaid/Medicare. No overt GI bleeding. Due for EGD for variceal screening now   Past Medical History:  Diagnosis Date  . Abdominal wall pain    chronic  . Arthritis    rheumatoid  . Biliary stone   . Carpal tunnel syndrome   . Chronic abdominal pain   . Chronic flank pain   . Chronic low back pain   . Cirrhosis, hepatitis C    U/S on 03/09/16= no HCC, pt has been vaccinated for Hep A and Hep B. s/p treatment of HCV with eradication  . Depression   . GERD (gastroesophageal reflux disease)   . Gonorrhea   . Hypertension   . 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 Intussusception ([place]) 04/2012  . Polysubstance abuse    HX of  . PTSD (post-traumatic stress disorder)   . Pulmonary nodules   . S/P colonoscopy 2005   Dr Moss Mc polyp removed, otherwise normal  . S/P endoscopy 08/27/10   antral erosions, otherwise normal, due egd 08/2012 to screen for varices  . Seizures ([place]) 1998   with brain bleed x1. None since then and on no meds  . Shortness of breath   . Tobacco abuse     Past Surgical History:  Procedure Laterality Date  . biliary stone removal  2015  . BIOPSY N/A 03/06/2015   Procedure: BIOPSY;  Surgeon: Daneil Dolin, MD;  Location: [facility];  Service: Endoscopy;  Laterality: N/A;  . CARPAL TUNNEL RELEASE Bilateral 12/2010  . CATARACT EXTRACTION W/PHACO Right 01/15/2016   Procedure: CATARACT EXTRACTION PHACO AND INTRAOCULAR LENS PLACEMENT RIGHT EYE  CDE=5.88;  Surgeon: Tonny Branch, MD;  Location: [facility];  Service: Ophthalmology;  Laterality: Right;  . CATARACT EXTRACTION W/PHACO Left 02/12/2016   Procedure: CATARACT EXTRACTION PHACO AND INTRAOCULAR LENS PLACEMENT LEFT EYE; CDE:  6.02;  Surgeon: Tonny Branch, MD;  Location: [facility];  Service: Ophthalmology;  Laterality: Left;  . CHOLECYSTECTOMY  2002  . COLONOSCOPY    09/10/2003   diminutive polyp in the rectum cold biopsied/removed/ Normal colon  . COLONOSCOPY WITH PROPOFOL N/A 08/02/2013   GDJ:MEQASTM diverticulosis. next TCS 10 years.  . ESOPHAGOGASTRODUODENOSCOPY    09/10/2003   Small hiatal hernia; otherwise normal stomach, normal D1 and D2  . ESOPHAGOGASTRODUODENOSCOPY  08/27/2010   HDQ:QIWLNL esophagus.  No varices.  Couple of tiny antral erosions of doubtful clinical significance, otherwise normal stomach, D1 and D2.  . ESOPHAGOGASTRODUODENOSCOPY (EGD) WITH PROPOFOL N/A 08/02/2013   RMR: Portal gastropathy. No explanation for abdominal pain which I believe is more abdominal wall in origin. She has been seen by Dr. Carlis Abbott  over at [facility]. She is up-to-date on cross sectional imaging. She has a pain management physician in [place]. Repeat EGD for varices in 3 years.  . ESOPHAGOGASTRODUODENOSCOPY (EGD) WITH PROPOFOL N/A 03/06/2015   GXQ:JJHERDEY gastric mucosac s/p bx  . LUMBAR FUSION    . NOSE SURGERY  [place] AND BILLIARY DRAINAGE  2002  . REPAIR VAGINAL CUFF N/A 10/25/2015   Procedure: REPAIR VAGINAL LACERATION ;  Surgeon: Jonnie Kind, MD;  Location: [facility];  Service: Gynecology;  Laterality: N/A;  . Roux-en-y-hepatojejunostomy  2003   revision in 2013 for intussusception [facility] Dr. Bailey Mech)  . SPINAL FUSION     C5-C7    Current Outpatient Prescriptions  Medication Sig Dispense Refill  . acyclovir (ZOVIRAX) 400 MG tablet Take 1 tablet (400 mg total) by mouth 2 (two) times daily. 180 tablet 3  . albuterol (PROVENTIL) (2.5  MG/3ML) 0.083% nebulizer solution Take 2.5 mg by nebulization every 6 (six) hours as needed for wheezing or shortness of breath.     . ALPRAZolam (XANAX) 0.5 MG tablet Take 0.5 mg by mouth at bedtime.    Marland Kitchen atenolol (TENORMIN) 25 MG tablet Take 25 mg by mouth every morning.     . Brexpiprazole (REXULTI) 1 MG TABS 1 mg daily.    . Calcium-Magnesium-Vitamin D (CALCIUM 500 PO) Take 1 tablet by mouth daily.    . Cholecalciferol (VITAMIN D-3) 1000 UNITS CAPS Take 1,000 capsules by mouth daily.    . Cyanocobalamin (VITAMIN B 12 PO) Take 1 tablet by mouth daily.    . DULoxetine (CYMBALTA) 60 MG capsule Take 60 mg by mouth 2 (two) times daily.     Marland Kitchen EPIPEN 2-PAK 0.3 MG/0.3ML SOAJ injection Inject 0.3 mg into the skin as needed (allergic reactions).     Marland Kitchen etodolac (LODINE) 400 MG tablet Take 400 mg by mouth 2 (two) times daily.     . ferrous sulfate 325 (65 FE) MG tablet Take 325 mg by mouth daily with breakfast.    . Flaxseed, Linseed, (FLAX SEEDS PO) Take 1 tablet by mouth daily.    . fluticasone (FLONASE) 50 MCG/ACT nasal spray Place 2 sprays into the nose daily.      . hydrOXYzine (VISTARIL) 50 MG capsule Take 100 mg by mouth daily.    Marland Kitchen levocetirizine (XYZAL) 5 MG tablet Take 5 mg by mouth daily.    Marland Kitchen lidocaine (LIDODERM) 5 % Place 1 patch onto the skin daily. Remove & Discard patch after 12 hours. Wait 12 hours, and then reapply a new patch. 60 patch 0  . Omega-3 Fatty Acids (FISH OIL PO) Take 2 capsules by mouth daily. Reported on 09/26/2015    . omeprazole (PRILOSEC) 20 MG capsule TAKE ONE CAPSULE BY MOUTH EVERY DAY 30 capsule 11  . oxyCODONE (ROXICODONE) 5 MG immediate release tablet Take 1 tablet (5 mg total) by mouth every 4 (four) hours as needed for severe pain. 25 tablet 0  . PROAIR HFA 108 (90 BASE) MCG/ACT inhaler Inhale 2 puffs into the lungs every 6 (six) hours as needed for wheezing or shortness of breath.     . pyridOXINE (VITAMIN B-6) 100 MG tablet Take 100 mg by mouth daily.    Marland Kitchen  tiZANidine (ZANAFLEX) 4 MG tablet Take 4 mg by mouth 2 (two) times daily as needed for muscle spasms.     . traMADol (ULTRAM) 50 MG tablet TAKE (1) TABLET BY MOUTH EVERY SIX HOURS AS NEEDED FOR MODERATE OR SEVERE PAIN. (Patient not taking: Reported on 02/18/2017) 36 tablet 0   No current facility-administered medications for this visit.     Allergies as of 03/22/2017 - Review Complete 03/22/2017  Allergen Reaction Noted  . Asa [aspirin] Nausea Only 04/13/2016  . Bee venom Hives 07/10/2015  .  Codeine  02/18/2017  . Tylenol [acetaminophen] Nausea And Vomiting and Swelling 07/10/2015    Family History  Problem Relation Age of Onset  . Coronary artery disease Brother   . Cancer Sister        lymphoma  . Cancer Father        brain    Social History   Social History  . Marital status: Single    Spouse name: N/A  . Number of children: 1  . Years of education: N/A   Occupational History  . disabled Unemployed   Social History Main Topics  . Smoking status: Current Some Day Smoker    Packs/day: 0.25    Years: 33.00    Types: Cigarettes  . Smokeless tobacco: Never Used     Comment: One pack every 6-7 days/ she is working on quitting  . Alcohol use 0.0 oz/week     Comment: occasionally   . Drug use: No  . Sexual activity: Not Currently    Partners: Male    Birth control/ protection: Post-menopausal, Condom   Other Topics Concern  . None   Social History Narrative  . None    Review of Systems: As mentioned in HPI   Physical Exam: BP (!) 144/90   Pulse 75   Temp 98.4 F (36.9 C) (Oral)   Ht 5\' 5"  (1.651 m)   Wt 147 lb 12.8 oz (67 kg)   BMI 24.60 kg/m  General:   Alert and oriented. No distress noted. Pleasant and cooperative.  Head:  Normocephalic and atraumatic. Eyes:  Conjuctiva clear without scleral icterus. Mouth:  Oral mucosa pink and moist. Good dentition. No lesions. Heart:  S1, S2 present without murmurs, rubs, or gallops. Regular rate and  rhythm. Abdomen:  +BS, soft, non-tender and non-distended. No rebound or guarding. No HSM or masses noted. Msk:  Symmetrical without gross deformities. Normal posture. Extremities:  Without edema. Neurologic:  Alert and  oriented x4;  grossly normal neurologically. Psych:  Alert and cooperative. Normal mood and affect.  Lab Results  Component Value Date   WBC 9.6 09/03/2016   HGB 13.4 09/03/2016   HCT 40.5 09/03/2016   MCV 94.4 09/03/2016   PLT 136 (L) 09/03/2016   Lab Results  Component Value Date   ALT 19 09/03/2016   AST 32 09/03/2016   ALKPHOS 104 09/03/2016   BILITOT 0.9 09/03/2016   Lab Results  Component Value Date   CREATININE 0.87 09/03/2016   BUN 15 09/03/2016   NA 137 09/03/2016   K 4.1 09/03/2016   CL 103 09/03/2016   CO2 28 09/03/2016

## 2017-03-22 NOTE — Patient Instructions (Signed)
Please have blood work done today.   We have scheduled you for an upper endoscopy for variceal screening in the near future.  We will see you again in 6 months!

## 2017-03-27 NOTE — Assessment & Plan Note (Signed)
66 year old female with history of ETOH/HCV cirrhosis, treated previously for Hep C. Well-compensated at this time. Chronic abdominal wall pain at baseline. No concerning signs. Due for routine EGD for variceal screening now.   Proceed with upper endoscopy in the near future with Dr. Gala Romney. The risks, benefits, and alternatives have been discussed in detail with patient. They have stated understanding and desire to proceed.  PROPOFOL due to polypharmacy Routine labs today [place] in 6 months

## 2017-03-28 NOTE — Progress Notes (Signed)
CC'D TO PCP °

## 2017-03-30 DIAGNOSIS — M79661 Pain in right lower leg: Secondary | ICD-10-CM | POA: Diagnosis not present

## 2017-03-30 DIAGNOSIS — G894 Chronic pain syndrome: Secondary | ICD-10-CM | POA: Diagnosis not present

## 2017-03-30 DIAGNOSIS — R1084 Generalized abdominal pain: Secondary | ICD-10-CM | POA: Diagnosis not present

## 2017-03-30 DIAGNOSIS — M545 Low back pain: Secondary | ICD-10-CM | POA: Diagnosis not present

## 2017-03-30 LAB — PROTIME-INR
INR: 1
Prothrombin Time: 10.7 s (ref 9.0–11.5)

## 2017-03-30 LAB — CBC WITH DIFFERENTIAL/PLATELET
Basophils Absolute: 0 cells/uL (ref 0–200)
Basophils Relative: 0 %
Eosinophils Absolute: 156 cells/uL (ref 15–500)
Eosinophils Relative: 2 %
HCT: 41.6 % (ref 35.0–45.0)
Hemoglobin: 13.8 g/dL (ref 11.7–15.5)
Lymphocytes Relative: 34 %
Lymphs Abs: 2652 cells/uL (ref 850–3900)
MCH: 30.3 pg (ref 27.0–33.0)
MCHC: 33.2 g/dL (ref 32.0–36.0)
MCV: 91.2 fL (ref 80.0–100.0)
MPV: 11.9 fL (ref 7.5–12.5)
Monocytes Absolute: 468 cells/uL (ref 200–950)
Monocytes Relative: 6 %
Neutro Abs: 4524 cells/uL (ref 1500–7800)
Neutrophils Relative %: 58 %
Platelets: 137 10*3/uL — ABNORMAL LOW (ref 140–400)
RBC: 4.56 MIL/uL (ref 3.80–5.10)
RDW: 14.1 % (ref 11.0–15.0)
WBC: 7.8 10*3/uL (ref 3.8–10.8)

## 2017-03-30 LAB — COMPLETE METABOLIC PANEL WITH GFR
ALT: 26 U/L (ref 6–29)
AST: 27 U/L (ref 10–35)
Albumin: 4.4 g/dL (ref 3.6–5.1)
Alkaline Phosphatase: 96 U/L (ref 33–130)
BUN: 9 mg/dL (ref 7–25)
CO2: 24 mmol/L (ref 20–32)
Calcium: 9.5 mg/dL (ref 8.6–10.4)
Chloride: 107 mmol/L (ref 98–110)
Creat: 0.72 mg/dL (ref 0.50–0.99)
GFR, Est African American: >89 mL/min (ref >=60)
GFR, Est Non African American: 88 mL/min (ref >=60)
Glucose, Bld: 90 mg/dL (ref 65–99)
Potassium: 3.9 mmol/L (ref 3.5–5.3)
Sodium: 142 mmol/L (ref 135–146)
Total Bilirubin: 0.6 mg/dL (ref 0.2–1.2)
Total Protein: 7 g/dL (ref 6.1–8.1)

## 2017-03-30 LAB — AFP TUMOR MARKER: AFP-Tumor Marker: 3.9 ng/mL (ref <6.1)

## 2017-03-31 ENCOUNTER — Other Ambulatory Visit: Payer: Self-pay | Admitting: Radiology

## 2017-03-31 DIAGNOSIS — S82491G Other fracture of shaft of right fibula, subsequent encounter for closed fracture with delayed healing: Secondary | ICD-10-CM

## 2017-04-01 ENCOUNTER — Ambulatory Visit (INDEPENDENT_AMBULATORY_CARE_PROVIDER_SITE_OTHER): Payer: Medicare Other

## 2017-04-01 ENCOUNTER — Encounter: Payer: Self-pay | Admitting: Orthopedic Surgery

## 2017-04-01 ENCOUNTER — Ambulatory Visit (INDEPENDENT_AMBULATORY_CARE_PROVIDER_SITE_OTHER): Payer: Medicare Other | Admitting: Orthopedic Surgery

## 2017-04-01 DIAGNOSIS — S82491D Other fracture of shaft of right fibula, subsequent encounter for closed fracture with routine healing: Secondary | ICD-10-CM

## 2017-04-01 DIAGNOSIS — S82491G Other fracture of shaft of right fibula, subsequent encounter for closed fracture with delayed healing: Secondary | ICD-10-CM

## 2017-04-01 NOTE — Progress Notes (Signed)
Fracture care follow-up  Chief Complaint  Patient presents with  . Fracture    FOLLOW UP 02/15/17 Full weight in cam boot    Post injury day number  45  Fracture treated with  Cam Walker  X-rays right ankle fibular fracture with abundant callus proximal and distal to the fracture    there are no complaints of pain    Current Outpatient Prescriptions:  .  acyclovir (ZOVIRAX) 400 MG tablet, Take 1 tablet (400 mg total) by mouth 2 (two) times daily., Disp: 180 tablet, Rfl: 3 .  albuterol (PROVENTIL) (2.5 MG/3ML) 0.083% nebulizer solution, Take 2.5 mg by nebulization 2 (two) times daily as needed for wheezing or shortness of breath. Uses it average 3 times week, Disp: , Rfl:  .  ALPRAZolam (XANAX) 1 MG tablet, Take 1 mg by mouth at bedtime. , Disp: , Rfl:  .  atenolol (TENORMIN) 25 MG tablet, Take 25 mg by mouth every morning. , Disp: , Rfl:  .  Brexpiprazole (REXULTI) 1 MG TABS, Take 1 mg by mouth daily. , Disp: , Rfl:  .  Calcium-Magnesium-Vitamin D (CALCIUM 500 PO), Take 1 tablet by mouth daily., Disp: , Rfl:  .  Cholecalciferol (VITAMIN D-3) 1000 UNITS CAPS, Take 1,000 Units by mouth daily. , Disp: , Rfl:  .  Cyanocobalamin (VITAMIN B 12 PO), Take 1 tablet by mouth daily., Disp: , Rfl:  .  DULoxetine (CYMBALTA) 60 MG capsule, Take 60 mg by mouth 2 (two) times daily. , Disp: , Rfl:  .  EPIPEN 2-PAK 0.3 MG/0.3ML SOAJ injection, Inject 0.3 mg into the skin as needed (allergic reactions). , Disp: , Rfl:  .  etodolac (LODINE) 400 MG tablet, Take 400 mg by mouth 2 (two) times daily. , Disp: , Rfl:  .  ferrous sulfate 325 (65 FE) MG tablet, Take 325 mg by mouth daily with breakfast., Disp: , Rfl:  .  Flaxseed, Linseed, (FLAX SEEDS PO), Take 1 tablet by mouth daily., Disp: , Rfl:  .  fluticasone (FLONASE) 50 MCG/ACT nasal spray, Place 2 sprays into the nose daily.  , Disp: , Rfl:  .  gentamicin (GARAMYCIN) 0.3 % ophthalmic solution, Place 1 drop into both eyes as needed (dry eyes). , Disp: ,  Rfl:  .  hydrOXYzine (VISTARIL) 50 MG capsule, Take 50 mg by mouth 2 (two) times daily. , Disp: , Rfl:  .  levocetirizine (XYZAL) 5 MG tablet, Take 5 mg by mouth daily as needed. , Disp: , Rfl:  .  loratadine (CLARITIN) 10 MG tablet, Take 10 mg by mouth daily as needed for allergies., Disp: , Rfl:  .  Omega-3 Fatty Acids (FISH OIL) 1000 MG CAPS, Take 1,000 mg by mouth 2 (two) times daily., Disp: , Rfl:  .  omeprazole (PRILOSEC) 20 MG capsule, TAKE ONE CAPSULE BY MOUTH EVERY DAY, Disp: 30 capsule, Rfl: 11 .  Oxycodone HCl 10 MG TABS, Take 10 mg by mouth 3 (three) times daily., Disp: , Rfl:  .  PROAIR HFA 108 (90 BASE) MCG/ACT inhaler, Inhale 2 puffs into the lungs every 6 (six) hours as needed for wheezing or shortness of breath. , Disp: , Rfl:  .  promethazine (PHENERGAN) 25 MG tablet, Take 25 mg by mouth every 6 (six) hours as needed for nausea or vomiting. , Disp: , Rfl:  .  pyridOXINE (VITAMIN B-6) 100 MG tablet, Take 100 mg by mouth daily., Disp: , Rfl:  .  tiZANidine (ZANAFLEX) 2 MG tablet, Take 4 mg by  mouth 2 (two) times daily as needed., Disp: , Rfl:  .  oxyCODONE (ROXICODONE) 5 MG immediate release tablet, Take 1 tablet (5 mg total) by mouth every 4 (four) hours as needed for severe pain. (Patient not taking: Reported on 03/31/2017), Disp: 25 tablet, Rfl: 0   Clinical exam no tenderness on exam Encounter Diagnosis  Name Primary?  . Other closed fracture of shaft of right fibula with routine healing, subsequent encounter Yes    Plan  Recommend return to normal activity follow-up as needed

## 2017-04-05 ENCOUNTER — Encounter (HOSPITAL_COMMUNITY): Payer: Self-pay

## 2017-04-05 ENCOUNTER — Encounter (HOSPITAL_COMMUNITY)
Admission: RE | Admit: 2017-04-05 | Discharge: 2017-04-05 | Disposition: A | Discharge status: Home / Self Care | Payer: Medicare Other | Source: Ambulatory Visit | Attending: Internal Medicine | Admitting: Internal Medicine

## 2017-04-05 HISTORY — DX: Anemia, unspecified: D64.9

## 2017-04-06 ENCOUNTER — Other Ambulatory Visit (HOSPITAL_COMMUNITY): Payer: Self-pay

## 2017-04-08 ENCOUNTER — Telehealth: Payer: Self-pay

## 2017-04-08 NOTE — Telephone Encounter (Signed)
Tried to call with no answer  

## 2017-04-11 ENCOUNTER — Telehealth: Payer: Self-pay

## 2017-04-11 ENCOUNTER — Other Ambulatory Visit: Payer: Medicare Other | Admitting: Obstetrics and Gynecology

## 2017-04-11 MED ORDER — PROPOFOL 10 MG/ML IV BOLUS
INTRAVENOUS | Status: AC
  Filled 2017-04-11: qty 40

## 2017-04-11 NOTE — Telephone Encounter (Signed)
Pt called office to cancel EGD with RMR for today, her ride canceled. EGD w/Propofol rescheduled to 04/18/17 at 2:30pm. She had already called the hospital. New instructions given on phone and mailed to pt. Called Endo Scheduler and informed to move order to new date/time.

## 2017-04-12 NOTE — Patient Instructions (Signed)
Sabrina Wyatt  04/12/2017     @PREFPERIOPPHARMACY @   Your procedure is scheduled on  04/18/2017   Report to [facility] at  1300 P.M.  Call this number if you have problems the morning of surgery:  [phone]   Remember:  Do not eat food or drink liquids after midnight.  Take these medicines the morning of surgery with A SIP OF WATER  Atenolol, rexulti, cumbalta, vistaril, claritin, prilosec, oxycodone, phenergan,zanaflex. Use your nebulizer and your proair before you come.   Do not wear jewelry, make-up or nail polish.  Do not wear lotions, powders, or perfumes, or deoderant.  Do not shave 48 hours prior to surgery.  Men may shave face and neck.  Do not bring valuables to the hospital.  [facility] is not responsible for any belongings or valuables.  Contacts, dentures or bridgework may not be worn into surgery.  Leave your suitcase in the car.  After surgery it may be brought to your room.  For patients admitted to the hospital, discharge time will be determined by your treatment team.  Patients discharged the day of surgery will not be allowed to drive home.   Name and phone number of your driver:   family Special instructions:  Follow the diet instructions given to you by Dr Roseanne Kaufman office.  Please read over the following fact sheets that you were given. Anesthesia Post-op Instructions and Care and Recovery After Surgery       Esophagogastroduodenoscopy Esophagogastroduodenoscopy (EGD) is a procedure to examine the lining of the esophagus, stomach, and first part of the small intestine (duodenum). This procedure is done to check for problems such as inflammation, bleeding, ulcers, or growths. During this procedure, a long, flexible, lighted tube with a camera attached (endoscope) is inserted down the throat. Tell a health care provider about:  Any allergies you have.  All medicines you are taking, including vitamins, herbs, eye drops, creams, and  over-the-counter medicines.  Any problems you or family members have had with anesthetic medicines.  Any blood disorders you have.  Any surgeries you have had.  Any medical conditions you have.  Whether you are pregnant or may be pregnant. What are the risks? Generally, this is a safe procedure. However, problems may occur, including:  Infection.  Bleeding.  A tear (perforation) in the esophagus, stomach, or duodenum.  Trouble breathing.  Excessive sweating.  Spasms of the larynx.  A slowed heartbeat.  Low blood pressure.  What happens before the procedure?  Follow instructions from your health care provider about eating or drinking restrictions.  Ask your health care provider about: ? Changing or stopping your regular medicines. This is especially important if you are taking diabetes medicines or blood thinners. ? Taking medicines such as aspirin and ibuprofen. These medicines can thin your blood. Do not take these medicines before your procedure if your health care provider instructs you not to.  Plan to have someone take you home after the procedure.  If you wear dentures, be ready to remove them before the procedure. What happens during the procedure?  To reduce your risk of infection, your health care team will wash or sanitize their hands.  An IV tube will be put in a vein in your hand or arm. You will get medicines and fluids through this tube.  You will be given one or more of the following: ? A medicine to help you relax (sedative). ? A  medicine to numb the area (local anesthetic). This medicine may be sprayed into your throat. It will make you feel more comfortable and keep you from gagging or coughing during the procedure. ? A medicine for pain.  A mouth guard may be placed in your mouth to protect your teeth and to keep you from biting on the endoscope.  You will be asked to lie on your left side.  The endoscope will be lowered down your throat into  your esophagus, stomach, and duodenum.  Air will be put into the endoscope. This will help your health care provider see better.  The lining of your esophagus, stomach, and duodenum will be examined.  Your health care provider may: ? Take a tissue sample so it can be looked at in a lab (biopsy). ? Remove growths. ? Remove objects (foreign bodies) that are stuck. ? Treat any bleeding with medicines or other devices that stop tissue from bleeding. ? Widen (dilate) or stretch narrowed areas of your esophagus and stomach.  The endoscope will be taken out. The procedure may vary among health care providers and hospitals. What happens after the procedure?  Your blood pressure, heart rate, breathing rate, and blood oxygen level will be monitored often until the medicines you were given have worn off.  Do not eat or drink anything until the numbing medicine has worn off and your gag reflex has returned. This information is not intended to replace advice given to you by your health care provider. Make sure you discuss any questions you have with your health care provider. Document Released: 12/10/2004 Document Revised: 01/15/2016 Document Reviewed: 07/03/2015 Elsevier Interactive Patient Education  2018 [employer]. Esophagogastroduodenoscopy, Care After Refer to this sheet in the next few weeks. These instructions provide you with information about caring for yourself after your procedure. Your health care provider may also give you more specific instructions. Your treatment has been planned according to current medical practices, but problems sometimes occur. Call your health care provider if you have any problems or questions after your procedure. What can I expect after the procedure? After the procedure, it is common to have:  A sore throat.  Nausea.  Bloating.  Dizziness.  Fatigue.  Follow these instructions at home:  Do not eat or drink anything until the numbing medicine  (local anesthetic) has worn off and your gag reflex has returned. You will know that the local anesthetic has worn off when you can swallow comfortably.  Do not drive for 24 hours if you received a medicine to help you relax (sedative).  If your health care provider took a tissue sample for testing during the procedure, make sure to get your test results. This is your responsibility. Ask your health care provider or the department performing the test when your results will be ready.  Keep all follow-up visits as told by your health care provider. This is important. Contact a health care provider if:  You cannot stop coughing.  You are not urinating.  You are urinating less than usual. Get help right away if:  You have trouble swallowing.  You cannot eat or drink.  You have throat or chest pain that gets worse.  You are dizzy or light-headed.  You faint.  You have nausea or vomiting.  You have chills.  You have a fever.  You have severe abdominal pain.  You have black, tarry, or bloody stools. This information is not intended to replace advice given to you by your health care  provider. Make sure you discuss any questions you have with your health care provider. Document Released: 07/26/2012 Document Revised: 01/15/2016 Document Reviewed: 07/03/2015 Elsevier Interactive Patient Education  2018 [place] Anesthesia, Adult General anesthesia is the use of medicines to make a person "go to sleep" (be unconscious) for a medical procedure. General anesthesia is often recommended when a procedure:  Is long.  Requires you to be still or in an unusual position.  Is major and can cause you to lose blood.  Is impossible to do without general anesthesia.  The medicines used for general anesthesia are called general anesthetics. In addition to making you sleep, the medicines:  Prevent pain.  Control your blood pressure.  Relax your muscles.  Tell a health  care provider about:  Any allergies you have.  All medicines you are taking, including vitamins, herbs, eye drops, creams, and over-the-counter medicines.  Any problems you or family members have had with anesthetic medicines.  Types of anesthetics you have had in the past.  Any bleeding disorders you have.  Any surgeries you have had.  Any medical conditions you have.  Any history of heart or lung conditions, such as heart failure, sleep apnea, or chronic obstructive pulmonary disease (COPD).  Whether you are pregnant or may be pregnant.  Whether you use tobacco, alcohol, marijuana, or street drugs.  Any history of Armed forces logistics/support/administrative officer.  Any history of depression or anxiety. What are the risks? Generally, this is a safe procedure. However, problems may occur, including:  Allergic reaction to anesthetics.  Lung and heart problems.  Inhaling food or liquids from your stomach into your lungs (aspiration).  Injury to nerves.  Waking up during your procedure and being unable to move (rare).  Extreme agitation or a state of mental confusion (delirium) when you wake up from the anesthetic.  Air in the bloodstream, which can lead to stroke.  These problems are more likely to develop if you are having a major surgery or if you have an advanced medical condition. You can prevent some of these complications by answering all of your health care provider's questions thoroughly and by following all pre-procedure instructions. General anesthesia can cause side effects, including:  Nausea or vomiting  A sore throat from the breathing tube.  Feeling cold or shivery.  Feeling tired, washed out, or achy.  Sleepiness or drowsiness.  Confusion or agitation.  What happens before the procedure? Staying hydrated Follow instructions from your health care provider about hydration, which may include:  Up to 2 hours before the procedure - you may continue to drink clear liquids, such as  water, clear fruit juice, black coffee, and plain tea.  Eating and drinking restrictions Follow instructions from your health care provider about eating and drinking, which may include:  8 hours before the procedure - stop eating heavy meals or foods such as meat, fried foods, or fatty foods.  6 hours before the procedure - stop eating light meals or foods, such as toast or cereal.  6 hours before the procedure - stop drinking milk or drinks that contain milk.  2 hours before the procedure - stop drinking clear liquids.  Medicines  Ask your health care provider about: ? Changing or stopping your regular medicines. This is especially important if you are taking diabetes medicines or blood thinners. ? Taking medicines such as aspirin and ibuprofen. These medicines can thin your blood. Do not take these medicines before your procedure if your health care provider instructs  you not to. ? Taking new dietary supplements or medicines. Do not take these during the week before your procedure unless your health care provider approves them.  If you are told to take a medicine or to continue taking a medicine on the day of the procedure, take the medicine with sips of water. General instructions   Ask if you will be going home the same day, the following day, or after a longer hospital stay. ? Plan to have someone take you home. ? Plan to have someone stay with you for the first 24 hours after you leave the hospital or clinic.  For 3-6 weeks before the procedure, try not to use any tobacco products, such as cigarettes, chewing tobacco, and e-cigarettes.  You may brush your teeth on the morning of the procedure, but make sure to spit out the toothpaste. What happens during the procedure?  You will be given anesthetics through a mask and through an IV tube in one of your veins.  You may receive medicine to help you relax (sedative).  As soon as you are asleep, a breathing tube may be used to  help you breathe.  An anesthesia specialist will stay with you throughout the procedure. He or she will help keep you comfortable and safe by continuing to give you medicines and adjusting the amount of medicine that you get. He or she will also watch your blood pressure, pulse, and oxygen levels to make sure that the anesthetics do not cause any problems.  If a breathing tube was used to help you breathe, it will be removed before you wake up. The procedure may vary among health care providers and hospitals. What happens after the procedure?  You will wake up, often slowly, after the procedure is complete, usually in a recovery area.  Your blood pressure, heart rate, breathing rate, and blood oxygen level will be monitored until the medicines you were given have worn off.  You may be given medicine to help you calm down if you feel anxious or agitated.  If you will be going home the same day, your health care provider may check to make sure you can stand, drink, and urinate.  Your health care providers will treat your pain and side effects before you go home.  Do not drive for 24 hours if you received a sedative.  You may: ? Feel nauseous and vomit. ? Have a sore throat. ? Have mental slowness. ? Feel cold or shivery. ? Feel sleepy. ? Feel tired. ? Feel sore or achy, even in parts of your body where you did not have surgery. This information is not intended to replace advice given to you by your health care provider. Make sure you discuss any questions you have with your health care provider. Document Released: 11/16/2007 Document Revised: 01/20/2016 Document Reviewed: 07/24/2015 Elsevier Interactive Patient Education  2018 [place], Care After These instructions provide you with information about caring for yourself after your procedure. Your health care provider may also give you more specific instructions. Your treatment has been planned according  to current medical practices, but problems sometimes occur. Call your health care provider if you have any problems or questions after your procedure. What can I expect after the procedure? After your procedure, it is common to:  Feel sleepy for several hours.  Feel clumsy and have poor balance for several hours.  Feel forgetful about what happened after the procedure.  Have poor judgment for several hours.  Feel nauseous or vomit.  Have a sore throat if you had a breathing tube during the procedure.  Follow these instructions at home: For at least 24 hours after the procedure:   Do not: ? Participate in activities in which you could fall or become injured. ? Drive. ? Use heavy machinery. ? Drink alcohol. ? Take sleeping pills or medicines that cause drowsiness. ? Make important decisions or sign legal documents. ? Take care of children on your own.  Rest. Eating and drinking  Follow the diet that is recommended by your health care provider.  If you vomit, drink water, juice, or soup when you can drink without vomiting.  Make sure you have little or no nausea before eating solid foods. General instructions  Have a responsible adult stay with you until you are awake and alert.  Take over-the-counter and prescription medicines only as told by your health care provider.  If you smoke, do not smoke without supervision.  Keep all follow-up visits as told by your health care provider. This is important. Contact a health care provider if:  You keep feeling nauseous or you keep vomiting.  You feel light-headed.  You develop a rash.  You have a fever. Get help right away if:  You have trouble breathing. This information is not intended to replace advice given to you by your health care provider. Make sure you discuss any questions you have with your health care provider. Document Released: 11/30/2015 Document Revised: 03/31/2016 Document Reviewed:  11/30/2015 Elsevier Interactive Patient Education  [employer].

## 2017-04-12 NOTE — Progress Notes (Signed)
CBC, CMP, INR, AFP look great. Mild thrombocytopenia in setting of chronic liver disease. MELD Na 6.

## 2017-04-13 ENCOUNTER — Encounter (HOSPITAL_COMMUNITY)
Admission: RE | Admit: 2017-04-13 | Discharge: 2017-04-13 | Disposition: A | Discharge status: Home / Self Care | Payer: Medicare Other | Source: Ambulatory Visit | Attending: Internal Medicine | Admitting: Internal Medicine

## 2017-04-14 DIAGNOSIS — Z79899 Other long term (current) drug therapy: Secondary | ICD-10-CM | POA: Diagnosis not present

## 2017-04-14 DIAGNOSIS — Z5181 Encounter for therapeutic drug level monitoring: Secondary | ICD-10-CM | POA: Diagnosis not present

## 2017-04-18 ENCOUNTER — Ambulatory Visit (HOSPITAL_COMMUNITY): Payer: Medicare Other | Admitting: Anesthesiology

## 2017-04-18 ENCOUNTER — Ambulatory Visit (HOSPITAL_COMMUNITY)
Admission: RE | Admit: 2017-04-18 | Discharge: 2017-04-18 | Disposition: A | Discharge status: Home / Self Care | Payer: Medicare Other | Source: Ambulatory Visit | Attending: Internal Medicine | Admitting: Internal Medicine

## 2017-04-18 ENCOUNTER — Encounter (HOSPITAL_COMMUNITY)
Admission: RE | Disposition: A | Discharge status: Home / Self Care | Payer: Self-pay | Source: Ambulatory Visit | Attending: Internal Medicine

## 2017-04-18 ENCOUNTER — Encounter (HOSPITAL_COMMUNITY): Payer: Self-pay | Admitting: *Deleted

## 2017-04-18 DIAGNOSIS — Z8619 Personal history of other infectious and parasitic diseases: Secondary | ICD-10-CM | POA: Insufficient documentation

## 2017-04-18 DIAGNOSIS — F17200 Nicotine dependence, unspecified, uncomplicated: Secondary | ICD-10-CM | POA: Diagnosis not present

## 2017-04-18 DIAGNOSIS — K219 Gastro-esophageal reflux disease without esophagitis: Secondary | ICD-10-CM | POA: Diagnosis not present

## 2017-04-18 DIAGNOSIS — F329 Major depressive disorder, single episode, unspecified: Secondary | ICD-10-CM | POA: Diagnosis not present

## 2017-04-18 DIAGNOSIS — I8500 Esophageal varices without bleeding: Secondary | ICD-10-CM

## 2017-04-18 DIAGNOSIS — K3189 Other diseases of stomach and duodenum: Secondary | ICD-10-CM | POA: Diagnosis not present

## 2017-04-18 DIAGNOSIS — D696 Thrombocytopenia, unspecified: Secondary | ICD-10-CM | POA: Insufficient documentation

## 2017-04-18 DIAGNOSIS — M1990 Unspecified osteoarthritis, unspecified site: Secondary | ICD-10-CM | POA: Insufficient documentation

## 2017-04-18 DIAGNOSIS — Z13810 Encounter for screening for upper gastrointestinal disorder: Secondary | ICD-10-CM

## 2017-04-18 DIAGNOSIS — K745 Biliary cirrhosis, unspecified: Secondary | ICD-10-CM | POA: Insufficient documentation

## 2017-04-18 DIAGNOSIS — D649 Anemia, unspecified: Secondary | ICD-10-CM | POA: Diagnosis not present

## 2017-04-18 DIAGNOSIS — K766 Portal hypertension: Secondary | ICD-10-CM | POA: Insufficient documentation

## 2017-04-18 DIAGNOSIS — I10 Essential (primary) hypertension: Secondary | ICD-10-CM | POA: Insufficient documentation

## 2017-04-18 DIAGNOSIS — R109 Unspecified abdominal pain: Secondary | ICD-10-CM | POA: Insufficient documentation

## 2017-04-18 DIAGNOSIS — R0602 Shortness of breath: Secondary | ICD-10-CM | POA: Diagnosis not present

## 2017-04-18 HISTORY — PX: ESOPHAGOGASTRODUODENOSCOPY (EGD) WITH PROPOFOL: SHX5813

## 2017-04-18 SURGERY — ESOPHAGOGASTRODUODENOSCOPY (EGD) WITH PROPOFOL
Anesthesia: Monitor Anesthesia Care

## 2017-04-18 MED ORDER — CHLORHEXIDINE GLUCONATE CLOTH 2 % EX PADS: 6.0000 | MEDICATED_PAD | Freq: Once | CUTANEOUS | Status: DC

## 2017-04-18 MED ORDER — LIDOCAINE VISCOUS 2 % MT SOLN
15.0000 mL | Freq: Once | OROMUCOSAL | Status: AC
  Administered 2017-04-18: 15 mL via OROMUCOSAL

## 2017-04-18 MED ORDER — MIDAZOLAM HCL 2 MG/2ML IJ SOLN
1.0000 mg | Freq: Once | INTRAMUSCULAR | Status: AC | PRN
  Administered 2017-04-18: 2 mg via INTRAVENOUS

## 2017-04-18 MED ORDER — MIDAZOLAM HCL 2 MG/2ML IJ SOLN
INTRAMUSCULAR | Status: AC
  Filled 2017-04-18: qty 2

## 2017-04-18 MED ORDER — PROPOFOL 500 MG/50ML IV EMUL
INTRAVENOUS | Status: DC | PRN
  Administered 2017-04-18: 150 ug/kg/min via INTRAVENOUS

## 2017-04-18 MED ORDER — LACTATED RINGERS IV SOLN
INTRAVENOUS | Status: DC
  Administered 2017-04-18: 12:00:00 via INTRAVENOUS

## 2017-04-18 MED ORDER — ONDANSETRON 4 MG PO TBDP
4.0000 mg | ORAL_TABLET | Freq: Once | ORAL | Status: AC
  Administered 2017-04-18: 4 mg via ORAL

## 2017-04-18 MED ORDER — ONDANSETRON 4 MG PO TBDP
ORAL_TABLET | ORAL | Status: AC
  Filled 2017-04-18: qty 1

## 2017-04-18 MED ORDER — LIDOCAINE VISCOUS 2 % MT SOLN
OROMUCOSAL | Status: AC
  Filled 2017-04-18: qty 15

## 2017-04-18 NOTE — Progress Notes (Signed)
Patient had an EGD wit propofol today and she will need supervised care for the next 24 hours.

## 2017-04-18 NOTE — H&P (View-Only) (Signed)
CBC, CMP, INR, AFP look great. Mild thrombocytopenia in setting of chronic liver disease. MELD Na 6.

## 2017-04-18 NOTE — Anesthesia Preprocedure Evaluation (Addendum)
Anesthesia Evaluation  Patient identified by MRN, date of birth, ID band Patient confused    Airway Mallampati: I  TM Distance: >3 FB Neck ROM: Full    Dental  (+) Edentulous Upper, Edentulous Lower   Pulmonary shortness of breath (deconditioning), Current Smoker,    Pulmonary exam normal breath sounds clear to auscultation       Cardiovascular Exercise Tolerance: Poor hypertension, Pt. on medications and Pt. on home beta blockers Normal cardiovascular exam Rhythm:Regular Rate:Normal  ECG  NSR   Neuro/Psych Depression    GI/Hepatic GERD  Controlled and Medicated,(+) Cirrhosis       , Hepatitis -, C  Endo/Other    Renal/GU      Musculoskeletal  (+) Arthritis , Osteoarthritis,    Abdominal (+)  Abdomen: soft.    Peds  Hematology  (+) anemia , CBC        Component                Value               Date/Time                 WBC                      7.8                 03/29/2017 1554           RBC                      4.56                03/29/2017 1554           HGB                      13.8                03/29/2017 1554           HCT                      41.6                03/29/2017 1554           PLT                      137 (L)             03/29/2017 1554           MCV                      91.2                03/29/2017 1554           MCH                      30.3                03/29/2017 1554           MCHC                     33.2                03/29/2017 1554  RDW                      14.1                03/29/2017 1554           LYMPHSABS                2,652               03/29/2017 1554           MONOABS                  468                 03/29/2017 1554           EOSABS                   156                 03/29/2017 1554           BASOSABS                 0                   03/29/2017 1554        Anesthesia Other Findings   Reproductive/Obstetrics                             Anesthesia Physical Anesthesia Plan  ASA: IV  Anesthesia Plan: MAC   Post-op Pain Management:    Induction: Intravenous  PONV Risk Score and Plan: Ondansetron and Midazolam  Airway Management Planned: Mask and Natural Airway  Additional Equipment:   Intra-op Plan:   Post-operative Plan:   Informed Consent: I have reviewed the patients History and Physical, chart, labs and discussed the procedure including the risks, benefits and alternatives for the proposed anesthesia with the patient or authorized representative who has indicated his/her understanding and acceptance.   Dental advisory given  Plan Discussed with: CRNA  Anesthesia Plan Comments:         Anesthesia Quick Evaluation

## 2017-04-18 NOTE — Op Note (Signed)
[facility] Patient Name: Sabrina Wyatt Procedure Date: 04/18/2017 12:38 PM MRN: [id number] Date of Birth: November 19, 1950 Attending MD: Norvel Richards , MD CSN: [id number] Age: 66 Admit Type: Outpatient Procedure:                Upper GI endoscopy Indications:              Screening procedure Providers:                Norvel Richards, MD, Otis Peak B. Sharon Seller, RN,                            Aram Candela Referring MD:              Medicines:                Propofol per Anesthesia Complications:            No immediate complications. Estimated Blood Loss:     Estimated blood loss: none. Procedure:                Pre-Anesthesia Assessment:                           - Prior to the procedure, a History and Physical                            was performed, and patient medications and                            allergies were reviewed. The patient's tolerance of                            previous anesthesia was also reviewed. The risks                            and benefits of the procedure and the sedation                            options and risks were discussed with the patient.                            All questions were answered, and informed consent                            was obtained. Prior Anticoagulants: The patient has                            taken no previous anticoagulant or antiplatelet                            agents. ASA Grade Assessment: II - A patient with                            mild systemic disease. After reviewing the risks  and benefits, the patient was deemed in                            satisfactory condition to undergo the procedure.                           After obtaining informed consent, the endoscope was                            passed under direct vision. Throughout the                            procedure, the patient's blood pressure, pulse, and                            oxygen saturations were  monitored continuously. The                            [id number]) scope was introduced through the                            and advanced to the second part of duodenum. The                            upper GI endoscopy was accomplished without                            difficulty. The patient tolerated the procedure                            well. Scope In: 12:53:17 PM Scope Out: 12:55:57 PM Total Procedure Duration: 0 hours 2 minutes 40 seconds  Findings:      The examined esophagus was normal.      Mild portal hypertensive gastropathy was found in the entire examined       stomach.      The duodenal bulb and second portion of the duodenum were normal.       Estimated blood loss: none. Impression:               - Normal esophagus.                           - Portal hypertensive gastropathy.                           - Normal duodenal bulb and second portion of the                            duodenum.                           - No specimens collected. Moderate Sedation:      Moderate (conscious) sedation was personally administered by an       anesthesia professional. The following parameters were monitored: oxygen       saturation, heart rate, blood pressure, respiratory rate, EKG, adequacy  of pulmonary ventilation, and response to care. Total physician       intraservice time was 11 minutes. Recommendation:           - Discharge patient to home.                           - Resume previous diet.                           - Continue present medications.                           - Repeat upper endoscopy in 2 years for screening                            purposes.                           - Return to GI office in 6 months.                           - Patient has a contact number available for                            emergencies. The signs and symptoms of potential                            delayed complications were discussed with the                             patient. Return to normal activities tomorrow.                            Written discharge instructions were provided to the                            patient.                           - Resume previous diet. Procedure Code(s):        --- Professional ---                           [id number], Esophagogastroduodenoscopy, flexible,                            transoral; diagnostic, including collection of                            specimen(s) by brushing or washing, when performed                            (separate procedure) Diagnosis Code(s):        --- Professional ---                           K76.6, Portal hypertension  K31.89, Other diseases of stomach and duodenum                           Z13.810, Encounter for screening for upper                            gastrointestinal disorder CPT copyright 2016 [employer]. All rights reserved. The codes documented in this report are preliminary and upon coder review may  be revised to meet current compliance requirements. Cristopher Estimable. Leonard Feigel, MD Norvel Richards, MD 04/18/2017 1:01:17 PM This report has been signed electronically. Number of Addenda: 0

## 2017-04-18 NOTE — Anesthesia Preprocedure Evaluation (Signed)
Anesthesia Evaluation    Airway        Dental   Pulmonary Current Smoker,           Cardiovascular hypertension,      Neuro/Psych    GI/Hepatic   Endo/Other    Renal/GU      Musculoskeletal   Abdominal   Peds  Hematology   Anesthesia Other Findings   Reproductive/Obstetrics                             Anesthesia Physical Anesthesia Plan Anesthesia Quick Evaluation  

## 2017-04-18 NOTE — Interval H&P Note (Signed)
History and Physical Interval Note:  04/18/2017 12:40 PM  Sabrina Wyatt  has presented today for surgery, with the diagnosis of VARICEAL SCREENING  The various methods of treatment have been discussed with the patient and family. After consideration of risks, benefits and other options for treatment, the patient has consented to  Procedure(s) with comments: ESOPHAGOGASTRODUODENOSCOPY (EGD) WITH PROPOFOL (N/A) - 915  as a surgical intervention .  The patient's history has been reviewed, patient examined, no change in status, stable for surgery.  I have reviewed the patient's chart and labs.  Questions were answered to the patient's satisfaction.     Sabrina Wyatt   No change. Screening EGD per plan.    The risks, benefits, limitations, alternatives and imponderables have been reviewed with the patient. Potential for esophageal dilation, biopsy, etc. have also been reviewed.  Questions have been answered. All parties agreeable.

## 2017-04-18 NOTE — Discharge Instructions (Addendum)
EGD Discharge instructions Please read the instructions outlined below and refer to this sheet in the next few weeks. These discharge instructions provide you with general information on caring for yourself after you leave the hospital. Your doctor may also give you specific instructions. While your treatment has been planned according to the most current medical practices available, unavoidable complications occasionally occur. If you have any problems or questions after discharge, please call your doctor. ACTIVITY  You may resume your regular activity but move at a slower pace for the next 24 hours.   Take frequent rest periods for the next 24 hours.   Walking will help expel (get rid of) the air and reduce the bloated feeling in your abdomen.   No driving for 24 hours (because of the anesthesia (medicine) used during the test).   You may shower.   Do not sign any important legal documents or operate any machinery for 24 hours (because of the anesthesia used during the test).  NUTRITION  Drink plenty of fluids.   You may resume your normal diet.   Begin with a light meal and progress to your normal diet.   Avoid alcoholic beverages for 24 hours or as instructed by your caregiver.  MEDICATIONS  You may resume your normal medications unless your caregiver tells you otherwise.  WHAT YOU CAN EXPECT TODAY  You may experience abdominal discomfort such as a feeling of fullness or gas pains.  FOLLOW-UP  Your doctor will discuss the results of your test with you.  SEEK IMMEDIATE MEDICAL ATTENTION IF ANY OF THE FOLLOWING OCCUR:  Excessive nausea (feeling sick to your stomach) and/or vomiting.   Severe abdominal pain and distention (swelling).   Trouble swallowing.   Temperature over 101 F (37.8 C).   Rectal bleeding or vomiting of blood.    Repeat EGD in 2 years  Office visit with [place] in 6 months or as already scheduled

## 2017-04-18 NOTE — Anesthesia Procedure Notes (Signed)
Procedure Name: MAC Date/Time: 04/18/2017 12:46 PM Performed by: Andree Elk, AMY A Pre-anesthesia Checklist: Patient identified, Emergency Drugs available, Suction available, Patient being monitored and Timeout performed Oxygen Delivery Method: Simple face mask

## 2017-04-18 NOTE — Anesthesia Postprocedure Evaluation (Signed)
Anesthesia Post Note  Patient: Sabrina Wyatt  Procedure(s) Performed: Procedure(s) (LRB): ESOPHAGOGASTRODUODENOSCOPY (EGD) WITH PROPOFOL (N/A)  Patient location during evaluation: PACU Anesthesia Type: MAC Level of consciousness: awake, patient cooperative and oriented Pain management: pain level controlled Vital Signs Assessment: post-procedure vital signs reviewed and stable Respiratory status: spontaneous breathing, respiratory function stable and patient connected to nasal cannula oxygen Cardiovascular status: stable Postop Assessment: no signs of nausea or vomiting Anesthetic complications: no     Last Vitals:  Vitals:   04/18/17 1215 04/18/17 1230  BP: 125/77 131/81  Pulse:    Resp: 19 15  Temp:    SpO2: 97% 97%    Last Pain:  Vitals:   04/18/17 1308  TempSrc:   PainSc: 0-No pain                 ADAMS, AMY A

## 2017-04-18 NOTE — Transfer of Care (Signed)
Immediate Anesthesia Transfer of Care Note  Patient: Sabrina Wyatt  Procedure(s) Performed: Procedure(s) with comments: ESOPHAGOGASTRODUODENOSCOPY (EGD) WITH PROPOFOL (N/A) - 915   Patient Location: PACU  Anesthesia Type:MAC  Level of Consciousness: awake, oriented and patient cooperative  Airway & Oxygen Therapy: Patient Spontanous Breathing and Patient connected to nasal cannula oxygen  Post-op Assessment: Report given to RN and Post -op Vital signs reviewed and stable  Post vital signs: Reviewed and stable  Last Vitals:  Vitals:   04/18/17 1215 04/18/17 1230  BP: 125/77 131/81  Pulse:    Resp: 19 15  Temp:    SpO2: 97% 97%    Last Pain:  Vitals:   04/18/17 1308  TempSrc:   PainSc: (P) 0-No pain      Patients Stated Pain Goal: 6 (59/27/63 9432)  Complications: No apparent anesthesia complications

## 2017-04-20 ENCOUNTER — Ambulatory Visit (INDEPENDENT_AMBULATORY_CARE_PROVIDER_SITE_OTHER): Payer: Medicare Other | Admitting: Orthopedic Surgery

## 2017-04-20 ENCOUNTER — Other Ambulatory Visit: Payer: Medicare Other | Admitting: Obstetrics and Gynecology

## 2017-04-20 ENCOUNTER — Ambulatory Visit (INDEPENDENT_AMBULATORY_CARE_PROVIDER_SITE_OTHER): Payer: Medicare Other

## 2017-04-20 DIAGNOSIS — G8929 Other chronic pain: Secondary | ICD-10-CM | POA: Diagnosis not present

## 2017-04-20 DIAGNOSIS — M25571 Pain in right ankle and joints of right foot: Secondary | ICD-10-CM

## 2017-04-20 DIAGNOSIS — R600 Localized edema: Secondary | ICD-10-CM | POA: Diagnosis not present

## 2017-04-20 NOTE — Progress Notes (Signed)
66 years old comes in for reevaluation right ankle and foot status post right ankle fracture  Last seen August 10 x-ray at that time show fracture healing  Patient comes in with swelling of the foot and pain around the fracture site  Review of Systems  Constitutional: Negative for fever.  Skin: Negative for rash.  Neurological: Negative for tingling.   Right Ankle Exam  Swelling: Moderate  Tenderness  The patient is experiencing tenderness in the lateral malleolus.  Range of Motion  Normal right ankle ROM  Muscle Strength  Normal right ankle strength  Tests  Anterior drawer: Negative Varus tilt: NegativeComments NEURO VASCULAR EXAM INTACT       Radiology reading dictated by Dr. Aline Brochure  3 views of the right ankle  Slightly high fibular fracture ankle mortise is intact  Again abundant callus is noted around the fibular fracture  Encounter Diagnoses  Name Primary?  . Chronic pain of right ankle Yes  . Localized edema     Patient was prescribed compression hose.

## 2017-04-21 ENCOUNTER — Encounter (HOSPITAL_COMMUNITY): Payer: Self-pay | Admitting: Internal Medicine

## 2017-04-29 DIAGNOSIS — R1084 Generalized abdominal pain: Secondary | ICD-10-CM | POA: Diagnosis not present

## 2017-04-29 DIAGNOSIS — G894 Chronic pain syndrome: Secondary | ICD-10-CM | POA: Diagnosis not present

## 2017-04-29 DIAGNOSIS — M545 Low back pain: Secondary | ICD-10-CM | POA: Diagnosis not present

## 2017-04-29 DIAGNOSIS — M79661 Pain in right lower leg: Secondary | ICD-10-CM | POA: Diagnosis not present

## 2017-05-04 ENCOUNTER — Other Ambulatory Visit: Payer: Self-pay | Admitting: Obstetrics and Gynecology

## 2017-05-04 ENCOUNTER — Encounter: Payer: Self-pay | Admitting: Obstetrics and Gynecology

## 2017-05-04 ENCOUNTER — Ambulatory Visit (INDEPENDENT_AMBULATORY_CARE_PROVIDER_SITE_OTHER): Payer: Medicare Other | Admitting: Obstetrics and Gynecology

## 2017-05-04 VITALS — BP 110/60 | HR 76 | Ht 64.5 in | Wt 147.0 lb

## 2017-05-04 DIAGNOSIS — Z01419 Encounter for gynecological examination (general) (routine) without abnormal findings: Secondary | ICD-10-CM | POA: Diagnosis not present

## 2017-05-04 DIAGNOSIS — Z1212 Encounter for screening for malignant neoplasm of rectum: Secondary | ICD-10-CM | POA: Diagnosis not present

## 2017-05-04 DIAGNOSIS — Z1211 Encounter for screening for malignant neoplasm of colon: Secondary | ICD-10-CM | POA: Diagnosis not present

## 2017-05-04 DIAGNOSIS — Z1231 Encounter for screening mammogram for malignant neoplasm of breast: Secondary | ICD-10-CM

## 2017-05-04 LAB — HEMOCCULT GUIAC POC 1CARD (OFFICE): Fecal Occult Blood, POC: NEGATIVE

## 2017-05-04 NOTE — Progress Notes (Signed)
Patient ID: Sabrina Wyatt, female   DOB: 11-20-1950, 66 y.o.   MRN: [id number]   [facility] Visit  @DATE @            Patient name: Sabrina Wyatt MRN [id number]  Date of birth: Mar 02, 1951  CC & HPI:  Sabrina Wyatt is a 66 y.o. female presenting today for a routine visit. No complaints at this time. Pt reports having a mammogram last year. She takes ~ 2-3 oxycodone a day for tightness in her abdomen.  She is followed by gastroenterology, has a pain clinic for her chronic opiate use for chronic abdominal pain She's actually quite happy with how she is doing an general ROS:  ROS All systems are negative except as noted in the HPI and PMH.   Pertinent History Reviewed:   Reviewed: Significant for Chronic Abdominal Pain Medical         Past Medical History:  Diagnosis Date  . Abdominal wall pain    chronic  . Anemia   . Arthritis    rheumatoid  . Biliary stone   . Chronic abdominal pain   . Chronic flank pain   . Chronic low back pain   . Cirrhosis, hepatitis C    U/S on 03/09/16= no HCC, pt has been vaccinated for Hep A and Hep B. s/p treatment of HCV with eradication  . Depression   . GERD (gastroesophageal reflux disease)   . Gonorrhea   . Hypertension   . 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 Intussusception ([place]) 04/2012  . Polysubstance abuse    HX of  . PTSD (post-traumatic stress disorder)   . Pulmonary nodules   . S/P colonoscopy 2005   Dr Moss Mc polyp removed, otherwise normal  . S/P endoscopy 08/27/10   antral erosions, otherwise normal, due egd 08/2012 to screen for varices  . Seizures ([place]) 1998   with brain bleed x1. None since then and on no meds  . Shortness of breath   . Tobacco abuse                               Surgical Hx:    Past Surgical History:  Procedure Laterality Date  . biliary stone removal  2015  . BIOPSY N/A 03/06/2015   Procedure: BIOPSY;  Surgeon: Daneil Dolin, MD;  Location: [facility];  Service: Endoscopy;   Laterality: N/A;  . CARPAL TUNNEL RELEASE Bilateral 12/2010  . CATARACT EXTRACTION W/PHACO Right 01/15/2016   Procedure: CATARACT EXTRACTION PHACO AND INTRAOCULAR LENS PLACEMENT RIGHT EYE CDE=5.88;  Surgeon: Tonny Branch, MD;  Location: [facility];  Service: Ophthalmology;  Laterality: Right;  . CATARACT EXTRACTION W/PHACO Left 02/12/2016   Procedure: CATARACT EXTRACTION PHACO AND INTRAOCULAR LENS PLACEMENT LEFT EYE; CDE:  6.02;  Surgeon: Tonny Branch, MD;  Location: [facility];  Service: Ophthalmology;  Laterality: Left;  . CHOLECYSTECTOMY  2002  . COLONOSCOPY    09/10/2003   diminutive polyp in the rectum cold biopsied/removed/ Normal colon  . COLONOSCOPY WITH PROPOFOL N/A 08/02/2013   NWG:NFAOZHY diverticulosis. next TCS 10 years.  . ESOPHAGOGASTRODUODENOSCOPY    09/10/2003   Small hiatal hernia; otherwise normal stomach, normal D1 and D2  . ESOPHAGOGASTRODUODENOSCOPY  08/27/2010   QMV:HQIONG esophagus.  No varices.  Couple of tiny antral erosions of doubtful clinical significance, otherwise normal stomach, D1 and D2.  . ESOPHAGOGASTRODUODENOSCOPY (EGD) WITH PROPOFOL N/A 08/02/2013  RMR: Portal gastropathy. No explanation for abdominal pain which I believe is more abdominal wall in origin. She has been seen by Dr. Carlis Abbott  over at [facility]. She is up-to-date on cross sectional imaging. She has a pain management physician in [place]. Repeat EGD for varices in 3 years.  . ESOPHAGOGASTRODUODENOSCOPY (EGD) WITH PROPOFOL N/A 03/06/2015   [id number]:[id number] gastric mucosac s/p bx  . ESOPHAGOGASTRODUODENOSCOPY (EGD) WITH PROPOFOL N/A 04/18/2017   Procedure: ESOPHAGOGASTRODUODENOSCOPY (EGD) WITH PROPOFOL;  Surgeon: Daneil Dolin, MD;  [facility];  Service: Endoscopy;  Laterality: N/A;  915   . LUMBAR FUSION    . NOSE SURGERY  1970  . PERCUTANEOUS TRANSHEPATIC CHOLANGIOGRAHPY AND BILLIARY DRAINAGE  2002  . REPAIR VAGINAL CUFF N/A 10/25/2015   Procedure: REPAIR VAGINAL LACERATION ;  Surgeon: Jonnie Kind, MD;  Location: [facility];  Service: Gynecology;  Laterality: N/A;  . Roux-en-y-hepatojejunostomy  2003   revision in 2013 for intussusception [facility] Dr. Bailey Mech)  . SPINAL FUSION     C5-C7   Medications: Reviewed & Updated - see associated section                       Current Outpatient Prescriptions:  .  acyclovir (ZOVIRAX) 400 MG tablet, Take 1 tablet (400 mg total) by mouth 2 (two) times daily., Disp: 180 tablet, Rfl: 3 .  albuterol (PROVENTIL) (2.5 MG/3ML) 0.083% nebulizer solution, Take 2.5 mg by nebulization 2 (two) times daily as needed for wheezing or shortness of breath. Uses it average 3 times week, Disp: , Rfl:  .  ALPRAZolam (XANAX) 1 MG tablet, Take 1 mg by mouth at bedtime. , Disp: , Rfl:  .  atenolol (TENORMIN) 25 MG tablet, Take 25 mg by mouth every morning. , Disp: , Rfl:  .  Brexpiprazole (REXULTI) 1 MG TABS, Take 1 mg by mouth daily. , Disp: , Rfl:  .  Calcium-Magnesium-Vitamin D (CALCIUM 500 PO), Take 1 tablet by mouth daily., Disp: , Rfl:  .  Cholecalciferol (VITAMIN D-3) 1000 UNITS CAPS, Take 1,000 Units by mouth daily. , Disp: , Rfl:  .  Cyanocobalamin (VITAMIN B 12 PO), Take 1 tablet by mouth daily., Disp: , Rfl:  .  DULoxetine (CYMBALTA) 60 MG capsule, Take 60 mg by mouth 2 (two) times daily. , Disp: , Rfl:  .  EPIPEN 2-PAK 0.3 MG/0.3ML SOAJ injection, Inject 0.3 mg into the skin as needed (allergic reactions). , Disp: , Rfl:  .  etodolac (LODINE) 400 MG tablet, Take 400 mg by mouth 2 (two) times daily. , Disp: , Rfl:  .  ferrous sulfate 325 (65 FE) MG tablet, Take 325 mg by mouth daily with breakfast., Disp: , Rfl:  .  Flaxseed, Linseed, (FLAX SEEDS PO), Take 1 tablet by mouth daily., Disp: , Rfl:  .  fluticasone (FLONASE) 50 MCG/ACT nasal spray, Place 2 sprays into the nose daily.  , Disp: , Rfl:  .  gentamicin (GARAMYCIN) 0.3 % ophthalmic solution, Place 1 drop into both eyes as needed (dry eyes). , Disp: , Rfl:  .  hydrOXYzine (VISTARIL) 50 MG capsule,  Take 50 mg by mouth 2 (two) times daily. , Disp: , Rfl:  .  levocetirizine (XYZAL) 5 MG tablet, Take 5 mg by mouth daily as needed. , Disp: , Rfl:  .  loratadine (CLARITIN) 10 MG tablet, Take 10 mg by mouth daily as needed for allergies., Disp: , Rfl:  .  Omega-3 Fatty Acids (FISH OIL)  1000 MG CAPS, Take 1,000 mg by mouth 2 (two) times daily., Disp: , Rfl:  .  omeprazole (PRILOSEC) 20 MG capsule, TAKE ONE CAPSULE BY MOUTH EVERY DAY, Disp: 30 capsule, Rfl: 11 .  Oxycodone HCl 10 MG TABS, Take 10 mg by mouth 3 (three) times daily., Disp: , Rfl:  .  PROAIR HFA 108 (90 BASE) MCG/ACT inhaler, Inhale 2 puffs into the lungs every 6 (six) hours as needed for wheezing or shortness of breath. , Disp: , Rfl:  .  promethazine (PHENERGAN) 25 MG tablet, Take 25 mg by mouth every 6 (six) hours as needed for nausea or vomiting. , Disp: , Rfl:  .  pyridOXINE (VITAMIN B-6) 100 MG tablet, Take 100 mg by mouth daily., Disp: , Rfl:  .  tiZANidine (ZANAFLEX) 2 MG tablet, Take 4 mg by mouth 2 (two) times daily as needed., Disp: , Rfl:    Social History: Reviewed -  reports that she has been smoking Cigarettes.  She has a 8.25 pack-year smoking history. She has never used smokeless tobacco.  Objective Findings:  Vitals: Blood pressure 110/60, pulse 76, height 5' 4.5" (1.638 m), weight 147 lb (66.7 kg).  Physical Examination: General appearance - alert, well appearing, and in no distress and oriented to person, place, and time BREAST: Even breast tissue, good support, no masses ABDOMEN: multiple surgical scars s/p general surgery x 8 PELVIC: external genitalia is normal, vagina is normal, cervix has good support, uterus is tiny and mobile ADNEXA: negative RECTAL: good support and hemoccult negative  Assessment & Plan:   A:  1. Well woman exam with GYN 2. History of hepatitis C, with apparent care 3. 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  F/u in 1 year for Routine GYN exam with hemoccult and breast 2. Pap smear every 3 years 3.   By signing my name below, I, Margit Banda, attest that this documentation has been prepared under the direction and in the presence of Jonnie Kind, MD. Electronically Signed: Margit Banda, Medical Scribe. 05/04/17. 11:13 AM.  I personally performed the services described in this documentation, which was SCRIBED in my presence. The recorded information has been reviewed and considered accurate. It has been edited as necessary during review. Jonnie Kind, MD

## 2017-05-16 DIAGNOSIS — M545 Low back pain: Secondary | ICD-10-CM | POA: Diagnosis not present

## 2017-05-16 DIAGNOSIS — R5382 Chronic fatigue, unspecified: Secondary | ICD-10-CM | POA: Diagnosis not present

## 2017-05-16 DIAGNOSIS — M2550 Pain in unspecified joint: Secondary | ICD-10-CM | POA: Diagnosis not present

## 2017-05-19 DIAGNOSIS — E784 Other hyperlipidemia: Secondary | ICD-10-CM | POA: Diagnosis not present

## 2017-05-19 DIAGNOSIS — I10 Essential (primary) hypertension: Secondary | ICD-10-CM | POA: Diagnosis not present

## 2017-05-19 DIAGNOSIS — D500 Iron deficiency anemia secondary to blood loss (chronic): Secondary | ICD-10-CM | POA: Diagnosis not present

## 2017-05-19 DIAGNOSIS — D510 Vitamin B12 deficiency anemia due to intrinsic factor deficiency: Secondary | ICD-10-CM | POA: Diagnosis not present

## 2017-06-15 ENCOUNTER — Ambulatory Visit (HOSPITAL_COMMUNITY)
Admission: RE | Admit: 2017-06-15 | Discharge: 2017-06-15 | Disposition: A | Discharge status: Home / Self Care | Payer: Medicare Other | Source: Ambulatory Visit | Attending: Family Medicine | Admitting: Family Medicine

## 2017-06-15 ENCOUNTER — Other Ambulatory Visit (HOSPITAL_COMMUNITY): Payer: Self-pay | Admitting: Family Medicine

## 2017-06-15 DIAGNOSIS — M5136 Other intervertebral disc degeneration, lumbar region: Secondary | ICD-10-CM | POA: Insufficient documentation

## 2017-06-15 DIAGNOSIS — I6529 Occlusion and stenosis of unspecified carotid artery: Secondary | ICD-10-CM | POA: Insufficient documentation

## 2017-06-15 DIAGNOSIS — M40202 Unspecified kyphosis, cervical region: Secondary | ICD-10-CM | POA: Diagnosis not present

## 2017-06-15 DIAGNOSIS — M50323 Other cervical disc degeneration at C6-C7 level: Secondary | ICD-10-CM | POA: Insufficient documentation

## 2017-06-15 DIAGNOSIS — Z981 Arthrodesis status: Secondary | ICD-10-CM

## 2017-06-15 DIAGNOSIS — M4326 Fusion of spine, lumbar region: Secondary | ICD-10-CM

## 2017-07-07 ENCOUNTER — Telehealth: Payer: Self-pay | Admitting: Internal Medicine

## 2017-07-07 NOTE — Telephone Encounter (Signed)
Letter mailed

## 2017-07-07 NOTE — Telephone Encounter (Signed)
Recall for ultrasound 

## 2017-08-25 ENCOUNTER — Other Ambulatory Visit: Payer: Self-pay | Admitting: *Deleted

## 2017-08-25 DIAGNOSIS — B182 Chronic viral hepatitis C: Secondary | ICD-10-CM

## 2017-08-25 DIAGNOSIS — K7460 Unspecified cirrhosis of liver: Principal | ICD-10-CM

## 2017-08-25 NOTE — Telephone Encounter (Signed)
Pt called in to get u/s scheduled. This has been scheduled for 08/29/17 at 8:30am, arrival time 8:15am, NPO after midnight. Aware of appt details

## 2017-08-25 NOTE — Progress Notes (Signed)
ruq  

## 2017-08-29 ENCOUNTER — Ambulatory Visit (HOSPITAL_COMMUNITY)
Admission: RE | Admit: 2017-08-29 | Discharge: 2017-08-29 | Disposition: A | Discharge status: Home / Self Care | Payer: Medicare Other | Source: Ambulatory Visit | Attending: Gastroenterology | Admitting: Gastroenterology

## 2017-08-29 DIAGNOSIS — B182 Chronic viral hepatitis C: Secondary | ICD-10-CM

## 2017-08-29 DIAGNOSIS — Z9049 Acquired absence of other specified parts of digestive tract: Secondary | ICD-10-CM | POA: Insufficient documentation

## 2017-08-29 DIAGNOSIS — K7460 Unspecified cirrhosis of liver: Secondary | ICD-10-CM | POA: Insufficient documentation

## 2017-08-29 NOTE — Progress Notes (Signed)
Stable liver findings of cirrhosis without hepatoma.    NIC for ruq u/s in six months.  Keep appt with AB this month.

## 2017-09-21 ENCOUNTER — Encounter: Payer: Self-pay | Admitting: *Deleted

## 2017-09-21 ENCOUNTER — Telehealth: Payer: Self-pay | Admitting: *Deleted

## 2017-09-21 ENCOUNTER — Encounter: Payer: Self-pay | Admitting: Gastroenterology

## 2017-09-21 ENCOUNTER — Ambulatory Visit (INDEPENDENT_AMBULATORY_CARE_PROVIDER_SITE_OTHER): Payer: Medicare Other | Admitting: Gastroenterology

## 2017-09-21 VITALS — BP 129/85 | HR 92 | Temp 98.4°F | Ht 65.0 in | Wt 152.2 lb

## 2017-09-21 DIAGNOSIS — R1906 Epigastric swelling, mass or lump: Secondary | ICD-10-CM | POA: Diagnosis not present

## 2017-09-21 DIAGNOSIS — K7460 Unspecified cirrhosis of liver: Secondary | ICD-10-CM

## 2017-09-21 DIAGNOSIS — R1900 Intra-abdominal and pelvic swelling, mass and lump, unspecified site: Secondary | ICD-10-CM

## 2017-09-21 NOTE — Progress Notes (Signed)
Referring Provider: Rosita Fire, MD Primary Care Physician:  Lucia Gaskins, MD Primary GI: Dr. Gala Romney   Chief Complaint  Patient presents with  . Cirrhosis    HPI:   Sabrina Wyatt is a 67 y.o. female presenting today with a history of ETOH/HCV cirrhosis. Treated for Hep C in the past. Recently completed EGD with portal gastropathy and due again in 2 years for surveillance. US abdomen up-to-date. Due again in July 2019.   States a lot of her medications were adjusted per PCP. Off Xanax, Cymbalta decreased, off Xanaflex. She notes an area in her upper abdomen that feels "hard". No pain per se, just tingling. No N/V. Good appetite. No lower extremity edema. Denies confusion. Drinks wine once or twice a month "only with spaghetti".   Past Medical History:  Diagnosis Date  . Abdominal wall pain    chronic  . Anemia   . Arthritis    rheumatoid  . Biliary stone   . Chronic abdominal pain   . Chronic flank pain   . Chronic low back pain   . Cirrhosis, hepatitis C    U/S on 03/09/16= no HCC, pt has been vaccinated for Hep A and Hep B. s/p treatment of HCV with eradication  . Depression   . GERD (gastroesophageal reflux disease)   . Gonorrhea   . Hypertension   . 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 Intussusception ([place]) 04/2012  . Polysubstance abuse ([place])    HX of  . PTSD (post-traumatic stress disorder)   . Pulmonary nodules   . S/P colonoscopy 2005   Dr Moss Mc polyp removed, otherwise normal  . S/P endoscopy 08/27/10   antral erosions, otherwise normal, due egd 08/2012 to screen for varices  . Seizures ([place]) 1998   with brain bleed x1. None since then and on no meds  . Shortness of breath   . Tobacco abuse     Past Surgical History:  Procedure Laterality Date  . biliary stone removal  2015  . BIOPSY N/A 03/06/2015   Procedure: BIOPSY;  Surgeon: Daneil Dolin, MD;  Location: [facility];  Service: Endoscopy;  Laterality: N/A;  . CARPAL TUNNEL  RELEASE Bilateral 12/2010  . CATARACT EXTRACTION W/PHACO Right 01/15/2016   Procedure: CATARACT EXTRACTION PHACO AND INTRAOCULAR LENS PLACEMENT RIGHT EYE CDE=5.88;  Surgeon: Tonny Branch, MD;  Location: [facility];  Service: Ophthalmology;  Laterality: Right;  . CATARACT EXTRACTION W/PHACO Left 02/12/2016   Procedure: CATARACT EXTRACTION PHACO AND INTRAOCULAR LENS PLACEMENT LEFT EYE; CDE:  6.02;  Surgeon: Tonny Branch, MD;  Location: [facility];  Service: Ophthalmology;  Laterality: Left;  . CHOLECYSTECTOMY  2002  . COLONOSCOPY    09/10/2003   diminutive polyp in the rectum cold biopsied/removed/ Normal colon  . COLONOSCOPY WITH PROPOFOL N/A 08/02/2013   [facility]:NTIRWER diverticulosis. next TCS 10 years.  . ESOPHAGOGASTRODUODENOSCOPY    09/10/2003   Small hiatal hernia; otherwise normal stomach, normal D1 and D2  . ESOPHAGOGASTRODUODENOSCOPY  08/27/2010   XVQ:MGQQPY esophagus.  No varices.  Couple of tiny antral erosions of doubtful clinical significance, otherwise normal stomach, D1 and D2.  . ESOPHAGOGASTRODUODENOSCOPY (EGD) WITH PROPOFOL N/A 08/02/2013   RMR: Portal gastropathy. No explanation for abdominal pain which I believe is more abdominal wall in origin. She has been seen by Dr. Carlis Abbott  over at [facility]. She is up-to-date on cross sectional imaging. She has a pain management physician in [place]. Repeat EGD for varices in 3  years.  . ESOPHAGOGASTRODUODENOSCOPY (EGD) WITH PROPOFOL N/A 03/06/2015   WUJ:[id number] gastric mucosac s/p bx  . ESOPHAGOGASTRODUODENOSCOPY (EGD) WITH PROPOFOL N/A 04/18/2017   normal esophagus, portal hypertensive gastropathy, normal duodenum, no specimens collected. Surveillance in Aug 2020  . LUMBAR FUSION    . NOSE SURGERY  1970  . PERCUTANEOUS TRANSHEPATIC CHOLANGIOGRAHPY AND BILLIARY DRAINAGE  2002  . REPAIR VAGINAL CUFF N/A 10/25/2015   Procedure: REPAIR VAGINAL LACERATION ;  Surgeon: Jonnie Kind, MD;  Location: [facility];  Service: Gynecology;  Laterality: N/A;  .  Roux-en-y-hepatojejunostomy  2003   revision in 2013 for intussusception [facility] Dr. Bailey Mech)  . SPINAL FUSION     C5-C7    Current Outpatient Medications  Medication Sig Dispense Refill  . acyclovir (ZOVIRAX) 400 MG tablet Take 1 tablet (400 mg total) by mouth 2 (two) times daily. 180 tablet 3  . albuterol (PROVENTIL) (2.5 MG/3ML) 0.083% nebulizer solution Take 2.5 mg by nebulization 2 (two) times daily as needed for wheezing or shortness of breath. Uses it average 3 times week    . atenolol (TENORMIN) 25 MG tablet Take 25 mg by mouth every morning.     . Calcium-Magnesium-Vitamin D (CALCIUM 500 PO) Take 1 tablet by mouth daily.    . Cholecalciferol (VITAMIN D-3) 1000 UNITS CAPS Take 1,000 Units by mouth daily.     . Cyanocobalamin (VITAMIN B 12 PO) Take 1 tablet by mouth daily.    . DULoxetine (CYMBALTA) 60 MG capsule Take 60 mg by mouth daily.     Marland Kitchen EPIPEN 2-PAK 0.3 MG/0.3ML SOAJ injection Inject 0.3 mg into the skin as needed (allergic reactions).     Marland Kitchen etodolac (LODINE) 400 MG tablet Take 400 mg by mouth 2 (two) times daily.     . ferrous sulfate 325 (65 FE) MG tablet Take 325 mg by mouth daily with breakfast.    . Flaxseed, Linseed, (FLAX SEEDS PO) Take 1 tablet by mouth daily.    . fluticasone (FLONASE) 50 MCG/ACT nasal spray Place 2 sprays into the nose daily.      Marland Kitchen gentamicin (GARAMYCIN) 0.3 % ophthalmic solution Place 1 drop into both eyes as needed (dry eyes).     . hydrOXYzine (VISTARIL) 50 MG capsule Take 50 mg by mouth 2 (two) times daily.     Marland Kitchen levocetirizine (XYZAL) 5 MG tablet Take 5 mg by mouth daily as needed.     . loratadine (CLARITIN) 10 MG tablet Take 10 mg by mouth daily as needed for allergies.    . Omega-3 Fatty Acids (FISH OIL) 1000 MG CAPS Take 1,000 mg by mouth 2 (two) times daily.    Marland Kitchen omeprazole (PRILOSEC) 20 MG capsule TAKE ONE CAPSULE BY MOUTH EVERY DAY 30 capsule 11  . OxyCODONE HCl, Abuse Deter, (OXAYDO) 5 MG TABA Take 1 tablet by mouth 4 (four)  times daily.    Marland Kitchen PROAIR HFA 108 (90 BASE) MCG/ACT inhaler Inhale 2 puffs into the lungs every 6 (six) hours as needed for wheezing or shortness of breath.     . promethazine (PHENERGAN) 25 MG tablet Take 25 mg by mouth every 6 (six) hours as needed for nausea or vomiting.     . pyridOXINE (VITAMIN B-6) 100 MG tablet Take 100 mg by mouth daily.     No current facility-administered medications for this visit.     Allergies as of 09/21/2017 - Review Complete 09/21/2017  Allergen Reaction Noted  . Asa [aspirin] Nausea Only 04/13/2016  .  Bee venom Hives 07/10/2015  . Codeine Nausea Only 02/18/2017  . Tylenol [acetaminophen] Nausea And Vomiting and Swelling 07/10/2015    Family History  Problem Relation Age of Onset  . Coronary artery disease Brother   . Cancer Sister        lymphoma  . Cancer Father        brain    Social History   Socioeconomic History  . Marital status: Single    Spouse name: None  . Number of children: 1  . Years of education: None  . Highest education level: None  Social Needs  . Financial resource strain: None  . Food insecurity - worry: None  . Food insecurity - inability: None  . Transportation needs - medical: None  . Transportation needs - non-medical: None  Occupational History  . Occupation: disabled    Fish farm manager: UNEMPLOYED  Tobacco Use  . Smoking status: Current Every Day Smoker    Packs/day: 0.25    Years: 33.00    Pack years: 8.25    Types: Cigarettes  . Smokeless tobacco: Never Used  Substance and Sexual Activity  . Alcohol use: Yes    Alcohol/week: 0.0 oz    Comment: occasionally   . Drug use: No  . Sexual activity: Yes    Partners: Male    Birth control/protection: Post-menopausal, Condom  Other Topics Concern  . None  Social History Narrative  . None    Review of Systems: Gen: Denies fever, chills, anorexia. Denies fatigue, weakness, weight loss.  CV: Denies chest pain, palpitations, syncope, peripheral edema, and  claudication. Resp: Denies dyspnea at rest, cough, wheezing, coughing up blood, and pleurisy. GI: see HPI  Derm: Denies rash, itching, dry skin Psych: Denies depression, anxiety, memory loss, confusion. No homicidal or suicidal ideation.  Heme: Denies bruising, bleeding, and enlarged lymph nodes.  Physical Exam: BP 129/85   Pulse 92   Temp 98.4 F (36.9 C) (Oral)   Ht 5\' 5"  (1.651 m)   Wt 152 lb 3.2 oz (69 kg)   BMI 25.33 kg/m  General:   Alert and oriented. No distress noted. Pleasant and cooperative.  Head:  Normocephalic and atraumatic. Eyes:  Conjuctiva clear without scleral icterus. Mouth:  Oral mucosa pink and moist.  Abdomen:  +BS, soft, mild TTP epigastric region with firm mass-like prominence and non-distended. No rebound or guarding.  Msk:  Symmetrical without gross deformities. Normal posture. Extremities:  Without edema. Neurologic:  Alert and  oriented x4 Psych:  Alert and cooperative. Normal mood and affect.

## 2017-09-21 NOTE — Assessment & Plan Note (Signed)
History of ETOH/HCV previously treated for Hep C. Well-compensated. Next EGD in 2020. Recent labs on file. Return in 6 months and arrange [place] at that time.

## 2017-09-21 NOTE — Progress Notes (Signed)
cc'ed to pcp °

## 2017-09-21 NOTE — Assessment & Plan Note (Signed)
Firm prominence noted in upper abdomen. This is at site of prior surgeries. Query scar tissue; however, I have not appreciated this so prominently on prior exams. CT abdomen ordered.

## 2017-09-21 NOTE — Patient Instructions (Signed)
We have scheduled you for a CT scan.  We will see you in 6 months!

## 2017-09-21 NOTE — Telephone Encounter (Signed)
Checked pt [employer] and no PA is required

## 2017-10-03 ENCOUNTER — Ambulatory Visit (HOSPITAL_COMMUNITY): Payer: Medicare Other

## 2017-10-10 ENCOUNTER — Ambulatory Visit: Payer: Medicare Other | Admitting: Obstetrics and Gynecology

## 2017-10-12 ENCOUNTER — Emergency Department (HOSPITAL_COMMUNITY): Payer: Medicare Other

## 2017-10-12 ENCOUNTER — Other Ambulatory Visit: Payer: Self-pay

## 2017-10-12 ENCOUNTER — Emergency Department (HOSPITAL_COMMUNITY)
Admission: EM | Admit: 2017-10-12 | Discharge: 2017-10-12 | Disposition: A | Discharge status: Home / Self Care | Payer: Medicare Other | Attending: Emergency Medicine | Admitting: Emergency Medicine

## 2017-10-12 ENCOUNTER — Encounter (HOSPITAL_COMMUNITY): Payer: Self-pay | Admitting: Emergency Medicine

## 2017-10-12 DIAGNOSIS — M546 Pain in thoracic spine: Secondary | ICD-10-CM | POA: Insufficient documentation

## 2017-10-12 DIAGNOSIS — F17210 Nicotine dependence, cigarettes, uncomplicated: Secondary | ICD-10-CM | POA: Insufficient documentation

## 2017-10-12 DIAGNOSIS — I10 Essential (primary) hypertension: Secondary | ICD-10-CM | POA: Diagnosis not present

## 2017-10-12 DIAGNOSIS — M549 Dorsalgia, unspecified: Secondary | ICD-10-CM | POA: Diagnosis present

## 2017-10-12 DIAGNOSIS — R079 Chest pain, unspecified: Secondary | ICD-10-CM | POA: Diagnosis not present

## 2017-10-12 DIAGNOSIS — Z79899 Other long term (current) drug therapy: Secondary | ICD-10-CM | POA: Insufficient documentation

## 2017-10-12 LAB — COMPREHENSIVE METABOLIC PANEL
ALT: 19 U/L (ref 14–54)
AST: 29 U/L (ref 15–41)
Albumin: 4.1 g/dL (ref 3.5–5.0)
Alkaline Phosphatase: 97 U/L (ref 38–126)
Anion gap: 10 (ref 5–15)
BUN: 7 mg/dL (ref 6–20)
CO2: 27 mmol/L (ref 22–32)
Calcium: 9 mg/dL (ref 8.9–10.3)
Chloride: 100 mmol/L — ABNORMAL LOW (ref 101–111)
Creatinine, Ser: 0.73 mg/dL (ref 0.44–1.00)
GFR calc Af Amer: >60 mL/min (ref >60)
GFR calc non Af Amer: >60 mL/min (ref >60)
Glucose, Bld: 100 mg/dL — ABNORMAL HIGH (ref 65–99)
Potassium: 3.7 mmol/L (ref 3.5–5.1)
Sodium: 137 mmol/L (ref 135–145)
Total Bilirubin: 0.5 mg/dL (ref 0.3–1.2)
Total Protein: 7.7 g/dL (ref 6.5–8.1)

## 2017-10-12 LAB — CBC WITH DIFFERENTIAL/PLATELET
Basophils Absolute: 0 10*3/uL (ref 0.0–0.1)
Basophils Relative: 0 %
Eosinophils Absolute: 0.2 10*3/uL (ref 0.0–0.7)
Eosinophils Relative: 3 %
HCT: 48.6 % — ABNORMAL HIGH (ref 36.0–46.0)
Hemoglobin: 15.5 g/dL — ABNORMAL HIGH (ref 12.0–15.0)
Lymphocytes Relative: 38 %
Lymphs Abs: 2.6 10*3/uL (ref 0.7–4.0)
MCH: 31.4 pg (ref 26.0–34.0)
MCHC: 31.9 g/dL (ref 30.0–36.0)
MCV: 98.4 fL (ref 78.0–100.0)
Monocytes Absolute: 0.6 10*3/uL (ref 0.1–1.0)
Monocytes Relative: 9 %
Neutro Abs: 3.4 10*3/uL (ref 1.7–7.7)
Neutrophils Relative %: 50 %
Platelets: 163 10*3/uL (ref 150–400)
RBC: 4.94 MIL/uL (ref 3.87–5.11)
RDW: 13.1 % (ref 11.5–15.5)
WBC: 6.8 10*3/uL (ref 4.0–10.5)

## 2017-10-12 LAB — D-DIMER, QUANTITATIVE: D-Dimer, Quant: 1.28 ug/mL-FEU — ABNORMAL HIGH (ref 0.00–0.50)

## 2017-10-12 MED ORDER — TIZANIDINE HCL 2 MG PO CAPS: 2.0000 mg | ORAL_CAPSULE | Freq: Three times a day (TID) | ORAL | 0 refills | Status: DC | PRN

## 2017-10-12 MED ORDER — IOPAMIDOL (ISOVUE-370) INJECTION 76%
100.0000 mL | Freq: Once | INTRAVENOUS | Status: AC | PRN
  Administered 2017-10-12: 100 mL via INTRAVENOUS

## 2017-10-12 MED ORDER — ONDANSETRON HCL 4 MG/2ML IJ SOLN
4.0000 mg | Freq: Once | INTRAMUSCULAR | Status: AC
  Administered 2017-10-12: 4 mg via INTRAVENOUS
  Filled 2017-10-12: qty 2

## 2017-10-12 MED ORDER — HYDROMORPHONE HCL 1 MG/ML IJ SOLN
1.0000 mg | Freq: Once | INTRAMUSCULAR | Status: AC
  Administered 2017-10-12: 1 mg via INTRAVENOUS
  Filled 2017-10-12: qty 1

## 2017-10-12 NOTE — ED Notes (Signed)
Gave EKG to Dr. Zammit 

## 2017-10-12 NOTE — ED Provider Notes (Addendum)
[facility] EMERGENCY DEPARTMENT Provider Note   CSN: [id number] Arrival date & time: 10/12/17  1236     History   Chief Complaint Chief Complaint  Patient presents with  . Shortness of Breath  . Back Pain    HPI Sabrina Wyatt is a 67 y.o. female.  Patient complaining of upper back pain worse with inspiration.  Patient has a history of chronic back problems   The history is provided by the patient. No language interpreter was used.  Shortness of Breath  This is a recurrent problem. The problem occurs continuously.The current episode started more than 2 days ago. The problem has not changed since onset.Pertinent negatives include no fever, no headaches, no cough, no chest pain, no abdominal pain and no rash.  Back Pain   Pertinent negatives include no chest pain, no fever, no headaches and no abdominal pain.    Past Medical History:  Diagnosis Date  . Abdominal wall pain    chronic  . Anemia   . Arthritis    rheumatoid  . Biliary stone   . Chronic abdominal pain   . Chronic flank pain   . Chronic low back pain   . Cirrhosis, hepatitis C    U/S on 03/09/16= no HCC, pt has been vaccinated for Hep A and Hep B. s/p treatment of HCV with eradication  . Depression   . GERD (gastroesophageal reflux disease)   . Gonorrhea   . Hypertension   . 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 Intussusception ([place]) 04/2012  . Polysubstance abuse ([place])    HX of  . PTSD (post-traumatic stress disorder)   . Pulmonary nodules   . S/P colonoscopy 2005   Dr Moss Mc polyp removed, otherwise normal  . S/P endoscopy 08/27/10   antral erosions, otherwise normal, due egd 08/2012 to screen for varices  . Seizures ([place]) 1998   with brain bleed x1. None since then and on no meds  . Shortness of breath   . Tobacco abuse     Patient Active Problem List   Diagnosis Date Noted  . 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 Screening for STD (sexually transmitted disease) 12/19/2015  .  Vaginal laceration 10/25/2015  . Acute sinusitis with symptoms > 10 days 08/04/2015  . Epigastric mass 04/16/2015  . Abdominal swelling 04/16/2015  . Mucosal abnormality of stomach   . Nausea without vomiting 02/17/2015  . Loss of weight 02/17/2015  . Chronic folliculitis 31/49/7026  . 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 Insomnia 03/12/2014  . Folliculitis 37/85/8850  . Cirrhosis of liver ([place]) 11/15/2013  . Lower extremity edema 11/15/2013  . Abdominal pain, chronic, epigastric 07/10/2013  . Anorexia nervosa 07/10/2013  . 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 Muscle weakness (generalized) 12/28/2011  . CARPAL TUNNEL SYNDROME 10/16/2008  . ABDOMINAL BLOATING 10/16/2008  . Alcohol abuse 04/25/2008  . TOBACCO ABUSE 04/25/2008  . POST TRAUMATIC STRESS SYNDROME 04/22/2008  . DEPRESSION 04/22/2008  . INTRACRANIAL HEMORRHAGE 04/22/2008  . Esophageal reflux 04/22/2008  . INTUSSUSCEPTION 04/22/2008  . ABDOMINAL ADHESIONS 04/22/2008  . Hepatic cirrhosis due to chronic hepatitis C infection ([place]) 04/22/2008  . OBSTRUCTION OF BILE DUCT 04/22/2008  . LOW BACK PAIN, CHRONIC sees [place] [facility]. 04/22/2008  . Abdominal pain 04/22/2008  . ABDOMINAL PAIN OTHER SPECIFIED SITE 04/22/2008  . HEPATITIS C, HX OF 04/22/2008    Past Surgical History:  Procedure Laterality  Date  . biliary stone removal  2015  . BIOPSY N/A 03/06/2015   Procedure: BIOPSY;  Surgeon: Daneil Dolin, MD;  Location: [facility];  Service: Endoscopy;  Laterality: N/A;  . CARPAL TUNNEL RELEASE Bilateral 12/2010  . CATARACT EXTRACTION W/PHACO Right 01/15/2016   Procedure: CATARACT EXTRACTION PHACO AND INTRAOCULAR LENS PLACEMENT RIGHT EYE CDE=5.88;  Surgeon: Tonny Branch, MD;  Location: [facility];  Service: Ophthalmology;  Laterality: Right;  . CATARACT EXTRACTION W/PHACO Left 02/12/2016   Procedure: CATARACT EXTRACTION PHACO AND  INTRAOCULAR LENS PLACEMENT LEFT EYE; CDE:  6.02;  Surgeon: Tonny Branch, MD;  Location: [facility];  Service: Ophthalmology;  Laterality: Left;  . CHOLECYSTECTOMY  2002  . COLONOSCOPY    09/10/2003   diminutive polyp in the rectum cold biopsied/removed/ Normal colon  . COLONOSCOPY WITH PROPOFOL N/A 08/02/2013   NWG:NFAOZHY diverticulosis. next TCS 10 years.  . ESOPHAGOGASTRODUODENOSCOPY    09/10/2003   Small hiatal hernia; otherwise normal stomach, normal D1 and D2  . ESOPHAGOGASTRODUODENOSCOPY  08/27/2010   QMV:HQIONG esophagus.  No varices.  Couple of tiny antral erosions of doubtful clinical significance, otherwise normal stomach, D1 and D2.  . ESOPHAGOGASTRODUODENOSCOPY (EGD) WITH PROPOFOL N/A 08/02/2013   RMR: Portal gastropathy. No explanation for abdominal pain which I believe is more abdominal wall in origin. She has been seen by Dr. Carlis Abbott  over at [facility]. She is up-to-date on cross sectional imaging. She has a pain management physician in [place]. Repeat EGD for varices in 3 years.  . ESOPHAGOGASTRODUODENOSCOPY (EGD) WITH PROPOFOL N/A 03/06/2015   EXB:MWUXLKGM gastric mucosac s/p bx  . ESOPHAGOGASTRODUODENOSCOPY (EGD) WITH PROPOFOL N/A 04/18/2017   normal esophagus, portal hypertensive gastropathy, normal duodenum, no specimens collected. Surveillance in Aug 2020  . LUMBAR FUSION    . NOSE SURGERY  1970  . PERCUTANEOUS TRANSHEPATIC CHOLANGIOGRAHPY AND BILLIARY DRAINAGE  2002  . REPAIR VAGINAL CUFF N/A 10/25/2015   Procedure: REPAIR VAGINAL LACERATION ;  Surgeon: Jonnie Kind, MD;  Location: [facility];  Service: Gynecology;  Laterality: N/A;  . Roux-en-y-hepatojejunostomy  2003   revision in 2013 for intussusception [facility] Dr. Bailey Mech)  . SPINAL FUSION     C5-C7    OB History    Gravida Para Term Preterm AB Living   2 1 1   1 1    SAB TAB Ectopic Multiple Live Births       1           Home Medications    Prior to Admission medications   Medication Sig Start Date End  Date Taking? Authorizing Provider  acyclovir (ZOVIRAX) 400 MG tablet Take 1 tablet (400 mg total) by mouth 2 (two) times daily. 07/14/16  Yes Jonnie Kind, MD  albuterol (PROVENTIL) (2.5 MG/3ML) 0.083% nebulizer solution Take 2.5 mg by nebulization 2 (two) times daily as needed for wheezing or shortness of breath. Uses it average 3 times week   Yes [provider]  atenolol (TENORMIN) 25 MG tablet Take 25 mg by mouth every morning.    Yes [provider]  Calcium-Magnesium-Vitamin D (CALCIUM 500 PO) Take 1 tablet by mouth daily.   Yes [provider]  Cyanocobalamin (VITAMIN B 12 PO) Take 1 tablet by mouth daily.   Yes [provider]  DULoxetine (CYMBALTA) 60 MG capsule Take 60 mg by mouth daily.    Yes [provider]  etodolac (LODINE) 400 MG tablet Take 400 mg by mouth 2 (two) times daily.  04/13/12  Yes [provider]  ferrous sulfate 325 (65 FE) MG tablet Take 325 mg by mouth daily with breakfast.   Yes [provider]  Flaxseed, Linseed, (FLAX SEEDS PO) Take 1 tablet by mouth daily.   Yes [provider]  fluticasone (FLONASE) 50 MCG/ACT nasal spray Place 2 sprays into the nose daily.     Yes [provider]  gentamicin (GARAMYCIN) 0.3 % ophthalmic solution Place 1 drop into both eyes as needed (dry eyes).  02/09/17  Yes [provider]  hydrOXYzine (VISTARIL) 50 MG capsule Take 50 mg by mouth 2 (two) times daily.    Yes [provider]  levocetirizine (XYZAL) 5 MG tablet Take 5 mg by mouth daily as needed for allergies.  10/09/15  Yes [provider]  loratadine (CLARITIN) 10 MG tablet Take 10 mg by mouth daily as needed for allergies.   Yes [provider]  Omega-3 Fatty Acids (FISH OIL) 1000 MG CAPS Take 1,000 mg by mouth 2 (two) times daily.   Yes [provider]  omeprazole (PRILOSEC) 20 MG capsule TAKE ONE CAPSULE BY MOUTH EVERY DAY 09/10/12  Yes Vickey Huger  L, NP  OxyCODONE HCl, Abuse Deter, (OXAYDO) 5 MG TABA Take 1 tablet by mouth 4 (four) times daily.   Yes [provider]  PROAIR HFA 108 (90 BASE) MCG/ACT inhaler Inhale 2 puffs into the lungs every 6 (six) hours as needed for wheezing or shortness of breath.  02/21/12  Yes [provider]  pyridOXINE (VITAMIN B-6) 100 MG tablet Take 100 mg by mouth daily.   Yes [provider]  EPIPEN 2-PAK 0.3 MG/0.3ML SOAJ injection Inject 0.3 mg into the skin as needed (allergic reactions).  03/04/13   [provider]  tizanidine (ZANAFLEX) 2 MG capsule Take 1 capsule (2 mg total) by mouth 3 (three) times daily as needed for muscle spasms. 10/12/17   Milton Ferguson, MD    Family History Family History  Problem Relation Age of Onset  . Coronary artery disease Brother   . Cancer Sister        lymphoma  . Cancer Father        brain    Social History Social History   Tobacco Use  . Smoking status: Current Every Day Smoker    Packs/day: 0.25    Years: 33.00    Pack years: 8.25    Types: Cigarettes  . Smokeless tobacco: Never Used  Substance Use Topics  . Alcohol use: Yes    Alcohol/week: 0.0 oz    Comment: occasionally   . Drug use: No     Allergies   Asa [aspirin]; Bee venom; Codeine; and Tylenol [acetaminophen]   Review of Systems Review of Systems  Constitutional: Negative for appetite change, fatigue and fever.  HENT: Negative for congestion, ear discharge and sinus pressure.   Eyes: Negative for discharge.  Respiratory: Positive for shortness of breath. Negative for cough.   Cardiovascular: Negative for chest pain.  Gastrointestinal: Negative for abdominal pain and diarrhea.  Genitourinary: Negative for frequency and hematuria.  Musculoskeletal: Positive for back pain.  Skin: Negative for rash.  Neurological: Negative for seizures and headaches.  Psychiatric/Behavioral: Negative for hallucinations.     Physical Exam Updated Vital Signs BP  (!) 176/92 (BP Location: Left Arm)   Pulse 81   Temp 99.2 F (37.3 C) (Oral)   Resp 18   Ht 5\' 5"  (1.651 m)   Wt 68.9 kg (152 lb)   SpO2 97%  BMI 25.29 kg/m   Physical Exam  Constitutional: She is oriented to person, place, and time. She appears well-developed.  HENT:  Head: Normocephalic.  Eyes: Conjunctivae and EOM are normal. No scleral icterus.  Neck: Neck supple. No thyromegaly present.  Cardiovascular: Normal rate and regular rhythm. Exam reveals no gallop and no friction rub.  No murmur heard. Pulmonary/Chest: No stridor. She has no wheezes. She has no rales. She exhibits no tenderness.  Abdominal: She exhibits no distension. There is no tenderness. There is no rebound.  Musculoskeletal: Normal range of motion. She exhibits no edema.  Tenderness bilaterally mid back  Lymphadenopathy:    She has no cervical adenopathy.  Neurological: She is oriented to person, place, and time. She exhibits normal muscle tone. Coordination normal.  Skin: No rash noted. No erythema.  Psychiatric: She has a normal mood and affect. Her behavior is normal.     ED Treatments / Results  Labs (all labs ordered are listed, but only abnormal results are displayed) Labs Reviewed  CBC WITH DIFFERENTIAL/PLATELET - Abnormal; Notable for the following components:      Result Value   Hemoglobin 15.5 (*)    HCT 48.6 (*)    All other components within normal limits  COMPREHENSIVE METABOLIC PANEL - Abnormal; Notable for the following components:   Chloride 100 (*)    Glucose, Bld 100 (*)    All other components within normal limits  D-DIMER, QUANTITATIVE (NOT AT [facility]) - Abnormal; Notable for the following components:   D-Dimer, Quant 1.28 (*)    All other components within normal limits    EKG  EKG Interpretation None       Radiology Dg Chest 2 View  Result Date: 10/12/2017 CLINICAL DATA:  Chest tightness and shortness of breath.  Back pain. EXAM: CHEST  2 VIEW COMPARISON:  11/01/2013  radiography.  CT 01/04/2017. FINDINGS: Heart size is normal. Mediastinal shadows are normal. Mild aortic atherosclerosis. The lungs are clear. The vascularity is normal. No effusions. No acute bone finding. Partial compression fracture of a midthoracic vertebral body not visible in May of 2015. IMPRESSION: Active cardiopulmonary disease. Partial compression fracture of a midthoracic vertebral body, not visible on a chest CT of May 2018. Electronically Signed   By: Nelson Chimes M.D.   On: 10/12/2017 13:25   Ct Angio Chest Pe W And/or Wo Contrast  Result Date: 10/12/2017 CLINICAL DATA:  67 year old female with shortness of breath for the past 6 days. Back pain. Chest pressure. EXAM: CT ANGIOGRAPHY CHEST WITH CONTRAST TECHNIQUE: Multidetector CT imaging of the chest was performed using the standard protocol during bolus administration of intravenous contrast. Multiplanar CT image reconstructions and MIPs were obtained to evaluate the vascular anatomy. CONTRAST:  189mL ISOVUE-370 IOPAMIDOL (ISOVUE-370) INJECTION 76% COMPARISON:  Chest CT 01/04/2017. FINDINGS: Cardiovascular: There are no filling defects within the pulmonary arterial tree to suggest underlying pulmonary embolism. Heart size is normal. There is no significant pericardial fluid, thickening or pericardial calcification. There is aortic atherosclerosis, as well as atherosclerosis of the great vessels of the mediastinum and the coronary arteries, including calcified atherosclerotic plaque in the left anterior descending coronary artery. Mediastinum/Nodes: No pathologically enlarged mediastinal or hilar lymph nodes. Esophagus is unremarkable in appearance. No axillary lymphadenopathy. Lungs/Pleura: A few scattered tiny pulmonary nodules are again noted in the lungs, similar in size, number and distribution to the prior examination, with the largest of these nodules measuring up to 5 mm in the lateral segment of the right middle lobe (  axial image 93 of  series 6). No other larger more suspicious appearing pulmonary nodules or masses are noted. No acute consolidative airspace disease. No pleural effusions. Upper Abdomen: Small amount of pneumobilia again noted, presumably from prior sphincterotomy. Nodular contour of the liver, suggesting underlying cirrhosis. Musculoskeletal: There are no aggressive appearing lytic or blastic lesions noted in the visualized portions of the skeleton. Review of the MIP images confirms the above findings. IMPRESSION: 1. No evidence of pulmonary embolism. 2. No acute findings are noted in the thorax to account for the patient's symptoms. 3. Aortic atherosclerosis, in addition to left anterior descending coronary artery disease. Please note that although the presence of coronary artery calcium documents the presence of coronary artery disease, the severity of this disease and any potential stenosis cannot be assessed on this non-gated CT examination. Assessment for potential risk factor modification, dietary therapy or pharmacologic therapy may be warranted, if clinically indicated. 4. Multiple tiny pulmonary nodules stable compared to prior studies. Continued attention at time of routine low-dose lung cancer screening chest CT in May 2019 is recommended. 5. Morphologic changes in the liver suggestive of underlying cirrhosis. 6. Additional incidental findings, as above. Aortic Atherosclerosis (ICD10-I70.0). Electronically Signed   By: Vinnie Langton M.D.   On: 10/12/2017 18:05    Procedures Procedures (including critical care time)  Medications Ordered in ED Medications  HYDROmorphone (DILAUDID) injection 1 mg (1 mg Intravenous Given 10/12/17 1613)  ondansetron (ZOFRAN) injection 4 mg (4 mg Intravenous Given 10/12/17 1613)  iopamidol (ISOVUE-370) 76 % injection 100 mL (100 mLs Intravenous Contrast Given 10/12/17 1737)    EKG Interpretation  Date/Time:    Ventricular Rate:    PR Interval:    QRS Duration:   QT  Interval:    QTC Calculation:   R Axis:     Text Interpretation:       ekg pr interval 80,  qrs 144,  qtc 416,  Pt in normal sinus rate 73,  Normal ekg   Initial Impression / Assessment and Plan / ED Course  I have reviewed the triage vital signs and the nursing notes.  Pertinent labs & imaging results that were available during my care of the patient were reviewed by me and considered in my medical decision making (see chart for details).     Patient had a normal CT Angio of the chest.  Suspect this is exacerbation of her chronic back pain.  She was given some Zanaflex and will follow up with her PCP  Final Clinical Impressions(s) / ED Diagnoses   Final diagnoses:  Acute bilateral thoracic back pain    ED Discharge Orders        Ordered    tizanidine (ZANAFLEX) 2 MG capsule  3 times daily PRN     10/12/17 1830       Milton Ferguson, MD 10/12/17 Velta Addison    Milton Ferguson, MD 10/24/17 2203

## 2017-10-12 NOTE — ED Triage Notes (Signed)
Shortness of breath since [facility] with back pain  In bed all day in pain  Brought in by CNA who is home health aide  Back pain when she breathes

## 2017-10-12 NOTE — Discharge Instructions (Signed)
Follow-up with your doctor next week for recheck 

## 2017-10-13 ENCOUNTER — Ambulatory Visit (HOSPITAL_COMMUNITY): Admission: RE | Admit: 2017-10-13 | Payer: Medicare Other | Source: Ambulatory Visit

## 2017-10-28 ENCOUNTER — Ambulatory Visit (HOSPITAL_COMMUNITY)
Admission: RE | Admit: 2017-10-28 | Discharge: 2017-10-28 | Disposition: A | Discharge status: Home / Self Care | Payer: Medicare Other | Source: Ambulatory Visit | Attending: Gastroenterology | Admitting: Gastroenterology

## 2017-10-28 DIAGNOSIS — R1900 Intra-abdominal and pelvic swelling, mass and lump, unspecified site: Secondary | ICD-10-CM

## 2017-10-28 DIAGNOSIS — K7460 Unspecified cirrhosis of liver: Secondary | ICD-10-CM | POA: Insufficient documentation

## 2017-10-28 DIAGNOSIS — I700 Atherosclerosis of aorta: Secondary | ICD-10-CM | POA: Diagnosis not present

## 2017-10-29 MED ORDER — IOPAMIDOL (ISOVUE-300) INJECTION 61%
100.0000 mL | Freq: Once | INTRAVENOUS | Status: AC | PRN
  Administered 2017-10-29: 100 mL via INTRAVENOUS

## 2017-10-31 NOTE — Progress Notes (Signed)
CT was completed due to a firm prominence upper abdomen. This is likely scar tissue, as CT unrevealing. No acute findings on CT.

## 2017-11-07 ENCOUNTER — Telehealth: Payer: Self-pay | Admitting: *Deleted

## 2017-11-07 NOTE — Telephone Encounter (Signed)
Left message to try OTC Monistat first. JSY

## 2017-11-08 ENCOUNTER — Other Ambulatory Visit (HOSPITAL_COMMUNITY): Payer: Self-pay | Admitting: Family Medicine

## 2017-11-08 ENCOUNTER — Ambulatory Visit (HOSPITAL_COMMUNITY)
Admission: RE | Admit: 2017-11-08 | Discharge: 2017-11-08 | Disposition: A | Discharge status: Home / Self Care | Payer: Medicare Other | Source: Ambulatory Visit | Attending: Family Medicine | Admitting: Family Medicine

## 2017-11-08 DIAGNOSIS — M47892 Other spondylosis, cervical region: Secondary | ICD-10-CM | POA: Diagnosis not present

## 2017-11-08 DIAGNOSIS — M542 Cervicalgia: Secondary | ICD-10-CM | POA: Diagnosis present

## 2017-11-08 DIAGNOSIS — Z981 Arthrodesis status: Secondary | ICD-10-CM | POA: Diagnosis not present

## 2017-11-16 ENCOUNTER — Emergency Department (HOSPITAL_COMMUNITY)
Admission: EM | Admit: 2017-11-16 | Discharge: 2017-11-16 | Disposition: A | Discharge status: Home / Self Care | Payer: Medicare Other | Attending: Emergency Medicine | Admitting: Emergency Medicine

## 2017-11-16 ENCOUNTER — Encounter (HOSPITAL_COMMUNITY): Payer: Self-pay | Admitting: Emergency Medicine

## 2017-11-16 ENCOUNTER — Emergency Department (HOSPITAL_COMMUNITY): Payer: Medicare Other

## 2017-11-16 DIAGNOSIS — G8929 Other chronic pain: Secondary | ICD-10-CM | POA: Insufficient documentation

## 2017-11-16 DIAGNOSIS — M545 Low back pain: Secondary | ICD-10-CM | POA: Insufficient documentation

## 2017-11-16 DIAGNOSIS — Z981 Arthrodesis status: Secondary | ICD-10-CM | POA: Insufficient documentation

## 2017-11-16 DIAGNOSIS — F17210 Nicotine dependence, cigarettes, uncomplicated: Secondary | ICD-10-CM | POA: Diagnosis not present

## 2017-11-16 DIAGNOSIS — W010XXA Fall on same level from slipping, tripping and stumbling without subsequent striking against object, initial encounter: Secondary | ICD-10-CM | POA: Diagnosis not present

## 2017-11-16 DIAGNOSIS — Z79899 Other long term (current) drug therapy: Secondary | ICD-10-CM | POA: Insufficient documentation

## 2017-11-16 DIAGNOSIS — I10 Essential (primary) hypertension: Secondary | ICD-10-CM | POA: Diagnosis not present

## 2017-11-16 DIAGNOSIS — W19XXXA Unspecified fall, initial encounter: Secondary | ICD-10-CM

## 2017-11-16 NOTE — ED Notes (Signed)
Pt e signeed, but e signature pad not working. Pt verbalized understanding

## 2017-11-16 NOTE — ED Provider Notes (Signed)
[facility] EMERGENCY DEPARTMENT Provider Note   CSN: [id number] Arrival date & time: 11/16/17  1255     History   Chief Complaint Chief Complaint  Patient presents with  . Back Pain    HPI Sabrina Wyatt is a 67 y.o. female.  HPI Complains of low nonradiating back pain onset 2 months ago.  Pain is improved when she flexes at the waist.  She has not lost control of water or bowel.  She reports the pain became worse this morning when she tripped over the dog.  She is treated herself with oxycodone, without relief.  No fever no loss of bladder or bowel control.  No other associated symptoms she reports that she is not out of her oxycodone and she received a prescription for oxycodone 5 mg 120 tablets on 10/20/2017 [facility] Controlled Substance reporting System queried Past Medical History:  Diagnosis Date  . Abdominal wall pain    chronic  . Anemia   . Arthritis    rheumatoid  . Biliary stone   . Chronic abdominal pain   . Chronic flank pain   . Chronic low back pain   . Cirrhosis, hepatitis C    U/S on 03/09/16= no HCC, pt has been vaccinated for Hep A and Hep B. s/p treatment of HCV with eradication  . Depression   . GERD (gastroesophageal reflux disease)   . Gonorrhea   . Hypertension   . 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 Intussusception ([place]) 04/2012  . Polysubstance abuse ([place])    HX of  . PTSD (post-traumatic stress disorder)   . Pulmonary nodules   . S/P colonoscopy 2005   Dr Moss Mc polyp removed, otherwise normal  . S/P endoscopy 08/27/10   antral erosions, otherwise normal, due egd 08/2012 to screen for varices  . Seizures ([place]) 1998   with brain bleed x1. None since then and on no meds  . Shortness of breath   . Tobacco abuse     Patient Active Problem List   Diagnosis Date Noted  . 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 Screening for STD (sexually transmitted disease) 12/19/2015  . Vaginal laceration 10/25/2015  . Acute sinusitis  with symptoms > 10 days 08/04/2015  . Epigastric mass 04/16/2015  . Abdominal swelling 04/16/2015  . Mucosal abnormality of stomach   . Nausea without vomiting 02/17/2015  . Loss of weight 02/17/2015  . Chronic folliculitis 63/87/5643  . 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 Insomnia 03/12/2014  . Folliculitis 32/95/1884  . Cirrhosis of liver ([place]) 11/15/2013  . Lower extremity edema 11/15/2013  . Abdominal pain, chronic, epigastric 07/10/2013  . Anorexia nervosa 07/10/2013  . 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 Muscle weakness (generalized) 12/28/2011  . CARPAL TUNNEL SYNDROME 10/16/2008  . ABDOMINAL BLOATING 10/16/2008  . Alcohol abuse 04/25/2008  . TOBACCO ABUSE 04/25/2008  . POST TRAUMATIC STRESS SYNDROME 04/22/2008  . DEPRESSION 04/22/2008  . INTRACRANIAL HEMORRHAGE 04/22/2008  . Esophageal reflux 04/22/2008  . INTUSSUSCEPTION 04/22/2008  . ABDOMINAL ADHESIONS 04/22/2008  . Hepatic cirrhosis due to chronic hepatitis C infection ([place]) 04/22/2008  . OBSTRUCTION OF BILE DUCT 04/22/2008  . LOW BACK PAIN, CHRONIC sees [facility]. 04/22/2008  . Abdominal pain 04/22/2008  . ABDOMINAL PAIN OTHER SPECIFIED SITE 04/22/2008  . HEPATITIS C, HX OF 04/22/2008    Past Surgical History:  Procedure Laterality Date  . biliary stone  removal  2015  . BIOPSY N/A 03/06/2015   Procedure: BIOPSY;  Surgeon: Daneil Dolin, MD;  Location: [facility] ORS;  Service: Endoscopy;  Laterality: N/A;  . CARPAL TUNNEL RELEASE Bilateral 12/2010  . CATARACT EXTRACTION W/PHACO Right 01/15/2016   Procedure: CATARACT EXTRACTION PHACO AND INTRAOCULAR LENS PLACEMENT RIGHT EYE CDE=5.88;  Surgeon: Tonny Branch, MD;  Location: [facility] ORS;  Service: Ophthalmology;  Laterality: Right;  . CATARACT EXTRACTION W/PHACO Left 02/12/2016   Procedure: CATARACT EXTRACTION PHACO AND INTRAOCULAR LENS PLACEMENT LEFT EYE; CDE:  6.02;   Surgeon: Tonny Branch, MD;  Location: [facility] OR[facility];  Service: Ophthalmology;  Laterality: Left;  . CHOLECYSTECTOMY  2002  . COLONOSCOPY    09/10/2003   diminutive polyp in the rectum cold biopsied/removed/ Normal colon  . COLONOSCOPY WITH PROPOFOL N/A 08/02/2013   YIR:SWNIOEV diverticulosis. next TCS 10 years.  . ESOPHAGOGASTRODUODENOSCOPY    09/10/2003   Small hiatal hernia; otherwise normal stomach, normal D1 and D2  . ESOPHAGOGASTRODUODENOSCOPY  08/27/2010   OJJ:KKXFGH esophagus.  No varices.  Couple of tiny antral erosions of doubtful clinical significance, otherwise normal stomach, D1 and D2.  . ESOPHAGOGASTRODUODENOSCOPY (EGD) WITH PROPOFOL N/A 08/02/2013   RMR: Portal gastropathy. No explanation for abdominal pain which I believe is more abdominal wall in origin. She has been seen by Dr. Carlis Abbott  over at [facility]. She is up-to-date on cross sectional imaging. She has a pain management physician in [place]. Repeat EGD for varices in 3 years.  . ESOPHAGOGASTRODUODENOSCOPY (EGD) WITH PROPOFOL N/A 03/06/2015   WEX:HBZJIRCV gastric mucosac s/p bx  . ESOPHAGOGASTRODUODENOSCOPY (EGD) WITH PROPOFOL N/A 04/18/2017   normal esophagus, portal hypertensive gastropathy, normal duodenum, no specimens collected. Surveillance in Aug 2020  . LUMBAR FUSION    . NOSE SURGERY  1970  . PERCUTANEOUS TRANSHEPATIC CHOLANGIOGRAHPY AND BILLIARY DRAINAGE  2002  . REPAIR VAGINAL CUFF N/A 10/25/2015   Procedure: REPAIR VAGINAL LACERATION ;  Surgeon: Jonnie Kind, MD;  Location: [facility];  Service: Gynecology;  Laterality: N/A;  . Roux-en-y-hepatojejunostomy  2003   revision in 2013 for intussusception [facility] Dr. Bailey Mech)  . SPINAL FUSION     C5-C7     OB History    Gravida  2   Para  1   Term  1   Preterm      AB  1   Living  1     SAB      TAB      Ectopic  1   Multiple      Live Births               Home Medications    Prior to Admission medications   Medication Sig Start Date End  Date Taking? Authorizing Provider  acyclovir (ZOVIRAX) 400 MG tablet Take 1 tablet (400 mg total) by mouth 2 (two) times daily. 07/14/16   Jonnie Kind, MD  albuterol (PROVENTIL) (2.5 MG/3ML) 0.083% nebulizer solution Take 2.5 mg by nebulization 2 (two) times daily as needed for wheezing or shortness of breath. Uses it average 3 times week    [provider]  atenolol (TENORMIN) 25 MG tablet Take 25 mg by mouth every morning.     [provider]  Calcium-Magnesium-Vitamin D (CALCIUM 500 PO) Take 1 tablet by mouth daily.    [provider]  Cyanocobalamin (VITAMIN B 12 PO) Take 1 tablet by mouth daily.    [provider]  DULoxetine (CYMBALTA) 60 MG capsule Take 60 mg by mouth  daily.     [provider]  EPIPEN 2-PAK 0.3 MG/0.3ML SOAJ injection Inject 0.3 mg into the skin as needed (allergic reactions).  03/04/13   [provider]  etodolac (LODINE) 400 MG tablet Take 400 mg by mouth 2 (two) times daily.  04/13/12   [provider]  ferrous sulfate 325 (65 FE) MG tablet Take 325 mg by mouth daily with breakfast.    [provider]  Flaxseed, Linseed, (FLAX SEEDS PO) Take 1 tablet by mouth daily.    [provider]  fluticasone (FLONASE) 50 MCG/ACT nasal spray Place 2 sprays into the nose daily.      [provider]  gentamicin (GARAMYCIN) 0.3 % ophthalmic solution Place 1 drop into both eyes as needed (dry eyes).  02/09/17   [provider]  hydrOXYzine (VISTARIL) 50 MG capsule Take 50 mg by mouth 2 (two) times daily.     [provider]  levocetirizine (XYZAL) 5 MG tablet Take 5 mg by mouth daily as needed for allergies.  10/09/15   [provider]  loratadine (CLARITIN) 10 MG tablet Take 10 mg by mouth daily as needed for allergies.    [provider]  Omega-3 Fatty Acids (FISH OIL) 1000 MG CAPS Take 1,000 mg by mouth 2 (two) times daily.    [provider]    omeprazole (PRILOSEC) 20 MG capsule TAKE ONE CAPSULE BY MOUTH EVERY DAY 09/10/12   Andria Meuse, NP  OxyCODONE HCl, Abuse Deter, (OXAYDO) 5 MG TABA Take 1 tablet by mouth 4 (four) times daily.    [provider]  PROAIR HFA 108 (90 BASE) MCG/ACT inhaler Inhale 2 puffs into the lungs every 6 (six) hours as needed for wheezing or shortness of breath.  02/21/12   [provider]  pyridOXINE (VITAMIN B-6) 100 MG tablet Take 100 mg by mouth daily.    [provider]  tizanidine (ZANAFLEX) 2 MG capsule Take 1 capsule (2 mg total) by mouth 3 (three) times daily as needed for muscle spasms. 10/12/17   Milton Ferguson, MD    Family History Family History  Problem Relation Age of Onset  . Coronary artery disease Brother   . Cancer Sister        lymphoma  . Cancer Father        brain    Social History Social History   Tobacco Use  . Smoking status: Current Every Day Smoker    Packs/day: 0.25    Years: 33.00    Pack years: 8.25    Types: Cigarettes  . Smokeless tobacco: Never Used  Substance Use Topics  . Alcohol use: Yes    Alcohol/week: 0.0 oz    Comment: occasionally   . Drug use: No     Allergies   Asa [aspirin]; Bee venom; Codeine; and Tylenol [acetaminophen]   Review of Systems Review of Systems  Constitutional: Negative.   Gastrointestinal: Negative.   Musculoskeletal: Positive for back pain.  All other systems reviewed and are negative.    Physical Exam Updated Vital Signs BP 130/85 (BP Location: Left Arm)   Pulse (!) 111   Temp 99.3 F (37.4 C) (Oral)   Resp 16   Ht 5\' 5"  (1.651 m)   Wt 71.2 kg (157 lb)   SpO2 100%   BMI 26.13 kg/m   Physical Exam  Constitutional: She is oriented to person, place, and time. She appears well-developed and well-nourished.  HENT:  Head: Normocephalic and atraumatic.  Eyes: Pupils are equal, round, and reactive to light. Conjunctivae are normal.  Neck: Neck supple. No tracheal deviation present.  No thyromegaly present.  Cardiovascular: Regular rhythm.  No murmur heard. Mildly tachycardic  Pulmonary/Chest: Effort normal and breath sounds normal.  Abdominal: Soft. Bowel sounds are normal. She exhibits no distension. There is no tenderness.  Musculoskeletal: Normal range of motion. She exhibits no edema or tenderness.  Her entire spine is nontender.  She has pain at lumbar area when she stands up from a supine position.  4 extremities without contusion abrasion or tenderness neurovascular intact  Neurological: She is alert and oriented to person, place, and time. Coordination normal.  Gait normal motor strength 5/5 overall DTR symmetric bilaterally at knee jerk ankle jerk biceps toes downward going bilaterally  Skin: Skin is warm and dry. Capillary refill takes less than 2 seconds. No rash noted.  Psychiatric: She has a normal mood and affect.  Nursing note and vitals reviewed.    ED Treatments / Results  Labs (all labs ordered are listed, but only abnormal results are displayed) Labs Reviewed - No data to display  EKG None  Radiology No results found.  Procedures Procedures (including critical care time)  Medications Ordered in ED Medications - No data to display X-rays viewed by me Results for orders placed or performed during the hospital encounter of 10/12/17  CBC with Differential/Platelet  Result Value Ref Range   WBC 6.8 4.0 - 10.5 K/uL   RBC 4.94 3.87 - 5.11 MIL/uL   Hemoglobin 15.5 (H) 12.0 - 15.0 g/dL   HCT 48.6 (H) 36.0 - 46.0 %   MCV 98.4 78.0 - 100.0 fL   MCH 31.4 26.0 - 34.0 pg   MCHC 31.9 30.0 - 36.0 g/dL   RDW 13.1 11.5 - 15.5 %   Platelets 163 150 - 400 K/uL   Neutrophils Relative % 50 %   Neutro Abs 3.4 1.7 - 7.7 K/uL   Lymphocytes Relative 38 %   Lymphs Abs 2.6 0.7 - 4.0 K/uL   Monocytes Relative 9 %   Monocytes Absolute 0.6 0.1 - 1.0 K/uL   Eosinophils Relative 3 %   Eosinophils Absolute 0.2 0.0 - 0.7 K/uL   Basophils Relative 0 %    Basophils Absolute 0.0 0.0 - 0.1 K/uL  Comprehensive metabolic panel  Result Value Ref Range   Sodium 137 135 - 145 mmol/L   Potassium 3.7 3.5 - 5.1 mmol/L   Chloride 100 (L) 101 - 111 mmol/L   CO2 27 22 - 32 mmol/L   Glucose, Bld 100 (H) 65 - 99 mg/dL   BUN 7 6 - 20 mg/dL   Creatinine, Ser 0.73 0.44 - 1.00 mg/dL   Calcium 9.0 8.9 - 10.3 mg/dL   Total Protein 7.7 6.5 - 8.1 g/dL   Albumin 4.1 3.5 - 5.0 g/dL   AST 29 15 - 41 U/L   ALT 19 14 - 54 U/L   Alkaline Phosphatase 97 38 - 126 U/L   Total Bilirubin 0.5 0.3 - 1.2 mg/dL   GFR calc non Af Amer >60 >60 mL/min   GFR calc Af Amer >60 >60 mL/min   Anion gap 10 5 - 15  D-dimer, quantitative (not at [facility])  Result Value Ref Range   D-Dimer, Quant 1.28 (H) 0.00 - 0.50 ug/mL-FEU   Dg Cervical Spine Complete  Result Date: 11/08/2017 CLINICAL DATA:  Neck pain, history of previous surgery EXAM: CERVICAL SPINE - COMPLETE 4+ VIEW COMPARISON:  06/15/2017  FINDINGS: Prior fusion at C5-6 is again identified. Disc space narrowing at C3-4, C4-5 and C6-7 is noted with associated osteophytes. Facet hypertrophic changes are noted. Neural foraminal narrowing is noted on the right at C6-7. No soft tissue changes are seen. Carotid calcifications are noted. The odontoid is within normal limits. IMPRESSION: Changes of prior fusion at C5-6. Multilevel degenerative change overall stable from the prior exam. Electronically Signed   By: Inez Catalina M.D.   On: 11/08/2017 09:25   Dg Lumbar Spine Complete  Result Date: 11/16/2017 CLINICAL DATA:  Low back pain for several months with increased following trip and fall this morning, initial encounter EXAM: [place] 4+ VIEW COMPARISON:  06/15/2017 FINDINGS: Transitional anatomy is noted. Five lumbar type vertebral bodies are noted. The transitional segment represents S1 stable from the prior examination. Pedicle screws are noted at L5 and S1 with interbody fusion. The overall appearance is stable. No acute  hardware failure is seen. No pars defects are noted. Vertebral body height is well maintained. Mild osteophytic changes are noted. IMPRESSION: Mild degenerative and postoperative changes similar to that seen on the prior exam. Electronically Signed   By: Inez Catalina M.D.   On: 11/16/2017 13:51   Ct Abdomen W Contrast  Result Date: 10/28/2017 CLINICAL DATA:  Evaluate cirrhosis. EXAM: CT ABDOMEN WITH CONTRAST TECHNIQUE: Multidetector CT imaging of the abdomen was performed using the standard protocol following bolus administration of intravenous contrast. CONTRAST:  100 cc Isovue 300 COMPARISON:  06/02/2016 FINDINGS: Lower chest: No acute abnormality. Hepatobiliary: The liver has a diffusely lobular contour. There is marked hypertrophy of the lateral segment of left lobe. No focal liver abnormality. Previous cholecystectomy. Pneumobilia identified. No biliary dilatation. Pancreas: Unremarkable. No pancreatic ductal dilatation or surrounding inflammatory changes. Spleen: Normal in size without focal abnormality. Adrenals/Urinary Tract: The adrenal glands appear normal. Unremarkable appearance of the kidneys. Stomach/Bowel: Stomach is within normal limits. Appendix appears normal. No evidence of bowel wall thickening, distention, or inflammatory changes. Vascular/Lymphatic: Aortic atherosclerosis. No aneurysm. The portal vein appears patent. The IVC and hepatic veins are patent. Other: No free fluid or fluid collections. Small umbilical hernia contains fat only. Musculoskeletal: Previous posterior hardware fixation of the lower lumbar spine. IMPRESSION: 1. No acute findings identified within the abdomen or pelvis. 2. Hepatic cirrhosis. 3. Pneumobilia identified compatible with biliary patency. 4.  Aortic Atherosclerosis (ICD10-I70.0). Electronically Signed   By: Kerby Moors M.D.   On: 10/28/2017 12:38    Initial Impression / Assessment and Plan / ED Course  I have reviewed the triage vital signs and the  nursing notes.  Pertinent labs & imaging results that were available during my care of the patient were reviewed by me and considered in my medical decision making (see chart for details).    I Went over x-rays with patient 2:25 PM patient is alert ambulates without difficulty.  No obvious new injury as result of fall today. plan keep scheduled point with Dr. Arnoldo Morale November 29, 2017  Final Clinical Impressions(s) / ED Diagnoses  Dx#1 fall #2 chronic low back pain Final diagnoses:  None    ED Discharge Orders    None       Orlie Dakin, MD 11/16/17 1429

## 2017-11-16 NOTE — ED Triage Notes (Addendum)
Pt states hurting to lower back after tripping over dog. Pt states saw PCP a few weeks ago for cervical and lower back pain, was referred back to Dr. Arnoldo Morale who did her cervical surgery. Pt is out of her pain medication.

## 2017-11-16 NOTE — ED Notes (Signed)
Dr. Jacubowitz at the bedside 

## 2017-11-16 NOTE — Discharge Instructions (Signed)
Keep your scheduled appointment with Dr. Arnoldo Morale for November 29, 2017

## 2017-12-05 ENCOUNTER — Other Ambulatory Visit: Payer: Self-pay | Admitting: Obstetrics and Gynecology

## 2018-01-12 ENCOUNTER — Ambulatory Visit (HOSPITAL_COMMUNITY): Payer: Medicare Other

## 2018-01-25 ENCOUNTER — Telehealth: Payer: Self-pay | Admitting: Internal Medicine

## 2018-01-25 NOTE — Telephone Encounter (Signed)
RECALL FOR ULTRASOUND 

## 2018-01-25 NOTE — Telephone Encounter (Signed)
Letter mailed

## 2018-02-01 ENCOUNTER — Ambulatory Visit (HOSPITAL_COMMUNITY)
Admission: RE | Admit: 2018-02-01 | Discharge: 2018-02-01 | Disposition: A | Discharge status: Home / Self Care | Payer: Medicare Other | Source: Ambulatory Visit | Attending: Acute Care | Admitting: Acute Care

## 2018-02-01 DIAGNOSIS — I700 Atherosclerosis of aorta: Secondary | ICD-10-CM | POA: Diagnosis not present

## 2018-02-01 DIAGNOSIS — M4854XA Collapsed vertebra, not elsewhere classified, thoracic region, initial encounter for fracture: Secondary | ICD-10-CM | POA: Insufficient documentation

## 2018-02-01 DIAGNOSIS — F17210 Nicotine dependence, cigarettes, uncomplicated: Secondary | ICD-10-CM | POA: Diagnosis not present

## 2018-02-01 DIAGNOSIS — Z122 Encounter for screening for malignant neoplasm of respiratory organs: Secondary | ICD-10-CM | POA: Diagnosis not present

## 2018-02-01 DIAGNOSIS — J439 Emphysema, unspecified: Secondary | ICD-10-CM | POA: Diagnosis not present

## 2018-02-09 ENCOUNTER — Other Ambulatory Visit: Payer: Self-pay | Admitting: Acute Care

## 2018-02-09 DIAGNOSIS — Z122 Encounter for screening for malignant neoplasm of respiratory organs: Secondary | ICD-10-CM

## 2018-02-09 DIAGNOSIS — F17210 Nicotine dependence, cigarettes, uncomplicated: Secondary | ICD-10-CM

## 2018-02-13 ENCOUNTER — Telehealth: Payer: Self-pay | Admitting: Internal Medicine

## 2018-02-13 NOTE — Telephone Encounter (Signed)
Pt called to say that she went to the ER and has questions for the nurse and possibly getting a referral from [facility] to see a Dr Glenna Fellows. Please call her at [phone]

## 2018-02-13 NOTE — Telephone Encounter (Signed)
Lmom, waiting on a return call.  

## 2018-02-15 ENCOUNTER — Telehealth: Payer: Self-pay | Admitting: Internal Medicine

## 2018-02-15 NOTE — Telephone Encounter (Signed)
Pt was returning a call from AM. Please call her back at [phone]

## 2018-02-15 NOTE — Telephone Encounter (Signed)
Lmom, waiting on a return call.  

## 2018-02-15 NOTE — Telephone Encounter (Signed)
See other phone note

## 2018-02-15 NOTE — Telephone Encounter (Signed)
Pt was ok with asking her PCP or f/u with the ED.

## 2018-02-15 NOTE — Telephone Encounter (Signed)
PT returned call and said she needs a referral to see a lumbar specialist. Pt was seen 10/2017 at [facility]. Pt said she had fracture and compressed vertebrae. Pt said she was dismissed from Dr. Arnoldo Morale office and can't get a referral. Pt was asked to contact her PCP to discuss referral.

## 2018-02-25 ENCOUNTER — Emergency Department (HOSPITAL_COMMUNITY): Payer: Medicare Other

## 2018-02-25 ENCOUNTER — Emergency Department (HOSPITAL_COMMUNITY)
Admission: EM | Admit: 2018-02-25 | Discharge: 2018-02-25 | Disposition: A | Discharge status: Home / Self Care | Payer: Medicare Other | Attending: Emergency Medicine | Admitting: Emergency Medicine

## 2018-02-25 ENCOUNTER — Encounter (HOSPITAL_COMMUNITY): Payer: Self-pay | Admitting: *Deleted

## 2018-02-25 ENCOUNTER — Other Ambulatory Visit: Payer: Self-pay

## 2018-02-25 DIAGNOSIS — R0602 Shortness of breath: Secondary | ICD-10-CM | POA: Diagnosis present

## 2018-02-25 DIAGNOSIS — E860 Dehydration: Secondary | ICD-10-CM | POA: Diagnosis not present

## 2018-02-25 DIAGNOSIS — F10920 Alcohol use, unspecified with intoxication, uncomplicated: Secondary | ICD-10-CM

## 2018-02-25 DIAGNOSIS — Z9049 Acquired absence of other specified parts of digestive tract: Secondary | ICD-10-CM | POA: Diagnosis not present

## 2018-02-25 DIAGNOSIS — I10 Essential (primary) hypertension: Secondary | ICD-10-CM | POA: Diagnosis not present

## 2018-02-25 DIAGNOSIS — F17210 Nicotine dependence, cigarettes, uncomplicated: Secondary | ICD-10-CM | POA: Insufficient documentation

## 2018-02-25 DIAGNOSIS — F329 Major depressive disorder, single episode, unspecified: Secondary | ICD-10-CM | POA: Diagnosis not present

## 2018-02-25 DIAGNOSIS — Z79899 Other long term (current) drug therapy: Secondary | ICD-10-CM | POA: Diagnosis not present

## 2018-02-25 DIAGNOSIS — F10120 Alcohol abuse with intoxication, uncomplicated: Secondary | ICD-10-CM | POA: Insufficient documentation

## 2018-02-25 LAB — ETHANOL: Alcohol, Ethyl (B): 233 mg/dL — ABNORMAL HIGH (ref <10)

## 2018-02-25 LAB — CBC WITH DIFFERENTIAL/PLATELET
Basophils Absolute: 0 10*3/uL (ref 0.0–0.1)
Basophils Relative: 1 %
Eosinophils Absolute: 0.2 10*3/uL (ref 0.0–0.7)
Eosinophils Relative: 3 %
HCT: 44.1 % (ref 36.0–46.0)
Hemoglobin: 14.3 g/dL (ref 12.0–15.0)
Lymphocytes Relative: 54 %
Lymphs Abs: 3.8 10*3/uL (ref 0.7–4.0)
MCH: 32.2 pg (ref 26.0–34.0)
MCHC: 32.4 g/dL (ref 30.0–36.0)
MCV: 99.3 fL (ref 78.0–100.0)
Monocytes Absolute: 0.5 10*3/uL (ref 0.1–1.0)
Monocytes Relative: 6 %
Neutro Abs: 2.5 10*3/uL (ref 1.7–7.7)
Neutrophils Relative %: 36 %
Platelets: 158 10*3/uL (ref 150–400)
RBC: 4.44 MIL/uL (ref 3.87–5.11)
RDW: 13 % (ref 11.5–15.5)
WBC: 7 10*3/uL (ref 4.0–10.5)

## 2018-02-25 LAB — BASIC METABOLIC PANEL
Anion gap: 7 (ref 5–15)
BUN: 10 mg/dL (ref 8–23)
CO2: 25 mmol/L (ref 22–32)
Calcium: 8.3 mg/dL — ABNORMAL LOW (ref 8.9–10.3)
Chloride: 107 mmol/L (ref 98–111)
Creatinine, Ser: 0.72 mg/dL (ref 0.44–1.00)
GFR calc Af Amer: >60 mL/min (ref >60)
GFR calc non Af Amer: >60 mL/min (ref >60)
Glucose, Bld: 97 mg/dL (ref 70–99)
Potassium: 3.8 mmol/L (ref 3.5–5.1)
Sodium: 139 mmol/L (ref 135–145)

## 2018-02-25 LAB — TROPONIN I: Troponin I: <0.03 ng/mL (ref <0.03)

## 2018-02-25 MED ORDER — SODIUM CHLORIDE 0.9 % IV BOLUS (SEPSIS)
1000.0000 mL | Freq: Once | INTRAVENOUS | Status: AC
  Administered 2018-02-25: 1000 mL via INTRAVENOUS

## 2018-02-25 NOTE — ED Notes (Signed)
Patient transported to X-ray 

## 2018-02-25 NOTE — Discharge Instructions (Addendum)
Substance Abuse Treatment Programs ° °Intensive Outpatient Programs °[facility]     °[address]      °[place], [place]                   °[phone]      ° °[facility] °[address] °[place], [place] °[phone] ° °[facility]     °(Inpatient and outpatient)     °[address]           °[phone]   ° °[facility] °[phone] (Suboxone and Methadone) ° °[address]      °[place], [place] [address]      °[phone]      ° °[address] °[place], [place] °[phone] ° °[facility] (Outpatient/Inpatient, [facility])    °(insurance only) [phone]      °       °[facility] (Groups & Residential) °[place], [place] °[phone] ° °   °[facility]     °[address]     °[place], [place]      °[phone]      ° °[facility] (for caregivers and family) °[address] °[place], [place] °[phone] ° ° ° ° ° °Residential Treatment Programs °[facility]      °[address], [place], [place] [address]  °[phone]      ° °[facility] °[address] [place], [place] [address] °[phone] ° °[facility]        °[phone]      ° °[facility] °[phone] ° °[facility]             °[address]                                         °[place], [place]                                                °[phone] or [phone]                              ° °[facility] [place] °[address] °[place], [address] °[phone] ° °[facility]    °[address]      °[place], [place]     °[phone]      ° °[facility] °[address] °[place], [place] °[phone] ° °[facility]   °[address]     °[place], [place] [address]     °[phone]      °Admissions: 8am-3pm M-F ° °[facility] °[address] °[place],  [place] °[phone] ° °[facility]: Residential Program (90 Days)   °[place], [place]      °[phone] or [phone]    ° °[facility] °[place], [place] °(Walk in Hours over the weekend or by referral) ° °[facility] °[address], [place],  [address] °[phone] ° °Crisis Mobile: [facility]:  1-877-626-1772 (for crisis response 24 hours a day) °[facility] Hotline:      1-800-256-2452 °Outpatient Psychiatry and Counseling ° °[facility]: Mobile Crisis   Management 24 hours:  1-877-626-1772 ° °[facility] sliding scale fee and walk in schedule: M-F 8am-12pm/1pm-3pm °[address]  °[place], [place] [address] °[phone] ° °[facility] °[address] °[place], [place] [address] °[phone] ° °[facility] (Formerly known [facility])- new patient walk-in appointments available Monday - Friday 8am -3pm.          °[address] °[place], [place] [address] °[phone] or crisis line- [phone] ° °[facility]/ Intensive Outpatient Therapy Program °[address] °[place], [place] [address] °[phone] ° °[facility]                  °Crisis Services      °[phone]      °[address]     °[place], [place] [address]                ° °[facility]   °[facility] °[phone] °[address] °[place], [place] [address] ° ° °[facility]          °[address]  °[place], [place] [address]       °[phone] ° °[facility] °[address] °[place], [place] [address] °[phone] ° °[facility]    °[address]    °[place], [place] [address]     °[phone]      ° °Parish McKinney, MD     °[address]     °[place] [address]     °[phone]     °  °[facility] °[address] °[place] [address] ° °[facility]     °[address]     °[place], [place]      °[phone]      ° °[facility] °Shamsher Ahluwalia, MD °[address] °[place], [place] [address] °[phone] ° °[facility]     °[address]     °[place], [place] [address]     °[phone]      ° °[facility] °[address] °[place], [place] [address] °[phone] °Resources for Temporary Residential Assistance/Crisis Centers ° °DAY CENTERS °[facility] °M-F 8am-3pm   °[address] [place], [place] [address]   336-332-0824 °Services include: laundry, barbering, support groups, case management, phone  & computer access, showers, AA/NA mtgs, mental health/substance abuse nurse, job skills class, disability information, VA assistance, spiritual classes, etc.  ° °HOMELESS SHELTERS ° °[facility]     °[facility]   °[address], [place]     °[phone]       °       °[facility] (women and children)       °[address] °[place], [place] [address] °336-275-0820 °[email or web address] for application and process °Application Required ° °[facility]   °[address]    °[place] [address]     °[phone]       °             °[facility] °[address] °, [place] [address] °[phone] °[phone](schedule application appt.) °Application Required ° °[facility] (women only)    °[address]     °[place], [place] [address]     °[phone]      °  Intake starts 6pm daily °Need valid ID, SSC, & Police report °[facility] [place] °[address] °[place], [place] °[phone] °Application Required ° °[facility] (men only)     °[address]      °[place], [place]     °[phone]      ° °[facility] °(Pregnant women only) °[address] °[place], [place] °[phone] ° °[facility]      °[address]      °[place], [place] [address]     °[phone]      °       °[facility] °[address] °[place], [place] °[phone] °90 day commitment/SA/Application process ° °[facility](men only)     °[address]     °[place], [place]     °[phone]       °Check-in at 7pm     °       °[facility] °[address] °[place], [place] [address] °336-248-6684 °Men/Women/Women and Children must be there by 7 pm ° °[facility] °[place], [place] °[phone]                ° °

## 2018-02-25 NOTE — ED Triage Notes (Signed)
Pt brought in by [facility] for c/o sob that started tonight; pt has ETOH; pt was 100% O2 on room air; cbg 163
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
## 2018-03-07 ENCOUNTER — Telehealth: Payer: Self-pay | Admitting: Internal Medicine

## 2018-03-07 DIAGNOSIS — K7460 Unspecified cirrhosis of liver: Secondary | ICD-10-CM

## 2018-03-07 NOTE — Telephone Encounter (Signed)
[place] abd RUQ scheduled for 03/09/18 at 9:30am, arrive at 9:15am. NPO after midnight before test. Called and informed pt. She requested appt next week d/t transportation.  [place] rescheduled to 03/15/18 at 9:30am, arrive at 9:15am. NPO after midnight. Called pt back and informed her of appt. Letter mailed.

## 2018-03-07 NOTE — Telephone Encounter (Signed)
Pt received letter to call to schedule her U/S. [phone]

## 2018-03-08 ENCOUNTER — Telehealth: Payer: Self-pay | Admitting: Gastroenterology

## 2018-03-08 NOTE — Telephone Encounter (Signed)
Since patient had CT liver in 10/2017, she will be due for ruq u/s in 04/2018. She does not need one right now. Please NIC.

## 2018-03-09 ENCOUNTER — Ambulatory Visit (HOSPITAL_COMMUNITY): Payer: Self-pay

## 2018-03-09 NOTE — Telephone Encounter (Signed)
Reminder in epic °

## 2018-03-15 ENCOUNTER — Ambulatory Visit (HOSPITAL_COMMUNITY): Payer: Medicare Other

## 2018-03-21 ENCOUNTER — Ambulatory Visit (HOSPITAL_COMMUNITY): Payer: Medicare Other

## 2018-03-21 NOTE — Telephone Encounter (Signed)
Called pt, [place] cancelled for today d/t she had CT 10/2017. She is aware [place] will be due 04/2018. She is aware of appt with AB tomorrow.

## 2018-03-22 ENCOUNTER — Encounter: Payer: Self-pay | Admitting: Gastroenterology

## 2018-03-22 ENCOUNTER — Encounter: Payer: Self-pay | Admitting: Internal Medicine

## 2018-03-22 ENCOUNTER — Ambulatory Visit (INDEPENDENT_AMBULATORY_CARE_PROVIDER_SITE_OTHER): Payer: Medicare Other | Admitting: Gastroenterology

## 2018-03-22 VITALS — BP 114/77 | HR 72 | Temp 97.2°F | Ht 65.0 in | Wt 154.4 lb

## 2018-03-22 DIAGNOSIS — K7460 Unspecified cirrhosis of liver: Secondary | ICD-10-CM | POA: Diagnosis not present

## 2018-03-22 NOTE — Progress Notes (Signed)
Primary Care Physician:  Lucia Gaskins, MD  Primary GI: Dr. Gala Romney   Chief Complaint  Patient presents with  . Cirrhosis    HPI:   Sabrina Wyatt is a 67 y.o. female presenting today with a history of ETOH/HCV cirrhosis. Treated for Hep C in the past. Recently completed EGD with portal gastropathy and due again in 2020. US abdomen due in Sept 2019.   Will be fitted for abdominal/back brace soon through [employer]. Alcohol once in a blue moon. No confusion or mental status changes. No swelling. No rectal bleeding. She presented to ED July 6th with alcohol intoxication.    Past Medical History:  Diagnosis Date  . Abdominal wall pain    chronic  . Anemia   . Arthritis    rheumatoid  . Biliary stone   . Chronic abdominal pain   . Chronic flank pain   . Chronic low back pain   . Cirrhosis, hepatitis C    U/S on 03/09/16= no HCC, pt has been vaccinated for Hep A and Hep B. s/p treatment of HCV with eradication  . Depression   . GERD (gastroesophageal reflux disease)   . Gonorrhea   . Hypertension   . Intracranial hemorrhage ([place]) 1998   left thalmic  . Intussusception ([place]) 04/2012  . Polysubstance abuse ([place])    HX of  . PTSD (post-traumatic stress disorder)   . Pulmonary nodules   . S/P colonoscopy 2005   Dr Moss Mc polyp removed, otherwise normal  . S/P endoscopy 08/27/10   antral erosions, otherwise normal, due egd 08/2012 to screen for varices  . Seizures ([place]) 1998   with brain bleed x1. None since then and on no meds  . Shortness of breath   . Tobacco abuse     Past Surgical History:  Procedure Laterality Date  . biliary stone removal  2015  . BIOPSY N/A 03/06/2015   Procedure: BIOPSY;  Surgeon: Daneil Dolin, MD;  Location: [facility] ORS;  Service: Endoscopy;  Laterality: N/A;  . CARPAL TUNNEL RELEASE Bilateral 12/2010  . CATARACT EXTRACTION W/PHACO Right 01/15/2016   Procedure: CATARACT EXTRACTION PHACO AND INTRAOCULAR LENS PLACEMENT RIGHT EYE CDE=5.88;   Surgeon: Tonny Branch, MD;  Location: [facility] ORS;  Service: Ophthalmology;  Laterality: Right;  . CATARACT EXTRACTION W/PHACO Left 02/12/2016   Procedure: CATARACT EXTRACTION PHACO AND INTRAOCULAR LENS PLACEMENT LEFT EYE; CDE:  6.02;  Surgeon: Tonny Branch, MD;  Location: [facility] OR[facility];  Service: Ophthalmology;  Laterality: Left;  . CHOLECYSTECTOMY  2002  . COLONOSCOPY    09/10/2003   diminutive polyp in the rectum cold biopsied/removed/ Normal colon  . COLONOSCOPY WITH PROPOFOL N/A 08/02/2013   KGM:WNUUVOZ diverticulosis. next TCS 10 years.  . ESOPHAGOGASTRODUODENOSCOPY    09/10/2003   Small hiatal hernia; otherwise normal stomach, normal D1 and D2  . ESOPHAGOGASTRODUODENOSCOPY  08/27/2010   DGU:YQIHKV esophagus.  No varices.  Couple of tiny antral erosions of doubtful clinical significance, otherwise normal stomach, D1 and D2.  . ESOPHAGOGASTRODUODENOSCOPY (EGD) WITH PROPOFOL N/A 08/02/2013   RMR: Portal gastropathy. No explanation for abdominal pain which I believe is more abdominal wall in origin. She has been seen by Dr. Carlis Abbott  over at [facility]. She is up-to-date on cross sectional imaging. She has a pain management physician in [place]. Repeat EGD for varices in 3 years.  . ESOPHAGOGASTRODUODENOSCOPY (EGD) WITH PROPOFOL N/A 03/06/2015   QQV:ZDGLOVFI gastric mucosac s/p bx  . ESOPHAGOGASTRODUODENOSCOPY (EGD) WITH PROPOFOL N/A 04/18/2017   normal  esophagus, portal hypertensive gastropathy, normal duodenum, no specimens collected. Surveillance in Aug 2020  . LUMBAR FUSION    . NOSE SURGERY  1970  . PERCUTANEOUS TRANSHEPATIC CHOLANGIOGRAHPY AND BILLIARY DRAINAGE  2002  . REPAIR VAGINAL CUFF N/A 10/25/2015   Procedure: REPAIR VAGINAL LACERATION ;  Surgeon: Jonnie Kind, MD;  Location: [facility] ORS;  Service: Gynecology;  Laterality: N/A;  . Roux-en-y-hepatojejunostomy  2003   revision in 2013 for intussusception [facility] Dr. Bailey Mech)  . SPINAL FUSION     C5-C7    Current Outpatient Medications    Medication Sig Dispense Refill  . acyclovir (ZOVIRAX) 400 MG tablet TAKE 1 TABLET BY MOUTH TWICE DAILY. 180 tablet 0  . albuterol (PROVENTIL) (2.5 MG/3ML) 0.083% nebulizer solution Take 2.5 mg by nebulization 2 (two) times daily as needed for wheezing or shortness of breath. Uses it average 3 times week    . atenolol (TENORMIN) 25 MG tablet Take 25 mg by mouth every morning.     . Calcium-Magnesium-Vitamin D (CALCIUM 500 PO) Take 1 tablet by mouth daily.    . Cyanocobalamin (VITAMIN B 12 PO) Take 1 tablet by mouth daily.    . DULoxetine (CYMBALTA) 60 MG capsule Take 60 mg by mouth daily.     Marland Kitchen EPIPEN 2-PAK 0.3 MG/0.3ML SOAJ injection Inject 0.3 mg into the skin as needed (allergic reactions).     . ferrous sulfate 325 (65 FE) MG tablet Take 325 mg by mouth daily with breakfast.    . fluticasone (FLONASE) 50 MCG/ACT nasal spray Place 2 sprays into the nose daily.      . hydrOXYzine (VISTARIL) 50 MG capsule Take 50 mg by mouth 2 (two) times daily.     Marland Kitchen levocetirizine (XYZAL) 5 MG tablet Take 5 mg by mouth daily as needed for allergies.     Marland Kitchen loratadine (CLARITIN) 10 MG tablet Take 10 mg by mouth daily as needed for allergies.    . Omega-3 Fatty Acids (FISH OIL) 1000 MG CAPS Take 1,000 mg by mouth 2 (two) times daily.    Marland Kitchen omeprazole (PRILOSEC) 20 MG capsule TAKE ONE CAPSULE BY MOUTH EVERY DAY 30 capsule 11  . OxyCODONE HCl, Abuse Deter, (OXAYDO) 5 MG TABA Take 1 tablet by mouth 3 (three) times daily.     Marland Kitchen PROAIR HFA 108 (90 BASE) MCG/ACT inhaler Inhale 2 puffs into the lungs every 6 (six) hours as needed for wheezing or shortness of breath.     . pyridOXINE (VITAMIN B-6) 100 MG tablet Take 100 mg by mouth daily.     No current facility-administered medications for this visit.     Allergies as of 03/22/2018 - Review Complete 03/22/2018  Allergen Reaction Noted  . Asa [aspirin] Nausea Only 04/13/2016  . Bee venom Hives 07/10/2015  . Codeine Nausea Only 02/18/2017  . Tylenol [acetaminophen]  Nausea And Vomiting and Swelling 07/10/2015    Family History  Problem Relation Age of Onset  . Coronary artery disease Brother   . Cancer Sister        lymphoma  . Cancer Father        brain    Social History   Socioeconomic History  . Marital status: Single    Spouse name: Not on file  . Number of children: 1  . Years of education: Not on file  . Highest education level: Not on file  Occupational History  . Occupation: disabled    Fish farm manager: UNEMPLOYED  Social Needs  . Emergency planning/management officer  strain: Not on file  . Food insecurity:    Worry: Not on file    Inability: Not on file  . Transportation needs:    Medical: Not on file    Non-medical: Not on file  Tobacco Use  . Smoking status: Current Every Day Smoker    Packs/day: 0.25    Years: 33.00    Pack years: 8.25    Types: Cigarettes  . Smokeless tobacco: Never Used  Substance and Sexual Activity  . Alcohol use: Yes    Alcohol/week: 0.0 oz    Comment: occasionally   . Drug use: No  . Sexual activity: Yes    Partners: Male    Birth control/protection: Post-menopausal, Condom  Lifestyle  . Physical activity:    Days per week: Not on file    Minutes per session: Not on file  . Stress: Not on file  Relationships  . Social connections:    Talks on phone: Not on file    Gets together: Not on file    Attends religious service: Not on file    Active member of club or organization: Not on file    Attends meetings of clubs or organizations: Not on file    Relationship status: Not on file  Other Topics Concern  . Not on file  Social History Narrative  . Not on file    Review of Systems: Gen: Denies fever, chills, anorexia. Denies fatigue, weakness, weight loss.  CV: Denies chest pain, palpitations, syncope, peripheral edema, and claudication. Resp: Denies dyspnea at rest, cough, wheezing, coughing up blood, and pleurisy. GI: see HPI  Derm: Denies rash, itching, dry skin Psych: Denies depression, anxiety,  memory loss, confusion. No homicidal or suicidal ideation.  Heme: Denies bruising, bleeding, and enlarged lymph nodes.  Physical Exam: BP 114/77   Pulse 72   Temp (!) 97.2 F (36.2 C) (Oral)   Ht 5\' 5"  (1.651 m)   Wt 154 lb 6.4 oz (70 kg)   BMI 25.69 kg/m  General:   Alert and oriented. No distress noted. Pleasant and cooperative.  Head:  Normocephalic and atraumatic. Eyes:  Conjuctiva clear without scleral icterus. Mouth:  Oral mucosa pink and moist.  Abdomen:  +BS, soft, non-tender and non-distended. No rebound or guarding. No HSM or masses noted. Msk:  Symmetrical without gross deformities. Normal posture. Extremities:  Without edema. Neurologic:  Alert and  oriented x4 Psych:  Alert and cooperative. Normal mood and affect.  Lab Results  Component Value Date   ALT 19 10/12/2017   AST 29 10/12/2017   ALKPHOS 97 10/12/2017   BILITOT 0.5 10/12/2017   Lab Results  Component Value Date   WBC 7.0 02/25/2018   HGB 14.3 02/25/2018   HCT 44.1 02/25/2018   MCV 99.3 02/25/2018   PLT 158 02/25/2018   Lab Results  Component Value Date   CREATININE 0.72 02/25/2018   BUN 10 02/25/2018   NA 139 02/25/2018   K 3.8 02/25/2018   CL 107 02/25/2018   CO2 25 02/25/2018

## 2018-03-22 NOTE — Progress Notes (Signed)
cc'd to pcp 

## 2018-03-22 NOTE — Assessment & Plan Note (Signed)
History of ETOH/HCV previously treated for Hep C. Well-compensated. Continues to drink intermittently. Counseled on absolute avoidance. Next EGD due in 2020. Recent labs on file from ED visit. Return in 6 months. [place] in Sept 2019.

## 2018-03-22 NOTE — Patient Instructions (Signed)
I recommend complete avoidance of alcohol as we discussed.  I am glad you are doing well overall!  We will see you in 6 months, and we will arrange an ultrasound in September.  It was a pleasure to see you today. I strive to create trusting relationships with patients to provide genuine, compassionate, and quality care. I value your feedback. If you receive a survey regarding your visit,  I greatly appreciate you taking time to fill this out.   Annitta Needs, PhD, ANP-BC [facility]

## 2018-04-03 ENCOUNTER — Telehealth: Payer: Self-pay | Admitting: Internal Medicine

## 2018-04-03 NOTE — Telephone Encounter (Signed)
Recall sent 

## 2018-04-03 NOTE — Telephone Encounter (Signed)
RECALL FOR RUQ ULTRASOUND  °

## 2018-04-14 ENCOUNTER — Ambulatory Visit (HOSPITAL_COMMUNITY)
Admission: RE | Admit: 2018-04-14 | Discharge: 2018-04-14 | Disposition: A | Discharge status: Home / Self Care | Payer: Medicare Other | Source: Ambulatory Visit | Attending: Family Medicine | Admitting: Family Medicine

## 2018-04-14 ENCOUNTER — Other Ambulatory Visit (HOSPITAL_COMMUNITY): Payer: Self-pay | Admitting: Family Medicine

## 2018-04-14 DIAGNOSIS — M4316 Spondylolisthesis, lumbar region: Secondary | ICD-10-CM | POA: Diagnosis not present

## 2018-04-14 DIAGNOSIS — I700 Atherosclerosis of aorta: Secondary | ICD-10-CM | POA: Insufficient documentation

## 2018-04-14 DIAGNOSIS — Z981 Arthrodesis status: Secondary | ICD-10-CM | POA: Insufficient documentation

## 2018-04-14 DIAGNOSIS — M545 Low back pain: Secondary | ICD-10-CM | POA: Insufficient documentation

## 2018-04-14 DIAGNOSIS — I708 Atherosclerosis of other arteries: Secondary | ICD-10-CM | POA: Diagnosis not present

## 2018-04-14 DIAGNOSIS — R52 Pain, unspecified: Secondary | ICD-10-CM

## 2018-05-06 ENCOUNTER — Other Ambulatory Visit: Payer: Self-pay | Admitting: Obstetrics and Gynecology

## 2018-05-10 ENCOUNTER — Telehealth: Payer: Self-pay | Admitting: Internal Medicine

## 2018-05-10 DIAGNOSIS — K7460 Unspecified cirrhosis of liver: Secondary | ICD-10-CM

## 2018-05-10 NOTE — Telephone Encounter (Signed)
Pt received letter to call and schedule her RUQ U/S. Please call [phone]

## 2018-05-10 NOTE — Telephone Encounter (Signed)
U/S scheduled for 05/16/18 at 8:30am. Arrival time 8:15am, npo after midnight prior to test. Called made patient aware of appt details.

## 2018-05-15 ENCOUNTER — Other Ambulatory Visit: Payer: Medicare Other | Admitting: Obstetrics and Gynecology

## 2018-05-16 ENCOUNTER — Encounter (INDEPENDENT_AMBULATORY_CARE_PROVIDER_SITE_OTHER): Payer: Self-pay

## 2018-05-16 ENCOUNTER — Other Ambulatory Visit (HOSPITAL_COMMUNITY)
Admission: RE | Admit: 2018-05-16 | Discharge: 2018-05-16 | Disposition: A | Discharge status: Home / Self Care | Payer: Medicare Other | Source: Ambulatory Visit | Attending: Obstetrics and Gynecology | Admitting: Obstetrics and Gynecology

## 2018-05-16 ENCOUNTER — Ambulatory Visit (INDEPENDENT_AMBULATORY_CARE_PROVIDER_SITE_OTHER): Payer: Medicare Other | Admitting: Obstetrics and Gynecology

## 2018-05-16 ENCOUNTER — Encounter: Payer: Self-pay | Admitting: Obstetrics and Gynecology

## 2018-05-16 ENCOUNTER — Ambulatory Visit (HOSPITAL_COMMUNITY)
Admission: RE | Admit: 2018-05-16 | Discharge: 2018-05-16 | Disposition: A | Discharge status: Home / Self Care | Payer: Medicare Other | Source: Ambulatory Visit | Attending: Gastroenterology | Admitting: Gastroenterology

## 2018-05-16 VITALS — BP 115/69 | HR 90 | Ht 65.0 in | Wt 162.6 lb

## 2018-05-16 DIAGNOSIS — Z1211 Encounter for screening for malignant neoplasm of colon: Secondary | ICD-10-CM | POA: Diagnosis not present

## 2018-05-16 DIAGNOSIS — K7460 Unspecified cirrhosis of liver: Secondary | ICD-10-CM | POA: Insufficient documentation

## 2018-05-16 DIAGNOSIS — Z01419 Encounter for gynecological examination (general) (routine) without abnormal findings: Secondary | ICD-10-CM

## 2018-05-16 DIAGNOSIS — Z1212 Encounter for screening for malignant neoplasm of rectum: Secondary | ICD-10-CM | POA: Diagnosis not present

## 2018-05-16 DIAGNOSIS — Z124 Encounter for screening for malignant neoplasm of cervix: Secondary | ICD-10-CM | POA: Diagnosis present

## 2018-05-16 LAB — HEMOCCULT GUIAC POC 1CARD (OFFICE): Fecal Occult Blood, POC: NEGATIVE

## 2018-05-16 NOTE — Progress Notes (Signed)
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 Rosine Door) History of hepatitis C with apparent cure Plan:  1. pap smear done, next pap due 2 years 2. Hemoccult done negative 3    Annual mammogram advised after age 67 Subjective:  Sabrina Wyatt is a 67 y.o. female G2P1011 who presents for annual exam. No LMP recorded. Patient is postmenopausal. The patient has complaints today of chronic right upper quadrant pain, pain management through [facility] [place] run by Dr. Rosine Door No GYN complaints, postmenopausal status The following portions of the patient's history were reviewed and updated as appropriate: allergies, current medications, past family history, past medical history, past social history, past surgical history and problem list. Past Medical History:  Diagnosis Date  . Abdominal wall pain    chronic  . Anemia   . Arthritis    rheumatoid  . Biliary stone   . Chronic abdominal pain   . Chronic flank pain   . Chronic low back pain   . Cirrhosis, hepatitis C    U/S on 03/09/16= no HCC, pt has been vaccinated for Hep A and Hep B. s/p treatment of HCV with eradication  . Depression   . GERD (gastroesophageal reflux disease)   . Gonorrhea   . Hypertension   . 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 Intussusception ([place]) 04/2012  . Polysubstance abuse ([place])    HX of  . PTSD (post-traumatic stress disorder)   . Pulmonary nodules   . S/P colonoscopy 2005   Dr Moss Mc polyp removed, otherwise normal  . S/P endoscopy 08/27/10   antral erosions, otherwise normal, due egd 08/2012 to screen for varices  . Seizures ([place]) 1998   with brain bleed x1. None since then and on no meds  . Shortness of breath   . Tobacco abuse     Past Surgical History:   Procedure Laterality Date  . biliary stone removal  2015  . BIOPSY N/A 03/06/2015   Procedure: BIOPSY;  Surgeon: Daneil Dolin, MD;  Location: [facility];  Service: Endoscopy;  Laterality: N/A;  . CARPAL TUNNEL RELEASE Bilateral 12/2010  . CATARACT EXTRACTION W/PHACO Right 01/15/2016   Procedure: CATARACT EXTRACTION PHACO AND INTRAOCULAR LENS PLACEMENT RIGHT EYE CDE=5.88;  Surgeon: Tonny Branch, MD;  Location: [facility];  Service: Ophthalmology;  Laterality: Right;  . CATARACT EXTRACTION W/PHACO Left 02/12/2016   Procedure: CATARACT EXTRACTION PHACO AND INTRAOCULAR LENS PLACEMENT LEFT EYE; CDE:  6.02;  Surgeon: Tonny Branch, MD;  Location: [facility];  Service: Ophthalmology;  Laterality: Left;  . CHOLECYSTECTOMY  2002  . COLONOSCOPY    09/10/2003   diminutive polyp in the rectum cold biopsied/removed/ Normal colon  . COLONOSCOPY WITH PROPOFOL N/A 08/02/2013   HQI:ONGEXBM diverticulosis. next TCS 10 years.  . ESOPHAGOGASTRODUODENOSCOPY    09/10/2003   Small hiatal hernia; otherwise normal stomach, normal D1 and D2  . ESOPHAGOGASTRODUODENOSCOPY  08/27/2010   WUX:LKGMWN esophagus.  No varices.  Couple of tiny antral erosions of doubtful clinical significance, otherwise normal stomach, D1 and D2.  . ESOPHAGOGASTRODUODENOSCOPY (EGD) WITH PROPOFOL N/A 08/02/2013   RMR: Portal gastropathy. No explanation for abdominal pain which I believe is more abdominal wall in origin. She has been seen by Dr. Carlis Abbott  over at [facility]. She is up-to-date on cross sectional imaging. She has a pain  management physician in [place]. Repeat EGD for varices in 3 years.  . ESOPHAGOGASTRODUODENOSCOPY (EGD) WITH PROPOFOL N/A 03/06/2015   QVZ:DGLOVFIE gastric mucosac s/p bx  . ESOPHAGOGASTRODUODENOSCOPY (EGD) WITH PROPOFOL N/A 04/18/2017   normal esophagus, portal hypertensive gastropathy, normal duodenum, no specimens collected. Surveillance in Aug 2020  . LUMBAR FUSION    . NOSE SURGERY  1970  . PERCUTANEOUS TRANSHEPATIC  CHOLANGIOGRAHPY AND BILLIARY DRAINAGE  2002  . REPAIR VAGINAL CUFF N/A 10/25/2015   Procedure: REPAIR VAGINAL LACERATION ;  Surgeon: Jonnie Kind, MD;  Location: [facility];  Service: Gynecology;  Laterality: N/A;  . Roux-en-y-hepatojejunostomy  2003   revision in 2013 for intussusception [facility] Dr. Bailey Mech)  . SPINAL FUSION     C5-C7     Current Outpatient Medications:  .  acyclovir (ZOVIRAX) 400 MG tablet, TAKE 1 TABLET BY MOUTH TWICE DAILY., Disp: 180 tablet, Rfl: 1 .  albuterol (PROVENTIL) (2.5 MG/3ML) 0.083% nebulizer solution, Take 2.5 mg by nebulization 2 (two) times daily as needed for wheezing or shortness of breath. Uses it average 3 times week, Disp: , Rfl:  .  atenolol (TENORMIN) 25 MG tablet, Take 25 mg by mouth every morning. , Disp: , Rfl:  .  Calcium-Magnesium-Vitamin D (CALCIUM 500 PO), Take 1 tablet by mouth daily., Disp: , Rfl:  .  Cyanocobalamin (VITAMIN B 12 PO), Take 1 tablet by mouth daily., Disp: , Rfl:  .  DULoxetine (CYMBALTA) 60 MG capsule, Take 60 mg by mouth daily. , Disp: , Rfl:  .  EPIPEN 2-PAK 0.3 MG/0.3ML SOAJ injection, Inject 0.3 mg into the skin as needed (allergic reactions). , Disp: , Rfl:  .  ferrous sulfate 325 (65 FE) MG tablet, Take 325 mg by mouth daily with breakfast., Disp: , Rfl:  .  fluticasone (FLONASE) 50 MCG/ACT nasal spray, Place 2 sprays into the nose daily.  , Disp: , Rfl:  .  hydrOXYzine (VISTARIL) 50 MG capsule, Take 50 mg by mouth 2 (two) times daily. , Disp: , Rfl:  .  levocetirizine (XYZAL) 5 MG tablet, Take 5 mg by mouth daily as needed for allergies. , Disp: , Rfl:  .  loratadine (CLARITIN) 10 MG tablet, Take 10 mg by mouth daily as needed for allergies., Disp: , Rfl:  .  Omega-3 Fatty Acids (FISH OIL) 1000 MG CAPS, Take 1,000 mg by mouth 2 (two) times daily., Disp: , Rfl:  .  omeprazole (PRILOSEC) 20 MG capsule, TAKE ONE CAPSULE BY MOUTH EVERY DAY, Disp: 30 capsule, Rfl: 11 .  OxyCODONE HCl, Abuse Deter, (OXAYDO) 5 MG TABA,  Take 1 tablet by mouth 3 (three) times daily. , Disp: , Rfl:  .  PROAIR HFA 108 (90 BASE) MCG/ACT inhaler, Inhale 2 puffs into the lungs every 6 (six) hours as needed for wheezing or shortness of breath. , Disp: , Rfl:  .  pyridOXINE (VITAMIN B-6) 100 MG tablet, Take 100 mg by mouth daily., Disp: , Rfl:   Review of Systems Constitutional: negative, See HPI Gastrointestinal: positive for Being followed by Dr. Gala Romney every 6 months Genitourinary: No stress incontinence no postmenopausal bleeding  Objective:  BP 115/69 (BP Location: Right Arm, Patient Position: Sitting, Cuff Size: Normal)   Pulse 90   Ht 5\' 5"  (1.651 m)   Wt 162 lb 9.6 oz (73.8 kg)   BMI 27.06 kg/m    BMI: Body mass index is 27.06 kg/m.  General Appearance: Alert, appropriate appearance for age. No acute distress HEENT: Grossly normal Neck /  Thyroid:  Cardiovascular: RRR; normal S1, S2, no murmur Lungs: CTA bilaterally Back: No CVAT Breast Exam: No dimpling, nipple retraction or discharge. No masses or nodes. and No masses or nodes.No dimpling, nipple retraction or discharge. Gastrointestinal: Soft, non-tender, no masses or organomegaly Pelvic Exam: Vulva and vagina appear normal. Bimanual exam reveals normal uterus and adnexa. Cervix: Irregular surface, multiparous, no masses Rectovaginal: normal rectal, no masses Lymphatic Exam: Non-palpable nodes in neck, clavicular, axillary, or inguinal regions Skin: no rash or abnormalities Neurologic: Normal gait and speech, no tremor  Psychiatric: Alert and oriented, appropriate affect.  Urinalysis: Not done  Mallory Shirk. MD Pgr [phone] 4:27 PM

## 2018-05-16 NOTE — Addendum Note (Signed)
Addended by: Linton Rump on: 05/16/2018 04:43 PM   Modules accepted: Orders

## 2018-05-18 NOTE — Progress Notes (Signed)
Cirrhosis on ultrasound. Repeat in 6 months.

## 2018-05-19 LAB — CYTOLOGY - PAP
Chlamydia: NEGATIVE
Diagnosis: NEGATIVE
HPV: NOT DETECTED
Neisseria Gonorrhea: NEGATIVE

## 2018-05-23 NOTE — Progress Notes (Signed)
CC'D TO PCP °

## 2018-06-12 ENCOUNTER — Ambulatory Visit (HOSPITAL_COMMUNITY)
Admission: RE | Admit: 2018-06-12 | Discharge: 2018-06-12 | Disposition: A | Discharge status: Home / Self Care | Payer: Medicare Other | Source: Ambulatory Visit | Attending: Family Medicine | Admitting: Family Medicine

## 2018-06-12 ENCOUNTER — Other Ambulatory Visit (HOSPITAL_COMMUNITY): Payer: Self-pay | Admitting: Family Medicine

## 2018-06-12 DIAGNOSIS — M4854XA Collapsed vertebra, not elsewhere classified, thoracic region, initial encounter for fracture: Secondary | ICD-10-CM | POA: Diagnosis not present

## 2018-06-12 DIAGNOSIS — M5136 Other intervertebral disc degeneration, lumbar region: Secondary | ICD-10-CM | POA: Diagnosis not present

## 2018-06-12 DIAGNOSIS — T148XXA Other injury of unspecified body region, initial encounter: Secondary | ICD-10-CM

## 2018-07-15 ENCOUNTER — Emergency Department (HOSPITAL_COMMUNITY): Payer: Medicare Other

## 2018-07-15 ENCOUNTER — Encounter (HOSPITAL_COMMUNITY): Payer: Self-pay | Admitting: *Deleted

## 2018-07-15 ENCOUNTER — Other Ambulatory Visit: Payer: Self-pay

## 2018-07-15 ENCOUNTER — Emergency Department (HOSPITAL_COMMUNITY)
Admission: EM | Admit: 2018-07-15 | Discharge: 2018-07-15 | Disposition: A | Discharge status: Home / Self Care | Payer: Medicare Other | Attending: Emergency Medicine | Admitting: Emergency Medicine

## 2018-07-15 DIAGNOSIS — R1011 Right upper quadrant pain: Secondary | ICD-10-CM | POA: Diagnosis not present

## 2018-07-15 DIAGNOSIS — W06XXXA Fall from bed, initial encounter: Secondary | ICD-10-CM | POA: Diagnosis not present

## 2018-07-15 DIAGNOSIS — F10920 Alcohol use, unspecified with intoxication, uncomplicated: Secondary | ICD-10-CM

## 2018-07-15 DIAGNOSIS — W19XXXA Unspecified fall, initial encounter: Secondary | ICD-10-CM

## 2018-07-15 DIAGNOSIS — Y92003 Bedroom of unspecified non-institutional (private) residence as the place of occurrence of the external cause: Secondary | ICD-10-CM | POA: Diagnosis not present

## 2018-07-15 DIAGNOSIS — Z79899 Other long term (current) drug therapy: Secondary | ICD-10-CM | POA: Insufficient documentation

## 2018-07-15 DIAGNOSIS — S0990XA Unspecified injury of head, initial encounter: Secondary | ICD-10-CM | POA: Insufficient documentation

## 2018-07-15 DIAGNOSIS — Y998 Other external cause status: Secondary | ICD-10-CM | POA: Insufficient documentation

## 2018-07-15 DIAGNOSIS — F10220 Alcohol dependence with intoxication, uncomplicated: Secondary | ICD-10-CM | POA: Diagnosis not present

## 2018-07-15 DIAGNOSIS — F17210 Nicotine dependence, cigarettes, uncomplicated: Secondary | ICD-10-CM | POA: Insufficient documentation

## 2018-07-15 DIAGNOSIS — I10 Essential (primary) hypertension: Secondary | ICD-10-CM | POA: Diagnosis not present

## 2018-07-15 DIAGNOSIS — Y939 Activity, unspecified: Secondary | ICD-10-CM | POA: Insufficient documentation

## 2018-07-15 LAB — COMPREHENSIVE METABOLIC PANEL
ALT: 32 U/L (ref 0–44)
AST: 47 U/L — ABNORMAL HIGH (ref 15–41)
Albumin: 3.6 g/dL (ref 3.5–5.0)
Alkaline Phosphatase: 91 U/L (ref 38–126)
Anion gap: 6 (ref 5–15)
BUN: 9 mg/dL (ref 8–23)
CO2: 27 mmol/L (ref 22–32)
Calcium: 8.2 mg/dL — ABNORMAL LOW (ref 8.9–10.3)
Chloride: 112 mmol/L — ABNORMAL HIGH (ref 98–111)
Creatinine, Ser: 0.66 mg/dL (ref 0.44–1.00)
GFR calc Af Amer: >60 mL/min (ref >60)
GFR calc non Af Amer: >60 mL/min (ref >60)
Glucose, Bld: 97 mg/dL (ref 70–99)
Potassium: 4.1 mmol/L (ref 3.5–5.1)
Sodium: 145 mmol/L (ref 135–145)
Total Bilirubin: 0.5 mg/dL (ref 0.3–1.2)
Total Protein: 7.3 g/dL (ref 6.5–8.1)

## 2018-07-15 LAB — CBC WITH DIFFERENTIAL/PLATELET
Abs Immature Granulocytes: 0.03 10*3/uL (ref 0.00–0.07)
Basophils Absolute: 0.1 10*3/uL (ref 0.0–0.1)
Basophils Relative: 1 %
Eosinophils Absolute: 0.2 10*3/uL (ref 0.0–0.5)
Eosinophils Relative: 3 %
HCT: 42.9 % (ref 36.0–46.0)
Hemoglobin: 13.2 g/dL (ref 12.0–15.0)
Immature Granulocytes: 1 %
Lymphocytes Relative: 53 %
Lymphs Abs: 3.2 10*3/uL (ref 0.7–4.0)
MCH: 32 pg (ref 26.0–34.0)
MCHC: 30.8 g/dL (ref 30.0–36.0)
MCV: 103.9 fL — ABNORMAL HIGH (ref 80.0–100.0)
Monocytes Absolute: 0.5 10*3/uL (ref 0.1–1.0)
Monocytes Relative: 9 %
Neutro Abs: 2 10*3/uL (ref 1.7–7.7)
Neutrophils Relative %: 33 %
Platelets: 153 10*3/uL (ref 150–400)
RBC: 4.13 MIL/uL (ref 3.87–5.11)
RDW: 13 % (ref 11.5–15.5)
WBC: 6 10*3/uL (ref 4.0–10.5)
nRBC: 0 % (ref 0.0–0.2)

## 2018-07-15 LAB — URINALYSIS, ROUTINE W REFLEX MICROSCOPIC
Bilirubin Urine: NEGATIVE
Glucose, UA: NEGATIVE mg/dL
Hgb urine dipstick: NEGATIVE
Ketones, ur: NEGATIVE mg/dL
Leukocytes, UA: NEGATIVE
Nitrite: NEGATIVE
Protein, ur: NEGATIVE mg/dL
Specific Gravity, Urine: 1.008 (ref 1.005–1.030)
pH: 5 (ref 5.0–8.0)

## 2018-07-15 LAB — ETHANOL: Alcohol, Ethyl (B): 272 mg/dL — ABNORMAL HIGH (ref <10)

## 2018-07-15 LAB — AMMONIA: Ammonia: 44 umol/L — ABNORMAL HIGH (ref 9–35)

## 2018-07-15 LAB — TROPONIN I: Troponin I: <0.03 ng/mL (ref <0.03)

## 2018-07-15 LAB — CBG MONITORING, ED: Glucose-Capillary: 88 mg/dL (ref 70–99)

## 2018-07-15 MED ORDER — IOPAMIDOL (ISOVUE-300) INJECTION 61%
100.0000 mL | Freq: Once | INTRAVENOUS | Status: AC | PRN
  Administered 2018-07-15: 100 mL via INTRAVENOUS

## 2018-07-15 MED ORDER — SODIUM CHLORIDE 0.9 % IV BOLUS
1000.0000 mL | Freq: Once | INTRAVENOUS | Status: AC
  Administered 2018-07-15: 1000 mL via INTRAVENOUS

## 2018-07-15 NOTE — ED Triage Notes (Signed)
Pt brought in by [facility] with c/o vomiting, lower abdomen, generalized weakness x 2 days. Denies diarrhea. Pt's boyfriend reported to EMS that she fell out of the bed last night. Pt denies LOC or any injury from the fall.

## 2018-07-15 NOTE — Discharge Instructions (Addendum)
You were evaluated in the emergency department for a fall and abdominal pain.  You felt better while you were here and did not want to continue with any further testing.  Please contact your primary care doctor on Monday for close follow-up.  Return to the hospital for any worsening symptoms.

## 2018-07-15 NOTE — ED Notes (Signed)
Pt's friend now at bedside and encouraging pt to stay because CT is ready for pt to be imaged. Pt in agreement to stay at this time because CT is ready for her. Pt transported to CT at this time.

## 2018-07-15 NOTE — ED Provider Notes (Signed)
[facility] EMERGENCY DEPARTMENT Provider Note   CSN: [id number] Arrival date & time: 07/15/18  4403     History   Chief Complaint Chief Complaint  Patient presents with  . Abdominal Pain  . Fall    HPI Sabrina Wyatt is a 67 y.o. female.  She is very poor historian.  Presents by EMS after her boyfriend called them.  He said she has been having nausea and vomiting and generalized weakness for 2 days.  Last evening she fell out of bed.  The patient herself is complaining of some neck and back pain which are chronic issues for her and do not seem to be different since the fall.  She also has some right-sided abdominal pain.  There is been no chest pain no shortness of breath no diarrhea no dysuria.  The history is provided by the patient and a significant other.  Abdominal Pain   This is a new problem. The current episode started 2 days ago. The problem has not changed since onset.The pain is associated with an unknown factor. The pain is located in the RUQ. The quality of the pain is aching. The pain is mild. Associated symptoms include nausea and vomiting. Pertinent negatives include fever, diarrhea, constipation, dysuria, frequency and headaches. Nothing aggravates the symptoms. Nothing relieves the symptoms.  Fall  Associated symptoms include abdominal pain. Pertinent negatives include no chest pain, no headaches and no shortness of breath.    Past Medical History:  Diagnosis Date  . Abdominal wall pain    chronic  . Anemia   . Arthritis    rheumatoid  . Biliary stone   . Chronic abdominal pain   . Chronic flank pain   . Chronic low back pain   . Cirrhosis, hepatitis C    U/S on 03/09/16= no HCC, pt has been vaccinated for Hep A and Hep B. s/p treatment of HCV with eradication  . Depression   . GERD (gastroesophageal reflux disease)   . Gonorrhea   . Hypertension   . Intracranial hemorrhage ([place]) 1998   left thalmic  . Intussusception ([place]) 04/2012  . Polysubstance  abuse ([place])    HX of  . PTSD (post-traumatic stress disorder)   . Pulmonary nodules   . S/P colonoscopy 2005   Dr Moss Mc polyp removed, otherwise normal  . S/P endoscopy 08/27/10   antral erosions, otherwise normal, due egd 08/2012 to screen for varices  . Seizures ([place]) 1998   with brain bleed x1. None since then and on no meds  . Shortness of breath   . Tobacco abuse     Patient Active Problem List   Diagnosis Date Noted  . 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 Screening for STD (sexually transmitted disease) 12/19/2015  . Vaginal laceration 10/25/2015  . Acute sinusitis with symptoms > 10 days 08/04/2015  . Epigastric mass 04/16/2015  . Abdominal swelling 04/16/2015  . Mucosal abnormality of stomach   . Nausea without vomiting 02/17/2015  . Loss of weight 02/17/2015  . Chronic folliculitis 47/42/5956  . 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 Insomnia 03/12/2014  . Folliculitis 38/75/6433  . Cirrhosis of liver ([place]) 11/15/2013  . Lower extremity edema 11/15/2013  . Abdominal pain, chronic, epigastric 07/10/2013  . Anorexia nervosa 07/10/2013  . 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 Muscle weakness (generalized) 12/28/2011  . CARPAL TUNNEL SYNDROME 10/16/2008  . ABDOMINAL BLOATING 10/16/2008  .  Alcohol abuse 04/25/2008  . TOBACCO ABUSE 04/25/2008  . POST TRAUMATIC STRESS SYNDROME 04/22/2008  . DEPRESSION 04/22/2008  . INTRACRANIAL HEMORRHAGE 04/22/2008  . Esophageal reflux 04/22/2008  . INTUSSUSCEPTION 04/22/2008  . ABDOMINAL ADHESIONS 04/22/2008  . Hepatic cirrhosis due to chronic hepatitis C infection ([place]) 04/22/2008  . OBSTRUCTION OF BILE DUCT 04/22/2008  . LOW BACK PAIN, CHRONIC sees [facility]. 04/22/2008  . Abdominal pain 04/22/2008  . ABDOMINAL PAIN OTHER SPECIFIED SITE 04/22/2008  . HEPATITIS C, HX OF 04/22/2008    Past Surgical History:  Procedure  Laterality Date  . biliary stone removal  2015  . BIOPSY N/A 03/06/2015   Procedure: BIOPSY;  Surgeon: Daneil Dolin, MD;  Location: [facility];  Service: Endoscopy;  Laterality: N/A;  . CARPAL TUNNEL RELEASE Bilateral 12/2010  . CATARACT EXTRACTION W/PHACO Right 01/15/2016   Procedure: CATARACT EXTRACTION PHACO AND INTRAOCULAR LENS PLACEMENT RIGHT EYE CDE=5.88;  Surgeon: Tonny Branch, MD;  Location: [facility];  Service: Ophthalmology;  Laterality: Right;  . CATARACT EXTRACTION W/PHACO Left 02/12/2016   Procedure: CATARACT EXTRACTION PHACO AND INTRAOCULAR LENS PLACEMENT LEFT EYE; CDE:  6.02;  Surgeon: Tonny Branch, MD;  Location: [facility];  Service: Ophthalmology;  Laterality: Left;  . CHOLECYSTECTOMY  2002  . COLONOSCOPY    09/10/2003   diminutive polyp in the rectum cold biopsied/removed/ Normal colon  . COLONOSCOPY WITH PROPOFOL N/A 08/02/2013   PTW:SFKCLEX diverticulosis. next TCS 10 years.  . ESOPHAGOGASTRODUODENOSCOPY    09/10/2003   Small hiatal hernia; otherwise normal stomach, normal D1 and D2  . ESOPHAGOGASTRODUODENOSCOPY  08/27/2010   NTZ:GYFVCB esophagus.  No varices.  Couple of tiny antral erosions of doubtful clinical significance, otherwise normal stomach, D1 and D2.  . ESOPHAGOGASTRODUODENOSCOPY (EGD) WITH PROPOFOL N/A 08/02/2013   RMR: Portal gastropathy. No explanation for abdominal pain which I believe is more abdominal wall in origin. She has been seen by Dr. Carlis Abbott  over at [facility]. She is up-to-date on cross sectional imaging. She has a pain management physician in [place]. Repeat EGD for varices in 3 years.  . ESOPHAGOGASTRODUODENOSCOPY (EGD) WITH PROPOFOL N/A 03/06/2015   SWH:QPRFFMBW gastric mucosac s/p bx  . ESOPHAGOGASTRODUODENOSCOPY (EGD) WITH PROPOFOL N/A 04/18/2017   normal esophagus, portal hypertensive gastropathy, normal duodenum, no specimens collected. Surveillance in Aug 2020  . LUMBAR FUSION    . NOSE SURGERY  1970  . PERCUTANEOUS TRANSHEPATIC CHOLANGIOGRAHPY AND  BILLIARY DRAINAGE  2002  . REPAIR VAGINAL CUFF N/A 10/25/2015   Procedure: REPAIR VAGINAL LACERATION ;  Surgeon: Jonnie Kind, MD;  Location: [facility];  Service: Gynecology;  Laterality: N/A;  . Roux-en-y-hepatojejunostomy  2003   revision in 2013 for intussusception [facility] Dr. Bailey Mech)  . SPINAL FUSION     C5-C7     OB History    Gravida  2   Para  1   Term  1   Preterm      AB  1   Living  1     SAB      TAB      Ectopic  1   Multiple      Live Births               Home Medications    Prior to Admission medications   Medication Sig Start Date End Date Taking? Authorizing Provider  acyclovir (ZOVIRAX) 400 MG tablet TAKE 1 TABLET BY MOUTH TWICE DAILY. 05/06/18   Jonnie Kind, MD  albuterol (PROVENTIL) (2.5 MG/3ML) 0.083% nebulizer solution  Take 2.5 mg by nebulization 2 (two) times daily as needed for wheezing or shortness of breath. Uses it average 3 times week    [provider]  atenolol (TENORMIN) 25 MG tablet Take 25 mg by mouth every morning.     [provider]  Calcium-Magnesium-Vitamin D (CALCIUM 500 PO) Take 1 tablet by mouth daily.    [provider]  Cyanocobalamin (VITAMIN B 12 PO) Take 1 tablet by mouth daily.    [provider]  DULoxetine (CYMBALTA) 60 MG capsule Take 60 mg by mouth daily.     [provider]  EPIPEN 2-PAK 0.3 MG/0.3ML SOAJ injection Inject 0.3 mg into the skin as needed (allergic reactions).  03/04/13   [provider]  ferrous sulfate 325 (65 FE) MG tablet Take 325 mg by mouth daily with breakfast.    [provider]  fluticasone (FLONASE) 50 MCG/ACT nasal spray Place 2 sprays into the nose daily.      [provider]  hydrOXYzine (VISTARIL) 50 MG capsule Take 50 mg by mouth 2 (two) times daily.     [provider]  levocetirizine (XYZAL) 5 MG tablet Take 5 mg by mouth daily as needed for allergies.  10/09/15   [provider]    loratadine (CLARITIN) 10 MG tablet Take 10 mg by mouth daily as needed for allergies.    [provider]  Omega-3 Fatty Acids (FISH OIL) 1000 MG CAPS Take 1,000 mg by mouth 2 (two) times daily.    [provider]  omeprazole (PRILOSEC) 20 MG capsule TAKE ONE CAPSULE BY MOUTH EVERY DAY 09/10/12   Andria Meuse, NP  OxyCODONE HCl, Abuse Deter, (OXAYDO) 5 MG TABA Take 1 tablet by mouth 3 (three) times daily.     [provider]  PROAIR HFA 108 (90 BASE) MCG/ACT inhaler Inhale 2 puffs into the lungs every 6 (six) hours as needed for wheezing or shortness of breath.  02/21/12   [provider]  pyridOXINE (VITAMIN B-6) 100 MG tablet Take 100 mg by mouth daily.    [provider]    Family History Family History  Problem Relation Age of Onset  . Coronary artery disease Brother   . Cancer Sister        lymphoma  . Cancer Father        brain    Social History Social History   Tobacco Use  . Smoking status: Current Every Day Smoker    Packs/day: 0.25    Years: 33.00    Pack years: 8.25    Types: Cigarettes  . Smokeless tobacco: Never Used  Substance Use Topics  . Alcohol use: Not Currently    Alcohol/week: 0.0 standard drinks    Comment: occasionally   . Drug use: No     Allergies   Asa [aspirin]; Bee venom; Codeine; and Tylenol [acetaminophen]   Review of Systems Review of Systems  Constitutional: Negative for fever.  HENT: Negative for sore throat.   Eyes: Negative for visual disturbance.  Respiratory: Negative for shortness of breath.   Cardiovascular: Negative for chest pain.  Gastrointestinal: Positive for abdominal pain, nausea and vomiting. Negative for constipation and diarrhea.  Genitourinary: Negative for dysuria and frequency.  Musculoskeletal: Positive for back pain and neck pain.  Skin: Negative for rash.  Neurological: Negative for headaches.     Physical Exam Updated Vital Signs BP 132/80 (BP Location: Left  Arm)   Pulse 87   Temp 98.3 F (  36.8 C) (Oral)   Resp 18   Ht 5\' 5"  (1.651 m)   Wt 68 kg   SpO2 91%   BMI 24.96 kg/m   Physical Exam  Constitutional: She appears well-developed and well-nourished. No distress.  HENT:  Head: Normocephalic and atraumatic.  Eyes: Conjunctivae are normal.  Neck: Neck supple.  Cardiovascular: Normal rate, regular rhythm and normal heart sounds.  No murmur heard. Pulmonary/Chest: Effort normal and breath sounds normal. No respiratory distress.  Abdominal: Soft. She exhibits no mass. There is tenderness (mild, not reproducable). There is no guarding.  Musculoskeletal: Normal range of motion. She exhibits no edema, tenderness or deformity.  Neurological: She is alert.  Skin: Skin is warm and dry. Capillary refill takes less than 2 seconds.  Psychiatric: She has a normal mood and affect.  Nursing note and vitals reviewed.    ED Treatments / Results  Labs (all labs ordered are listed, but only abnormal results are displayed) Labs Reviewed  CBC WITH DIFFERENTIAL/PLATELET - Abnormal; Notable for the following components:      Result Value   MCV 103.9 (*)    All other components within normal limits  COMPREHENSIVE METABOLIC PANEL - Abnormal; Notable for the following components:   Chloride 112 (*)    Calcium 8.2 (*)    AST 47 (*)    All other components within normal limits  URINALYSIS, ROUTINE W REFLEX MICROSCOPIC - Abnormal; Notable for the following components:   APPearance HAZY (*)    All other components within normal limits  AMMONIA - Abnormal; Notable for the following components:   Ammonia 44 (*)    All other components within normal limits  ETHANOL - Abnormal; Notable for the following components:   Alcohol, Ethyl (B) 272 (*)    All other components within normal limits  TROPONIN I  CBG MONITORING, ED    EKG EKG Interpretation  Date/Time:  Saturday July 15 2018 09:51:07 EST Ventricular Rate:  85 PR Interval:    QRS  Duration: 89 QT Interval:  360 QTC Calculation: 428 R Axis:   62 Text Interpretation:  Sinus rhythm Low voltage, precordial leads Anteroseptal infarct, old T wave inversion 3 Confirmed by Aletta Edouard [phone]) on 07/15/2018 9:57:07 AM   Radiology Ct Head Wo Contrast  Result Date: 07/15/2018 CLINICAL DATA:  66 year old female with head injury from fall yesterday. Headache. EXAM: CT HEAD WITHOUT CONTRAST TECHNIQUE: Contiguous axial images were obtained from the base of the skull through the vertex without intravenous contrast. COMPARISON:  07/26/2015 head CT FINDINGS: Brain: No evidence of acute infarction, hemorrhage, hydrocephalus, extra-axial collection or mass lesion/mass effect. Mild atrophy, mild chronic small-vessel white matter ischemic changes and remote RIGHT thalamic infarct again noted. Vascular: Carotid atherosclerotic calcifications again note Skull: Normal. Negative for fracture or focal lesion. Sinuses/Orbits: No acute finding. Other: None. IMPRESSION: 1. No evidence of acute intracranial abnormality 2. Mild atrophy, chronic small-vessel white matter ischemic changes and remote RIGHT thalamic infarct. Electronically Signed   By: Margarette Canada M.D.   On: 07/15/2018 13:07   Ct Abdomen Pelvis W Contrast  Result Date: 07/15/2018 CLINICAL DATA:  67 year old female with acute abdominal pain and nausea/vomiting for 2 days. EXAM: CT ABDOMEN AND PELVIS WITH CONTRAST TECHNIQUE: Multidetector CT imaging of the abdomen and pelvis was performed using the standard protocol following bolus administration of intravenous contrast. CONTRAST:  171mL ISOVUE-300 IOPAMIDOL (ISOVUE-300) INJECTION 61% COMPARISON:  10/28/2017 and prior CTs FINDINGS: Lower chest: No acute abnormality. Hepatobiliary: Cirrhosis and hepatic steatosis again  noted. No focal hepatic lesions are identified. Small amount of pneumobilia is unchanged. Patient is status post cholecystectomy. No biliary dilatation. Pancreas: Unremarkable  Spleen: Unremarkable Adrenals/Urinary Tract: The kidneys, adrenal glands and bladder are unremarkable. Stomach/Bowel: Stomach is within normal limits. Appendix appears normal. No evidence of bowel wall thickening, distention, or inflammatory changes. Mild colonic diverticulosis noted without evidence of diverticulitis. Vascular/Lymphatic: Aortic atherosclerosis. No enlarged abdominal or pelvic lymph nodes. Reproductive: Uterus and bilateral adnexa are unremarkable. Other: No free fluid, abscess or pneumoperitoneum. Musculoskeletal: No acute or suspicious bony abnormalities. LOWER lumbar spine changes again noted. IMPRESSION: 1. No evidence of acute abnormality. 2. Cirrhosis and hepatic steatosis. 3.  Aortic Atherosclerosis (ICD10-I70.0). Electronically Signed   By: Margarette Canada M.D.   On: 07/15/2018 13:03    Procedures Procedures (including critical care time)  Medications Ordered in ED Medications  sodium chloride 0.9 % bolus 1,000 mL (has no administration in time range)     Initial Impression / Assessment and Plan / ED Course  I have reviewed the triage vital signs and the nursing notes.  Pertinent labs & imaging results that were available during my care of the patient were reviewed by me and considered in my medical decision making (see chart for details).  Clinical Course as of Jul 15 1500  Sat Jul 15, 2018  1217 She now tells me that she feels better and she would like to be discharged.  I reviewed her lab tests with her.  She is unwilling to stick around to get the CAT scan of her head or abdomen.  She said she does not drive.  I asked her to please follow-up with her primary care doctor for further evaluation and return if any worsening symptoms.   [MB]    Clinical Course User Index [MB] Hayden Rasmussen, MD     Final Clinical Impressions(s) / ED Diagnoses   Final diagnoses:  Fall, initial encounter  Alcoholic intoxication without complication [facility])    ED Discharge Orders      None       Hayden Rasmussen, MD 07/15/18 1501

## 2018-07-15 NOTE — ED Notes (Signed)
Pt wanting to leave AMA. Dr. Melina Copa notified.

## 2018-07-15 NOTE — ED Notes (Signed)
RN went to round on pt. Pt gone from the room. Monitors and pt gown laying on bed. Pt assumed to have left AMA.

## 2018-08-07 ENCOUNTER — Other Ambulatory Visit: Payer: Self-pay | Admitting: Obstetrics and Gynecology

## 2018-08-07 NOTE — Telephone Encounter (Signed)
refil x 1 yr 

## 2018-08-18 ENCOUNTER — Ambulatory Visit (HOSPITAL_COMMUNITY)
Admission: RE | Admit: 2018-08-18 | Discharge: 2018-08-18 | Disposition: A | Discharge status: Home / Self Care | Payer: Medicare Other | Source: Ambulatory Visit | Attending: Family Medicine | Admitting: Family Medicine

## 2018-08-18 ENCOUNTER — Other Ambulatory Visit (HOSPITAL_COMMUNITY): Payer: Self-pay | Admitting: Family Medicine

## 2018-08-18 DIAGNOSIS — M25562 Pain in left knee: Secondary | ICD-10-CM | POA: Insufficient documentation

## 2018-09-12 ENCOUNTER — Ambulatory Visit: Payer: Medicare Other | Admitting: Gastroenterology

## 2018-10-04 ENCOUNTER — Telehealth: Payer: Self-pay | Admitting: Internal Medicine

## 2018-10-04 NOTE — Telephone Encounter (Signed)
ON RECALL FOR ULTRASOUND  °

## 2018-10-04 NOTE — Telephone Encounter (Signed)
AB, patient had CT a/p 06/2018. Does she still need u/s in march? Thanks

## 2018-10-05 NOTE — Telephone Encounter (Signed)
May do in March 2020.

## 2018-10-05 NOTE — Telephone Encounter (Signed)
Recall sent 

## 2018-10-16 ENCOUNTER — Telehealth: Payer: Self-pay | Admitting: Internal Medicine

## 2018-10-16 DIAGNOSIS — K7460 Unspecified cirrhosis of liver: Secondary | ICD-10-CM

## 2018-10-16 NOTE — Telephone Encounter (Signed)
CHECKED EVICORE and was advise NOTE: This [facility] member does not require prior authorization for OUTPATIENT Radiology through [place] or [place] [facility] at this time.

## 2018-10-16 NOTE — Telephone Encounter (Signed)
U/s scheduled for 11/08/2018 at 9:30am, arrival time 9:15am, npo after midnight.  Called pt and she is aware.

## 2018-10-16 NOTE — Telephone Encounter (Signed)
Pt received letter to schedule RUQ U/S in March. Please call [phone]

## 2018-10-26 ENCOUNTER — Ambulatory Visit: Payer: Medicare Other | Admitting: Nurse Practitioner

## 2018-10-26 NOTE — Progress Notes (Deleted)
Referring Provider: Lucia Gaskins, MD Primary Care Physician:  Lucia Gaskins, MD Primary GI:  Dr. Gala Romney  No chief complaint on file.   HPI:   Sabrina Wyatt is a 68 y.o. female who presents for follow-up on cirrhosis.  The patient was last seen in our office 03/22/2018 for the same.  At that time noted history of EtOH/HCV cirrhosis treated for hep C in the past.  Recent EGD with portal gastropathy due again for surveillance in 2020.  Ultrasound of the abdomen due in September 2019.  Admitted to drinking alcohol "once in a blue moon".  Had recently been in the ER on July 6 with alcohol intoxication.  No other GI complaints.  Recommended complete cessation of alcohol, follow-up in 6 months, ultrasound in September.  Reviewed EGD completed 04/18/2017 which found normal esophagus, portal hypertensive gastropathy, normal duodenum.  Recommended repeat exam in 2 years.  It appears ultrasound was ordered but not completed.  Today she states   Past Medical History:  Diagnosis Date  . Abdominal wall pain    chronic  . Anemia   . Arthritis    rheumatoid  . Biliary stone   . Chronic abdominal pain   . Chronic flank pain   . Chronic low back pain   . Cirrhosis, hepatitis C    U/S on 03/09/16= no HCC, pt has been vaccinated for Hep A and Hep B. s/p treatment of HCV with eradication  . Depression   . GERD (gastroesophageal reflux disease)   . Gonorrhea   . Hypertension   . Intracranial hemorrhage ([place]) 1998   left thalmic  . Intussusception ([place]) 04/2012  . Polysubstance abuse ([place])    HX of  . PTSD (post-traumatic stress disorder)   . Pulmonary nodules   . S/P colonoscopy 2005   Dr Moss Mc polyp removed, otherwise normal  . S/P endoscopy 08/27/10   antral erosions, otherwise normal, due egd 08/2012 to screen for varices  . Seizures ([place]) 1998   with brain bleed x1. None since then and on no meds  . Shortness of breath   . Tobacco abuse     Past Surgical History:    Procedure Laterality Date  . biliary stone removal  2015  . BIOPSY N/A 03/06/2015   Procedure: BIOPSY;  Surgeon: Daneil Dolin, MD;  Location: [facility];  Service: Endoscopy;  Laterality: N/A;  . CARPAL TUNNEL RELEASE Bilateral 12/2010  . CATARACT EXTRACTION W/PHACO Right 01/15/2016   Procedure: CATARACT EXTRACTION PHACO AND INTRAOCULAR LENS PLACEMENT RIGHT EYE CDE=5.88;  Surgeon: Tonny Branch, MD;  Location: [facility];  Service: Ophthalmology;  Laterality: Right;  . CATARACT EXTRACTION W/PHACO Left 02/12/2016   Procedure: CATARACT EXTRACTION PHACO AND INTRAOCULAR LENS PLACEMENT LEFT EYE; CDE:  6.02;  Surgeon: Tonny Branch, MD;  Location: [facility];  Service: Ophthalmology;  Laterality: Left;  . CHOLECYSTECTOMY  2002  . COLONOSCOPY    09/10/2003   diminutive polyp in the rectum cold biopsied/removed/ Normal colon  . COLONOSCOPY WITH PROPOFOL N/A 08/02/2013   BJS:[id number] diverticulosis. next TCS 10 years.  . ESOPHAGOGASTRODUODENOSCOPY    09/10/2003   Small hiatal hernia; otherwise normal stomach, normal D1 and D2  . ESOPHAGOGASTRODUODENOSCOPY  08/27/2010   OHY:WVPXTG esophagus.  No varices.  Couple of tiny antral erosions of doubtful clinical significance, otherwise normal stomach, D1 and D2.  . ESOPHAGOGASTRODUODENOSCOPY (EGD) WITH PROPOFOL N/A 08/02/2013   RMR: Portal gastropathy. No explanation for abdominal pain which I believe is more abdominal wall in  origin. She has been seen by Dr. Carlis Abbott  over at [facility]. She is up-to-date on cross sectional imaging. She has a pain management physician in [place]. Repeat EGD for varices in 3 years.  . ESOPHAGOGASTRODUODENOSCOPY (EGD) WITH PROPOFOL N/A 03/06/2015   PNT:IRWERXVQ gastric mucosac s/p bx  . ESOPHAGOGASTRODUODENOSCOPY (EGD) WITH PROPOFOL N/A 04/18/2017   normal esophagus, portal hypertensive gastropathy, normal duodenum, no specimens collected. Surveillance in Aug 2020  . LUMBAR FUSION    . NOSE SURGERY  1970  . PERCUTANEOUS TRANSHEPATIC  CHOLANGIOGRAHPY AND BILLIARY DRAINAGE  2002  . REPAIR VAGINAL CUFF N/A 10/25/2015   Procedure: REPAIR VAGINAL LACERATION ;  Surgeon: Jonnie Kind, MD;  Location: [facility];  Service: Gynecology;  Laterality: N/A;  . Roux-en-y-hepatojejunostomy  2003   revision in 2013 for intussusception [facility] Dr. Bailey Mech)  . SPINAL FUSION     C5-C7    Current Outpatient Medications  Medication Sig Dispense Refill  . acyclovir (ZOVIRAX) 400 MG tablet TAKE 1 TABLET BY MOUTH TWICE DAILY. 180 tablet 3  . albuterol (PROVENTIL) (2.5 MG/3ML) 0.083% nebulizer solution Take 2.5 mg by nebulization 2 (two) times daily as needed for wheezing or shortness of breath. Uses it average 3 times week    . atenolol (TENORMIN) 25 MG tablet Take 25 mg by mouth every morning.     . Calcium-Magnesium-Vitamin D (CALCIUM 500 PO) Take 1 tablet by mouth daily.    . Cyanocobalamin (VITAMIN B 12 PO) Take 1 tablet by mouth daily.    . DULoxetine (CYMBALTA) 60 MG capsule Take 60 mg by mouth daily.     Marland Kitchen EPIPEN 2-PAK 0.3 MG/0.3ML SOAJ injection Inject 0.3 mg into the skin as needed (allergic reactions).     . ferrous sulfate 325 (65 FE) MG tablet Take 325 mg by mouth daily with breakfast.    . fluticasone (FLONASE) 50 MCG/ACT nasal spray Place 2 sprays into the nose daily.      . hydrOXYzine (VISTARIL) 50 MG capsule Take 50 mg by mouth 2 (two) times daily.     Marland Kitchen levocetirizine (XYZAL) 5 MG tablet Take 5 mg by mouth daily as needed for allergies.     Marland Kitchen loratadine (CLARITIN) 10 MG tablet Take 10 mg by mouth daily as needed for allergies.    . Omega-3 Fatty Acids (FISH OIL) 1000 MG CAPS Take 1,000 mg by mouth 2 (two) times daily.    Marland Kitchen omeprazole (PRILOSEC) 20 MG capsule TAKE ONE CAPSULE BY MOUTH EVERY DAY 30 capsule 11  . OxyCODONE HCl, Abuse Deter, (OXAYDO) 5 MG TABA Take 1 tablet by mouth 3 (three) times daily.     Marland Kitchen PROAIR HFA 108 (90 BASE) MCG/ACT inhaler Inhale 2 puffs into the lungs every 6 (six) hours as needed for wheezing or  shortness of breath.     . pyridOXINE (VITAMIN B-6) 100 MG tablet Take 100 mg by mouth daily.     No current facility-administered medications for this visit.     Allergies as of 10/26/2018 - Review Complete 07/15/2018  Allergen Reaction Noted  . Asa [aspirin] Nausea Only 04/13/2016  . Bee venom Hives 07/10/2015  . Codeine Nausea Only 02/18/2017  . Tylenol [acetaminophen] Nausea And Vomiting and Swelling 07/10/2015    Family History  Problem Relation Age of Onset  . Coronary artery disease Brother   . Cancer Sister        lymphoma  . Cancer Father        brain    Social  History   Socioeconomic History  . Marital status: Single    Spouse name: Not on file  . Number of children: 1  . Years of education: Not on file  . Highest education level: Not on file  Occupational History  . Occupation: disabled    Fish farm manager: UNEMPLOYED  Social Needs  . Financial resource strain: Not on file  . Food insecurity:    Worry: Not on file    Inability: Not on file  . Transportation needs:    Medical: Not on file    Non-medical: Not on file  Tobacco Use  . Smoking status: Current Every Day Smoker    Packs/day: 0.25    Years: 33.00    Pack years: 8.25    Types: Cigarettes  . Smokeless tobacco: Never Used  Substance and Sexual Activity  . Alcohol use: Not Currently    Alcohol/week: 0.0 standard drinks    Comment: occasionally   . Drug use: No  . Sexual activity: Yes    Partners: Male    Birth control/protection: Post-menopausal, Condom  Lifestyle  . Physical activity:    Days per week: Not on file    Minutes per session: Not on file  . Stress: Not on file  Relationships  . Social connections:    Talks on phone: Not on file    Gets together: Not on file    Attends religious service: Not on file    Active member of club or organization: Not on file    Attends meetings of clubs or organizations: Not on file    Relationship status: Not on file  Other Topics Concern  . Not on  file  Social History Narrative  . Not on file    Review of Systems: General: Negative for anorexia, weight loss, fever, chills, fatigue, weakness. Eyes: Negative for vision changes.  ENT: Negative for hoarseness, difficulty swallowing , nasal congestion. CV: Negative for chest pain, angina, palpitations, dyspnea on exertion, peripheral edema.  Respiratory: Negative for dyspnea at rest, dyspnea on exertion, cough, sputum, wheezing.  GI: See history of present illness. GU:  Negative for dysuria, hematuria, urinary incontinence, urinary frequency, nocturnal urination.  MS: Negative for joint pain, low back pain.  Derm: Negative for rash or itching.  Neuro: Negative for weakness, abnormal sensation, seizure, frequent headaches, memory loss, confusion.  Psych: Negative for anxiety, depression, suicidal ideation, hallucinations.  Endo: Negative for unusual weight change.  Heme: Negative for bruising or bleeding. Allergy: Negative for rash or hives.   Physical Exam: There were no vitals taken for this visit. General:   Alert and oriented. Pleasant and cooperative. Well-nourished and well-developed.  Head:  Normocephalic and atraumatic. Eyes:  Without icterus, sclera clear and conjunctiva pink.  Ears:  Normal auditory acuity. Mouth:  No deformity or lesions, oral mucosa pink.  Throat/Neck:  Supple, without mass or thyromegaly. Cardiovascular:  S1, S2 present without murmurs appreciated. Normal pulses noted. Extremities without clubbing or edema. Respiratory:  Clear to auscultation bilaterally. No wheezes, rales, or rhonchi. No distress.  Gastrointestinal:  +BS, soft, non-tender and non-distended. No HSM noted. No guarding or rebound. No masses appreciated.  Rectal:  Deferred  Musculoskalatal:  Symmetrical without gross deformities. Normal posture. Skin:  Intact without significant lesions or rashes. Neurologic:  Alert and oriented x4;  grossly normal neurologically. Psych:  Alert and  cooperative. Normal mood and affect. Heme/Lymph/Immune: No significant cervical adenopathy. No excessive bruising noted.    10/26/2018 8:02 AM   Disclaimer: This note was  dictated with voice recognition software. Similar sounding words can inadvertently be transcribed and may not be corrected upon review.

## 2018-11-08 ENCOUNTER — Ambulatory Visit (HOSPITAL_COMMUNITY): Payer: Medicare Other

## 2018-11-23 ENCOUNTER — Ambulatory Visit (HOSPITAL_COMMUNITY): Payer: Medicare Other

## 2018-11-24 ENCOUNTER — Emergency Department (HOSPITAL_COMMUNITY)
Admission: EM | Admit: 2018-11-24 | Discharge: 2018-11-24 | Disposition: A | Discharge status: Home / Self Care | Payer: Medicare Other | Attending: Emergency Medicine | Admitting: Emergency Medicine

## 2018-11-24 ENCOUNTER — Emergency Department (HOSPITAL_COMMUNITY): Payer: Medicare Other

## 2018-11-24 ENCOUNTER — Other Ambulatory Visit: Payer: Self-pay

## 2018-11-24 DIAGNOSIS — I10 Essential (primary) hypertension: Secondary | ICD-10-CM | POA: Insufficient documentation

## 2018-11-24 DIAGNOSIS — F10920 Alcohol use, unspecified with intoxication, uncomplicated: Secondary | ICD-10-CM | POA: Insufficient documentation

## 2018-11-24 DIAGNOSIS — F17210 Nicotine dependence, cigarettes, uncomplicated: Secondary | ICD-10-CM | POA: Diagnosis not present

## 2018-11-24 DIAGNOSIS — Z79899 Other long term (current) drug therapy: Secondary | ICD-10-CM | POA: Diagnosis not present

## 2018-11-24 DIAGNOSIS — R0602 Shortness of breath: Secondary | ICD-10-CM | POA: Diagnosis present

## 2018-11-24 NOTE — ED Provider Notes (Signed)
[facility] EMERGENCY DEPARTMENT Provider Note   CSN: [id number] Arrival date & time: 11/24/18  8588    History   Chief Complaint Chief Complaint  Patient presents with  . Shortness of Breath    HPI Sabrina Wyatt is a 68 y.o. female.  The history is provided by the patient.  Shortness of Breath  She has history of chronic back pain, cirrhosis, alcohol abuse, hypertension and was brought here by ambulance.  Triage note states shortness of breath which started yesterday, patient is actually denying shortness of breath when I talked with her.  She is also denying chest pain.  She does admit to ongoing chronic back pain.  She admits to drinking a bottle wine tonight.  She is very evasive about how often she will drink but does admit that she had been drinking yesterday as well.  Past Medical History:  Diagnosis Date  . Abdominal wall pain    chronic  . Anemia   . Arthritis    rheumatoid  . Biliary stone   . Chronic abdominal pain   . Chronic flank pain   . Chronic low back pain   . Cirrhosis, hepatitis C    U/S on 03/09/16= no HCC, pt has been vaccinated for Hep A and Hep B. s/p treatment of HCV with eradication  . Depression   . GERD (gastroesophageal reflux disease)   . Gonorrhea   . Hypertension   . Intracranial hemorrhage ([place]) 1998   left thalmic  . Intussusception ([place]) 04/2012  . Polysubstance abuse ([place])    HX of  . PTSD (post-traumatic stress disorder)   . Pulmonary nodules   . S/P colonoscopy 2005   Dr Moss Mc polyp removed, otherwise normal  . S/P endoscopy 08/27/10   antral erosions, otherwise normal, due egd 08/2012 to screen for varices  . Seizures ([place]) 1998   with brain bleed x1. None since then and on no meds  . Shortness of breath   . Tobacco abuse     Patient Active Problem List   Diagnosis Date Noted  . 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 Screening for STD (sexually transmitted disease) 12/19/2015  . Vaginal laceration 10/25/2015  . Acute  sinusitis with symptoms > 10 days 08/04/2015  . Epigastric mass 04/16/2015  . Abdominal swelling 04/16/2015  . Mucosal abnormality of stomach   . Nausea without vomiting 02/17/2015  . Loss of weight 02/17/2015  . Chronic folliculitis 50/27/7412  . 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 Insomnia 03/12/2014  . Folliculitis 87/86/7672  . Cirrhosis of liver ([place]) 11/15/2013  . Lower extremity edema 11/15/2013  . Abdominal pain, chronic, epigastric 07/10/2013  . Anorexia nervosa 07/10/2013  . 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 Muscle weakness (generalized) 12/28/2011  . CARPAL TUNNEL SYNDROME 10/16/2008  . ABDOMINAL BLOATING 10/16/2008  . Alcohol abuse 04/25/2008  . TOBACCO ABUSE 04/25/2008  . POST TRAUMATIC STRESS SYNDROME 04/22/2008  . DEPRESSION 04/22/2008  . INTRACRANIAL HEMORRHAGE 04/22/2008  . Esophageal reflux 04/22/2008  . INTUSSUSCEPTION 04/22/2008  . ABDOMINAL ADHESIONS 04/22/2008  . Hepatic cirrhosis due to chronic hepatitis C infection ([place]) 04/22/2008  . OBSTRUCTION OF BILE DUCT 04/22/2008  . LOW BACK PAIN, CHRONIC sees [place] [facility]. 04/22/2008  . Abdominal pain 04/22/2008  . ABDOMINAL PAIN OTHER SPECIFIED SITE 04/22/2008  . HEPATITIS C, HX OF 04/22/2008    Past Surgical History:  Procedure Laterality Date  . biliary stone removal  2015  . BIOPSY N/A 03/06/2015   Procedure: BIOPSY;  Surgeon: Daneil Dolin, MD;  Location: [facility];  Service: Endoscopy;  Laterality: N/A;  . CARPAL TUNNEL RELEASE Bilateral 12/2010  . CATARACT EXTRACTION W/PHACO Right 01/15/2016   Procedure: CATARACT EXTRACTION PHACO AND INTRAOCULAR LENS PLACEMENT RIGHT EYE CDE=5.88;  Surgeon: Tonny Branch, MD;  Location: [facility];  Service: Ophthalmology;  Laterality: Right;  . CATARACT EXTRACTION W/PHACO Left 02/12/2016   Procedure: CATARACT EXTRACTION PHACO AND INTRAOCULAR LENS PLACEMENT LEFT EYE; CDE:   6.02;  Surgeon: Tonny Branch, MD;  Location: [facility];  Service: Ophthalmology;  Laterality: Left;  . CHOLECYSTECTOMY  2002  . COLONOSCOPY    09/10/2003   diminutive polyp in the rectum cold biopsied/removed/ Normal colon  . COLONOSCOPY WITH PROPOFOL N/A 08/02/2013   WLN:LGXQJJH diverticulosis. next TCS 10 years.  . ESOPHAGOGASTRODUODENOSCOPY    09/10/2003   Small hiatal hernia; otherwise normal stomach, normal D1 and D2  . ESOPHAGOGASTRODUODENOSCOPY  08/27/2010   ERD:EYCXKG esophagus.  No varices.  Couple of tiny antral erosions of doubtful clinical significance, otherwise normal stomach, D1 and D2.  . ESOPHAGOGASTRODUODENOSCOPY (EGD) WITH PROPOFOL N/A 08/02/2013   RMR: Portal gastropathy. No explanation for abdominal pain which I believe is more abdominal wall in origin. She has been seen by Dr. Carlis Abbott  over at [facility]. She is up-to-date on cross sectional imaging. She has a pain management physician in [place]. Repeat EGD for varices in 3 years.  . ESOPHAGOGASTRODUODENOSCOPY (EGD) WITH PROPOFOL N/A 03/06/2015   YJE:HUDJSHFW gastric mucosac s/p bx  . ESOPHAGOGASTRODUODENOSCOPY (EGD) WITH PROPOFOL N/A 04/18/2017   normal esophagus, portal hypertensive gastropathy, normal duodenum, no specimens collected. Surveillance in Aug 2020  . LUMBAR FUSION    . NOSE SURGERY  1970  . PERCUTANEOUS TRANSHEPATIC CHOLANGIOGRAHPY AND BILLIARY DRAINAGE  2002  . REPAIR VAGINAL CUFF N/A 10/25/2015   Procedure: REPAIR VAGINAL LACERATION ;  Surgeon: Jonnie Kind, MD;  Location: [facility];  Service: Gynecology;  Laterality: N/A;  . Roux-en-y-hepatojejunostomy  2003   revision in 2013 for intussusception [facility] Dr. Bailey Mech)  . SPINAL FUSION     C5-C7     OB History    Gravida  2   Para  1   Term  1   Preterm      AB  1   Living  1     SAB      TAB      Ectopic  1   Multiple      Live Births               Home Medications    Prior to Admission medications   Medication Sig Start  Date End Date Taking? Authorizing Provider  acyclovir (ZOVIRAX) 400 MG tablet TAKE 1 TABLET BY MOUTH TWICE DAILY. 08/07/18   Jonnie Kind, MD  albuterol (PROVENTIL) (2.5 MG/3ML) 0.083% nebulizer solution Take 2.5 mg by nebulization 2 (two) times daily as needed for wheezing or shortness of breath. Uses it average 3 times week    [provider]  atenolol (TENORMIN) 25 MG tablet Take 25 mg by mouth every morning.     [provider]  Calcium-Magnesium-Vitamin D (CALCIUM 500 PO) Take 1 tablet by mouth daily.    [provider]  Cyanocobalamin (VITAMIN B 12 PO) Take 1 tablet by mouth daily.    [provider]  DULoxetine (CYMBALTA) 60 MG capsule Take 60 mg by mouth daily.     [provider]  EPIPEN 2-PAK 0.3 MG/0.3ML SOAJ injection Inject 0.3 mg into the skin as needed (allergic reactions).  03/04/13   [provider]  ferrous sulfate 325 (65 FE) MG tablet Take 325 mg by mouth daily with breakfast.    [provider]  fluticasone (FLONASE) 50 MCG/ACT nasal spray Place 2 sprays into the nose daily.      [provider]  hydrOXYzine (VISTARIL) 50 MG capsule Take 50 mg by mouth 2 (two) times daily.     [provider]  levocetirizine (XYZAL) 5 MG tablet Take 5 mg by mouth daily as needed for allergies.  10/09/15   [provider]  loratadine (CLARITIN) 10 MG tablet Take 10 mg by mouth daily as needed for allergies.    [provider]  Omega-3 Fatty Acids (FISH OIL) 1000 MG CAPS Take 1,000 mg by mouth 2 (two) times daily.    [provider]  omeprazole (PRILOSEC) 20 MG capsule TAKE ONE CAPSULE BY MOUTH EVERY DAY 09/10/12   Andria Meuse, NP  OxyCODONE HCl, Abuse Deter, (OXAYDO) 5 MG TABA Take 1 tablet by mouth 3 (three) times daily.     [provider]  PROAIR HFA 108 (90 BASE) MCG/ACT inhaler Inhale 2 puffs into the lungs every 6 (six) hours as needed for wheezing or shortness of  breath.  02/21/12   [provider]  pyridOXINE (VITAMIN B-6) 100 MG tablet Take 100 mg by mouth daily.    [provider]    Family History Family History  Problem Relation Age of Onset  . Coronary artery disease Brother   . Cancer Sister        lymphoma  . Cancer Father        brain    Social History Social History   Tobacco Use  . Smoking status: Current Every Day Smoker    Packs/day: 0.25    Years: 33.00    Pack years: 8.25    Types: Cigarettes  . Smokeless tobacco: Never Used  Substance Use Topics  . Alcohol use: Not Currently    Alcohol/week: 0.0 standard drinks    Comment: occasionally   . Drug use: No     Allergies   Asa [aspirin]; Bee venom; Codeine; and Tylenol [acetaminophen]   Review of Systems Review of Systems  Respiratory: Positive for shortness of breath.   All other systems reviewed and are negative.    Physical Exam Updated Vital Signs BP (!) 143/82 (BP Location: Left Arm)   Pulse 87   Temp 98.4 F (36.9 C) (Oral)   Resp 16   Ht 5\' 5"  (1.651 m)   Wt 74.8 kg   SpO2 98%   BMI 27.46 kg/m   Physical Exam Vitals signs and nursing note reviewed.    68 year old female, resting comfortably and in no acute distress. Vital signs are significant for mildly elevated systolic blood pressure. Oxygen saturation is 98%, which is normal. Head is normocephalic and atraumatic. PERRLA, EOMI. Oropharynx is clear. Neck is nontender and supple without adenopathy or JVD. Back is nontender and there is no CVA tenderness. Lungs are clear without rales, wheezes, or rhonchi. Chest is nontender. Heart has regular rate and rhythm without murmur. Abdomen is soft, flat, nontender without masses or hepatosplenomegaly and peristalsis is normoactive. Extremities have no cyanosis or edema, full range of motion is present. Skin is warm and dry without rash. Neurologic: Awake and alert but clinically intoxicated, cranial nerves are intact, there are  no motor or sensory deficits.  ED Treatments / Results  Labs (all labs ordered are listed, but only abnormal results are displayed) Labs Reviewed - No data to display  EKG EKG Interpretation  Date/Time:  Friday November 24 2018 02:45:28 EDT Ventricular Rate:  90 PR Interval:    QRS Duration: 84 QT Interval:  357 QTC Calculation: 437 R Axis:   62 Text Interpretation:  Sinus rhythm Low voltage, precordial leads Baseline wander in lead(s) V4 When compared with ECG of 07/15/2018, T wave inversion Inferior leads is no longer present Confirmed by Delora Fuel ([id number]) on 11/24/2018 2:49:25 AM   Radiology No results found.  Procedures Procedures  Medications Ordered in ED Medications - No data to display   Initial Impression / Assessment and Plan / ED Course  I have reviewed the triage vital signs and the nursing notes.  Pertinent labs & imaging results that were available during my care of the patient were reviewed by me and considered in my medical decision making (see chart for details).   Alcohol intoxication.  Old records are reviewed confirming history of cirrhosis and varices.  She does not appear to be in any distress, but screening labs are ordered.  ECG shows no acute changes.  Before labs or x-ray could be obtained, patient stated that she wished to leave.  Since there did not appear to be any acute process going on, other than being drunk, she was discharged without getting any labs or x-rays.  Final Clinical Impressions(s) / ED Diagnoses   Final diagnoses:  Alcohol intoxication, uncomplicated [facility])    ED Discharge Orders    None       Delora Fuel, MD 07/37/10 [phone]

## 2018-11-24 NOTE — Discharge Instructions (Addendum)
You have a diagnosis of cirrhosis of the liver. You should not drink anything with alcohol in it, because it can hurt your liver.

## 2018-11-24 NOTE — ED Triage Notes (Signed)
Pt admits that she may have drank too much tonight

## 2018-11-24 NOTE — ED Triage Notes (Signed)
Pt called ems for sob onset yesterday.  Pt is groggy, states she has chronic pain in her lower back and takes oxycodone for same.  Pt denies other areas of pain,  nad

## 2018-11-24 NOTE — ED Notes (Signed)
Pt states she wants to go home, her boyfriend is coming to pick her up. MD notified. Pt seen walking out of ED to waiting room.

## 2018-12-15 ENCOUNTER — Ambulatory Visit (HOSPITAL_COMMUNITY): Payer: Medicare Other

## 2018-12-21 ENCOUNTER — Other Ambulatory Visit: Payer: Self-pay

## 2018-12-21 ENCOUNTER — Ambulatory Visit (HOSPITAL_COMMUNITY)
Admission: RE | Admit: 2018-12-21 | Discharge: 2018-12-21 | Disposition: A | Discharge status: Home / Self Care | Payer: Medicare Other | Source: Ambulatory Visit | Attending: Gastroenterology | Admitting: Gastroenterology

## 2018-12-21 DIAGNOSIS — K7460 Unspecified cirrhosis of liver: Secondary | ICD-10-CM | POA: Diagnosis not present

## 2018-12-27 NOTE — Progress Notes (Signed)
[place] with known cirrhosis. Portal vein patent. Mardelle Matte, patient seeing you 5/11.

## 2019-01-01 ENCOUNTER — Other Ambulatory Visit: Payer: Self-pay

## 2019-01-01 ENCOUNTER — Encounter: Payer: Self-pay | Admitting: Nurse Practitioner

## 2019-01-01 ENCOUNTER — Ambulatory Visit (INDEPENDENT_AMBULATORY_CARE_PROVIDER_SITE_OTHER): Payer: Medicare Other | Admitting: Nurse Practitioner

## 2019-01-01 DIAGNOSIS — K7460 Unspecified cirrhosis of liver: Secondary | ICD-10-CM | POA: Diagnosis not present

## 2019-01-01 DIAGNOSIS — K219 Gastro-esophageal reflux disease without esophagitis: Secondary | ICD-10-CM | POA: Diagnosis not present

## 2019-01-01 NOTE — Patient Instructions (Signed)
Your health issues we discussed today were:   Cirrhosis: 1. Have your labs done when you are able to 2. We will discuss scheduling your follow-up upper endoscopy at your next office visit 3. Call us if you have any concerning symptoms or worsening symptoms  GERD (reflux/heartburn): 1. Continue taking your acid blocker 2. Call us if you have any severe worsening symptoms  Overall I recommend:  1. Continue your other current medications 2. Call us if you have any questions or concerns 3. Follow-up in 6 months   Because of recent events of COVID-19 ("Coronavirus"), follow CDC recommendations:  1. Wash your hand frequently 2. Avoid touching your face 3. Stay away from people who are sick 4. If you have symptoms such as fever, cough, shortness of breath then call your healthcare provider for further guidance 5. If you are sick, STAY AT HOME unless otherwise directed by your healthcare provider. 6. Follow directions from state and national officials regarding staying safe   At [facility] we value your feedback. You may receive a survey about your visit today. Please share your experience as we strive to create trusting relationships with our patients to provide genuine, compassionate, quality care.  We appreciate your understanding and patience as we review any laboratory studies, imaging, and other diagnostic tests that are ordered as we care for you. Our office policy is 5 business days for review of these results, and any emergent or urgent results are addressed in a timely manner for your best interest. If you do not hear from our office in 1 week, please contact us.   We also encourage the use of MyChart, which contains your medical information for your review as well. If you are not enrolled in this feature, an access code is on this after visit summary for your convenience. Thank you for allowing [place] to be involved in your care.  It was great to see you today!  I  hope you have a great day!!

## 2019-01-01 NOTE — Assessment & Plan Note (Signed)
Patient with alcoholic cirrhosis.  She does continue to drink intermittently.  She has been admitted twice in the past 6 months for either alcohol intoxication or fall (with elevated ethanol level).  Recommend she continue to avoid alcohol.  Her liver imaging is up-to-date in April 2020.  We will order routine liver labs at this time.  She will be due for upper endoscopy for variceal screening toward the end of this year.  Continue current medications.  Call us for any concerning or new symptoms.  Follow-up in 6 months for routine liver care and schedule EGD.

## 2019-01-01 NOTE — Progress Notes (Signed)
Referring Provider: Lucia Gaskins, MD Primary Care Physician:  Lucia Gaskins, MD Primary GI:  Dr. Gala Romney   NOTE: Service was provided via telemedicine and was requested by the patient due to COVID-19 pandemic.  Method of visit: Telephone (failed Doxy.Me and Facetime)  Patient Location: Home  Provider Location: Office  Reason for Phone Visit: Follow-up  The patient was consented to phone follow-up via telephone encounter including billing of the encounter (yes/no): Yes  Persons present on the phone encounter, with roles: None  Total time (minutes) spent on medical discussion: 25 minutes  Chief Complaint  Patient presents with  . Cirrhosis    f/u. Doing okay    HPI:   Sabrina Wyatt is a 68 y.o. female who presents for virtual visit regarding: follow-up on Cirrhosis.  The patient was last seen in our office 03/22/2018 for cirrhosis.  Noted history of alcoholic cirrhosis, status post hepatitis C treatment in the past.  Notes alcohol "once in a blue moon".  No overt GI complaints.  Noted presentation to the ER July 6 with alcohol intoxication.  Recommended strict alcohol avoidance, ultrasound in September 2019, follow-up in 6 months.  Most recent liver imaging dated 12/21/2018 as right upper quadrant ultrasound found cirrhosis but no focal hepatic mass.   Most recent full liver labs including INR and AFP completed 03/29/2018.  Most recent CMP dated 07/15/2018 which found elevated AST at 47, otherwise normal LFTs.  CBC with low normal platelets at 153.  Has been to the emergency room twice since end of November.  She was in there 07/15/2018 for a fall and had an ethanol level of 272.  She was again in emergency department for 11/10/2018 for alcohol intoxication.  Today she states she's doing ok overall. Denies abdominal pain, N/V, hematochezia, melena, fever, chills, unintentional weight loss. Denies yellowing of skin or eye, darkened urine, acute episodic confusion,  tremors/shankes. GERD symptoms doing well. Denies URI and flu-like symptoms. Denies chest pain, dyspnea, dizziness, lightheadedness, syncope, near syncope. Denies any other upper or lower GI symptoms.  Past Medical History:  Diagnosis Date  . Abdominal wall pain    chronic  . Anemia   . Arthritis    rheumatoid  . Biliary stone   . Chronic abdominal pain   . Chronic flank pain   . Chronic low back pain   . Cirrhosis, hepatitis C    U/S on 03/09/16= no HCC, pt has been vaccinated for Hep A and Hep B. s/p treatment of HCV with eradication  . Depression   . GERD (gastroesophageal reflux disease)   . Gonorrhea   . Hypertension   . 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 Intussusception ([employer]) 04/2012  . Polysubstance abuse ([place])    HX of  . PTSD (post-traumatic stress disorder)   . Pulmonary nodules   . S/P colonoscopy 2005   Dr Moss Mc polyp removed, otherwise normal  . S/P endoscopy 08/27/10   antral erosions, otherwise normal, due egd 08/2012 to screen for varices  . Seizures ([place]) 1998   with brain bleed x1. None since then and on no meds  . Shortness of breath   . Tobacco abuse     Past Surgical History:  Procedure Laterality Date  . biliary stone removal  2015  . BIOPSY N/A 03/06/2015   Procedure: BIOPSY;  Surgeon: Daneil Dolin, MD;  Location: [facility];  Service: Endoscopy;  Laterality: N/A;  . CARPAL TUNNEL RELEASE Bilateral 12/2010  .  CATARACT EXTRACTION W/PHACO Right 01/15/2016   Procedure: CATARACT EXTRACTION PHACO AND INTRAOCULAR LENS PLACEMENT RIGHT EYE CDE=5.88;  Surgeon: Tonny Branch, MD;  Location: [facility];  Service: Ophthalmology;  Laterality: Right;  . CATARACT EXTRACTION W/PHACO Left 02/12/2016   Procedure: CATARACT EXTRACTION PHACO AND INTRAOCULAR LENS PLACEMENT LEFT EYE; CDE:  6.02;  Surgeon: Tonny Branch, MD;  Location: [facility];  Service: Ophthalmology;  Laterality: Left;  . CHOLECYSTECTOMY  2002  . COLONOSCOPY    09/10/2003   diminutive polyp in the  rectum cold biopsied/removed/ Normal colon  . COLONOSCOPY WITH PROPOFOL N/A 08/02/2013   SEG:BTDVVOH diverticulosis. next TCS 10 years.  . ESOPHAGOGASTRODUODENOSCOPY    09/10/2003   Small hiatal hernia; otherwise normal stomach, normal D1 and D2  . ESOPHAGOGASTRODUODENOSCOPY  08/27/2010   YWV:PXTGGY esophagus.  No varices.  Couple of tiny antral erosions of doubtful clinical significance, otherwise normal stomach, D1 and D2.  . ESOPHAGOGASTRODUODENOSCOPY (EGD) WITH PROPOFOL N/A 08/02/2013   RMR: Portal gastropathy. No explanation for abdominal pain which I believe is more abdominal wall in origin. She has been seen by Dr. Carlis Abbott  over at [facility]. She is up-to-date on cross sectional imaging. She has a pain management physician in [place]. Repeat EGD for varices in 3 years.  . ESOPHAGOGASTRODUODENOSCOPY (EGD) WITH PROPOFOL N/A 03/06/2015   IRS:WNIOEVOJ gastric mucosac s/p bx  . ESOPHAGOGASTRODUODENOSCOPY (EGD) WITH PROPOFOL N/A 04/18/2017   normal esophagus, portal hypertensive gastropathy, normal duodenum, no specimens collected. Surveillance in Aug 2020  . LUMBAR FUSION    . NOSE SURGERY  1970  . PERCUTANEOUS TRANSHEPATIC CHOLANGIOGRAHPY AND BILLIARY DRAINAGE  2002  . REPAIR VAGINAL CUFF N/A 10/25/2015   Procedure: REPAIR VAGINAL LACERATION ;  Surgeon: Jonnie Kind, MD;  Location: [facility];  Service: Gynecology;  Laterality: N/A;  . Roux-en-y-hepatojejunostomy  2003   revision in 2013 for intussusception [facility] Dr. Bailey Mech)  . SPINAL FUSION     C5-C7    Current Outpatient Medications  Medication Sig Dispense Refill  . acyclovir (ZOVIRAX) 400 MG tablet TAKE 1 TABLET BY MOUTH TWICE DAILY. 180 tablet 3  . albuterol (PROVENTIL) (2.5 MG/3ML) 0.083% nebulizer solution Take 2.5 mg by nebulization 2 (two) times daily as needed for wheezing or shortness of breath. Uses it average 3 times week    . atenolol (TENORMIN) 25 MG tablet Take 25 mg by mouth every morning.     .  Calcium-Magnesium-Vitamin D (CALCIUM 500 PO) Take 1 tablet by mouth daily.    . DULoxetine (CYMBALTA) 60 MG capsule Take 60 mg by mouth daily.     Marland Kitchen EPIPEN 2-PAK 0.3 MG/0.3ML SOAJ injection Inject 0.3 mg into the skin as needed (allergic reactions).     . ferrous sulfate 325 (65 FE) MG tablet Take 325 mg by mouth daily with breakfast.    . fluticasone (FLONASE) 50 MCG/ACT nasal spray Place 2 sprays into the nose daily.      . hydrOXYzine (VISTARIL) 50 MG capsule Take 50 mg by mouth 2 (two) times daily.     Marland Kitchen levocetirizine (XYZAL) 5 MG tablet Take 5 mg by mouth daily as needed for allergies.     Marland Kitchen loratadine (CLARITIN) 10 MG tablet Take 10 mg by mouth daily as needed for allergies.    . Omega-3 Fatty Acids (FISH OIL) 1000 MG CAPS Take 1,000 mg by mouth 2 (two) times daily.    Marland Kitchen omeprazole (PRILOSEC) 20 MG capsule TAKE ONE CAPSULE BY MOUTH EVERY DAY 30 capsule 11  .  OxyCODONE HCl, Abuse Deter, (OXAYDO) 5 MG TABA Take 1 tablet by mouth 4 (four) times daily.     Marland Kitchen PROAIR HFA 108 (90 BASE) MCG/ACT inhaler Inhale 2 puffs into the lungs every 6 (six) hours as needed for wheezing or shortness of breath.     . pyridOXINE (VITAMIN B-6) 100 MG tablet Take 100 mg by mouth daily.     No current facility-administered medications for this visit.     Allergies as of 01/01/2019 - Review Complete 01/01/2019  Allergen Reaction Noted  . Asa [aspirin] Nausea Only 04/13/2016  . Bee venom Hives 07/10/2015  . Codeine Nausea Only 02/18/2017  . Tylenol [acetaminophen] Nausea And Vomiting and Swelling 07/10/2015    Family History  Problem Relation Age of Onset  . Coronary artery disease Brother   . Cancer Sister        lymphoma  . Cancer Father        brain    Social History   Socioeconomic History  . Marital status: Single    Spouse name: Not on file  . Number of children: 1  . Years of education: Not on file  . Highest education level: Not on file  Occupational History  . Occupation: disabled     Fish farm manager: UNEMPLOYED  Social Needs  . Financial resource strain: Not on file  . Food insecurity:    Worry: Not on file    Inability: Not on file  . Transportation needs:    Medical: Not on file    Non-medical: Not on file  Tobacco Use  . Smoking status: Current Every Day Smoker    Packs/day: 0.25    Years: 33.00    Pack years: 8.25    Types: Cigarettes  . Smokeless tobacco: Never Used  Substance and Sexual Activity  . Alcohol use: Yes    Alcohol/week: 0.0 standard drinks    Comment: occasionally   . Drug use: No  . Sexual activity: Yes    Partners: Male    Birth control/protection: Post-menopausal, Condom  Lifestyle  . Physical activity:    Days per week: Not on file    Minutes per session: Not on file  . Stress: Not on file  Relationships  . Social connections:    Talks on phone: Not on file    Gets together: Not on file    Attends religious service: Not on file    Active member of club or organization: Not on file    Attends meetings of clubs or organizations: Not on file    Relationship status: Not on file  Other Topics Concern  . Not on file  Social History Narrative  . Not on file    Review of Systems: General: Negative for anorexia, weight loss, fever, chills, fatigue, weakness. ENT: Negative for hoarseness, difficulty swallowing. CV: Negative for chest pain, angina, palpitations, peripheral edema.  Respiratory: Negative for dyspnea at rest, cough, sputum, wheezing.  GI: See history of present illness. Derm: Negative for rash or itching.  Neuro: Negative for memory loss, confusion.  Endo: Negative for unusual weight change.  Heme: Negative for bruising or bleeding. Allergy: Negative for rash or hives.  Physical Exam: Note: limited exam due to virtual visit General:   Alert and oriented. Pleasant and cooperative. Ears:  Normal auditory acuity. Skin:  Intact without facial significant lesions or rashes. Neurologic:  Alert and oriented x4 Psych:  Alert  and cooperative. Normal mood and affect.

## 2019-01-01 NOTE — Assessment & Plan Note (Signed)
Reflux currently doing well.  Recommend she continue her PPI.  Follow-up in 6 months.

## 2019-01-01 NOTE — Progress Notes (Signed)
CC'D TO PCP °

## 2019-01-02 ENCOUNTER — Encounter: Payer: Self-pay | Admitting: Internal Medicine

## 2019-01-02 ENCOUNTER — Telehealth: Payer: Self-pay | Admitting: Obstetrics and Gynecology

## 2019-01-02 NOTE — Telephone Encounter (Signed)
televisit will be fine to start.

## 2019-01-02 NOTE — Telephone Encounter (Signed)
Patient called, requesting an appointment.  Stated she is having severe pain on the left side of her ovaries x 4 days.  Please advise.  [employer]  [phone]

## 2019-01-03 NOTE — Telephone Encounter (Signed)
Pt aware that she will have tele visit with Dr. Glo Herring on Monday 5/18

## 2019-01-08 ENCOUNTER — Ambulatory Visit (INDEPENDENT_AMBULATORY_CARE_PROVIDER_SITE_OTHER): Payer: Medicare Other | Admitting: Obstetrics and Gynecology

## 2019-01-08 ENCOUNTER — Other Ambulatory Visit: Payer: Self-pay

## 2019-01-08 ENCOUNTER — Encounter: Payer: Self-pay | Admitting: Obstetrics and Gynecology

## 2019-01-08 ENCOUNTER — Other Ambulatory Visit: Payer: Self-pay | Admitting: Obstetrics and Gynecology

## 2019-01-08 VITALS — Ht 65.0 in | Wt 153.0 lb

## 2019-01-08 DIAGNOSIS — R1032 Left lower quadrant pain: Secondary | ICD-10-CM | POA: Diagnosis not present

## 2019-01-08 NOTE — Progress Notes (Signed)
Pt schedule vaginal u/s on 01-10-19 at 11.15 am, [facility].Advise drink 32 ounce water 30 minutes for ultrasound. Pt understood.Pad CMA

## 2019-01-08 NOTE — Progress Notes (Signed)
Patient ID: Sabrina Wyatt, female   DOB: 05-22-1951, 68 y.o.   MRN: [id number]    TELEHEALTH VIRTUAL GYNECOLOGY VISIT ENCOUNTER NOTE  I connected with Sabrina Wyatt on 01/08/2019 at 10:00 AM EDT by telephone at home and verified that I am speaking with the correct person using two identifiers.   I discussed the limitations, risks, security and privacy concerns of performing an evaluation and management service by telephone and the availability of in person appointments. I also discussed with the patient that there may be a patient responsible charge related to this service. The patient expressed understanding and agreed to proceed.   History:  Sabrina Wyatt is a 68 y.o. [id number] female being evaluated today for strong, sharp grabbing and squeezing pain on by the ovary that drops her to her knees. If she hold stomach where ovary is the pain ceases but if she lets go the pain builds back up. Denies any bulges or knots in the area. She denies any abnormal vaginal discharge, bleeding, or other concerns.       Past Medical History:  Diagnosis Date  . Abdominal wall pain    chronic  . Anemia   . Arthritis    rheumatoid  . Biliary stone   . Chronic abdominal pain   . Chronic flank pain   . Chronic low back pain   . Cirrhosis, hepatitis C    U/S on 03/09/16= no HCC, pt has been vaccinated for Hep A and Hep B. s/p treatment of HCV with eradication  . Depression   . GERD (gastroesophageal reflux disease)   . Gonorrhea   . Hypertension   . Intracranial hemorrhage ([place]) 1998   left thalmic  . Intussusception ([place]) 04/2012  . Polysubstance abuse ([place])    HX of  . PTSD (post-traumatic stress disorder)   . Pulmonary nodules   . S/P colonoscopy 2005   Dr Moss Mc polyp removed, otherwise normal  . S/P endoscopy 08/27/10   antral erosions, otherwise normal, due egd 08/2012 to screen for varices  . Seizures ([place]) 1998   with brain bleed x1. None since then and on no meds  . Shortness of  breath   . Tobacco abuse    Past Surgical History:  Procedure Laterality Date  . biliary stone removal  2015  . BIOPSY N/A 03/06/2015   Procedure: BIOPSY;  Surgeon: Daneil Dolin, MD;  Location: [facility];  Service: Endoscopy;  Laterality: N/A;  . CARPAL TUNNEL RELEASE Bilateral 12/2010  . CATARACT EXTRACTION W/PHACO Right 01/15/2016   Procedure: CATARACT EXTRACTION PHACO AND INTRAOCULAR LENS PLACEMENT RIGHT EYE CDE=5.88;  Surgeon: Tonny Branch, MD;  Location: [facility];  Service: Ophthalmology;  Laterality: Right;  . CATARACT EXTRACTION W/PHACO Left 02/12/2016   Procedure: CATARACT EXTRACTION PHACO AND INTRAOCULAR LENS PLACEMENT LEFT EYE; CDE:  6.02;  Surgeon: Tonny Branch, MD;  Location: [facility];  Service: Ophthalmology;  Laterality: Left;  . CHOLECYSTECTOMY  2002  . COLONOSCOPY    09/10/2003   diminutive polyp in the rectum cold biopsied/removed/ Normal colon  . COLONOSCOPY WITH PROPOFOL N/A 08/02/2013   NOM:VEHMCNO diverticulosis. next TCS 10 years.  . ESOPHAGOGASTRODUODENOSCOPY    09/10/2003   Small hiatal hernia; otherwise normal stomach, normal D1 and D2  . ESOPHAGOGASTRODUODENOSCOPY  08/27/2010   BSJ:GGEZMO esophagus.  No varices.  Couple of tiny antral erosions of doubtful clinical significance, otherwise normal stomach, D1 and D2.  . ESOPHAGOGASTRODUODENOSCOPY (EGD) WITH PROPOFOL N/A 08/02/2013   RMR: Portal gastropathy. No  explanation for abdominal pain which I believe is more abdominal wall in origin. She has been seen by Dr. Carlis Abbott  over at [facility]. She is up-to-date on cross sectional imaging. She has a pain management physician in [place]. Repeat EGD for varices in 3 years.  . ESOPHAGOGASTRODUODENOSCOPY (EGD) WITH PROPOFOL N/A 03/06/2015   ZJI:RCVELFYB gastric mucosac s/p bx  . ESOPHAGOGASTRODUODENOSCOPY (EGD) WITH PROPOFOL N/A 04/18/2017   normal esophagus, portal hypertensive gastropathy, normal duodenum, no specimens collected. Surveillance in Aug 2020  . LUMBAR FUSION    . NOSE  SURGERY  1970  . PERCUTANEOUS TRANSHEPATIC CHOLANGIOGRAHPY AND BILLIARY DRAINAGE  2002  . REPAIR VAGINAL CUFF N/A 10/25/2015   Procedure: REPAIR VAGINAL LACERATION ;  Surgeon: Jonnie Kind, MD;  Location: [facility];  Service: Gynecology;  Laterality: N/A;  . Roux-en-y-hepatojejunostomy  2003   revision in 2013 for intussusception [facility] Dr. Bailey Mech)  . SPINAL FUSION     C5-C7   The following portions of the patient's history were reviewed and updated as appropriate: allergies, current medications, past family history, past medical history, past social history, past surgical history and problem list.   Health Maintenance:  Normal pap and negative HRHPV on 05/16/2018.    Review of Systems:  Pertinent items noted in HPI and remainder of comprehensive ROS otherwise negative.  Physical Exam:   General:  Alert, oriented and cooperative.   Mental Status: Normal mood and affect perceived. Normal judgment and thought content.  Physical exam deferred due to nature of the encounter  Labs and Imaging No results found for this or any previous visit (from the past 336 hour(s)). US Abdomen Limited Ruq  Result Date: 12/21/2018 CLINICAL DATA:  Cirrhosis EXAM: ULTRASOUND ABDOMEN LIMITED RIGHT UPPER QUADRANT COMPARISON:  CT abdomen 07/15/2018 FINDINGS: Gallbladder: Prior cholecystectomy. Common bile duct: Diameter: 2.8 mm Liver: No focal hepatic mass. Nodular contour of the liver with a heterogeneous echotexture as can be seen with cirrhosis. No perihepatic ascites. Portal vein is patent on color Doppler imaging with normal direction of blood flow towards the liver. IMPRESSION: 1. Cirrhosis.  No focal hepatic mass. Electronically Signed   By: Kathreen Devoid   On: 12/21/2018 09:57      Assessment and Plan:     A:  1. Colicy LLQ abdominal pain 2/ s/p recent normal CT abd/pelvis 06/2018    P:  1. Vaginal u/s at [facility] as pt is anxious to be sure no changes noted.  I discussed the assessment and  treatment plan with the patient.  I consider the likelihood of changes in findings to be low. Pt aware of this .  The patient was provided an opportunity to ask questions and all were answered. The patient agreed with the plan and demonstrated an understanding of the instructions.   The patient was advised to call back or seek an in-person evaluation/go to the ED if the symptoms worsen or if the condition fails to improve as anticipated.  I provided 12 minutes of non-face-to-face time during this encounter.   By signing my name below, I, Samul Dada, attest that this documentation has been prepared under the direction and in the presence of Jonnie Kind, MD. Electronically Signed: [place]. 01/08/19. 10:26 AM.  I personally performed the services described in this documentation, which was SCRIBED in my presence. The recorded information has been reviewed and considered accurate. It has been edited as necessary during review. Jonnie Kind, MD

## 2019-01-09 ENCOUNTER — Telehealth: Payer: Self-pay | Admitting: Obstetrics and Gynecology

## 2019-01-09 ENCOUNTER — Other Ambulatory Visit: Payer: Self-pay | Admitting: Obstetrics and Gynecology

## 2019-01-09 DIAGNOSIS — R1032 Left lower quadrant pain: Secondary | ICD-10-CM

## 2019-01-09 NOTE — Telephone Encounter (Signed)
Marlita with Radiology at [facility] is adding an order for a pelvis complete for this pt and requesting you to sign before pt comes in at 11:30am.

## 2019-01-10 ENCOUNTER — Other Ambulatory Visit: Payer: Self-pay

## 2019-01-10 ENCOUNTER — Ambulatory Visit (HOSPITAL_COMMUNITY)
Admission: RE | Admit: 2019-01-10 | Discharge: 2019-01-10 | Disposition: A | Discharge status: Home / Self Care | Payer: Medicare Other | Source: Ambulatory Visit | Attending: Obstetrics and Gynecology | Admitting: Obstetrics and Gynecology

## 2019-01-10 DIAGNOSIS — R1032 Left lower quadrant pain: Secondary | ICD-10-CM | POA: Diagnosis not present

## 2019-01-11 ENCOUNTER — Telehealth: Payer: Self-pay | Admitting: *Deleted

## 2019-01-11 NOTE — Telephone Encounter (Signed)
-----   Message from Jonnie Kind, MD sent at 01/10/2019  5:37 PM EDT ----- Normal female anatomy in a postmenopausal patient, no suspicion of GYN abnormality leading to abdominal pain.  This again suggests abdominal wall source of discomfort

## 2019-01-11 NOTE — Telephone Encounter (Signed)
Pt aware of [place] results according to Dr. Glo Herring. Pt has appt scheduled with Dr. Glo Herring. Advised to keep that appt. Pt voiced understanding. [place]

## 2019-01-16 ENCOUNTER — Telehealth: Payer: Self-pay | Admitting: Obstetrics and Gynecology

## 2019-01-16 ENCOUNTER — Ambulatory Visit: Payer: Medicare Other | Admitting: Obstetrics and Gynecology

## 2019-01-16 NOTE — Telephone Encounter (Signed)
Called patient to screen her for the Coronavirus, pt states that she has not been in contact with any one who might be suspected or confirmed to have the virus. Pt states that she does not have any symptoms of the virus and she has not travel out of the [place]. Pt was advised to bring a mask for her appointment and no visitors are allowed.

## 2019-01-17 ENCOUNTER — Other Ambulatory Visit: Payer: Self-pay

## 2019-01-17 ENCOUNTER — Encounter: Payer: Self-pay | Admitting: Obstetrics and Gynecology

## 2019-01-17 ENCOUNTER — Ambulatory Visit (INDEPENDENT_AMBULATORY_CARE_PROVIDER_SITE_OTHER): Payer: Medicare Other | Admitting: Obstetrics and Gynecology

## 2019-01-17 VITALS — BP 119/78 | HR 80 | Temp 99.4°F | Ht 65.0 in | Wt 168.6 lb

## 2019-01-17 DIAGNOSIS — R1032 Left lower quadrant pain: Secondary | ICD-10-CM

## 2019-01-17 NOTE — Progress Notes (Signed)
Patient ID: CLELA HAGADORN, female   DOB: 03-31-1951, 68 y.o.   MRN: [id number]    [facility] Visit  @DATE @            Patient name: Sabrina Wyatt MRN [id number]  Date of birth: 12-Jul-1951  CC & HPI:  Sabrina Wyatt is a 68 y.o. female presenting today for f/u of pelvic U/S done 01/10/2019. Is still having some pain in abdomen. LUQ pain that is being pulled and wrenched. Has brought in blood work papers from 2 separate doctors. US abdomen has been ordered by Rourk. Had gallbladder surgery and" had her intestines redirected due to complication of surgery" . Pt describes abdomen as bloated . Pt aware this is unavoidable at this time.   COMPARISON:  07/15/2018 CT with contrast  FINDINGS: Uterus: Measurements: 5.1 x 2.8 x 3.2 cm = volume: 24 mL. Normal in size. Anteverted position.  Endometrium: Thickness: 3 mm. Normal thickness. Echogenic shadowing foci along the endometrium noted, suspect calcifications.  Right ovary: Measurements: 1.2 x 1.0 x 1.2 cm = volume: 0.8 mL. Normal appearance/no adnexal mass.  Left ovary:Measurements: 0.9 x 0.7 x 1.0 cm = volume: 0.3 mL. Normal appearance/no adnexal mass.  Other findings:  No abnormal free fluid IMPRESSION: No acute or significant finding by transvaginal pelvic ultrasound.  ROS:  ROS +LLQ pain -fever  All systems are negative except as noted in the HPI and PMH.   Pertinent History Reviewed:   Reviewed: gallbladder surgery Medical         Past Medical History:  Diagnosis Date  . Abdominal wall pain    chronic  . Anemia   . Arthritis    rheumatoid  . Biliary stone   . Chronic abdominal pain   . Chronic flank pain   . Chronic low back pain   . Cirrhosis, hepatitis C    U/S on 03/09/16= no HCC, pt has been vaccinated for Hep A and Hep B. s/p treatment of HCV with eradication  . Depression   . GERD (gastroesophageal reflux disease)   . Gonorrhea   . Hypertension   . Intracranial hemorrhage ([place]) 1998   left  thalmic  . Intussusception ([place]) 04/2012  . Polysubstance abuse ([place])    HX of  . PTSD (post-traumatic stress disorder)   . Pulmonary nodules   . S/P colonoscopy 2005   Dr Moss Mc polyp removed, otherwise normal  . S/P endoscopy 08/27/10   antral erosions, otherwise normal, due egd 08/2012 to screen for varices  . Seizures ([place]) 1998   with brain bleed x1. None since then and on no meds  . Shortness of breath   . Tobacco abuse                               Surgical Hx:    Past Surgical History:  Procedure Laterality Date  . biliary stone removal  2015  . BIOPSY N/A 03/06/2015   Procedure: BIOPSY;  Surgeon: Daneil Dolin, MD;  Location: [facility];  Service: Endoscopy;  Laterality: N/A;  . CARPAL TUNNEL RELEASE Bilateral 12/2010  . CATARACT EXTRACTION W/PHACO Right 01/15/2016   Procedure: CATARACT EXTRACTION PHACO AND INTRAOCULAR LENS PLACEMENT RIGHT EYE CDE=5.88;  Surgeon: Tonny Branch, MD;  Location: [facility];  Service: Ophthalmology;  Laterality: Right;  . CATARACT EXTRACTION W/PHACO Left 02/12/2016   Procedure: CATARACT EXTRACTION PHACO AND INTRAOCULAR LENS PLACEMENT LEFT EYE; CDE:  6.02;  Surgeon: Tonny Branch, MD;  Location: [facility];  Service: Ophthalmology;  Laterality: Left;  . CHOLECYSTECTOMY  2002  . COLONOSCOPY    09/10/2003   diminutive polyp in the rectum cold biopsied/removed/ Normal colon  . COLONOSCOPY WITH PROPOFOL N/A 08/02/2013   DVV:OHYWVPX diverticulosis. next TCS 10 years.  . ESOPHAGOGASTRODUODENOSCOPY    09/10/2003   Small hiatal hernia; otherwise normal stomach, normal D1 and D2  . ESOPHAGOGASTRODUODENOSCOPY  08/27/2010   TGG:YIRSWN esophagus.  No varices.  Couple of tiny antral erosions of doubtful clinical significance, otherwise normal stomach, D1 and D2.  . ESOPHAGOGASTRODUODENOSCOPY (EGD) WITH PROPOFOL N/A 08/02/2013   RMR: Portal gastropathy. No explanation for abdominal pain which I believe is more abdominal wall in origin. She has been seen by Dr. Carlis Abbott  over at  [facility]. She is up-to-date on cross sectional imaging. She has a pain management physician in [place]. Repeat EGD for varices in 3 years.  . ESOPHAGOGASTRODUODENOSCOPY (EGD) WITH PROPOFOL N/A 03/06/2015   IOE:VOJJKKXF gastric mucosac s/p bx  . ESOPHAGOGASTRODUODENOSCOPY (EGD) WITH PROPOFOL N/A 04/18/2017   normal esophagus, portal hypertensive gastropathy, normal duodenum, no specimens collected. Surveillance in Aug 2020  . LUMBAR FUSION    . NOSE SURGERY  1970  . PERCUTANEOUS TRANSHEPATIC CHOLANGIOGRAHPY AND BILLIARY DRAINAGE  2002  . REPAIR VAGINAL CUFF N/A 10/25/2015   Procedure: REPAIR VAGINAL LACERATION ;  Surgeon: Jonnie Kind, MD;  Location: [facility];  Service: Gynecology;  Laterality: N/A;  . Roux-en-y-hepatojejunostomy  2003   revision in 2013 for intussusception [facility] Dr. Bailey Mech)  . SPINAL FUSION     C5-C7   Medications: Reviewed & Updated - see associated section                       Current Outpatient Medications:  .  acyclovir (ZOVIRAX) 400 MG tablet, TAKE 1 TABLET BY MOUTH TWICE DAILY., Disp: 180 tablet, Rfl: 3 .  albuterol (PROVENTIL) (2.5 MG/3ML) 0.083% nebulizer solution, Take 2.5 mg by nebulization 2 (two) times daily as needed for wheezing or shortness of breath. Uses it average 3 times week, Disp: , Rfl:  .  atenolol (TENORMIN) 25 MG tablet, Take 25 mg by mouth every morning. , Disp: , Rfl:  .  Calcium-Magnesium-Vitamin D (CALCIUM 500 PO), Take 1 tablet by mouth daily., Disp: , Rfl:  .  DULoxetine (CYMBALTA) 60 MG capsule, Take 60 mg by mouth daily. , Disp: , Rfl:  .  ferrous sulfate 325 (65 FE) MG tablet, Take 325 mg by mouth daily with breakfast., Disp: , Rfl:  .  fluticasone (FLONASE) 50 MCG/ACT nasal spray, Place 2 sprays into the nose daily.  , Disp: , Rfl:  .  hydrOXYzine (VISTARIL) 50 MG capsule, Take 50 mg by mouth 2 (two) times daily. , Disp: , Rfl:  .  levocetirizine (XYZAL) 5 MG tablet, Take 5 mg by mouth daily as needed for allergies. , Disp: , Rfl:   .  loratadine (CLARITIN) 10 MG tablet, Take 10 mg by mouth daily as needed for allergies., Disp: , Rfl:  .  Omega-3 Fatty Acids (FISH OIL) 1000 MG CAPS, Take 1,000 mg by mouth 2 (two) times daily., Disp: , Rfl:  .  omeprazole (PRILOSEC) 20 MG capsule, TAKE ONE CAPSULE BY MOUTH EVERY DAY, Disp: 30 capsule, Rfl: 11 .  OxyCODONE HCl, Abuse Deter, (OXAYDO) 5 MG TABA, Take 1 tablet by mouth 4 (four) times daily. , Disp: , Rfl:  .  PROAIR HFA 108 (90  BASE) MCG/ACT inhaler, Inhale 2 puffs into the lungs every 6 (six) hours as needed for wheezing or shortness of breath. , Disp: , Rfl:  .  pyridOXINE (VITAMIN B-6) 100 MG tablet, Take 100 mg by mouth daily., Disp: , Rfl:  .  EPIPEN 2-PAK 0.3 MG/0.3ML SOAJ injection, Inject 0.3 mg into the skin as needed (allergic reactions). , Disp: , Rfl:    Social History: Reviewed -  reports that she has been smoking cigarettes. She has a 8.25 pack-year smoking history. She has never used smokeless tobacco.  Objective Findings:  Vitals: Height 5\' 5"  (1.651 m), weight 168 lb 9.6 oz (76.5 kg).  PHYSICAL EXAMINATION General appearance - alert, well appearing, and in no distress Mental status - alert, oriented to person, place, and time, normal mood, behavior, speech, dress, motor activity, and thought processes, affect appropriate to mood   PELVIC NOT EXAMINED- DISCUSSION ONLY   Assessment & Plan:   A:  1. Chronic LLQ pain, not suspected to have a gyn component 2. 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  F/U with Gastroenterology prn    By signing my name below, I, Sabrina Wyatt, attest that this documentation has been prepared under the direction and in the presence of Jonnie Kind, MD. Electronically Signed: [place]. 01/17/19. 11:41 AM.  I personally performed the services described in this documentation, which was SCRIBED in my presence. The recorded information has been reviewed and considered  accurate. It has been edited as necessary during review. Jonnie Kind, MD

## 2019-01-24 LAB — CBC WITH DIFFERENTIAL/PLATELET
Absolute Monocytes: 464 cells/uL (ref 200–950)
Basophils Absolute: 60 cells/uL (ref 0–200)
Basophils Relative: 0.7 %
Eosinophils Absolute: 292 cells/uL (ref 15–500)
Eosinophils Relative: 3.4 %
HCT: 40.3 % (ref 35.0–45.0)
Hemoglobin: 13.7 g/dL (ref 11.7–15.5)
Lymphs Abs: 2795 cells/uL (ref 850–3900)
MCH: 30.6 pg (ref 27.0–33.0)
MCHC: 34 g/dL (ref 32.0–36.0)
MCV: 90 fL (ref 80.0–100.0)
MPV: 11.9 fL (ref 7.5–12.5)
Monocytes Relative: 5.4 %
Neutro Abs: 4988 cells/uL (ref 1500–7800)
Neutrophils Relative %: 58 %
Platelets: 173 10*3/uL (ref 140–400)
RBC: 4.48 10*6/uL (ref 3.80–5.10)
RDW: 13.4 % (ref 11.0–15.0)
Total Lymphocyte: 32.5 %
WBC: 8.6 10*3/uL (ref 3.8–10.8)

## 2019-01-24 LAB — AFP TUMOR MARKER: AFP-Tumor Marker: 4.9 ng/mL

## 2019-01-24 LAB — PROTIME-INR
INR: 1
Prothrombin Time: 10.6 s (ref 9.0–11.5)

## 2019-01-29 ENCOUNTER — Telehealth: Payer: Self-pay | Admitting: Internal Medicine

## 2019-01-29 DIAGNOSIS — R1084 Generalized abdominal pain: Secondary | ICD-10-CM

## 2019-01-29 DIAGNOSIS — R350 Frequency of micturition: Secondary | ICD-10-CM

## 2019-01-29 NOTE — Telephone Encounter (Signed)
Pt was returning a call from DS. I told her DS wasn't available at the moment and I would put a message in for her to call her back.

## 2019-01-29 NOTE — Telephone Encounter (Signed)
Pt is driving and will call back.

## 2019-01-29 NOTE — Telephone Encounter (Signed)
CT scheduled for 02/01/19 at 1:30pm, arrive at 1:15pm. NPO 4 hours prior to test. Pick up contrast prior to test. Called and informed pt of appt and details.   No PA needed for CT per [facility] and [employer].

## 2019-01-29 NOTE — Telephone Encounter (Signed)
Reviewed GYN note. Don't see the mention in there about urinary frequency. Given her multiple issues let's do a couple things:  UA, CT abdomen/pelvis ASAP (query urolithiasis, splenomegaly as etiology, diverticulitis with history, and multiple other etiologies with her PMH)  Will enter orders. Please follow-up with patient.  Cc: AB in my absence this afternoon/tomorrow.

## 2019-01-29 NOTE — Telephone Encounter (Signed)
Pt said she was still having problems and having left sided pain. She thinks she needs additional testing. Please call her at [phone]

## 2019-01-29 NOTE — Telephone Encounter (Signed)
Pt said she is still having pain on her left side in the mid area and it radiates to her back and has a twitching, squeezing sensation. The pain is dull and constant in her back.   Her BM's are normal and she said that she urinates about 20 times a day, no other symptoms.   No nausea or vomiting.   She had a good work up with Dr. Glo Herring to rule out any female problems.  Forwarding to [place], NP to advise!

## 2019-01-29 NOTE — Telephone Encounter (Signed)
Pt called and I had to return call.  LMOM for a return call.

## 2019-01-29 NOTE — Telephone Encounter (Signed)
PT is aware and will go to the lab for the UA. She is aware and OK to schedule the CT. Forwarding to [facility].

## 2019-01-29 NOTE — Telephone Encounter (Signed)
See separate message, I have tried to call pt.

## 2019-01-29 NOTE — Addendum Note (Signed)
Addended by: Gordy Levan, Jimi Schappert A on: 01/29/2019 11:28 AM   Modules accepted: Orders

## 2019-02-01 ENCOUNTER — Ambulatory Visit (HOSPITAL_COMMUNITY): Payer: Medicare Other

## 2019-02-16 ENCOUNTER — Ambulatory Visit (HOSPITAL_COMMUNITY): Admission: RE | Admit: 2019-02-16 | Payer: Medicare Other | Source: Ambulatory Visit

## 2019-02-16 ENCOUNTER — Encounter (HOSPITAL_COMMUNITY): Payer: Self-pay

## 2019-02-16 ENCOUNTER — Ambulatory Visit (HOSPITAL_COMMUNITY)
Admission: RE | Admit: 2019-02-16 | Discharge: 2019-02-16 | Disposition: A | Discharge status: Home / Self Care | Payer: Medicare Other | Source: Ambulatory Visit | Attending: Family Medicine | Admitting: Family Medicine

## 2019-02-16 ENCOUNTER — Ambulatory Visit (HOSPITAL_COMMUNITY)
Admission: RE | Admit: 2019-02-16 | Discharge: 2019-02-16 | Disposition: A | Discharge status: Home / Self Care | Payer: Medicare Other | Source: Ambulatory Visit | Attending: Nurse Practitioner | Admitting: Nurse Practitioner

## 2019-02-16 ENCOUNTER — Other Ambulatory Visit: Payer: Self-pay

## 2019-02-16 ENCOUNTER — Other Ambulatory Visit (HOSPITAL_COMMUNITY): Payer: Self-pay | Admitting: Family Medicine

## 2019-02-16 DIAGNOSIS — M545 Low back pain, unspecified: Secondary | ICD-10-CM

## 2019-02-16 DIAGNOSIS — Z122 Encounter for screening for malignant neoplasm of respiratory organs: Secondary | ICD-10-CM | POA: Insufficient documentation

## 2019-02-16 DIAGNOSIS — R350 Frequency of micturition: Secondary | ICD-10-CM | POA: Diagnosis present

## 2019-02-16 DIAGNOSIS — R1084 Generalized abdominal pain: Secondary | ICD-10-CM | POA: Diagnosis not present

## 2019-02-16 DIAGNOSIS — F17210 Nicotine dependence, cigarettes, uncomplicated: Secondary | ICD-10-CM

## 2019-02-16 LAB — POCT I-STAT CREATININE: Creatinine, Ser: 0.7 mg/dL (ref 0.44–1.00)

## 2019-02-16 MED ORDER — IOHEXOL 300 MG/ML  SOLN
100.0000 mL | Freq: Once | INTRAMUSCULAR | Status: AC | PRN
  Administered 2019-02-16: 100 mL via INTRAVENOUS

## 2019-02-20 ENCOUNTER — Other Ambulatory Visit: Payer: Self-pay | Admitting: *Deleted

## 2019-02-20 DIAGNOSIS — Z87891 Personal history of nicotine dependence: Secondary | ICD-10-CM

## 2019-02-20 DIAGNOSIS — Z122 Encounter for screening for malignant neoplasm of respiratory organs: Secondary | ICD-10-CM

## 2019-02-20 DIAGNOSIS — F17210 Nicotine dependence, cigarettes, uncomplicated: Secondary | ICD-10-CM

## 2019-07-05 ENCOUNTER — Other Ambulatory Visit: Payer: Self-pay

## 2019-07-05 ENCOUNTER — Ambulatory Visit (INDEPENDENT_AMBULATORY_CARE_PROVIDER_SITE_OTHER): Payer: Medicare Other | Admitting: Nurse Practitioner

## 2019-07-05 ENCOUNTER — Encounter: Payer: Self-pay | Admitting: Nurse Practitioner

## 2019-07-05 VITALS — BP 148/89 | HR 80 | Temp 97.6°F | Ht 66.0 in | Wt 165.6 lb

## 2019-07-05 DIAGNOSIS — K7460 Unspecified cirrhosis of liver: Secondary | ICD-10-CM | POA: Diagnosis not present

## 2019-07-05 DIAGNOSIS — K766 Portal hypertension: Secondary | ICD-10-CM | POA: Diagnosis not present

## 2019-07-05 NOTE — Assessment & Plan Note (Signed)
Cirrhosis likely multifactorial from hepatitis C and ongoing alcohol intake.  She has had 2 emergency room visits in the past year for ethanol intoxication.  Today she states her last alcohol intake was Monday and she had 2 glasses of wine her daughter correctly.  She states "occasional" alcohol intake.  I feel she is likely drinking more than she lets on.  Recommended alcohol cessation.  She is currently due for updated labs and imaging which we will arrange.  She is also due for EGD for variceal screening as per above.  Follow-up in 6 months.

## 2019-07-05 NOTE — Assessment & Plan Note (Signed)
Likely mild portal hypertension as evidenced by ability with portal hypertensive gastropathy.  No esophageal varices on last EGD.  Platelet count still remains low normal, most recently 173.  At this point she is due for variceal screening and we will set this up for her.  Follow-up in 6 months for routine liver care.  Proceed with EGD on propofol/MAC with Dr. Gala Romney in near future: the risks, benefits, and alternatives have been discussed with the patient in detail. The patient states understanding and desires to proceed.  The patient is currently on Cymbalta, oxycodone.  No other anticoagulants, anxiolytics, chronic pain medications, antidepressants, antidiabetics, or iron supplements.  She does still intermittently drink alcohol.  Denies drug use.  Given meds and alcohol intake we will plan for the procedure on propofol/MAC.

## 2019-07-05 NOTE — Patient Instructions (Signed)
Your health issues we discussed today were:   Cirrhosis: 1. Continue to reduce alcohol and eventually stop drinking all alcohol.  Your liver disease will only likely get worse if you continue to drink alcohol 2. Have your labs done when you are able to 3. We will help schedule your ultrasound for you 4. We will schedule your upper endoscopy for you as well. 5. Call us if you have any worsening or severe symptoms such as vomiting blood, black stools, yellowing of your skin or eyes (jaundice), or sudden confusion.  Overall I recommend:  1. Continue your other current medications 2. Return for follow-up in 6 months 3. Call us if you have any questions or concerns.   Because of recent events of COVID-19 ("Coronavirus"), follow CDC recommendations:  1. Wash your hand frequently 2. Avoid touching your face 3. Stay away from people who are sick 4. If you have symptoms such as fever, cough, shortness of breath then call your healthcare provider for further guidance 5. If you are sick, STAY AT HOME unless otherwise directed by your healthcare provider. 6. Follow directions from state and national officials regarding staying safe   At [facility] we value your feedback. You may receive a survey about your visit today. Please share your experience as we strive to create trusting relationships with our patients to provide genuine, compassionate, quality care.  We appreciate your understanding and patience as we review any laboratory studies, imaging, and other diagnostic tests that are ordered as we care for you. Our office policy is 5 business days for review of these results, and any emergent or urgent results are addressed in a timely manner for your best interest. If you do not hear from our office in 1 week, please contact us.   We also encourage the use of MyChart, which contains your medical information for your review as well. If you are not enrolled in this feature, an  access code is on this after visit summary for your convenience. Thank you for allowing [place] to be involved in your care.  It was great to see you today!  I hope you have a Happy Thanksgiving!!

## 2019-07-05 NOTE — Progress Notes (Signed)
Referring Provider: Lucia Gaskins, MD Primary Care Physician:  Lucia Gaskins, MD Primary GI:  Dr. Gala Romney  Chief Complaint  Patient presents with  . Cirrhosis    due for 2 yr egd    HPI:   Sabrina Wyatt is a 68 y.o. female who presents for follow-up on cirrhosis.  The patient was last seen in our office 01/01/2019 for cirrhosis and GERD.  The patient's last visit was a virtual office visit due to COVID-19/coronavirus pandemic.  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  She presented to the ER on February 26, 2019 with alcohol intoxication despite stating she only drinks alcohol "once in a blue moon."  Emergency room visits twice since the end of November 2019, on 07/15/2018 for a fall with ethanol level of 272 and again on 11/10/2018 for alcohol intoxication.  At her last visit she denied overt GI and hepatic symptoms.  Essentially no complaints and feels she is doing well.  Stressed the need to abstain from alcohol.  Recommended updating labs and imaging.  We will need to discuss upper endoscopy at follow-up.  Follow-up in 6 months.  CBC, INR, AFP, CMP were completed 01/23/2019.  CBC was essentially normal with a platelet count of 173.  INR normal at 1.0, AFP normal at 4.9.  CMP was also 21/14, alkaline phosphatase 82, bili 0.4.  MELD 6 ; Child-Pugh class A.  CT of the abdomen and pelvis completed 02/16/2019 found morphologic changes in the liver consistent with cirrhosis but no ascites or suspicious lesions.  No acute findings otherwise.  Most recent EGD completed 04/18/2017 which found normal esophagus, portal hypertensive gastropathy, normal duodenum.  Recommended repeat variceal screening in 2 years (2020).  Today she states she's doing well overall. Denies abdominal pain, N/V, hematochezia, melena, fever, chills, unintentional weight loss. Denies yellowing of skin/eyes, darkened urine, acute episodic confusion, generalized pruritis, tremors/shakes.  Denies URI or flu-like symptoms. Denies loss of sense of taste or smell. Denies chest pain, dyspnea, dizziness, lightheadedness, syncope, near syncope. Denies any other upper or lower GI symptoms.  Last ETOH was 3 days ago, 2 glasses of wine at daughter's birthday.  Past Medical History:  Diagnosis Date  . Abdominal wall pain    chronic  . Anemia   . Arthritis    rheumatoid  . Biliary stone   . Chronic abdominal pain   . Chronic flank pain   . Chronic low back pain   . Cirrhosis, hepatitis C    U/S on 03/09/16= no HCC, pt has been vaccinated for Hep A and Hep B. s/p treatment of HCV with eradication  . Depression   . GERD (gastroesophageal reflux disease)   . Gonorrhea   . Hypertension   . 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 Intussusception ([place]) 04/2012  . Polysubstance abuse ([place])    HX of  . PTSD (post-traumatic stress disorder)   . Pulmonary nodules   . S/P colonoscopy 2005   Dr Moss Mc polyp removed, otherwise normal  . S/P endoscopy 08/27/10   antral erosions, otherwise normal, due egd 08/2012 to screen for varices  . Seizures ([place]) 1998   with brain bleed x1. None since then and on no meds  . Shortness of breath   . Tobacco abuse     Past Surgical History:  Procedure Laterality Date  . biliary stone removal  2015  . BIOPSY N/A 03/06/2015   Procedure: BIOPSY;  Surgeon: Cristopher Estimable  Rourk, MD;  Location: [facility];  Service: Endoscopy;  Laterality: N/A;  . CARPAL TUNNEL RELEASE Bilateral 12/2010  . CATARACT EXTRACTION W/PHACO Right 01/15/2016   Procedure: CATARACT EXTRACTION PHACO AND INTRAOCULAR LENS PLACEMENT RIGHT EYE CDE=5.88;  Surgeon: Tonny Branch, MD;  Location: [facility];  Service: Ophthalmology;  Laterality: Right;  . CATARACT EXTRACTION W/PHACO Left 02/12/2016   Procedure: CATARACT EXTRACTION PHACO AND INTRAOCULAR LENS PLACEMENT LEFT EYE; CDE:  6.02;  Surgeon: Tonny Branch, MD;  Location: [facility];  Service: Ophthalmology;  Laterality: Left;  . CHOLECYSTECTOMY   2002  . COLONOSCOPY    09/10/2003   diminutive polyp in the rectum cold biopsied/removed/ Normal colon  . COLONOSCOPY WITH PROPOFOL N/A 08/02/2013   [id number] diverticulosis. next TCS 10 years.  . ESOPHAGOGASTRODUODENOSCOPY    09/10/2003   Small hiatal hernia; otherwise normal stomach, normal D1 and D2  . ESOPHAGOGASTRODUODENOSCOPY  08/27/2010   [id number] esophagus.  No varices.  Couple of tiny antral erosions of doubtful clinical significance, otherwise normal stomach, D1 and D2.  . ESOPHAGOGASTRODUODENOSCOPY (EGD) WITH PROPOFOL N/A 08/02/2013   RMR: Portal gastropathy. No explanation for abdominal pain which I believe is more abdominal wall in origin. She has been seen by Dr. Carlis Abbott  over at [facility]. She is up-to-date on cross sectional imaging. She has a pain management physician in [place]. Repeat EGD for varices in 3 years.  . ESOPHAGOGASTRODUODENOSCOPY (EGD) WITH PROPOFOL N/A 03/06/2015   [id number] gastric mucosac s/p bx  . ESOPHAGOGASTRODUODENOSCOPY (EGD) WITH PROPOFOL N/A 04/18/2017   normal esophagus, portal hypertensive gastropathy, normal duodenum, no specimens collected. Surveillance in Aug 2020  . LUMBAR FUSION    . NOSE SURGERY  1970  . PERCUTANEOUS TRANSHEPATIC CHOLANGIOGRAHPY AND BILLIARY DRAINAGE  2002  . REPAIR VAGINAL CUFF N/A 10/25/2015   Procedure: REPAIR VAGINAL LACERATION ;  Surgeon: Jonnie Kind, MD;  Location: [facility];  Service: Gynecology;  Laterality: N/A;  . Roux-en-y-hepatojejunostomy  2003   revision in 2013 for intussusception [facility] Dr. Bailey Mech)  . SPINAL FUSION     C5-C7    Current Outpatient Medications  Medication Sig Dispense Refill  . acyclovir (ZOVIRAX) 400 MG tablet TAKE 1 TABLET BY MOUTH TWICE DAILY. 180 tablet 3  . albuterol (PROVENTIL) (2.5 MG/3ML) 0.083% nebulizer solution Take 2.5 mg by nebulization 2 (two) times daily as needed for wheezing or shortness of breath. Uses it average 3 times week    . atenolol (TENORMIN) 25 MG tablet  Take 25 mg by mouth every morning.     . Calcium-Magnesium-Vitamin D (CALCIUM 500 PO) Take 1 tablet by mouth daily.    . DULoxetine (CYMBALTA) 60 MG capsule Take 60 mg by mouth daily.     Marland Kitchen EPIPEN 2-PAK 0.3 MG/0.3ML SOAJ injection Inject 0.3 mg into the skin as needed (allergic reactions).     . ferrous sulfate 325 (65 FE) MG tablet Take 325 mg by mouth daily with breakfast.    . fluticasone (FLONASE) 50 MCG/ACT nasal spray Place 2 sprays into the nose daily.      . hydrOXYzine (VISTARIL) 50 MG capsule Take 50 mg by mouth 2 (two) times daily.     Marland Kitchen levocetirizine (XYZAL) 5 MG tablet Take 5 mg by mouth daily as needed for allergies.     Marland Kitchen loratadine (CLARITIN) 10 MG tablet Take 10 mg by mouth daily as needed for allergies.    . Omega-3 Fatty Acids (FISH OIL) 1000 MG CAPS Take 1,000 mg by mouth 2 (two) times daily.    Marland Kitchen  omeprazole (PRILOSEC) 20 MG capsule TAKE ONE CAPSULE BY MOUTH EVERY DAY 30 capsule 11  . OxyCODONE HCl, Abuse Deter, (OXAYDO) 5 MG TABA Take 1 tablet by mouth 4 (four) times daily.     Marland Kitchen PROAIR HFA 108 (90 BASE) MCG/ACT inhaler Inhale 2 puffs into the lungs every 6 (six) hours as needed for wheezing or shortness of breath.     . pyridOXINE (VITAMIN B-6) 100 MG tablet Take 100 mg by mouth daily.     No current facility-administered medications for this visit.     Allergies as of 07/05/2019 - Review Complete 07/05/2019  Allergen Reaction Noted  . Asa [aspirin] Nausea Only 04/13/2016  . Bee venom Hives 07/10/2015  . Codeine Nausea Only 02/18/2017  . Tylenol [acetaminophen] Nausea And Vomiting and Swelling 07/10/2015    Family History  Problem Relation Age of Onset  . Coronary artery disease Brother   . Cancer Sister        lymphoma  . Cancer Father        brain    Social History   Socioeconomic History  . Marital status: Single    Spouse name: Not on file  . Number of children: 1  . Years of education: Not on file  . Highest education level: Not on file   Occupational History  . Occupation: disabled    Fish farm manager: UNEMPLOYED  Social Needs  . Financial resource strain: Not on file  . Food insecurity    Worry: Not on file    Inability: Not on file  . Transportation needs    Medical: Not on file    Non-medical: Not on file  Tobacco Use  . Smoking status: Current Every Day Smoker    Packs/day: 0.25    Years: 33.00    Pack years: 8.25    Types: Cigarettes  . Smokeless tobacco: Never Used  Substance and Sexual Activity  . Alcohol use: Yes    Alcohol/week: 0.0 standard drinks    Comment: occasionally   . Drug use: No  . Sexual activity: Yes    Partners: Male    Birth control/protection: Post-menopausal, Condom  Lifestyle  . Physical activity    Days per week: Not on file    Minutes per session: Not on file  . Stress: Not on file  Relationships  . Social Herbalist on phone: Not on file    Gets together: Not on file    Attends religious service: Not on file    Active member of club or organization: Not on file    Attends meetings of clubs or organizations: Not on file    Relationship status: Not on file  Other Topics Concern  . Not on file  Social History Narrative  . Not on file    Review of Systems: General: Negative for anorexia, weight loss, fever, chills, fatigue, weakness. ENT: Negative for hoarseness, difficulty swallowing. CV: Negative for chest pain, angina, palpitations, peripheral edema.  Respiratory: Negative for dyspnea at rest, cough, sputum, wheezing.  GI: See history of present illness. Derm: Negative for rash or itching.  Neuro: Negative for memory loss, confusion.  Endo: Negative for unusual weight change.  Heme: Negative for bruising or bleeding. Allergy: Negative for rash or hives.   Physical Exam: BP (!) 148/89   Pulse 80   Temp 97.6 F (36.4 C) (Oral)   Ht 5\' 6"  (1.676 m)   Wt 165 lb 9.6 oz (75.1 kg)   BMI 26.73 kg/m  General:   Alert and oriented. Pleasant and cooperative.  Well-nourished and well-developed.  Eyes:  Without icterus, sclera clear and conjunctiva pink.  Ears:  Normal auditory acuity. Cardiovascular:  S1, S2 present without murmurs appreciated. Extremities without clubbing or edema. Respiratory:  Clear to auscultation bilaterally. No wheezes, rales, or rhonchi. No distress.  Gastrointestinal:  +BS, soft, non-tender and non-distended. No HSM noted. No guarding or rebound. No masses appreciated.  Rectal:  Deferred  Musculoskalatal:  Symmetrical without gross deformities. Neurologic:  Alert and oriented x4;  grossly normal neurologically. Psych:  Alert and cooperative. Normal mood and affect. Heme/Lymph/Immune: No excessive bruising noted.    07/05/2019 9:57 AM   Disclaimer: This note was dictated with voice recognition software. Similar sounding words can inadvertently be transcribed and may not be corrected upon review.

## 2019-07-05 NOTE — Patient Instructions (Signed)
No PA needed for [place] RUQ per [employer] website: NOTE: This [facility] member does not require prior authorization for OUTPATIENT Radiology through [place] or [place] [facility] at this time.

## 2019-07-06 LAB — CBC WITH DIFFERENTIAL/PLATELET
Absolute Monocytes: 574 cells/uL (ref 200–950)
Basophils Absolute: 49 cells/uL (ref 0–200)
Basophils Relative: 0.6 %
Eosinophils Absolute: 328 cells/uL (ref 15–500)
Eosinophils Relative: 4 %
HCT: 41.5 % (ref 35.0–45.0)
Hemoglobin: 13.7 g/dL (ref 11.7–15.5)
Lymphs Abs: 3083 cells/uL (ref 850–3900)
MCH: 29.9 pg (ref 27.0–33.0)
MCHC: 33 g/dL (ref 32.0–36.0)
MCV: 90.6 fL (ref 80.0–100.0)
MPV: 12.6 fL — ABNORMAL HIGH (ref 7.5–12.5)
Monocytes Relative: 7 %
Neutro Abs: 4166 cells/uL (ref 1500–7800)
Neutrophils Relative %: 50.8 %
Platelets: 141 10*3/uL (ref 140–400)
RBC: 4.58 10*6/uL (ref 3.80–5.10)
RDW: 13.7 % (ref 11.0–15.0)
Total Lymphocyte: 37.6 %
WBC: 8.2 10*3/uL (ref 3.8–10.8)

## 2019-07-06 LAB — COMPREHENSIVE METABOLIC PANEL
AG Ratio: 1.5 (calc) (ref 1.0–2.5)
ALT: 17 U/L (ref 6–29)
AST: 20 U/L (ref 10–35)
Albumin: 4 g/dL (ref 3.6–5.1)
Alkaline phosphatase (APISO): 90 U/L (ref 37–153)
BUN: 8 mg/dL (ref 7–25)
CO2: 26 mmol/L (ref 20–32)
Calcium: 9.3 mg/dL (ref 8.6–10.4)
Chloride: 104 mmol/L (ref 98–110)
Creat: 0.9 mg/dL (ref 0.50–0.99)
Globulin: 2.7 g/dL (calc) (ref 1.9–3.7)
Glucose, Bld: 92 mg/dL (ref 65–139)
Potassium: 4.6 mmol/L (ref 3.5–5.3)
Sodium: 141 mmol/L (ref 135–146)
Total Bilirubin: 0.4 mg/dL (ref 0.2–1.2)
Total Protein: 6.7 g/dL (ref 6.1–8.1)

## 2019-07-06 LAB — AFP TUMOR MARKER: AFP-Tumor Marker: 4.2 ng/mL

## 2019-07-06 LAB — PROTIME-INR
INR: 1
Prothrombin Time: 10.8 s (ref 9.0–11.5)

## 2019-07-09 ENCOUNTER — Ambulatory Visit (HOSPITAL_COMMUNITY): Payer: Medicare Other

## 2019-07-16 ENCOUNTER — Ambulatory Visit (HOSPITAL_COMMUNITY)
Admission: RE | Admit: 2019-07-16 | Discharge: 2019-07-16 | Disposition: A | Discharge status: Home / Self Care | Payer: Medicare Other | Source: Ambulatory Visit | Attending: Nurse Practitioner | Admitting: Nurse Practitioner

## 2019-07-16 ENCOUNTER — Other Ambulatory Visit: Payer: Self-pay

## 2019-07-16 DIAGNOSIS — K766 Portal hypertension: Secondary | ICD-10-CM | POA: Diagnosis present

## 2019-07-16 DIAGNOSIS — K7460 Unspecified cirrhosis of liver: Secondary | ICD-10-CM | POA: Diagnosis not present

## 2019-09-10 ENCOUNTER — Telehealth: Payer: Self-pay | Admitting: *Deleted

## 2019-09-10 NOTE — Telephone Encounter (Signed)
PA for EGD has been approved via [employer]. Auth# [id number] dates 09/27/2019-12/26/2019

## 2019-09-10 NOTE — Telephone Encounter (Signed)
Called pt. We have changed her procedure time on 2/4 to 9:30am. Patient aware she can have clear liquids until midnight and nothing after midnight. She voiced understanding. Patient aware covid/pre-op appt will stay the same. Called endo and aware of change.

## 2019-09-25 ENCOUNTER — Other Ambulatory Visit: Payer: Self-pay

## 2019-09-25 ENCOUNTER — Encounter (HOSPITAL_COMMUNITY)
Admission: RE | Admit: 2019-09-25 | Discharge: 2019-09-25 | Disposition: A | Discharge status: Home / Self Care | Payer: Medicare Other | Source: Ambulatory Visit | Attending: Internal Medicine | Admitting: Internal Medicine

## 2019-09-25 ENCOUNTER — Other Ambulatory Visit (HOSPITAL_COMMUNITY)
Admission: RE | Admit: 2019-09-25 | Discharge: 2019-09-25 | Disposition: A | Discharge status: Home / Self Care | Payer: Medicare Other | Source: Ambulatory Visit | Attending: Internal Medicine | Admitting: Internal Medicine

## 2019-09-25 ENCOUNTER — Telehealth: Payer: Self-pay | Admitting: Internal Medicine

## 2019-09-25 NOTE — Telephone Encounter (Signed)
PLEASE CALL PATIENT, SHE WANTS TO RESCHEDULE HER PROCEDURE

## 2019-09-25 NOTE — Telephone Encounter (Signed)
Called pt, EGD w/Prop w/RMR rescheduled to 11/22/19 at 1:00pm (PA valid till 12/26/19).   Endo scheduler informed. Pre-op and COVID test 11/20/19. Letter mailed with procedure instructions.

## 2019-09-25 NOTE — Patient Instructions (Signed)
[place] TRUNDLE  09/25/2019     @PREFPERIOPPHARMACY @   Your procedure is scheduled on  09/27/2019  Report to [facility] at  [email or web address].M.  Call this number if you have problems the morning of surgery:  [phone]   Remember:  Follow the diet instructions given to you by Dr Roseanne Kaufman office.                      Take these medicines the morning of surgery with A SIP OF WATER  Zovirax, atenolol, rexulti, cymbalta, lodine(if needed), prilosec, oxycodone ( if needed), zanaflex(if needed). Use your nebulizer before you come.    Do not wear jewelry, make-up or nail polish.  Do not wear lotions, powders, or perfumes. Please wear deodorant and brush your teeth.  Do not shave 48 hours prior to surgery.  Men may shave face and neck.  Do not bring valuables to the hospital.  [facility] is not responsible for any belongings or valuables.  Contacts, dentures or bridgework may not be worn into surgery.  Leave your suitcase in the car.  After surgery it may be brought to your room.  For patients admitted to the hospital, discharge time will be determined by your treatment team.  Patients discharged the day of surgery will not be allowed to drive home.   Name and phone number of your driver:   family Special instructions:  None  Please read over the following fact sheets that you were given. Anesthesia Post-op Instructions and Care and Recovery After Surgery       Upper Endoscopy, Adult, Care After This sheet gives you information about how to care for yourself after your procedure. Your health care provider may also give you more specific instructions. If you have problems or questions, contact your health care provider. What can I expect after the procedure? After the procedure, it is common to have:  A sore throat.  Mild stomach pain or discomfort.  Bloating.  Nausea. Follow these instructions at home:   Follow instructions from your health care provider about what to  eat or drink after your procedure.  Return to your normal activities as told by your health care provider. Ask your health care provider what activities are safe for you.  Take over-the-counter and prescription medicines only as told by your health care provider.  Do not drive for 24 hours if you were given a sedative during your procedure.  Keep all follow-up visits as told by your health care provider. This is important. Contact a health care provider if you have:  A sore throat that lasts longer than one day.  Trouble swallowing. Get help right away if:  You vomit blood or your vomit looks like coffee grounds.  You have: ? A fever. ? Bloody, black, or tarry stools. ? A severe sore throat or you cannot swallow. ? Difficulty breathing. ? Severe pain in your chest or abdomen. Summary  After the procedure, it is common to have a sore throat, mild stomach discomfort, bloating, and nausea.  Do not drive for 24 hours if you were given a sedative during the procedure.  Follow instructions from your health care provider about what to eat or drink after your procedure.  Return to your normal activities as told by your health care provider. This information is not intended to replace advice given to you by your health care provider. Make sure you discuss any questions you have  with your health care provider. Document Revised: 01/31/2018 Document Reviewed: 01/09/2018 Elsevier Patient Education  2020 [place] After These instructions provide you with information about caring for yourself after your procedure. Your health care provider may also give you more specific instructions. Your treatment has been planned according to current medical practices, but problems sometimes occur. Call your health care provider if you have any problems or questions after your procedure. What can I expect after the procedure? After your procedure, you may:  Feel  sleepy for several hours.  Feel clumsy and have poor balance for several hours.  Feel forgetful about what happened after the procedure.  Have poor judgment for several hours.  Feel nauseous or vomit.  Have a sore throat if you had a breathing tube during the procedure. Follow these instructions at home: For at least 24 hours after the procedure:      Have a responsible adult stay with you. It is important to have someone help care for you until you are awake and alert.  Rest as needed.  Do not: ? Participate in activities in which you could fall or become injured. ? Drive. ? Use heavy machinery. ? Drink alcohol. ? Take sleeping pills or medicines that cause drowsiness. ? Make important decisions or sign legal documents. ? Take care of children on your own. Eating and drinking  Follow the diet that is recommended by your health care provider.  If you vomit, drink water, juice, or soup when you can drink without vomiting.  Make sure you have little or no nausea before eating solid foods. General instructions  Take over-the-counter and prescription medicines only as told by your health care provider.  If you have sleep apnea, surgery and certain medicines can increase your risk for breathing problems. Follow instructions from your health care provider about wearing your sleep device: ? Anytime you are sleeping, including during daytime naps. ? While taking prescription pain medicines, sleeping medicines, or medicines that make you drowsy.  If you smoke, do not smoke without supervision.  Keep all follow-up visits as told by your health care provider. This is important. Contact a health care provider if:  You keep feeling nauseous or you keep vomiting.  You feel light-headed.  You develop a rash.  You have a fever. Get help right away if:  You have trouble breathing. Summary  For several hours after your procedure, you may feel sleepy and have poor  judgment.  Have a responsible adult stay with you for at least 24 hours or until you are awake and alert. This information is not intended to replace advice given to you by your health care provider. Make sure you discuss any questions you have with your health care provider. Document Revised: 11/07/2017 Document Reviewed: 11/30/2015 Elsevier Patient Education  [place].

## 2019-10-03 ENCOUNTER — Other Ambulatory Visit: Payer: Self-pay | Admitting: Obstetrics and Gynecology

## 2019-10-03 NOTE — Telephone Encounter (Signed)
Acyclovir bid supression x 1 yr e=scribed.

## 2019-11-19 NOTE — Patient Instructions (Signed)
JASIRI SANDIN  11/19/2019     @PREFPERIOPPHARMACY @   Your procedure is scheduled on  11/22/2019 .  Report to [facility] at  1130  A.M.  Call this number if you have problems the morning of surgery:  [phone]   Remember:  Follow the diet instructions given to you by Dr Roseanne Kaufman office.                       Take these medicines the morning of surgery with A SIP OF WATER  Atenolol, rexulti, cymbalta, lodine or oxycodone(if needed), prilosec.    Do not wear jewelry, make-up or nail polish.  Do not wear lotions, powders, or perfumes. Please wear deodorant and brush your teeth.  Do not shave 48 hours prior to surgery.  Men may shave face and neck.  Do not bring valuables to the hospital.  [facility] is not responsible for any belongings or valuables.  Contacts, dentures or bridgework may not be worn into surgery.  Leave your suitcase in the car.  After surgery it may be brought to your room.  For patients admitted to the hospital, discharge time will be determined by your treatment team.  Patients discharged the day of surgery will not be allowed to drive home.   Name and phone number of your driver:   family Special instructions: DO NOT smoke the morning of your procedure.  Please read over the following fact sheets that you were given. Anesthesia Post-op Instructions and Care and Recovery After Surgery       Upper Endoscopy, Adult, Care After This sheet gives you information about how to care for yourself after your procedure. Your health care provider may also give you more specific instructions. If you have problems or questions, contact your health care provider. What can I expect after the procedure? After the procedure, it is common to have:  A sore throat.  Mild stomach pain or discomfort.  Bloating.  Nausea. Follow these instructions at home:   Follow instructions from your health care provider about what to eat or drink after your  procedure.  Return to your normal activities as told by your health care provider. Ask your health care provider what activities are safe for you.  Take over-the-counter and prescription medicines only as told by your health care provider.  Do not drive for 24 hours if you were given a sedative during your procedure.  Keep all follow-up visits as told by your health care provider. This is important. Contact a health care provider if you have:  A sore throat that lasts longer than one day.  Trouble swallowing. Get help right away if:  You vomit blood or your vomit looks like coffee grounds.  You have: ? A fever. ? Bloody, black, or tarry stools. ? A severe sore throat or you cannot swallow. ? Difficulty breathing. ? Severe pain in your chest or abdomen. Summary  After the procedure, it is common to have a sore throat, mild stomach discomfort, bloating, and nausea.  Do not drive for 24 hours if you were given a sedative during the procedure.  Follow instructions from your health care provider about what to eat or drink after your procedure.  Return to your normal activities as told by your health care provider. This information is not intended to replace advice given to you by your health care provider. Make sure you discuss any questions you have with your health  care provider. Document Revised: 01/31/2018 Document Reviewed: 01/09/2018 Elsevier Patient Education  2020 [place] After These instructions provide you with information about caring for yourself after your procedure. Your health care provider may also give you more specific instructions. Your treatment has been planned according to current medical practices, but problems sometimes occur. Call your health care provider if you have any problems or questions after your procedure. What can I expect after the procedure? After your procedure, you may:  Feel sleepy for several  hours.  Feel clumsy and have poor balance for several hours.  Feel forgetful about what happened after the procedure.  Have poor judgment for several hours.  Feel nauseous or vomit.  Have a sore throat if you had a breathing tube during the procedure. Follow these instructions at home: For at least 24 hours after the procedure:      Have a responsible adult stay with you. It is important to have someone help care for you until you are awake and alert.  Rest as needed.  Do not: ? Participate in activities in which you could fall or become injured. ? Drive. ? Use heavy machinery. ? Drink alcohol. ? Take sleeping pills or medicines that cause drowsiness. ? Make important decisions or sign legal documents. ? Take care of children on your own. Eating and drinking  Follow the diet that is recommended by your health care provider.  If you vomit, drink water, juice, or soup when you can drink without vomiting.  Make sure you have little or no nausea before eating solid foods. General instructions  Take over-the-counter and prescription medicines only as told by your health care provider.  If you have sleep apnea, surgery and certain medicines can increase your risk for breathing problems. Follow instructions from your health care provider about wearing your sleep device: ? Anytime you are sleeping, including during daytime naps. ? While taking prescription pain medicines, sleeping medicines, or medicines that make you drowsy.  If you smoke, do not smoke without supervision.  Keep all follow-up visits as told by your health care provider. This is important. Contact a health care provider if:  You keep feeling nauseous or you keep vomiting.  You feel light-headed.  You develop a rash.  You have a fever. Get help right away if:  You have trouble breathing. Summary  For several hours after your procedure, you may feel sleepy and have poor judgment.  Have a  responsible adult stay with you for at least 24 hours or until you are awake and alert. This information is not intended to replace advice given to you by your health care provider. Make sure you discuss any questions you have with your health care provider. Document Revised: 11/07/2017 Document Reviewed: 11/30/2015 Elsevier Patient Education  [place].

## 2019-11-20 ENCOUNTER — Encounter (HOSPITAL_COMMUNITY)
Admission: RE | Admit: 2019-11-20 | Discharge: 2019-11-20 | Disposition: A | Discharge status: Home / Self Care | Payer: Medicare Other | Source: Ambulatory Visit | Attending: Internal Medicine | Admitting: Internal Medicine

## 2019-11-20 ENCOUNTER — Encounter (HOSPITAL_COMMUNITY): Payer: Self-pay

## 2019-11-20 ENCOUNTER — Other Ambulatory Visit: Payer: Self-pay

## 2019-11-20 ENCOUNTER — Other Ambulatory Visit (HOSPITAL_COMMUNITY)
Admission: RE | Admit: 2019-11-20 | Discharge: 2019-11-20 | Disposition: A | Discharge status: Home / Self Care | Payer: Medicare Other | Source: Ambulatory Visit | Attending: Internal Medicine | Admitting: Internal Medicine

## 2019-11-20 DIAGNOSIS — Z01812 Encounter for preprocedural laboratory examination: Secondary | ICD-10-CM | POA: Diagnosis present

## 2019-11-20 DIAGNOSIS — Z20822 Contact with and (suspected) exposure to covid-19: Secondary | ICD-10-CM | POA: Insufficient documentation

## 2019-11-20 DIAGNOSIS — Z01810 Encounter for preprocedural cardiovascular examination: Secondary | ICD-10-CM | POA: Diagnosis present

## 2019-11-20 LAB — COMPREHENSIVE METABOLIC PANEL
ALT: 24 U/L (ref 0–44)
AST: 25 U/L (ref 15–41)
Albumin: 3.5 g/dL (ref 3.5–5.0)
Alkaline Phosphatase: 95 U/L (ref 38–126)
Anion gap: 8 (ref 5–15)
BUN: 8 mg/dL (ref 8–23)
CO2: 27 mmol/L (ref 22–32)
Calcium: 8.5 mg/dL — ABNORMAL LOW (ref 8.9–10.3)
Chloride: 103 mmol/L (ref 98–111)
Creatinine, Ser: 0.77 mg/dL (ref 0.44–1.00)
GFR calc Af Amer: >60 mL/min (ref >60)
GFR calc non Af Amer: >60 mL/min (ref >60)
Glucose, Bld: 99 mg/dL (ref 70–99)
Potassium: 4 mmol/L (ref 3.5–5.1)
Sodium: 138 mmol/L (ref 135–145)
Total Bilirubin: 0.7 mg/dL (ref 0.3–1.2)
Total Protein: 6.5 g/dL (ref 6.5–8.1)

## 2019-11-20 LAB — CBC WITH DIFFERENTIAL/PLATELET
Abs Immature Granulocytes: 0.03 10*3/uL (ref 0.00–0.07)
Basophils Absolute: 0 10*3/uL (ref 0.0–0.1)
Basophils Relative: 1 %
Eosinophils Absolute: 0.3 10*3/uL (ref 0.0–0.5)
Eosinophils Relative: 4 %
HCT: 38.7 % (ref 36.0–46.0)
Hemoglobin: 12.1 g/dL (ref 12.0–15.0)
Immature Granulocytes: 1 %
Lymphocytes Relative: 20 %
Lymphs Abs: 1.3 10*3/uL (ref 0.7–4.0)
MCH: 30 pg (ref 26.0–34.0)
MCHC: 31.3 g/dL (ref 30.0–36.0)
MCV: 96 fL (ref 80.0–100.0)
Monocytes Absolute: 0.5 10*3/uL (ref 0.1–1.0)
Monocytes Relative: 7 %
Neutro Abs: 4.4 10*3/uL (ref 1.7–7.7)
Neutrophils Relative %: 67 %
Platelets: 163 10*3/uL (ref 150–400)
RBC: 4.03 MIL/uL (ref 3.87–5.11)
RDW: 14.7 % (ref 11.5–15.5)
WBC: 6.4 10*3/uL (ref 4.0–10.5)
nRBC: 0 % (ref 0.0–0.2)

## 2019-11-20 LAB — PROTIME-INR
INR: 1 (ref 0.8–1.2)
Prothrombin Time: 13 seconds (ref 11.4–15.2)

## 2019-11-20 LAB — SARS CORONAVIRUS 2 (TAT 6-24 HRS): SARS Coronavirus 2: NEGATIVE

## 2019-11-22 ENCOUNTER — Encounter (HOSPITAL_COMMUNITY)
Admission: RE | Disposition: A | Discharge status: Home / Self Care | Payer: Self-pay | Source: Home / Self Care | Attending: Internal Medicine

## 2019-11-22 ENCOUNTER — Other Ambulatory Visit: Payer: Self-pay

## 2019-11-22 ENCOUNTER — Ambulatory Visit (HOSPITAL_COMMUNITY): Payer: Medicare Other | Admitting: Anesthesiology

## 2019-11-22 ENCOUNTER — Encounter (HOSPITAL_COMMUNITY): Payer: Self-pay | Admitting: Internal Medicine

## 2019-11-22 ENCOUNTER — Ambulatory Visit: Payer: Medicare Other

## 2019-11-22 ENCOUNTER — Ambulatory Visit: Payer: Self-pay

## 2019-11-22 ENCOUNTER — Ambulatory Visit (HOSPITAL_COMMUNITY)
Admission: RE | Admit: 2019-11-22 | Discharge: 2019-11-22 | Disposition: A | Discharge status: Home / Self Care | Payer: Medicare Other | Attending: Internal Medicine | Admitting: Internal Medicine

## 2019-11-22 DIAGNOSIS — Z79899 Other long term (current) drug therapy: Secondary | ICD-10-CM | POA: Diagnosis not present

## 2019-11-22 DIAGNOSIS — K269 Duodenal ulcer, unspecified as acute or chronic, without hemorrhage or perforation: Secondary | ICD-10-CM | POA: Insufficient documentation

## 2019-11-22 DIAGNOSIS — K259 Gastric ulcer, unspecified as acute or chronic, without hemorrhage or perforation: Secondary | ICD-10-CM | POA: Insufficient documentation

## 2019-11-22 DIAGNOSIS — K766 Portal hypertension: Secondary | ICD-10-CM | POA: Insufficient documentation

## 2019-11-22 DIAGNOSIS — M069 Rheumatoid arthritis, unspecified: Secondary | ICD-10-CM | POA: Diagnosis not present

## 2019-11-22 DIAGNOSIS — F329 Major depressive disorder, single episode, unspecified: Secondary | ICD-10-CM | POA: Insufficient documentation

## 2019-11-22 DIAGNOSIS — K3189 Other diseases of stomach and duodenum: Secondary | ICD-10-CM

## 2019-11-22 DIAGNOSIS — Z8619 Personal history of other infectious and parasitic diseases: Secondary | ICD-10-CM | POA: Diagnosis not present

## 2019-11-22 DIAGNOSIS — K7030 Alcoholic cirrhosis of liver without ascites: Secondary | ICD-10-CM | POA: Diagnosis not present

## 2019-11-22 DIAGNOSIS — F17210 Nicotine dependence, cigarettes, uncomplicated: Secondary | ICD-10-CM | POA: Insufficient documentation

## 2019-11-22 DIAGNOSIS — Z13810 Encounter for screening for upper gastrointestinal disorder: Secondary | ICD-10-CM | POA: Diagnosis not present

## 2019-11-22 DIAGNOSIS — K7460 Unspecified cirrhosis of liver: Secondary | ICD-10-CM

## 2019-11-22 DIAGNOSIS — I10 Essential (primary) hypertension: Secondary | ICD-10-CM | POA: Diagnosis not present

## 2019-11-22 DIAGNOSIS — K219 Gastro-esophageal reflux disease without esophagitis: Secondary | ICD-10-CM | POA: Insufficient documentation

## 2019-11-22 DIAGNOSIS — J449 Chronic obstructive pulmonary disease, unspecified: Secondary | ICD-10-CM | POA: Diagnosis not present

## 2019-11-22 DIAGNOSIS — Z23 Encounter for immunization: Secondary | ICD-10-CM

## 2019-11-22 HISTORY — PX: ESOPHAGOGASTRODUODENOSCOPY (EGD) WITH PROPOFOL: SHX5813

## 2019-11-22 HISTORY — PX: BIOPSY: SHX5522

## 2019-11-22 SURGERY — ESOPHAGOGASTRODUODENOSCOPY (EGD) WITH PROPOFOL
Anesthesia: General

## 2019-11-22 MED ORDER — GLYCOPYRROLATE 0.2 MG/ML IJ SOLN
INTRAMUSCULAR | Status: DC | PRN
  Administered 2019-11-22 (×2): .1 mg via INTRAVENOUS

## 2019-11-22 MED ORDER — KETAMINE HCL 10 MG/ML IJ SOLN
INTRAMUSCULAR | Status: DC | PRN
  Administered 2019-11-22 (×2): 10 mg via INTRAVENOUS

## 2019-11-22 MED ORDER — LACTATED RINGERS IV SOLN: INTRAVENOUS | Status: DC | PRN

## 2019-11-22 MED ORDER — PROPOFOL 10 MG/ML IV BOLUS
INTRAVENOUS | Status: AC
  Filled 2019-11-22: qty 20

## 2019-11-22 MED ORDER — PROPOFOL 500 MG/50ML IV EMUL
INTRAVENOUS | Status: DC | PRN
  Administered 2019-11-22: 125 ug/kg/min via INTRAVENOUS

## 2019-11-22 MED ORDER — LIDOCAINE VISCOUS HCL 2 % MT SOLN
15.0000 mL | Freq: Once | OROMUCOSAL | Status: AC
  Administered 2019-11-22: 5 mL via OROMUCOSAL

## 2019-11-22 MED ORDER — LACTATED RINGERS IV SOLN: Freq: Once | INTRAVENOUS | Status: AC

## 2019-11-22 MED ORDER — IPRATROPIUM-ALBUTEROL 0.5-2.5 (3) MG/3ML IN SOLN
RESPIRATORY_TRACT | Status: AC
  Filled 2019-11-22: qty 3

## 2019-11-22 MED ORDER — CHLORHEXIDINE GLUCONATE CLOTH 2 % EX PADS: 6.0000 | MEDICATED_PAD | Freq: Once | CUTANEOUS | Status: DC

## 2019-11-22 MED ORDER — LIDOCAINE VISCOUS HCL 2 % MT SOLN
OROMUCOSAL | Status: AC
  Filled 2019-11-22: qty 15

## 2019-11-22 MED ORDER — LIDOCAINE HCL (CARDIAC) PF 100 MG/5ML IV SOSY
PREFILLED_SYRINGE | INTRAVENOUS | Status: DC | PRN
  Administered 2019-11-22: 50 mg via INTRATRACHEAL

## 2019-11-22 MED ORDER — IPRATROPIUM-ALBUTEROL 0.5-2.5 (3) MG/3ML IN SOLN
3.0000 mL | Freq: Once | RESPIRATORY_TRACT | Status: AC
  Administered 2019-11-22: 3 mL via RESPIRATORY_TRACT

## 2019-11-22 MED ORDER — KETAMINE HCL 50 MG/5ML IJ SOSY
PREFILLED_SYRINGE | INTRAMUSCULAR | Status: AC
  Filled 2019-11-22: qty 5

## 2019-11-22 MED ORDER — PROPOFOL 10 MG/ML IV BOLUS
INTRAVENOUS | Status: DC | PRN
  Administered 2019-11-22: 30 mg via INTRAVENOUS

## 2019-11-22 NOTE — Op Note (Signed)
[facility] Patient Name: Sabrina Wyatt Procedure Date: 11/22/2019 12:41 PM MRN: [id number] Date of Birth: 12/22/1950 Attending MD: Norvel Richards , MD CSN: [id number] Age: 69 Admit Type: Outpatient Procedure:                Upper GI endoscopy Indications:              Screening procedure Providers:                Norvel Richards, MD, Janeece Riggers, RN, Randa Spike, Technician Referring MD:              Medicines:                Propofol per Anesthesia Complications:            No immediate complications. Estimated Blood Loss:     Estimated blood loss was minimal. Procedure:                Pre-Anesthesia Assessment:                           - Prior to the procedure, a History and Physical                            was performed, and patient medications and                            allergies were reviewed. The patient's tolerance of                            previous anesthesia was also reviewed. The risks                            and benefits of the procedure and the sedation                            options and risks were discussed with the patient.                            All questions were answered, and informed consent                            was obtained. Prior Anticoagulants: The patient has                            taken no previous anticoagulant or antiplatelet                            agents. ASA Grade Assessment: III - A patient with                            severe systemic disease. After reviewing the risks  and benefits, the patient was deemed in                            satisfactory condition to undergo the procedure.                           After obtaining informed consent, the endoscope was                            passed under direct vision. Throughout the                            procedure, the patient's blood pressure, pulse, and                            oxygen  saturations were monitored continuously. The                            [id number] scope was introduced through the                            mouth, and advanced to the second part of duodenum.                            The upper GI endoscopy was accomplished without                            difficulty. The patient tolerated the procedure                            well. Scope In: 1:14:34 PM Scope Out: 1:24:35 PM Total Procedure Duration: 0 hours 10 minutes 1 second  Findings:      The examined esophagus was normal. Some retained food in the stomach (a       last intake of solid food 2100 last evening).      Diffuse moderate mucosal changes of portal gastropathy were found in the       entire examined stomach. No esophageal varices. Area of       prepyloric/antral scarring with surrounding intense erythema suggestive       of healing gastric ulcers. No infiltrating process seen. Patent pylorus.       Examination the bulb and second portion revealed a 2 cm adenomatous       appearing/partially submucosal appearing nodule in the second portion of       the duodenum. It had slight central depression. It was biopsied.      So, the abnormal antrum was biopsied. Impression:               - Normal esophagus.                           -Portal hypertensive gastropathy. Scar and                            inflammation in the antrum status post biopsy.  Retained gastric contents made the exam more                            difficult.                           -Duodenal nodule - status post biopsy Moderate Sedation:      Moderate (conscious) sedation was personally administered by an       anesthesia professional. The following parameters were monitored: oxygen       saturation, heart rate, blood pressure, respiratory rate, EKG, adequacy       of pulmonary ventilation, and response to care. Recommendation:           - Patient has a contact number available  for                            emergencies. The signs and symptoms of potential                            delayed complications were discussed with the                            patient. Return to normal activities tomorrow.                            Written discharge instructions were provided to the                            patient.                           - Advance diet as tolerated.                           - Continue present medications. Having of                            subsequent exam to be determined. Follow-up on                            pathology. Procedure Code(s):        --- Professional ---                           [phone], Esophagogastroduodenoscopy, flexible,                            transoral; diagnostic, including collection of                            specimen(s) by brushing or washing, when performed                            (separate procedure) Diagnosis Code(s):        --- Professional ---  K31.89, Other diseases of stomach and duodenum                           Z13.810, Encounter for screening for upper                            gastrointestinal disorder CPT copyright 2019 American Medical Association. All rights reserved. The codes documented in this report are preliminary and upon coder review may  be revised to meet current compliance requirements. Sabrina Wyatt. Sabrina Yanes, MD Norvel Richards, MD 11/22/2019 1:34:51 PM This report has been signed electronically. Number of Addenda: 0

## 2019-11-22 NOTE — Anesthesia Postprocedure Evaluation (Signed)
Anesthesia Post Note  Patient: Sabrina Wyatt  Procedure(s) Performed: ESOPHAGOGASTRODUODENOSCOPY (EGD) WITH PROPOFOL (N/A ) BIOPSY  Patient location during evaluation: Phase II Anesthesia Type: General Level of consciousness: awake and alert Pain management: pain level controlled Vital Signs Assessment: post-procedure vital signs reviewed and stable Respiratory status: spontaneous breathing Cardiovascular status: stable Postop Assessment: no apparent nausea or vomiting Anesthetic complications: no     Last Vitals:  Vitals:   11/22/19 1238  BP: 122/74  Pulse: 84  Resp: 19  Temp: 36.8 C  SpO2: 95%    Last Pain:  Vitals:   11/22/19 1238  TempSrc: Oral                 Earl Losee Hristova

## 2019-11-22 NOTE — Anesthesia Preprocedure Evaluation (Addendum)
Anesthesia Evaluation  Patient identified by MRN, date of birth, ID band Patient awake    Reviewed: Allergy & Precautions, NPO status , Patient's Chart, lab work & pertinent test results  History of Anesthesia Complications Negative for: history of anesthetic complications  Airway Mallampati: II  TM Distance: >3 FB Neck ROM: Full    Dental  (+) Edentulous Upper, Edentulous Lower   Pulmonary shortness of breath and with exertion, COPD,  COPD inhaler, Current SmokerPatient did not abstain from smoking.,   Patient was wheezing, duoneb nebulizer treatment was given. Lungs clear after treatment. Pulmonary exam normal breath sounds clear to auscultation (-) wheezing      Cardiovascular Exercise Tolerance: Poor hypertension, Pt. on medications  Rhythm:Regular Rate:Normal     Neuro/Psych Seizures -, Well Controlled,  PSYCHIATRIC DISORDERS Anxiety Depression  Neuromuscular disease CVA    GI/Hepatic GERD  Medicated and Controlled,(+) Cirrhosis     substance abuse  alcohol use, Hepatitis -, C  Endo/Other    Renal/GU      Musculoskeletal  (+) Arthritis ,   Abdominal   Peds  Hematology  (+) anemia ,   Anesthesia Other Findings   Reproductive/Obstetrics                            Anesthesia Physical Anesthesia Plan  ASA: IV  Anesthesia Plan: General   Post-op Pain Management:    Induction: Intravenous  PONV Risk Score and Plan: 2 and Treatment may vary due to age or medical condition  Airway Management Planned: Nasal Cannula and Natural Airway  Additional Equipment:   Intra-op Plan:   Post-operative Plan: Possible Post-op intubation/ventilation  Informed Consent: I have reviewed the patients History and Physical, chart, labs and discussed the procedure including the risks, benefits and alternatives for the proposed anesthesia with the patient or authorized representative who has  indicated his/her understanding and acceptance.       Plan Discussed with: CRNA and Surgeon  Anesthesia Plan Comments:         Anesthesia Quick Evaluation

## 2019-11-22 NOTE — Transfer of Care (Signed)
Immediate Anesthesia Transfer of Care Note  Patient: Sabrina Wyatt  Procedure(s) Performed: ESOPHAGOGASTRODUODENOSCOPY (EGD) WITH PROPOFOL (N/A ) BIOPSY  Patient Location: PACU  Anesthesia Type:General  Level of Consciousness: awake  Airway & Oxygen Therapy: Patient Spontanous Breathing  Post-op Assessment: Report given to RN and Post -op Vital signs reviewed and stable  Post vital signs: Reviewed and stable  Last Vitals:  Vitals Value Taken Time  BP    Temp    Pulse    Resp    SpO2      Last Pain:  Vitals:   11/22/19 1238  TempSrc: Oral         Complications: No apparent anesthesia complications

## 2019-11-22 NOTE — Discharge Instructions (Signed)
EGD Discharge instructions Please read the instructions outlined below and refer to this sheet in the next few weeks. These discharge instructions provide you with general information on caring for yourself after you leave the hospital. Your doctor may also give you specific instructions. While your treatment has been planned according to the most current medical practices available, unavoidable complications occasionally occur. If you have any problems or questions after discharge, please call your doctor. ACTIVITY  You may resume your regular activity but move at a slower pace for the next 24 hours.   Take frequent rest periods for the next 24 hours.   Walking will help expel (get rid of) the air and reduce the bloated feeling in your abdomen.   No driving for 24 hours (because of the anesthesia (medicine) used during the test).   You may shower.   Do not sign any important legal documents or operate any machinery for 24 hours (because of the anesthesia used during the test).  NUTRITION  Drink plenty of fluids.   You may resume your normal diet.   Begin with a light meal and progress to your normal diet.   Avoid alcoholic beverages for 24 hours or as instructed by your caregiver.  MEDICATIONS  You may resume your normal medications unless your caregiver tells you otherwise.  WHAT YOU CAN EXPECT TODAY  You may experience abdominal discomfort such as a feeling of fullness or "gas" pains.  FOLLOW-UP  Your doctor will discuss the results of your test with you.  SEEK IMMEDIATE MEDICAL ATTENTION IF ANY OF THE FOLLOWING OCCUR:  Excessive nausea (feeling sick to your stomach) and/or vomiting.   Severe abdominal pain and distention (swelling).   Trouble swallowing.   Temperature over 101 F (37.8 C).   Rectal bleeding or vomiting of blood.   No esophageal varices found today.  Your stomach was inflamed and it looks like he have had some ulcers recently which have  healed.  You also had a nodule in your duodenum.  Biopsies were taken.  Further recommendations to follow pending review of pathology report

## 2019-11-22 NOTE — H&P (Signed)
@LOGO @   Primary Care Physician:  Lucia Gaskins, MD Primary Gastroenterologist:  Dr. Gala Romney Pre-Procedure History & Physical: HPI:  Sabrina Wyatt is a 69 y.o. female here for for screening EGD.  History of HCV/alcohol related cirrhosis.  Portal gastropathy only seen in 2018 EGD.  She is here for screening examination. Patient denies dysphagia or any upper GI tract symptoms currently.  Past Medical History:  Diagnosis Date  . Abdominal wall pain    chronic  . Anemia   . Arthritis    rheumatoid  . Biliary stone   . Chronic abdominal pain   . Chronic flank pain   . Chronic low back pain   . Cirrhosis, hepatitis C    U/S on 03/09/16= no HCC, pt has been vaccinated for Hep A and Hep B. s/p treatment of HCV with eradication  . Depression   . GERD (gastroesophageal reflux disease)   . Gonorrhea   . Hypertension   . 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 Intussusception ([place]) 04/2012  . Polysubstance abuse ([place])    HX of  . PTSD (post-traumatic stress disorder)   . Pulmonary nodules   . S/P colonoscopy 2005   Dr Moss Mc polyp removed, otherwise normal  . S/P endoscopy 08/27/10   antral erosions, otherwise normal, due egd 08/2012 to screen for varices  . Seizures ([place]) 1998   with brain bleed x1. None since then and on no meds  . Shortness of breath   . Tobacco abuse     Past Surgical History:  Procedure Laterality Date  . biliary stone removal  2015  . BIOPSY N/A 03/06/2015   Procedure: BIOPSY;  Surgeon: Daneil Dolin, MD;  Location: [facility];  Service: Endoscopy;  Laterality: N/A;  . CARPAL TUNNEL RELEASE Bilateral 12/2010  . CATARACT EXTRACTION W/PHACO Right 01/15/2016   Procedure: CATARACT EXTRACTION PHACO AND INTRAOCULAR LENS PLACEMENT RIGHT EYE CDE=5.88;  Surgeon: Tonny Branch, MD;  Location: [facility];  Service: Ophthalmology;  Laterality: Right;  . CATARACT EXTRACTION W/PHACO Left 02/12/2016   Procedure: CATARACT EXTRACTION PHACO AND INTRAOCULAR LENS PLACEMENT  LEFT EYE; CDE:  6.02;  Surgeon: Tonny Branch, MD;  Location: [facility];  Service: Ophthalmology;  Laterality: Left;  . CHOLECYSTECTOMY  2002  . COLONOSCOPY    09/10/2003   diminutive polyp in the rectum cold biopsied/removed/ Normal colon  . COLONOSCOPY WITH PROPOFOL N/A 08/02/2013   [id number] diverticulosis. next TCS 10 years.  . ESOPHAGOGASTRODUODENOSCOPY    09/10/2003   Small hiatal hernia; otherwise normal stomach, normal D1 and D2  . ESOPHAGOGASTRODUODENOSCOPY  08/27/2010   [id number] esophagus.  No varices.  Couple of tiny antral erosions of doubtful clinical significance, otherwise normal stomach, D1 and D2.  . ESOPHAGOGASTRODUODENOSCOPY (EGD) WITH PROPOFOL N/A 08/02/2013   RMR: Portal gastropathy. No explanation for abdominal pain which I believe is more abdominal wall in origin. She has been seen by Dr. Carlis Abbott  over at [facility]. She is up-to-date on cross sectional imaging. She has a pain management physician in [place]. Repeat EGD for varices in 3 years.  . ESOPHAGOGASTRODUODENOSCOPY (EGD) WITH PROPOFOL N/A 03/06/2015   [id number] gastric mucosac s/p bx  . ESOPHAGOGASTRODUODENOSCOPY (EGD) WITH PROPOFOL N/A 04/18/2017   normal esophagus, portal hypertensive gastropathy, normal duodenum, no specimens collected. Surveillance in Aug 2020  . LUMBAR FUSION    . NOSE SURGERY  1970  . PERCUTANEOUS TRANSHEPATIC CHOLANGIOGRAHPY AND BILLIARY DRAINAGE  2002  . REPAIR VAGINAL CUFF N/A 10/25/2015   Procedure: REPAIR  VAGINAL LACERATION ;  Surgeon: Jonnie Kind, MD;  Location: [facility];  Service: Gynecology;  Laterality: N/A;  . Roux-en-y-hepatojejunostomy  2003   revision in 2013 for intussusception [facility] Dr. Bailey Mech)  . SPINAL FUSION     C5-C7    Prior to Admission medications   Medication Sig Start Date End Date Taking? Authorizing Provider  acyclovir (ZOVIRAX) 400 MG tablet Take 1 tablet (400 mg total) by mouth 2 (two) times daily. 10/03/19  Yes Jonnie Kind, MD  albuterol  (PROVENTIL) (2.5 MG/3ML) 0.083% nebulizer solution Take 2.5 mg by nebulization 2 (two) times daily as needed for wheezing or shortness of breath.    Yes [provider]  ascorbic acid (VITAMIN C) 250 MG CHEW Chew 250 mg by mouth daily.   Yes [provider]  atenolol (TENORMIN) 25 MG tablet Take 25 mg by mouth every morning.    Yes [provider]  brexpiprazole (REXULTI) 1 MG TABS tablet Take 1 mg by mouth daily.   Yes [provider]  Cholecalciferol (VITAMIN D) 50 MCG (2000 UT) CAPS Take 10,000 Units by mouth daily.   Yes [provider]  DULoxetine (CYMBALTA) 60 MG capsule Take 60 mg by mouth daily.    Yes [provider]  EPIPEN 2-PAK 0.3 MG/0.3ML SOAJ injection Inject 0.3 mg into the skin as needed (allergic reactions).  03/04/13  Yes [provider]  etodolac (LODINE) 400 MG tablet Take 400 mg by mouth daily. 07/04/19  Yes [provider]  Flaxseed, Linseed, (FLAXSEED OIL PO) Take 1 capsule by mouth daily.   Yes [provider]  fluticasone (FLONASE) 50 MCG/ACT nasal spray Place 1 spray into both nostrils 2 (two) times daily.    Yes [provider]  loratadine (CLARITIN) 10 MG tablet Take 10 mg by mouth daily.    Yes [provider]  omeprazole (PRILOSEC) 20 MG capsule TAKE ONE CAPSULE BY MOUTH EVERY DAY Patient taking differently: Take 20 mg by mouth daily.  09/10/12  Yes Andria Meuse, NP  oxyCODONE (OXY IR/ROXICODONE) 5 MG immediate release tablet Take 5 mg by mouth 4 (four) times daily.    Yes [provider]  pravastatin (PRAVACHOL) 40 MG tablet Take 40 mg by mouth daily. 07/04/19  Yes [provider]  PROAIR HFA 108 (90 BASE) MCG/ACT inhaler Inhale 2 puffs into the lungs every 6 (six) hours as needed for wheezing or shortness of breath.  02/21/12  Yes [provider]  tiZANidine (ZANAFLEX) 2 MG tablet Take 4 mg by mouth daily. 08/07/19  Yes [provider]     Allergies as of 07/05/2019 - Review Complete 07/05/2019  Allergen Reaction Noted  . Asa [aspirin] Nausea Only 04/13/2016  . Bee venom Hives 07/10/2015  . Codeine Nausea Only 02/18/2017  . Tylenol [acetaminophen] Nausea And Vomiting and Swelling 07/10/2015    Family History  Problem Relation Age of Onset  . Coronary artery disease Brother   . Cancer Sister        lymphoma  . Cancer Father        brain    Social History   Socioeconomic History  . Marital status: Single    Spouse name: Not on file  . Number of children: 1  . Years of education: Not on file  . Highest education level: Not on file  Occupational History  . Occupation: disabled    Fish farm manager: UNEMPLOYED  Tobacco Use  . Smoking status: Current Every Day Smoker  Years: 33.00    Types: Cigarettes  . Smokeless tobacco: Never Used  . Tobacco comment: 4 cig daily  Substance and Sexual Activity  . Alcohol use: Yes    Alcohol/week: 0.0 standard drinks    Comment: occasionally   . Drug use: No  . Sexual activity: Yes    Partners: Male    Birth control/protection: Post-menopausal, Condom  Other Topics Concern  . Not on file  Social History Narrative  . Not on file   Social Determinants of Health   Financial Resource Strain:   . Difficulty of Paying Living Expenses:   Food Insecurity:   . Worried About Charity fundraiser in the Last Year:   . Arboriculturist in the Last Year:   Transportation Needs:   . Film/video editor (Medical):   Marland Kitchen Lack of Transportation (Non-Medical):   Physical Activity:   . Days of Exercise per Week:   . Minutes of Exercise per Session:   Stress:   . Feeling of Stress :   Social Connections:   . Frequency of Communication with Friends and Family:   . Frequency of Social Gatherings with Friends and Family:   . Attends Religious Services:   . Active Member of Clubs or Organizations:   . Attends Archivist Meetings:   Marland Kitchen Marital Status:   Intimate Partner  Violence:   . Fear of Current or Ex-Partner:   . Emotionally Abused:   Marland Kitchen Physically Abused:   . Sexually Abused:     Review of Systems: See HPI, otherwise negative ROS  Physical Exam: BP 122/74   Pulse 84   Temp 98.3 F (36.8 C) (Oral)   Resp 19   Ht 5\' 2"  (1.575 m)   Wt 79.4 kg   SpO2 95%   BMI 32.01 kg/m  General:   Alert,  Well-developed, well-nourished, pleasant and cooperative in NAD  lesions. Mouth:  No deformity or lesions. Neck:  Supple; no masses or thyromegaly. No significant cervical adenopathy. Lungs:  Clear throughout to auscultation.   No wheezes, crackles, or rhonchi. No acute distress. Heart:  Regular rate and rhythm; no murmurs, clicks, rubs,  or gallops. Abdomen: Non-distended, normal bowel sounds.  Soft and nontender without appreciable mass or hepatosplenomegaly.   Impression/Plan: 69 year old lady with HCV/EtOH related cirrhosis.  Here for screening EGD. The risks, benefits, limitations, alternatives and imponderables have been reviewed with the patient. Potential for esophageal dilation, biopsy, etc. have also been reviewed.  Questions have been answered. All parties agreeable.     Notice: This dictation was prepared with Dragon dictation along with smaller phrase technology. Any transcriptional errors that result from this process are unintentional and may not be corrected upon review.

## 2019-11-22 NOTE — Progress Notes (Signed)
   Covid-19 Vaccination Clinic  Name:  Sabrina Wyatt    MRN: [id number] DOB: 01/13/1951  11/22/2019  Sabrina Wyatt was observed post Covid-19 immunization for 15 minutes without incident. She was provided with Vaccine Information Sheet and instruction to access the V-Safe system.   Sabrina Wyatt was instructed to call 911 with any severe reactions post vaccine: Marland Kitchen Difficulty breathing  . Swelling of face and throat  . A fast heartbeat  . A bad rash all over body  . Dizziness and weakness   Immunizations Administered    Name Date Dose VIS Date Route   Moderna COVID-19 Vaccine 11/22/2019  8:25 AM 0.5 mL 07/24/2019 Intramuscular   Manufacturer: Moderna   Lot: [id number]   [id number]

## 2019-11-23 ENCOUNTER — Other Ambulatory Visit: Payer: Self-pay

## 2019-11-23 LAB — SURGICAL PATHOLOGY

## 2019-11-24 ENCOUNTER — Encounter: Payer: Self-pay | Admitting: Internal Medicine

## 2019-11-26 ENCOUNTER — Telehealth: Payer: Self-pay

## 2019-11-26 DIAGNOSIS — K3189 Other diseases of stomach and duodenum: Secondary | ICD-10-CM

## 2019-11-26 NOTE — Telephone Encounter (Signed)
Noted. Letter mailed to pt. 

## 2019-11-26 NOTE — Telephone Encounter (Signed)
Per RMR- Send letter to patient.  Send copy of letter with path to referring provider and PCP.   Eric - see photo of duodenal nodule in endo report - needs EUS in my opinion - lets get a refferal going to Dr. Rush Landmark. thx

## 2019-11-27 NOTE — Telephone Encounter (Signed)
Please send referral to Dr. Rush Landmark in [place] for EUS for duodenal nodule.

## 2019-11-27 NOTE — Telephone Encounter (Signed)
Referral placed.

## 2019-11-27 NOTE — Addendum Note (Signed)
Addended by: Cheron Every on: 11/27/2019 04:45 PM   Modules accepted: Orders

## 2019-11-29 ENCOUNTER — Telehealth: Payer: Self-pay

## 2019-11-29 NOTE — Telephone Encounter (Signed)
Mansouraty, Telford Nab., MD  Timothy Lasso, RN; Milus Banister, MD  Ioanna Colquhoun, This is an interesting case. A duodenal nodule that looks as if it could be adenomatous although superficial biopsies only showed peptic metaplasia.   I think an EUS with possible EMR is reasonable.   I think it would be ideal for the patient to have opportunity to meet DJ or myself before we do an EMR.   If she wanted to just move forward with a repeat EUS and superficial biopsy first before consideration of a more invasive resection then she can just be scheduled as an EUS with DJ or myself. Please let DJ and I and Dr. Gala Romney know what she decides. Thanks. GM

## 2019-11-29 NOTE — Telephone Encounter (Signed)
Thanks for update. GM 

## 2019-11-29 NOTE — Telephone Encounter (Signed)
I spoke with the pt and she agreed to make an appt to speak with Dr Rush Landmark on 12/12/19 at 230 pm to discuss EUS/EMR.  Information mailed to her home.

## 2019-12-12 ENCOUNTER — Encounter: Payer: Self-pay | Admitting: Gastroenterology

## 2019-12-12 ENCOUNTER — Ambulatory Visit (INDEPENDENT_AMBULATORY_CARE_PROVIDER_SITE_OTHER): Payer: Medicare Other | Admitting: Gastroenterology

## 2019-12-12 ENCOUNTER — Other Ambulatory Visit (INDEPENDENT_AMBULATORY_CARE_PROVIDER_SITE_OTHER): Payer: Medicare Other

## 2019-12-12 VITALS — BP 124/72 | HR 91 | Temp 98.9°F | Ht 65.0 in | Wt 174.0 lb

## 2019-12-12 DIAGNOSIS — K3189 Other diseases of stomach and duodenum: Secondary | ICD-10-CM | POA: Diagnosis not present

## 2019-12-12 DIAGNOSIS — K7460 Unspecified cirrhosis of liver: Secondary | ICD-10-CM

## 2019-12-12 DIAGNOSIS — R1084 Generalized abdominal pain: Secondary | ICD-10-CM | POA: Diagnosis not present

## 2019-12-12 LAB — BASIC METABOLIC PANEL
BUN: 12 mg/dL (ref 6–23)
CO2: 29 mEq/L (ref 19–32)
Calcium: 9.2 mg/dL (ref 8.4–10.5)
Chloride: 102 mEq/L (ref 96–112)
Creatinine, Ser: 0.71 mg/dL (ref 0.40–1.20)
GFR: 81.71 mL/min (ref >60.00)
Glucose, Bld: 103 mg/dL — ABNORMAL HIGH (ref 70–99)
Potassium: 4.2 mEq/L (ref 3.5–5.1)
Sodium: 138 mEq/L (ref 135–145)

## 2019-12-12 NOTE — Patient Instructions (Signed)
If you are age 69 or older, your body mass index should be between 23-30. Your Body mass index is 28.96 kg/m. If this is out of the aforementioned range listed, please consider follow up with your Primary Care Provider.  If you are age 56 or younger, your body mass index should be between 19-25. Your Body mass index is 28.96 kg/m. If this is out of the aformentioned range listed, please consider follow up with your Primary Care Provider.   Your provider has requested that you go to the basement level for lab work before leaving today. Press "B" on the elevator. The lab is located at the first door on the left as you exit the elevator.  Due to recent changes in healthcare laws, you may see the results of your imaging and laboratory studies on MyChart before your provider has had a chance to review them.  We understand that in some cases there may be results that are confusing or concerning to you. Not all laboratory results come back in the same time frame and the provider may be waiting for multiple results in order to interpret others.  Please give [place] 48 hours in order for your provider to thoroughly review all the results before contacting the office for clarification of your results.   You have been scheduled for an endoscopy- EUS/EMR. Please follow written instructions given to you at your visit today. If you use inhalers (even only as needed), please bring them with you on the day of your procedure.   It was a pleasure to see you today!  Dr. Rush Landmark

## 2019-12-13 ENCOUNTER — Telehealth: Payer: Self-pay

## 2019-12-13 LAB — CBC
HCT: 41.6 % (ref 36.0–46.0)
Hemoglobin: 13.7 g/dL (ref 12.0–15.0)
MCHC: 32.9 g/dL (ref 30.0–36.0)
MCV: 90.7 fl (ref 78.0–100.0)
Platelets: 140 10*3/uL — ABNORMAL LOW (ref 150.0–400.0)
RBC: 4.59 Mil/uL (ref 3.87–5.11)
RDW: 16.4 % — ABNORMAL HIGH (ref 11.5–15.5)
WBC: 8.3 10*3/uL (ref 4.0–10.5)

## 2019-12-13 NOTE — Telephone Encounter (Signed)
Thank you for update. GM 

## 2019-12-13 NOTE — Telephone Encounter (Signed)
The lab called to advise that they had drawn the PT/INR yesterday but the machine was down. They had pulled the tube out today to run and it was bad due to it being in the refrigerator. They have contacted the patient and the patient has agreed to return next week to have the lab re-drawn. They can not come today due to having to give 3 day notice to the company who picks the patient up for appointments.

## 2019-12-14 ENCOUNTER — Encounter: Payer: Self-pay | Admitting: Gastroenterology

## 2019-12-14 ENCOUNTER — Other Ambulatory Visit: Payer: Self-pay

## 2019-12-14 DIAGNOSIS — K3189 Other diseases of stomach and duodenum: Secondary | ICD-10-CM | POA: Insufficient documentation

## 2019-12-14 DIAGNOSIS — R1084 Generalized abdominal pain: Secondary | ICD-10-CM | POA: Insufficient documentation

## 2019-12-14 DIAGNOSIS — K7460 Unspecified cirrhosis of liver: Secondary | ICD-10-CM

## 2019-12-14 NOTE — H&P (View-Only) (Signed)
[facility] VISIT   Primary Care Provider Lucia Gaskins, MD [facility] [place] [id number] [phone]  Referring Provider Dr. Gala Romney and NP Gordy Levan  Patient Profile: Sabrina Wyatt is a 69 y.o. female with a pmh significant for cirrhosis (secondary to previous HCV status post eradication and alcohol use with portal hypertensive gastropathy and thrombocytopenia), arthritis, MDD/anxiety/PTSD, seizure disorder, status post cholecystectomy, chronic abdominal pain, GERD.  The patient presents to the [facility] for an evaluation and management of problem(s) noted below:  Problem List 1. Duodenal nodule   2. Generalized abdominal pain   3. Cirrhosis of liver without ascites, unspecified hepatic cirrhosis type ([place])     History of Present Illness This is the patient's first visit to the outpatient [place] clinic.  She is followed by Dr. Gala Romney and NP Gordy Levan for her chronic liver disease attributed to her prior HCV as well as alcohol use.  She has portal hypertension as evidenced by her PhD and thrombocytopenia.  She recently underwent a variceal screening and was found to have a duodenal abnormality/duodenal nodule.  Biopsies of this returned showing evidence of pyloric metaplasia however the imaging endoscopically was concerning for potential underlying adenomatous process.  It is for this reason that the patient is referred to discuss potential endoscopic ultrasound and potential endoscopic resection of this lesion.  The patient describes a significantly difficult cholecystectomy years ago.  She wonders if these changes could be a result of that although it had not been noted on prior endoscopies, which makes that much less likely.  The patient does have chronic abdominal discomfort in multiple areas of her abdomen.  She does not take significant nonsteroidals or BC/Goody powders.  There is no family history of GI malignancies.  She denies  any significant early satiety.  GI Review of Systems Positive as above Negative for dysphagia, odynophagia, change in bowel habits, melena, hematochezia  Review of Systems General: Denies fevers/chills/weight loss Cardiovascular: Denies chest pain/palpitations Pulmonary: Denies shortness of breath/cough Gastroenterological: See HPI Genitourinary: Denies darkened urine or hematuria Hematological: Positive for easy bruising/bleeding Dermatological: Denies jaundice currently Psychological: Mood is stable   Medications Current Outpatient Medications  Medication Sig Dispense Refill  . acyclovir (ZOVIRAX) 400 MG tablet Take 1 tablet (400 mg total) by mouth 2 (two) times daily. 180 tablet 3  . albuterol (PROVENTIL) (2.5 MG/3ML) 0.083% nebulizer solution Take 2.5 mg by nebulization 2 (two) times daily as needed for wheezing or shortness of breath.     Marland Kitchen ascorbic acid (VITAMIN C) 250 MG CHEW Chew 250 mg by mouth daily.    Marland Kitchen atenolol (TENORMIN) 25 MG tablet Take 25 mg by mouth every morning.     . brexpiprazole (REXULTI) 1 MG TABS tablet Take 1 mg by mouth daily.    . Cholecalciferol (VITAMIN D) 50 MCG (2000 UT) CAPS Take 10,000 Units by mouth daily.    . DULoxetine (CYMBALTA) 60 MG capsule Take 60 mg by mouth daily.     Marland Kitchen EPIPEN 2-PAK 0.3 MG/0.3ML SOAJ injection Inject 0.3 mg into the skin as needed (allergic reactions).     Marland Kitchen etodolac (LODINE) 400 MG tablet Take 400 mg by mouth daily.    . Flaxseed, Linseed, (FLAXSEED OIL PO) Take 1 capsule by mouth daily.    . fluticasone (FLONASE) 50 MCG/ACT nasal spray Place 1 spray into both nostrils 2 (two) times daily.     Marland Kitchen loratadine (CLARITIN) 10 MG tablet Take 10 mg by mouth daily.     Marland Kitchen  omeprazole (PRILOSEC) 20 MG capsule TAKE ONE CAPSULE BY MOUTH EVERY DAY (Patient taking differently: Take 20 mg by mouth daily. ) 30 capsule 11  . oxyCODONE (OXY IR/ROXICODONE) 5 MG immediate release tablet Take 5 mg by mouth 4 (four) times daily.     .  pravastatin (PRAVACHOL) 40 MG tablet Take 40 mg by mouth daily.    Marland Kitchen PROAIR HFA 108 (90 BASE) MCG/ACT inhaler Inhale 2 puffs into the lungs every 6 (six) hours as needed for wheezing or shortness of breath.     Marland Kitchen tiZANidine (ZANAFLEX) 2 MG tablet Take 4 mg by mouth daily.     No current facility-administered medications for this visit.    Allergies Allergies  Allergen Reactions  . Asa [Aspirin] Nausea Only  . Bee Venom Hives  . Codeine Nausea Only  . Tylenol [Acetaminophen] Nausea And Vomiting and Swelling    Swelling of hands and feet    Histories Past Medical History:  Diagnosis Date  . Abdominal wall pain    chronic  . Anemia   . Arthritis    rheumatoid  . Biliary stone   . Chronic abdominal pain   . Chronic flank pain   . Chronic low back pain   . Cirrhosis, hepatitis C    U/S on 03/09/16= no HCC, pt has been vaccinated for Hep A and Hep B. s/p treatment of HCV with eradication  . Depression   . Gall stones, common bile duct 2002  . GERD (gastroesophageal reflux disease)   . Gonorrhea   . Hypertension   . 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 Intussusception ([place]) 04/2012  . Polysubstance abuse ([place])    HX of  . PTSD (post-traumatic stress disorder)   . Pulmonary nodules   . S/P colonoscopy 2005   Dr Moss Mc polyp removed, otherwise normal  . S/P endoscopy 08/27/10   antral erosions, otherwise normal, due egd 08/2012 to screen for varices  . Seizures ([place]) 1998   with brain bleed x1. None since then and on no meds  . Shortness of breath   . Tobacco abuse    Past Surgical History:  Procedure Laterality Date  . biliary stone removal  2015  . BIOPSY N/A 03/06/2015   Procedure: BIOPSY;  Surgeon: Daneil Dolin, MD;  Location: [facility]S;  Service: Endoscopy;  Laterality: N/A;  . BIOPSY  11/22/2019   Procedure: BIOPSY;  Surgeon: Daneil Dolin, MD;  Location: [facility];  Service: Endoscopy;;  . CARPAL TUNNEL RELEASE Bilateral 12/2010  . CATARACT  EXTRACTION W/PHACO Right 01/15/2016   Procedure: CATARACT EXTRACTION PHACO AND INTRAOCULAR LENS PLACEMENT RIGHT EYE CDE=5.88;  Surgeon: Tonny Branch, MD;  Location: [facility];  Service: Ophthalmology;  Laterality: Right;  . CATARACT EXTRACTION W/PHACO Left 02/12/2016   Procedure: CATARACT EXTRACTION PHACO AND INTRAOCULAR LENS PLACEMENT LEFT EYE; CDE:  6.02;  Surgeon: Tonny Branch, MD;  Location: [facility];  Service: Ophthalmology;  Laterality: Left;  . CHOLECYSTECTOMY  2002  . COLONOSCOPY    09/10/2003   diminutive polyp in the rectum cold biopsied/removed/ Normal colon  . COLONOSCOPY WITH PROPOFOL N/A 08/02/2013   [id number] diverticulosis. next TCS 10 years.  . ESOPHAGOGASTRODUODENOSCOPY    09/10/2003   Small hiatal hernia; otherwise normal stomach, normal D1 and D2  . ESOPHAGOGASTRODUODENOSCOPY  08/27/2010   [id number] esophagus.  No varices.  Couple of tiny antral erosions of doubtful clinical significance, otherwise normal stomach, D1 and D2.  . ESOPHAGOGASTRODUODENOSCOPY (EGD) WITH PROPOFOL  N/A 08/02/2013   RMR: Portal gastropathy. No explanation for abdominal pain which I believe is more abdominal wall in origin. She has been seen by Dr. Carlis Abbott  over at [facility]. She is up-to-date on cross sectional imaging. She has a pain management physician in [place]. Repeat EGD for varices in 3 years.  . ESOPHAGOGASTRODUODENOSCOPY (EGD) WITH PROPOFOL N/A 03/06/2015   [id number] gastric mucosac s/p bx  . ESOPHAGOGASTRODUODENOSCOPY (EGD) WITH PROPOFOL N/A 04/18/2017   normal esophagus, portal hypertensive gastropathy, normal duodenum, no specimens collected. Surveillance in Aug 2020  . ESOPHAGOGASTRODUODENOSCOPY (EGD) WITH PROPOFOL N/A 11/22/2019   Procedure: ESOPHAGOGASTRODUODENOSCOPY (EGD) WITH PROPOFOL;  Surgeon: Daneil Dolin, MD;  Location[facility];  Service: Endoscopy;  Laterality: N/A;  1:45pm-office rescheduled to 4/1 @ 1:00pm  . LUMBAR FUSION    . NOSE SURGERY  1970  . PERCUTANEOUS TRANSHEPATIC  CHOLANGIOGRAHPY AND BILLIARY DRAINAGE  2002  . REPAIR VAGINAL CUFF N/A 10/25/2015   Procedure: REPAIR VAGINAL LACERATION ;  Surgeon: Jonnie Kind, MD;  Location: [facility];  Service: Gynecology;  Laterality: N/A;  . Roux-en-y-hepatojejunostomy  2003   revision in 2013 for intussusception [facility] Dr. Bailey Mech)  . SPINAL FUSION     C5-C7   Social History   Socioeconomic History  . Marital status: Single    Spouse name: Not on file  . Number of children: 1  . Years of education: Not on file  . Highest education level: Not on file  Occupational History  . Occupation: disabled    Fish farm manager: UNEMPLOYED  Tobacco Use  . Smoking status: Current Every Day Smoker    Years: 33.00    Types: Cigarettes  . Smokeless tobacco: Never Used  . Tobacco comment: 4 cig daily  Substance and Sexual Activity  . Alcohol use: Yes    Alcohol/week: 0.0 standard drinks    Comment: occasionally   . Drug use: No  . Sexual activity: Yes    Partners: Male    Birth control/protection: Post-menopausal, Condom  Other Topics Concern  . Not on file  Social History Narrative  . Not on file   Social Determinants of Health   Financial Resource Strain:   . Difficulty of Paying Living Expenses:   Food Insecurity:   . Worried About Charity fundraiser in the Last Year:   . Arboriculturist in the Last Year:   Transportation Needs:   . Film/video editor (Medical):   Marland Kitchen Lack of Transportation (Non-Medical):   Physical Activity:   . Days of Exercise per Week:   . Minutes of Exercise per Session:   Stress:   . Feeling of Stress :   Social Connections:   . Frequency of Communication with Friends and Family:   . Frequency of Social Gatherings with Friends and Family:   . Attends Religious Services:   . Active Member of Clubs or Organizations:   . Attends Archivist Meetings:   Marland Kitchen Marital Status:   Intimate Partner Violence:   . Fear of Current or Ex-Partner:   . Emotionally Abused:   Marland Kitchen  Physically Abused:   . Sexually Abused:    Family History  Problem Relation Age of Onset  . Coronary artery disease Brother   . Cancer Sister        lymphoma  . Cancer Father        brain  . Colon cancer Neg Hx   . Pancreatic cancer Neg Hx   . Stomach cancer Neg  Hx   . Esophageal cancer Neg Hx   . Inflammatory bowel disease Neg Hx   . Liver disease Neg Hx   . Rectal cancer Neg Hx    I have reviewed her medical, social, and family history in detail and updated the electronic medical record as necessary.    PHYSICAL EXAMINATION  BP 124/72   Pulse 91   Temp 98.9 F (37.2 C)   Ht 5\' 5"  (1.651 m)   Wt 174 lb (78.9 kg)   SpO2 95%   BMI 28.96 kg/m  Wt Readings from Last 3 Encounters:  12/12/19 174 lb (78.9 kg)  11/22/19 175 lb (79.4 kg)  11/20/19 170 lb (77.1 kg)  GEN: NAD, appears stated age, doesn't appear chronically ill PSYCH: Cooperative, without pressured speech EYE: Conjunctivae pink, sclerae anicteric ENT: MMM CV: Nontachycardic RESP: CTAB posteriorly, without wheezing GI: NABS, soft, protuberant abdomen, minimal tenderness to palpation, no rebound or guarding MSK/EXT: Trace bilateral lower extremity edema SKIN: No jaundice NEURO:  Alert & Oriented x 3, no focal deficits, no evidence of asterixis   REVIEW OF DATA  I reviewed the following data at the time of this encounter:  GI Procedures and Studies  April 2021 EGD - Normal esophagus. -Portal hypertensive gastropathy. Scar and inflammation in the antrum status post biopsy. Retained gastric contents made the exam more difficult. -Duodenal nodule - status post biopsy Pathology FINAL MICROSCOPIC DIAGNOSIS:  A. DUODENUM, BIOPSY:  - Erosion with pyloric metaplasia.  - Differential includes peptic injury.  - No features of sprue or carcinoma.  B. STOMACH, BIOPSY:  - Erosion with reactive changes and hyperemia.  - Differential includes portal gastropathy.  - Warthin-Starry negative for Helicobacter pylori.    - No intestinal metaplasia, dysplasia or carcinoma.   Laboratory Studies  Reviewed those in epic  Imaging Studies  November 2020 Right upper quadrant ultrasound IMPRESSION: Cirrhosis.  No definite focal lesion.   ASSESSMENT  Ms. Cortopassi is a 69 y.o. female with a pmh significant for cirrhosis (secondary to previous HCV status post eradication and alcohol use with portal hypertensive gastropathy and thrombocytopenia), arthritis, MDD/anxiety/PTSD, seizure disorder, status post cholecystectomy, chronic abdominal pain, GERD.  The patient is seen today for evaluation and management of:  1. Duodenal nodule   2. Generalized abdominal pain   3. Cirrhosis of liver without ascites, unspecified hepatic cirrhosis type [facility])    The patient is hemodynamically and clinically stable.  She has a duodenal nodule that endoscopically appears to be either an adenoma or potentially a submucosal lesion.  Worthwhile for [place] to consider an endoscopic ultrasound and potential endoscopic resection.  Normally, there would be no question about resecting this particular area if it was an adenoma but as it has returned as peptic duodenitis, we need to be a little bit more vigilant due to her underlying thrombocytopenia and underlying liver disease as to the potential risks of resection of the lesion.  The risks of EUS including bleeding, infection, aspiration pneumonia and intestinal perforation were discussed as was the possibility it may not give a definitive diagnosis.  If a biopsy of the pancreas is done as part of the EUS, there is an additional risk of pancreatitis at the rate of about 1%.  It was explained that procedure related pancreatitis is typically mild, although can be severe and even life threatening, which is why we do not perform random pancreatic biopsies and only biopsy a lesion we feel is concerning enough to warrant the risk.  I do  not expect to find a pancreatic lesion but we will be prepared if necessary.   Based upon the description and endoscopic pictures and when I perform my endoscopy, I will decide about whether I will repeat biopsies versus proceed with an endoscopic advanced resection.  We discussed some of the techniques of advanced polypectomy which include Endoscopic Mucosal Resection, OVESCO Full-Thickness Resection, Endorotor Morcellation, and Tissue Ablation via Fulguration.  The risks and benefits of endoscopic evaluation were discussed with the patient; these include but are not limited to the risk of perforation, infection, bleeding, missed lesions, lack of diagnosis, severe illness requiring hospitalization, as well as anesthesia and sedation related illnesses.  During attempts at advanced resection, the risks of bleeding and perforation/leak are increased as opposed to diagnostic and screening procedures, and that was discussed with the patient as well.   In addition, I explained that if I do proceed with resection and it needs to be done in piecemeal, subsequent short-interval endoscopic evaluation for follow up and potential retreatment of the lesion/area may be necessary.  As this lesion, is not noted at this point to be adenomatous, I did not place a surgical referral.  If, after attempt at removal of the polyp/lesion, if I try, it is found that the patient has a complication or that an invasive lesion or malignant lesion is found, or that the polyp/lesion continues to recur, the patient is aware and understands that surgery may still be indicated/required.  All patient questions were answered, to the best of my ability, and the patient agrees to the aforementioned plan of action with follow-up as indicated.   PLAN  Proceed with scheduling EUS with EMR 90-minute slot -We will decide while there if I repeat biopsies based on EUS findings versus potential resection Preprocedure labs as outlined below   Orders Placed This Encounter  Procedures  . Procedural/ Surgical Case Request:  ENDOSCOPIC MUCOSAL RESECTION, ESOPHAGEAL ENDOSCOPIC ULTRASOUND (EUS) RADIAL  . CBC  . Basic metabolic panel  . Protime-INR  . Ambulatory referral to Gastroenterology    New Prescriptions   No medications on file   Modified Medications   No medications on file    Planned Follow Up No follow-ups on file.   Total Time in Face-to-Face and in Coordination of Care for patient including independent/personal interpretation/review of prior testing, medical history, examination, medication adjustment, communicating results with the patient directly, and documentation with the EHR is 35 minutes.   Justice Britain, MD [facility] Advanced Endoscopy Office # [id number]

## 2019-12-14 NOTE — Progress Notes (Signed)
[place] VISIT   Primary Care Provider Lucia Gaskins, MD [place]  [id number] [phone]  Referring Provider Dr. Gala Romney and NP Gordy Levan  Patient Profile: Sabrina Wyatt is a 69 y.o. female with a pmh significant for cirrhosis (secondary to previous HCV status post eradication and alcohol use with portal hypertensive gastropathy and thrombocytopenia), arthritis, MDD/anxiety/PTSD, seizure disorder, status post cholecystectomy, chronic abdominal pain, GERD.  The patient presents to the [facility] for an evaluation and management of problem(s) noted below:  Problem List 1. Duodenal nodule   2. Generalized abdominal pain   3. Cirrhosis of liver without ascites, unspecified hepatic cirrhosis type ([place])     History of Present Illness This is the patient's first visit to the outpatient [place] clinic.  She is followed by Dr. Gala Romney and NP Gordy Levan for her chronic liver disease attributed to her prior HCV as well as alcohol use.  She has portal hypertension as evidenced by her PhD and thrombocytopenia.  She recently underwent a variceal screening and was found to have a duodenal abnormality/duodenal nodule.  Biopsies of this returned showing evidence of pyloric metaplasia however the imaging endoscopically was concerning for potential underlying adenomatous process.  It is for this reason that the patient is referred to discuss potential endoscopic ultrasound and potential endoscopic resection of this lesion.  The patient describes a significantly difficult cholecystectomy years ago.  She wonders if these changes could be a result of that although it had not been noted on prior endoscopies, which makes that much less likely.  The patient does have chronic abdominal discomfort in multiple areas of her abdomen.  She does not take significant nonsteroidals or BC/Goody powders.  There is no family history of GI malignancies.  She denies  any significant early satiety.  GI Review of Systems Positive as above Negative for dysphagia, odynophagia, change in bowel habits, melena, hematochezia  Review of Systems General: Denies fevers/chills/weight loss Cardiovascular: Denies chest pain/palpitations Pulmonary: Denies shortness of breath/cough Gastroenterological: See HPI Genitourinary: Denies darkened urine or hematuria Hematological: Positive for easy bruising/bleeding Dermatological: Denies jaundice currently Psychological: Mood is stable   Medications Current Outpatient Medications  Medication Sig Dispense Refill  . acyclovir (ZOVIRAX) 400 MG tablet Take 1 tablet (400 mg total) by mouth 2 (two) times daily. 180 tablet 3  . albuterol (PROVENTIL) (2.5 MG/3ML) 0.083% nebulizer solution Take 2.5 mg by nebulization 2 (two) times daily as needed for wheezing or shortness of breath.     Marland Kitchen ascorbic acid (VITAMIN C) 250 MG CHEW Chew 250 mg by mouth daily.    Marland Kitchen atenolol (TENORMIN) 25 MG tablet Take 25 mg by mouth every morning.     . brexpiprazole (REXULTI) 1 MG TABS tablet Take 1 mg by mouth daily.    . Cholecalciferol (VITAMIN D) 50 MCG (2000 UT) CAPS Take 10,000 Units by mouth daily.    . DULoxetine (CYMBALTA) 60 MG capsule Take 60 mg by mouth daily.     Marland Kitchen EPIPEN 2-PAK 0.3 MG/0.3ML SOAJ injection Inject 0.3 mg into the skin as needed (allergic reactions).     Marland Kitchen etodolac (LODINE) 400 MG tablet Take 400 mg by mouth daily.    . Flaxseed, Linseed, (FLAXSEED OIL PO) Take 1 capsule by mouth daily.    . fluticasone (FLONASE) 50 MCG/ACT nasal spray Place 1 spray into both nostrils 2 (two) times daily.     Marland Kitchen loratadine (CLARITIN) 10 MG tablet Take 10 mg by mouth daily.     Marland Kitchen  omeprazole (PRILOSEC) 20 MG capsule TAKE ONE CAPSULE BY MOUTH EVERY DAY (Patient taking differently: Take 20 mg by mouth daily. ) 30 capsule 11  . oxyCODONE (OXY IR/ROXICODONE) 5 MG immediate release tablet Take 5 mg by mouth 4 (four) times daily.     .  pravastatin (PRAVACHOL) 40 MG tablet Take 40 mg by mouth daily.    Marland Kitchen PROAIR HFA 108 (90 BASE) MCG/ACT inhaler Inhale 2 puffs into the lungs every 6 (six) hours as needed for wheezing or shortness of breath.     Marland Kitchen tiZANidine (ZANAFLEX) 2 MG tablet Take 4 mg by mouth daily.     No current facility-administered medications for this visit.    Allergies Allergies  Allergen Reactions  . Asa [Aspirin] Nausea Only  . Bee Venom Hives  . Codeine Nausea Only  . Tylenol [Acetaminophen] Nausea And Vomiting and Swelling    Swelling of hands and feet    Histories Past Medical History:  Diagnosis Date  . Abdominal wall pain    chronic  . Anemia   . Arthritis    rheumatoid  . Biliary stone   . Chronic abdominal pain   . Chronic flank pain   . Chronic low back pain   . Cirrhosis, hepatitis C    U/S on 03/09/16= no HCC, pt has been vaccinated for Hep A and Hep B. s/p treatment of HCV with eradication  . Depression   . Gall stones, common bile duct 2002  . GERD (gastroesophageal reflux disease)   . Gonorrhea   . Hypertension   . 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 Intussusception ([place]) 04/2012  . Polysubstance abuse ([place])    HX of  . PTSD (post-traumatic stress disorder)   . Pulmonary nodules   . S/P colonoscopy 2005   Dr Moss Mc polyp removed, otherwise normal  . S/P endoscopy 08/27/10   antral erosions, otherwise normal, due egd 08/2012 to screen for varices  . Seizures ([place]) 1998   with brain bleed x1. None since then and on no meds  . Shortness of breath   . Tobacco abuse    Past Surgical History:  Procedure Laterality Date  . biliary stone removal  2015  . BIOPSY N/A 03/06/2015   Procedure: BIOPSY;  Surgeon: Daneil Dolin, MD;  Location: [facility] ORS;  Service: Endoscopy;  Laterality: N/A;  . BIOPSY  11/22/2019   Procedure: BIOPSY;  Surgeon: Daneil Dolin, MD;  Location: [facility] SU[facility];  Service: Endoscopy;;  . CARPAL TUNNEL RELEASE Bilateral 12/2010  . CATARACT  EXTRACTION W/PHACO Right 01/15/2016   Procedure: CATARACT EXTRACTION PHACO AND INTRAOCULAR LENS PLACEMENT RIGHT EYE CDE=5.88;  Surgeon: Tonny Branch, MD;  Location: [facility];  Service: Ophthalmology;  Laterality: Right;  . CATARACT EXTRACTION W/PHACO Left 02/12/2016   Procedure: CATARACT EXTRACTION PHACO AND INTRAOCULAR LENS PLACEMENT LEFT EYE; CDE:  6.02;  Surgeon: Tonny Branch, MD;  Location: [facility];  Service: Ophthalmology;  Laterality: Left;  . CHOLECYSTECTOMY  2002  . COLONOSCOPY    09/10/2003   diminutive polyp in the rectum cold biopsied/removed/ Normal colon  . COLONOSCOPY WITH PROPOFOL N/A 08/02/2013   [id number] diverticulosis. next TCS 10 years.  . ESOPHAGOGASTRODUODENOSCOPY    09/10/2003   Small hiatal hernia; otherwise normal stomach, normal D1 and D2  . ESOPHAGOGASTRODUODENOSCOPY  08/27/2010   [id number] esophagus.  No varices.  Couple of tiny antral erosions of doubtful clinical significance, otherwise normal stomach, D1 and D2.  . ESOPHAGOGASTRODUODENOSCOPY (EGD) WITH PROPOFOL  N/A 08/02/2013   RMR: Portal gastropathy. No explanation for abdominal pain which I believe is more abdominal wall in origin. She has been seen by Dr. Carlis Abbott  over at [facility]. She is up-to-date on cross sectional imaging. She has a pain management physician in [place]. Repeat EGD for varices in 3 years.  . ESOPHAGOGASTRODUODENOSCOPY (EGD) WITH PROPOFOL N/A 03/06/2015   [id number] gastric mucosac s/p bx  . ESOPHAGOGASTRODUODENOSCOPY (EGD) WITH PROPOFOL N/A 04/18/2017   normal esophagus, portal hypertensive gastropathy, normal duodenum, no specimens collected. Surveillance in Aug 2020  . ESOPHAGOGASTRODUODENOSCOPY (EGD) WITH PROPOFOL N/A 11/22/2019   Procedure: ESOPHAGOGASTRODUODENOSCOPY (EGD) WITH PROPOFOL;  Surgeon: Daneil Dolin, MD;  [facility];  Service: Endoscopy;  Laterality: N/A;  1:45pm-office rescheduled to 4/1 @ 1:00pm  . LUMBAR FUSION    . NOSE SURGERY  1970  . PERCUTANEOUS TRANSHEPATIC  CHOLANGIOGRAHPY AND BILLIARY DRAINAGE  2002  . REPAIR VAGINAL CUFF N/A 10/25/2015   Procedure: REPAIR VAGINAL LACERATION ;  Surgeon: Jonnie Kind, MD;  Location: [facility] ORS;  Service: Gynecology;  Laterality: N/A;  . Roux-en-y-hepatojejunostomy  2003   revision in 2013 for intussusception [facility] Dr. Bailey Mech)  . SPINAL FUSION     C5-C7   Social History   Socioeconomic History  . Marital status: Single    Spouse name: Not on file  . Number of children: 1  . Years of education: Not on file  . Highest education level: Not on file  Occupational History  . Occupation: disabled    Fish farm manager: UNEMPLOYED  Tobacco Use  . Smoking status: Current Every Day Smoker    Years: 33.00    Types: Cigarettes  . Smokeless tobacco: Never Used  . Tobacco comment: 4 cig daily  Substance and Sexual Activity  . Alcohol use: Yes    Alcohol/week: 0.0 standard drinks    Comment: occasionally   . Drug use: No  . Sexual activity: Yes    Partners: Male    Birth control/protection: Post-menopausal, Condom  Other Topics Concern  . Not on file  Social History Narrative  . Not on file   Social Determinants of Health   Financial Resource Strain:   . Difficulty of Paying Living Expenses:   Food Insecurity:   . Worried About Charity fundraiser in the Last Year:   . Arboriculturist in the Last Year:   Transportation Needs:   . Film/video editor (Medical):   Marland Kitchen Lack of Transportation (Non-Medical):   Physical Activity:   . Days of Exercise per Week:   . Minutes of Exercise per Session:   Stress:   . Feeling of Stress :   Social Connections:   . Frequency of Communication with Friends and Family:   . Frequency of Social Gatherings with Friends and Family:   . Attends Religious Services:   . Active Member of Clubs or Organizations:   . Attends Archivist Meetings:   Marland Kitchen Marital Status:   Intimate Partner Violence:   . Fear of Current or Ex-Partner:   . Emotionally Abused:   Marland Kitchen  Physically Abused:   . Sexually Abused:    Family History  Problem Relation Age of Onset  . Coronary artery disease Brother   . Cancer Sister        lymphoma  . Cancer Father        brain  . Colon cancer Neg Hx   . Pancreatic cancer Neg Hx   . Stomach cancer Neg  Hx   . Esophageal cancer Neg Hx   . Inflammatory bowel disease Neg Hx   . Liver disease Neg Hx   . Rectal cancer Neg Hx    I have reviewed her medical, social, and family history in detail and updated the electronic medical record as necessary.    PHYSICAL EXAMINATION  BP 124/72   Pulse 91   Temp 98.9 F (37.2 C)   Ht 5\' 5"  (1.651 m)   Wt 174 lb (78.9 kg)   SpO2 95%   BMI 28.96 kg/m  Wt Readings from Last 3 Encounters:  12/12/19 174 lb (78.9 kg)  11/22/19 175 lb (79.4 kg)  11/20/19 170 lb (77.1 kg)  GEN: NAD, appears stated age, doesn't appear chronically ill PSYCH: Cooperative, without pressured speech EYE: Conjunctivae pink, sclerae anicteric ENT: MMM CV: Nontachycardic RESP: CTAB posteriorly, without wheezing GI: NABS, soft, protuberant abdomen, minimal tenderness to palpation, no rebound or guarding MSK/EXT: Trace bilateral lower extremity edema SKIN: No jaundice NEURO:  Alert & Oriented x 3, no focal deficits, no evidence of asterixis   REVIEW OF DATA  I reviewed the following data at the time of this encounter:  GI Procedures and Studies  April 2021 EGD - Normal esophagus. -Portal hypertensive gastropathy. Scar and inflammation in the antrum status post biopsy. Retained gastric contents made the exam more difficult. -Duodenal nodule - status post biopsy Pathology FINAL MICROSCOPIC DIAGNOSIS:  A. DUODENUM, BIOPSY:  - Erosion with pyloric metaplasia.  - Differential includes peptic injury.  - No features of sprue or carcinoma.  B. STOMACH, BIOPSY:  - Erosion with reactive changes and hyperemia.  - Differential includes portal gastropathy.  - Warthin-Starry negative for Helicobacter pylori.    - No intestinal metaplasia, dysplasia or carcinoma.   Laboratory Studies  Reviewed those in epic  Imaging Studies  November 2020 Right upper quadrant ultrasound IMPRESSION: Cirrhosis.  No definite focal lesion.   ASSESSMENT  Ms. Hembree is a 69 y.o. female with a pmh significant for cirrhosis (secondary to previous HCV status post eradication and alcohol use with portal hypertensive gastropathy and thrombocytopenia), arthritis, MDD/anxiety/PTSD, seizure disorder, status post cholecystectomy, chronic abdominal pain, GERD.  The patient is seen today for evaluation and management of:  1. Duodenal nodule   2. Generalized abdominal pain   3. Cirrhosis of liver without ascites, unspecified hepatic cirrhosis type [facility])    The patient is hemodynamically and clinically stable.  She has a duodenal nodule that endoscopically appears to be either an adenoma or potentially a submucosal lesion.  Worthwhile for [place] to consider an endoscopic ultrasound and potential endoscopic resection.  Normally, there would be no question about resecting this particular area if it was an adenoma but as it has returned as peptic duodenitis, we need to be a little bit more vigilant due to her underlying thrombocytopenia and underlying liver disease as to the potential risks of resection of the lesion.  The risks of EUS including bleeding, infection, aspiration pneumonia and intestinal perforation were discussed as was the possibility it may not give a definitive diagnosis.  If a biopsy of the pancreas is done as part of the EUS, there is an additional risk of pancreatitis at the rate of about 1%.  It was explained that procedure related pancreatitis is typically mild, although can be severe and even life threatening, which is why we do not perform random pancreatic biopsies and only biopsy a lesion we feel is concerning enough to warrant the risk.  I do  not expect to find a pancreatic lesion but we will be prepared if necessary.   Based upon the description and endoscopic pictures and when I perform my endoscopy, I will decide about whether I will repeat biopsies versus proceed with an endoscopic advanced resection.  We discussed some of the techniques of advanced polypectomy which include Endoscopic Mucosal Resection, OVESCO Full-Thickness Resection, Endorotor Morcellation, and Tissue Ablation via Fulguration.  The risks and benefits of endoscopic evaluation were discussed with the patient; these include but are not limited to the risk of perforation, infection, bleeding, missed lesions, lack of diagnosis, severe illness requiring hospitalization, as well as anesthesia and sedation related illnesses.  During attempts at advanced resection, the risks of bleeding and perforation/leak are increased as opposed to diagnostic and screening procedures, and that was discussed with the patient as well.   In addition, I explained that if I do proceed with resection and it needs to be done in piecemeal, subsequent short-interval endoscopic evaluation for follow up and potential retreatment of the lesion/area may be necessary.  As this lesion, is not noted at this point to be adenomatous, I did not place a surgical referral.  If, after attempt at removal of the polyp/lesion, if I try, it is found that the patient has a complication or that an invasive lesion or malignant lesion is found, or that the polyp/lesion continues to recur, the patient is aware and understands that surgery may still be indicated/required.  All patient questions were answered, to the best of my ability, and the patient agrees to the aforementioned plan of action with follow-up as indicated.   PLAN  Proceed with scheduling EUS with EMR 90-minute slot -We will decide while there if I repeat biopsies based on EUS findings versus potential resection Preprocedure labs as outlined below   Orders Placed This Encounter  Procedures  . Procedural/ Surgical Case Request:  ENDOSCOPIC MUCOSAL RESECTION, ESOPHAGEAL ENDOSCOPIC ULTRASOUND (EUS) RADIAL  . CBC  . Basic metabolic panel  . Protime-INR  . Ambulatory referral to Gastroenterology    New Prescriptions   No medications on file   Modified Medications   No medications on file    Planned Follow Up No follow-ups on file.   Total Time in Face-to-Face and in Coordination of Care for patient including independent/personal interpretation/review of prior testing, medical history, examination, medication adjustment, communicating results with the patient directly, and documentation with the EHR is 35 minutes.   Justice Britain, MD [facility] Office # [phone]

## 2019-12-25 ENCOUNTER — Ambulatory Visit: Payer: Medicare Other | Attending: Internal Medicine

## 2019-12-25 DIAGNOSIS — Z23 Encounter for immunization: Secondary | ICD-10-CM

## 2019-12-25 NOTE — Progress Notes (Signed)
   Covid-19 Vaccination Clinic  Name:  ROSHEENA OHORA    MRN: [id number] DOB: February 17, 1951  12/25/2019  Ms. Fash was observed post Covid-19 immunization for 15 minutes without incident. She was provided with Vaccine Information Sheet and instruction to access the V-Safe system.   Ms. Delzell was instructed to call 911 with any severe reactions post vaccine: Marland Kitchen Difficulty breathing  . Swelling of face and throat  . A fast heartbeat  . A bad rash all over body  . Dizziness and weakness   Immunizations Administered    Name Date Dose VIS Date Route   Moderna COVID-19 Vaccine 12/25/2019  8:53 AM 0.5 mL 07/2019 Intramuscular   Manufacturer: Moderna   Lot: [id number]   [id number]

## 2019-12-31 ENCOUNTER — Other Ambulatory Visit (INDEPENDENT_AMBULATORY_CARE_PROVIDER_SITE_OTHER): Payer: Medicare Other

## 2019-12-31 ENCOUNTER — Other Ambulatory Visit: Payer: Medicare Other

## 2019-12-31 DIAGNOSIS — K7460 Unspecified cirrhosis of liver: Secondary | ICD-10-CM

## 2019-12-31 LAB — PROTIME-INR
INR: 1.1 ratio — ABNORMAL HIGH (ref 0.8–1.0)
Prothrombin Time: 11.8 s (ref 9.6–13.1)

## 2020-01-02 ENCOUNTER — Encounter: Payer: Self-pay | Admitting: Nurse Practitioner

## 2020-01-02 ENCOUNTER — Other Ambulatory Visit: Payer: Self-pay

## 2020-01-02 ENCOUNTER — Encounter: Payer: Self-pay | Admitting: Internal Medicine

## 2020-01-02 ENCOUNTER — Ambulatory Visit (INDEPENDENT_AMBULATORY_CARE_PROVIDER_SITE_OTHER): Payer: Medicare Other | Admitting: Nurse Practitioner

## 2020-01-02 VITALS — BP 116/75 | HR 83 | Temp 97.0°F | Ht 65.0 in | Wt 175.0 lb

## 2020-01-02 DIAGNOSIS — K3189 Other diseases of stomach and duodenum: Secondary | ICD-10-CM | POA: Diagnosis not present

## 2020-01-02 DIAGNOSIS — F1010 Alcohol abuse, uncomplicated: Secondary | ICD-10-CM

## 2020-01-02 DIAGNOSIS — K7460 Unspecified cirrhosis of liver: Secondary | ICD-10-CM

## 2020-01-02 DIAGNOSIS — B182 Chronic viral hepatitis C: Secondary | ICD-10-CM | POA: Diagnosis not present

## 2020-01-02 NOTE — Assessment & Plan Note (Signed)
Chronic cirrhosis due to previous chronic hepatitis C infection status post treatment and SVR as well as alcohol abuse.  Her liver disease historically has been well compensated.  She continues to drink occasionally, per her admission.  Variceal screening up-to-date with no esophageal varices.  Hepatic encephalopathy/anasarca at this point she is due for updated labs and imaging.  I will check a CBC, CMP, INR, if PE as well as right upper quadrant ultrasound for hepatoma screening in conjunction with serum AFP.  Further recommendations to follow.  Follow-up in 6 months.

## 2020-01-02 NOTE — Assessment & Plan Note (Signed)
Duodenal nodule noted on previous EGD.  Has an appointment in agreement early for EUS with possible resection.  This is scheduled for next week, she is aware of appointment and intends to keep it.  Further recommendations to follow.  Follow-up in our office in 6 months.

## 2020-01-02 NOTE — Patient Instructions (Addendum)
Your health issues we discussed today were:   Cirrhosis: 1. As we discussed, your liver disease appears to be functioning just fine 2. Have your labs checked soon as you can and we will call you with the results 3. We will help schedule ultrasound for you 4. As we discussed, you should avoid alcohol completely to prevent further liver damage and worsening liver disease 5. Call us for any concerning symptoms  Duodenal nodule: 1. Keep your appointment in [place] for your endoscopic ultrasound to evaluate the nodule in your small intestines.   2. Further recommendations will follow their procedure  Overall I recommend:  1. Continue your other current medications 2. Return for follow-up in 6 months 3. Call us if you have any questions or concerns   ---------------------------------------------------------------  I am glad you have gotten your COVID-19 vaccination!  Even though you are fully vaccinated you should continue to wear a mask, socially distance, and wash your hands frequently  ---------------------------------------------------------------   At [facility] we value your feedback. You may receive a survey about your visit today. Please share your experience as we strive to create trusting relationships with our patients to provide genuine, compassionate, quality care.  We appreciate your understanding and patience as we review any laboratory studies, imaging, and other diagnostic tests that are ordered as we care for you. Our office policy is 5 business days for review of these results, and any emergent or urgent results are addressed in a timely manner for your best interest. If you do not hear from our office in 1 week, please contact us.   We also encourage the use of MyChart, which contains your medical information for your review as well. If you are not enrolled in this feature, an access code is on this after visit summary for your convenience. Thank you  for allowing [place] to be involved in your care.  It was great to see you today!  I hope you have a great Summer!!

## 2020-01-02 NOTE — Assessment & Plan Note (Signed)
History of alcohol abuse.  States her last alcohol was a few days ago when she had 2 glasses of wine had a historic use of alcohol as per HPI.  Again recommended complete alcohol cessation to prevent further liver damage and decompensation.  She verbalized understanding.  Follow-up in 6 months.

## 2020-01-02 NOTE — Progress Notes (Signed)
Referring Provider: Lucia Gaskins, MD Primary Care Physician:  Lucia Gaskins, MD Primary GI:  Dr. Gala Romney  Chief Complaint  Patient presents with  . Cirrhosis    f/u. scheduled for egd 5/19.     HPI:   Sabrina Wyatt is a 69 y.o. female who presents for follow-up on cirrhosis and portal hypertension.  The patient was last seen in our office 07/05/2019 for the same.  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  States she only drinks alcohol "once in a blue moon" although she was in the hospital 02/26/2019 for alcohol intoxication.  Since then she has had emergency room visits both in November 2019 and March 2020 for alcohol intoxication including an ethanol level of 272 on 07/15/2018.  We have stressed the need to abstain from alcohol previously.  EGD for variceal screening up-to-date previously 04/18/2017 and was due for repeat in 2020.  At her last visit she noted doing well overall, denies overt hepatic and GI symptoms.  Her last alcohol was 3 days ago when she drank 2 glasses of wine at her daughter's birthday.  Recommended updated labs and imaging, updated EGD for variceal screening.  Follow-up in 6 months.  Labs are completed 07/05/2019 which found normal CBC (platelet count 141), normal CMP, normal INR, normal AFP. MELD 7, Child-Pugh: A.   Right upper quadrant ultrasound completed 07/16/2019 found known cirrhosis without definite focal lesion.  EGD completed 11/22/2019 which found normal esophagus, portal hypertensive gastropathy with scar and inflammation in the antrum status post biopsy and retained gastric contents.  Also noted duodenal nodule status post biopsy.  Surgical pathology found gastric biopsies to be erosion with reactive changes and hyperemia with differentials including portal gastropathy, negative for H. pylori.  The duodenal biopsy found erosion with pyloric metaplasia with no features of sprue or carcinoma.  Recommended further  evaluation of the duodenal nodule by endoscopic ultrasound and she was referred to Dr. Justice Britain at [facility] in [place].  Reviewed office visit by Dr. Rush Landmark 12/12/2019 for duodenal nodule.  Recommended proceed with scheduling of the EUS with possible resection pending endoscopic findings.  This is currently scheduled for 01/09/2020 (next week).  Today she states she is doing okay overall.  She knows she is scheduled for EGD/EUS on 5/19. Denies abdominal pain. She does have lower abdominal pain, sees PCP on 5/27. Denies N/V, hematochezia, melena, fever, chills, unintentional weight loss. Denies yellowing of skin/eyes, darkened urine, acute episodic confusion, generalized pruritis, tremors/shakes. Denies URI or flu-like symptoms. Denies loss of sense of taste or smell. The patient has received COVID-19 vaccination(s). Denies chest pain, dyspnea, dizziness, lightheadedness, syncope, near syncope. Denies any other upper or lower GI symptoms.  Past Medical History:  Diagnosis Date  . Abdominal wall pain    chronic  . Anemia   . Arthritis    rheumatoid  . Biliary stone   . Chronic abdominal pain   . Chronic flank pain   . Chronic low back pain   . Cirrhosis, hepatitis C    U/S on 03/09/16= no HCC, pt has been vaccinated for Hep A and Hep B. s/p treatment of HCV with eradication  . Depression   . Gall stones, common bile duct 2002  . GERD (gastroesophageal reflux disease)   . Gonorrhea   . Hypertension   . 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 Intussusception ([place]) 04/2012  . Polysubstance abuse ([place])    HX of  .  PTSD (post-traumatic stress disorder)   . Pulmonary nodules   . S/P colonoscopy 2005   Dr Moss Mc polyp removed, otherwise normal  . S/P endoscopy 08/27/10   antral erosions, otherwise normal, due egd 08/2012 to screen for varices  . Seizures ([place]) 1998   with brain bleed x1. None since then and on no meds  . Shortness of breath   . Tobacco abuse      Past Surgical History:  Procedure Laterality Date  . biliary stone removal  2015  . BIOPSY N/A 03/06/2015   Procedure: BIOPSY;  Surgeon: Daneil Dolin, MD;  Location: [facility];  Service: Endoscopy;  Laterality: N/A;  . BIOPSY  11/22/2019   Procedure: BIOPSY;  Surgeon: Daneil Dolin, MD;  Location: [facility];  Service: Endoscopy;;  . CARPAL TUNNEL RELEASE Bilateral 12/2010  . CATARACT EXTRACTION W/PHACO Right 01/15/2016   Procedure: CATARACT EXTRACTION PHACO AND INTRAOCULAR LENS PLACEMENT RIGHT EYE CDE=5.88;  Surgeon: Tonny Branch, MD;  Location: [facility];  Service: Ophthalmology;  Laterality: Right;  . CATARACT EXTRACTION W/PHACO Left 02/12/2016   Procedure: CATARACT EXTRACTION PHACO AND INTRAOCULAR LENS PLACEMENT LEFT EYE; CDE:  6.02;  Surgeon: Tonny Branch, MD;  Location: [facility];  Service: Ophthalmology;  Laterality: Left;  . CHOLECYSTECTOMY  2002  . COLONOSCOPY    09/10/2003   diminutive polyp in the rectum cold biopsied/removed/ Normal colon  . COLONOSCOPY WITH PROPOFOL N/A 08/02/2013   [id number] diverticulosis. next TCS 10 years.  . ESOPHAGOGASTRODUODENOSCOPY    09/10/2003   Small hiatal hernia; otherwise normal stomach, normal D1 and D2  . ESOPHAGOGASTRODUODENOSCOPY  08/27/2010   [id number] esophagus.  No varices.  Couple of tiny antral erosions of doubtful clinical significance, otherwise normal stomach, D1 and D2.  . ESOPHAGOGASTRODUODENOSCOPY (EGD) WITH PROPOFOL N/A 08/02/2013   RMR: Portal gastropathy. No explanation for abdominal pain which I believe is more abdominal wall in origin. She has been seen by Dr. Carlis Abbott  over at [facility]. She is up-to-date on cross sectional imaging. She has a pain management physician in [place]. Repeat EGD for varices in 3 years.  . ESOPHAGOGASTRODUODENOSCOPY (EGD) WITH PROPOFOL N/A 03/06/2015   [id number] gastric mucosac s/p bx  . ESOPHAGOGASTRODUODENOSCOPY (EGD) WITH PROPOFOL N/A 04/18/2017   normal esophagus, portal hypertensive gastropathy,  normal duodenum, no specimens collected. Surveillance in Aug 2020  . ESOPHAGOGASTRODUODENOSCOPY (EGD) WITH PROPOFOL N/A 11/22/2019   Procedure: ESOPHAGOGASTRODUODENOSCOPY (EGD) WITH PROPOFOL;  Surgeon: Daneil Dolin, MD;  [facility];  Service: Endoscopy;  Laterality: N/A;  1:45pm-office rescheduled to 4/1 @ 1:00pm  . LUMBAR FUSION    . NOSE SURGERY  1970  . PERCUTANEOUS TRANSHEPATIC CHOLANGIOGRAHPY AND BILLIARY DRAINAGE  2002  . REPAIR VAGINAL CUFF N/A 10/25/2015   Procedure: REPAIR VAGINAL LACERATION ;  Surgeon: Jonnie Kind, MD;  Location: [facility];  Service: Gynecology;  Laterality: N/A;  . Roux-en-y-hepatojejunostomy  2003   revision in 2013 for intussusception [facility] Dr. Bailey Mech)  . SPINAL FUSION     C5-C7    Current Outpatient Medications  Medication Sig Dispense Refill  . acyclovir (ZOVIRAX) 400 MG tablet Take 1 tablet (400 mg total) by mouth 2 (two) times daily. 180 tablet 3  . albuterol (PROVENTIL) (2.5 MG/3ML) 0.083% nebulizer solution Take 2.5 mg by nebulization 2 (two) times daily as needed for wheezing or shortness of breath.     Marland Kitchen ascorbic acid (VITAMIN C) 250 MG CHEW Chew 250 mg by mouth daily.    Marland Kitchen atenolol (TENORMIN) 25  MG tablet Take 25 mg by mouth every morning.     . brexpiprazole (REXULTI) 1 MG TABS tablet Take 1 mg by mouth daily.    . Cholecalciferol (VITAMIN D) 50 MCG (2000 UT) CAPS Take 10,000 Units by mouth daily.    . DULoxetine (CYMBALTA) 60 MG capsule Take 60 mg by mouth daily.     Marland Kitchen EPIPEN 2-PAK 0.3 MG/0.3ML SOAJ injection Inject 0.3 mg into the skin as needed (allergic reactions).     Marland Kitchen etodolac (LODINE) 400 MG tablet Take 400 mg by mouth daily.    . Flaxseed, Linseed, (FLAXSEED OIL PO) Take 1 capsule by mouth daily.    . fluticasone (FLONASE) 50 MCG/ACT nasal spray Place 1 spray into both nostrils 2 (two) times daily.     Marland Kitchen gabapentin (NEURONTIN) 300 MG capsule Take 300 mg by mouth 3 (three) times daily.    Marland Kitchen loratadine (CLARITIN) 10 MG  tablet Take 10 mg by mouth daily.     Marland Kitchen omeprazole (PRILOSEC) 20 MG capsule TAKE ONE CAPSULE BY MOUTH EVERY DAY (Patient taking differently: Take 20 mg by mouth daily. ) 30 capsule 11  . oxyCODONE (OXY IR/ROXICODONE) 5 MG immediate release tablet Take 5 mg by mouth 4 (four) times daily.     . pravastatin (PRAVACHOL) 40 MG tablet Take 40 mg by mouth daily.    Marland Kitchen PROAIR HFA 108 (90 BASE) MCG/ACT inhaler Inhale 2 puffs into the lungs every 6 (six) hours as needed for wheezing or shortness of breath.     Marland Kitchen tiZANidine (ZANAFLEX) 2 MG tablet Take 4 mg by mouth daily.     No current facility-administered medications for this visit.    Allergies as of 01/02/2020 - Review Complete 01/02/2020  Allergen Reaction Noted  . Asa [aspirin] Nausea Only 04/13/2016  . Bee venom Hives 07/10/2015  . Codeine Nausea Only 02/18/2017  . Tylenol [acetaminophen] Nausea And Vomiting and Swelling 07/10/2015    Family History  Problem Relation Age of Onset  . Coronary artery disease Brother   . Cancer Sister        lymphoma  . Cancer Father        brain  . Colon cancer Neg Hx   . Pancreatic cancer Neg Hx   . Stomach cancer Neg Hx   . Esophageal cancer Neg Hx   . Inflammatory bowel disease Neg Hx   . Liver disease Neg Hx   . Rectal cancer Neg Hx     Social History   Socioeconomic History  . Marital status: Single    Spouse name: Not on file  . Number of children: 1  . Years of education: Not on file  . Highest education level: Not on file  Occupational History  . Occupation: disabled    Fish farm manager: UNEMPLOYED  Tobacco Use  . Smoking status: Current Every Day Smoker    Packs/day: 0.25    Years: 33.00    Pack years: 8.25    Types: Cigarettes  . Smokeless tobacco: Never Used  . Tobacco comment: 4 cig daily  Substance and Sexual Activity  . Alcohol use: Not Currently    Alcohol/week: 0.0 standard drinks    Comment: As of 01/02/20: Last ETOH 3 days ago (2 glasses of wine)  . Drug use: No  . Sexual  activity: Yes    Partners: Male    Birth control/protection: Post-menopausal, Condom  Other Topics Concern  . Not on file  Social History Narrative  . Not on file  Social Determinants of Health   Financial Resource Strain:   . Difficulty of Paying Living Expenses:   Food Insecurity:   . Worried About Charity fundraiser in the Last Year:   . Arboriculturist in the Last Year:   Transportation Needs:   . Film/video editor (Medical):   Marland Kitchen Lack of Transportation (Non-Medical):   Physical Activity:   . Days of Exercise per Week:   . Minutes of Exercise per Session:   Stress:   . Feeling of Stress :   Social Connections:   . Frequency of Communication with Friends and Family:   . Frequency of Social Gatherings with Friends and Family:   . Attends Religious Services:   . Active Member of Clubs or Organizations:   . Attends Archivist Meetings:   Marland Kitchen Marital Status:     Subjective: Review of Systems  Constitutional: Negative for chills, fever, malaise/fatigue and weight loss.  HENT: Negative for congestion and sore throat.   Respiratory: Negative for cough and shortness of breath.   Cardiovascular: Negative for chest pain, palpitations and leg swelling.  Gastrointestinal: Negative for abdominal pain, blood in stool, diarrhea, melena, nausea and vomiting.  Musculoskeletal: Negative for joint pain and myalgias.  Skin: Negative for rash.  Neurological: Negative for dizziness and weakness.  Endo/Heme/Allergies: Does not bruise/bleed easily.  Psychiatric/Behavioral: Negative for depression. The patient is not nervous/anxious.   All other systems reviewed and are negative.    Objective: BP 116/75   Pulse 83   Temp (!) 97 F (36.1 C)   Ht 5\' 5"  (1.651 m)   Wt 175 lb (79.4 kg)   BMI 29.12 kg/m  Physical Exam Vitals and nursing note reviewed.  Constitutional:      General: She is not in acute distress.    Appearance: Normal appearance. She is well-developed  and normal weight. She is not ill-appearing, toxic-appearing or diaphoretic.  HENT:     Head: Normocephalic and atraumatic.     Nose: No congestion or rhinorrhea.  Eyes:     General: No scleral icterus. Cardiovascular:     Rate and Rhythm: Normal rate and regular rhythm.     Heart sounds: Normal heart sounds.  Pulmonary:     Effort: Pulmonary effort is normal. No respiratory distress.     Breath sounds: Examination of the right-lower field reveals wheezing. Examination of the left-lower field reveals wheezing. Wheezing present.     Comments: Mild bilateral end-expiratory wheezes Abdominal:     General: Bowel sounds are normal.     Palpations: Abdomen is soft. There is no fluid wave, hepatomegaly, splenomegaly or mass.     Tenderness: There is no abdominal tenderness. There is no guarding or rebound.     Hernia: No hernia is present.  Skin:    General: Skin is warm and dry.     Coloration: Skin is not jaundiced.     Findings: No bruising or rash.  Neurological:     General: No focal deficit present.     Mental Status: She is alert and oriented to person, place, and time. Mental status is at baseline.     Motor: No tremor.  Psychiatric:        Attention and Perception: Attention normal.        Mood and Affect: Mood normal.        Speech: Speech normal.        Behavior: Behavior normal.  Thought Content: Thought content normal.        Cognition and Memory: Cognition and memory normal.       01/02/2020 10:53 AM   Disclaimer: This note was dictated with voice recognition software. Similar sounding words can inadvertently be transcribed and may not be corrected upon review.

## 2020-01-04 ENCOUNTER — Other Ambulatory Visit (HOSPITAL_COMMUNITY)
Admission: RE | Admit: 2020-01-04 | Discharge: 2020-01-04 | Disposition: A | Discharge status: Home / Self Care | Payer: Medicare Other | Source: Ambulatory Visit | Attending: Gastroenterology | Admitting: Gastroenterology

## 2020-01-04 DIAGNOSIS — Z20822 Contact with and (suspected) exposure to covid-19: Secondary | ICD-10-CM | POA: Diagnosis not present

## 2020-01-04 DIAGNOSIS — Z01812 Encounter for preprocedural laboratory examination: Secondary | ICD-10-CM | POA: Insufficient documentation

## 2020-01-04 LAB — SARS CORONAVIRUS 2 (TAT 6-24 HRS): SARS Coronavirus 2: NEGATIVE

## 2020-01-08 ENCOUNTER — Encounter (HOSPITAL_COMMUNITY): Payer: Self-pay | Admitting: Gastroenterology

## 2020-01-08 ENCOUNTER — Other Ambulatory Visit: Payer: Self-pay

## 2020-01-08 NOTE — Anesthesia Preprocedure Evaluation (Addendum)
Anesthesia Evaluation  Patient identified by MRN, date of birth, ID band Patient awake    Reviewed: Allergy & Precautions, H&P , NPO status , Patient's Chart, lab work & pertinent test results  Airway Mallampati: III  TM Distance: >3 FB Neck ROM: Full    Dental no notable dental hx. (+) Edentulous Upper, Edentulous Lower, Dental Advisory Given   Pulmonary Current SmokerPatient did not abstain from smoking.,    Pulmonary exam normal breath sounds clear to auscultation       Cardiovascular Exercise Tolerance: Good hypertension, Pt. on medications and Pt. on home beta blockers  Rhythm:Regular Rate:Normal     Neuro/Psych Anxiety Depression negative neurological ROS     GI/Hepatic Neg liver ROS, GERD  Medicated,  Endo/Other  negative endocrine ROS  Renal/GU negative Renal ROS  negative genitourinary   Musculoskeletal  (+) Arthritis , Rheumatoid disorders,    Abdominal   Peds  Hematology  (+) Blood dyscrasia, anemia ,   Anesthesia Other Findings   Reproductive/Obstetrics negative OB ROS                            Anesthesia Physical Anesthesia Plan  ASA: II  Anesthesia Plan: MAC   Post-op Pain Management:    Induction: Intravenous  PONV Risk Score and Plan: 1 and Propofol infusion and Treatment may vary due to age or medical condition  Airway Management Planned: Nasal Cannula  Additional Equipment:   Intra-op Plan:   Post-operative Plan:   Informed Consent: I have reviewed the patients History and Physical, chart, labs and discussed the procedure including the risks, benefits and alternatives for the proposed anesthesia with the patient or authorized representative who has indicated his/her understanding and acceptance.     Dental advisory given  Plan Discussed with: CRNA  Anesthesia Plan Comments:         Anesthesia Quick Evaluation

## 2020-01-08 NOTE — Progress Notes (Signed)
Pt denies SOB, chest pain, and being under the care of a cardiologist. Pt stated that PCP is Dr. Cindie Laroche. Pt made aware to stop taking  Aspirin (unless otherwise advised by surgeon), vitamins, fish oil, Flaxseed and herbal medications. Do not take any NSAIDs ie: Ibuprofen, Advil, Naproxen (Aleve), Motrin, Lodine BC and Goody Powder. Pt reminded to quarantine. Pt verbalized understanding of all pre-op instructions.

## 2020-01-09 ENCOUNTER — Ambulatory Visit (HOSPITAL_COMMUNITY): Payer: Medicare Other | Admitting: Anesthesiology

## 2020-01-09 ENCOUNTER — Encounter (HOSPITAL_COMMUNITY): Payer: Self-pay | Admitting: Gastroenterology

## 2020-01-09 ENCOUNTER — Ambulatory Visit (HOSPITAL_COMMUNITY)
Admission: RE | Admit: 2020-01-09 | Discharge: 2020-01-09 | Disposition: A | Discharge status: Home / Self Care | Payer: Medicare Other | Source: Ambulatory Visit | Attending: Gastroenterology | Admitting: Gastroenterology

## 2020-01-09 ENCOUNTER — Encounter (HOSPITAL_COMMUNITY)
Admission: RE | Disposition: A | Discharge status: Home / Self Care | Payer: Self-pay | Source: Ambulatory Visit | Attending: Gastroenterology

## 2020-01-09 ENCOUNTER — Other Ambulatory Visit: Payer: Self-pay | Admitting: Physician Assistant

## 2020-01-09 DIAGNOSIS — G40909 Epilepsy, unspecified, not intractable, without status epilepticus: Secondary | ICD-10-CM | POA: Insufficient documentation

## 2020-01-09 DIAGNOSIS — K3189 Other diseases of stomach and duodenum: Secondary | ICD-10-CM | POA: Diagnosis not present

## 2020-01-09 DIAGNOSIS — R1084 Generalized abdominal pain: Secondary | ICD-10-CM | POA: Diagnosis not present

## 2020-01-09 DIAGNOSIS — F419 Anxiety disorder, unspecified: Secondary | ICD-10-CM | POA: Insufficient documentation

## 2020-01-09 DIAGNOSIS — Z79899 Other long term (current) drug therapy: Secondary | ICD-10-CM | POA: Insufficient documentation

## 2020-01-09 DIAGNOSIS — M1990 Unspecified osteoarthritis, unspecified site: Secondary | ICD-10-CM | POA: Insufficient documentation

## 2020-01-09 DIAGNOSIS — F17210 Nicotine dependence, cigarettes, uncomplicated: Secondary | ICD-10-CM | POA: Insufficient documentation

## 2020-01-09 DIAGNOSIS — K2950 Unspecified chronic gastritis without bleeding: Secondary | ICD-10-CM | POA: Insufficient documentation

## 2020-01-09 DIAGNOSIS — K219 Gastro-esophageal reflux disease without esophagitis: Secondary | ICD-10-CM | POA: Insufficient documentation

## 2020-01-09 DIAGNOSIS — M069 Rheumatoid arthritis, unspecified: Secondary | ICD-10-CM | POA: Diagnosis not present

## 2020-01-09 DIAGNOSIS — Z9049 Acquired absence of other specified parts of digestive tract: Secondary | ICD-10-CM | POA: Insufficient documentation

## 2020-01-09 DIAGNOSIS — D696 Thrombocytopenia, unspecified: Secondary | ICD-10-CM | POA: Diagnosis not present

## 2020-01-09 DIAGNOSIS — Z981 Arthrodesis status: Secondary | ICD-10-CM | POA: Insufficient documentation

## 2020-01-09 DIAGNOSIS — I10 Essential (primary) hypertension: Secondary | ICD-10-CM | POA: Diagnosis not present

## 2020-01-09 DIAGNOSIS — F329 Major depressive disorder, single episode, unspecified: Secondary | ICD-10-CM | POA: Diagnosis not present

## 2020-01-09 DIAGNOSIS — K2970 Gastritis, unspecified, without bleeding: Secondary | ICD-10-CM | POA: Diagnosis not present

## 2020-01-09 DIAGNOSIS — K766 Portal hypertension: Secondary | ICD-10-CM | POA: Diagnosis not present

## 2020-01-09 DIAGNOSIS — K7460 Unspecified cirrhosis of liver: Secondary | ICD-10-CM | POA: Insufficient documentation

## 2020-01-09 DIAGNOSIS — B1920 Unspecified viral hepatitis C without hepatic coma: Secondary | ICD-10-CM | POA: Insufficient documentation

## 2020-01-09 HISTORY — PX: EUS: SHX5427

## 2020-01-09 HISTORY — PX: ESOPHAGOGASTRODUODENOSCOPY (EGD) WITH PROPOFOL: SHX5813

## 2020-01-09 HISTORY — DX: Polyp of stomach and duodenum: K31.7

## 2020-01-09 HISTORY — PX: BIOPSY: SHX5522

## 2020-01-09 SURGERY — ESOPHAGOGASTRODUODENOSCOPY (EGD) WITH PROPOFOL
Anesthesia: Monitor Anesthesia Care

## 2020-01-09 MED ORDER — SODIUM CHLORIDE 0.9 % IV SOLN
INTRAVENOUS | Status: DC | PRN
Start: 2020-01-09 — End: 2020-01-09

## 2020-01-09 MED ORDER — LIDOCAINE 2% (20 MG/ML) 5 ML SYRINGE
INTRAMUSCULAR | Status: DC | PRN
  Administered 2020-01-09: 40 mg via INTRAVENOUS

## 2020-01-09 MED ORDER — LACTATED RINGERS IV SOLN: INTRAVENOUS | Status: DC

## 2020-01-09 MED ORDER — PHENYLEPHRINE 40 MCG/ML (10ML) SYRINGE FOR IV PUSH (FOR BLOOD PRESSURE SUPPORT)
PREFILLED_SYRINGE | INTRAVENOUS | Status: DC | PRN
  Administered 2020-01-09 (×2): 80 ug via INTRAVENOUS

## 2020-01-09 MED ORDER — SODIUM CHLORIDE 0.9 % IV SOLN: INTRAVENOUS | Status: DC

## 2020-01-09 MED ORDER — OMEPRAZOLE 20 MG PO CPDR: 40.0000 mg | DELAYED_RELEASE_CAPSULE | Freq: Two times a day (BID) | ORAL | 4 refills | Status: DC

## 2020-01-09 MED ORDER — LACTATED RINGERS IV SOLN: INTRAVENOUS | Status: DC | PRN

## 2020-01-09 MED ORDER — PROPOFOL 500 MG/50ML IV EMUL
INTRAVENOUS | Status: DC | PRN
  Administered 2020-01-09: 100 ug/kg/min via INTRAVENOUS

## 2020-01-09 SURGICAL SUPPLY — 15 items

## 2020-01-09 NOTE — Discharge Instructions (Signed)
YOU HAD AN ENDOSCOPIC PROCEDURE TODAY: Refer to the procedure report and other information in the discharge instructions given to you for any specific questions about what was found during the examination. If this information does not answer your questions, please call [facility] office at [phone] to clarify.   YOU SHOULD EXPECT: Some feelings of bloating in the abdomen. Passage of more gas than usual. Walking can help get rid of the air that was put into your GI tract during the procedure and reduce the bloating. If you had a lower endoscopy (such as a colonoscopy or flexible sigmoidoscopy) you may notice spotting of blood in your stool or on the toilet paper. Some abdominal soreness may be present for a day or two, also.  DIET: Your first meal following the procedure should be a light meal and then it is ok to progress to your normal diet. A half-sandwich or bowl of soup is an example of a good first meal. Heavy or fried foods are harder to digest and may make you feel nauseous or bloated. Drink plenty of fluids but you should avoid alcoholic beverages for 24 hours. If you had a esophageal dilation, please see attached instructions for diet.    ACTIVITY: Your care partner should take you home directly after the procedure. You should plan to take it easy, moving slowly for the rest of the day. You can resume normal activity the day after the procedure however YOU SHOULD NOT DRIVE, use power tools, machinery or perform tasks that involve climbing or major physical exertion for 24 hours (because of the sedation medicines used during the test).   SYMPTOMS TO REPORT IMMEDIATELY: A gastroenterologist can be reached at any hour. Please call [phone]  for any of the following symptoms:   Following upper endoscopy (EGD, EUS, ERCP, esophageal dilation) Vomiting of blood or coffee ground material  New, significant abdominal pain  New, significant chest pain or pain under the shoulder blades  Painful or  persistently difficult swallowing  New shortness of breath  Black, tarry-looking or red, bloody stools  FOLLOW UP:  If any biopsies were taken you will be contacted by phone or by letter within the next 1-3 weeks. Call [phone]  if you have not heard about the biopsies in 3 weeks.  Please also call with any specific questions about appointments or follow up tests.  

## 2020-01-09 NOTE — Anesthesia Postprocedure Evaluation (Signed)
Anesthesia Post Note  Patient: KENZLY ROGOFF  Procedure(s) Performed: ESOPHAGOGASTRODUODENOSCOPY (EGD) WITH PROPOFOL (N/A ) ENDOSCOPIC MUCOSAL RESECTION (N/A ) ESOPHAGEAL ENDOSCOPIC ULTRASOUND (EUS) RADIAL (N/A ) BIOPSY     Patient location during evaluation: Endoscopy Anesthesia Type: MAC Level of consciousness: awake and alert Pain management: pain level controlled Vital Signs Assessment: post-procedure vital signs reviewed and stable Respiratory status: spontaneous breathing, nonlabored ventilation and respiratory function stable Cardiovascular status: stable and blood pressure returned to baseline Postop Assessment: no apparent nausea or vomiting Anesthetic complications: no    Last Vitals:  Vitals:   01/09/20 1230 01/09/20 1240  BP: 128/65 124/69  Pulse: 89 90  Resp: 20 18  Temp:    SpO2: 94% 93%    Last Pain:  Vitals:   01/09/20 1240  TempSrc:   PainSc: 0-No pain                 Sylvestre Rathgeber,W. EDMOND

## 2020-01-09 NOTE — Op Note (Signed)
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 extrinsic compression Providers:                Justice Britain, MD, Carlyn Reichert, RN, Elspeth Cho Tech., Technician, Carver Fila Referring MD:             Norvel Richards, MD Medicines:                Monitored Anesthesia Care Complications:            No immediate complications. Estimated Blood Loss:     Estimated blood loss was minimal. Procedure:                Pre-Anesthesia Assessment:                           - Prior to the procedure, a History and Physical                            was performed, and patient medications and                            allergies were reviewed. The patient's tolerance of                            previous anesthesia was also reviewed. The risks                            and benefits of the procedure and the sedation                            options and risks were discussed with the patient.                            All questions were answered, and informed consent                            was obtained. Prior Anticoagulants: The patient has                            taken no previous anticoagulant or antiplatelet  agents. ASA Grade Assessment: III - A patient with                            severe systemic disease. After reviewing the risks                            and benefits, the patient was deemed in                            satisfactory condition to undergo the procedure.                           After obtaining informed consent, the  endoscope was                            passed under direct vision. Throughout the                            procedure, the patient's blood pressure, pulse, and                            oxygen saturations were monitored continuously. The                            [id number]-[id number] ([id number]) Olympus therapeutic                            gastroscope was introduced through the mouth, and                            advanced to the second part of duodenum. The                            [id number] ([id number]) [place] was                            introduced through the mouth, and advanced to the                            second part of duodenum. The [id number]-[id number] ([id number])                            Olympus Radial EUS scope was introduced through the                            mouth, and advanced to the duodenum for ultrasound                            examination from the stomach and duodenum. The                            upper EUS was accomplished without difficulty. The  patient tolerated the procedure. Scope In: Scope Out: Findings:      ENDOSCOPIC FINDING: :      No gross lesions were noted in the proximal esophagus, in the mid       esophagus and in the distal esophagus.      Mild portal hypertensive gastropathy was found in the cardia, in the       gastric fundus and in the gastric body.      Localized moderate inflammation characterized by erosions, erythema,       friability and granularity was found in the gastric antrum and in the       prepyloric region of the stomach. Biopsies were taken with a cold       forceps for histology.      Nodular mucosa was found in the second portion of the duodenum. On       side-viewing endoscopy, it is not clear if this is the minor complex       ampulla. After the EUS was completed, biopsies were taken with a cold       forceps for histology.      ENDOSONOGRAPHIC FINDING: :      Localized wall thickening  was visualized endosonographically in the       second portion of the duodenum. This appeared to primarily be due to       thickening within the luminal interface/superficial mucosa (Layer 1),       deep mucosa (Layer 2) and submucosa (Layer 3). The thickness of the       abnormal layers measured 7.2 mm. The duodenal wall measured up to 11.5       mm in thickness. It is not clearly involving Layer 4 at this time.      The pancreas is hyperechoic with areas of lobularity. The pancreatic       duct in the pancreatic head (1.7 mm -> 3.0 mm), genu of the pancreas       (1.7 mm -> 1.9 mm), body of the pancreas (2.4 mm) and tail of the       pancreas (1.0 mm).      There was no sign of significant endosonographic abnormality in the       ampullary region. No masses were identified.      The CBD is noted to be 4.9 mm. I cannot visualize the CHD region.      Endosonographic imaging in the visualized portion of the liver showed no       mass.      No malignant-appearing lymph nodes were visualized in the celiac region       (level 20), peripancreatic region and porta hepatis region.      The celiac region was visualized. Impression:               EGD Impression:                           - No gross lesions in esophagus.                           - Portal hypertensive gastropathy.                           - Gastritis of antrum/prepylorus. Biopsied.                           -  Nodular mucosa in the second portion of the                            duodenum. Biopsied.                           EUS Impression:                           - Wall thickening was seen in the second portion of                            the duodenum. It appeared to primarily be within                            the luminal interface/superficial mucosa (Layer 1),                            deep mucosa (Layer 2) and submucosa (Layer 3).                            Layer 4 seemed to be non-contiguous with this area.                             Ductular structures were noted in the region.                           - The pancreatic duct in the pancreatic head, genu                            of the pancreas, body of the pancreas and tail of                            the pancreas.                           - There was no sign of significant pathology in the                            ampulla.                           - CHD region is not visualized - query previous                            surgical intervention.                           - No malignant-appearing lymph nodes were                            visualized in the celiac region (level 20),                            peripancreatic region and  porta hepatis region. Recommendation:           - The patient will be observed post-procedure,                            until all discharge criteria are met.                           - Discharge patient to home.                           - Recommend MRICP non-urgently to evaluate the PD.                           - Observe patient's clinical course.                           - Await path results.                           - Will consider likely repeat EGD/EUS in 1-year                            based on final pathology to monitor for stability                            of this region.                           - We will see back in clinic in the coming weeks.                           - The findings and recommendations were discussed                            with the patient. Procedure Code(s):        --- Professional ---                           [id number], Esophagogastroduodenoscopy, flexible,                            transoral; with endoscopic ultrasound examination                            limited to the esophagus, stomach or duodenum, and                            adjacent structures                           43239, Esophagogastroduodenoscopy, flexible,                            transoral; with  biopsy, single or multiple Diagnosis Code(s):        --- Professional ---  K76.6, Portal hypertension                           K31.89, Other diseases of stomach and duodenum                           K29.70, Gastritis, unspecified, without bleeding                           I89.9, Noninfective disorder of lymphatic vessels                            and lymph nodes, unspecified                           R93.3, Abnormal findings on diagnostic imaging of                            other parts of digestive tract CPT copyright 2019 [employer]. All rights reserved. The codes documented in this report are preliminary and upon coder review may  be revised to meet current compliance requirements. Justice Britain, MD 01/09/2020 12:57:58 PM Number of Addenda: 0

## 2020-01-09 NOTE — Anesthesia Procedure Notes (Signed)
Procedure Name: MAC Date/Time: 01/09/2020 10:55 AM Performed by: Alain Marion, CRNA Pre-anesthesia Checklist: Patient identified, Emergency Drugs available, Suction available and Patient being monitored Oxygen Delivery Method: Simple face mask Placement Confirmation: positive ETCO2

## 2020-01-09 NOTE — Transfer of Care (Signed)
Immediate Anesthesia Transfer of Care Note  Patient: Sabrina Wyatt  Procedure(s) Performed: ESOPHAGOGASTRODUODENOSCOPY (EGD) WITH PROPOFOL (N/A ) ENDOSCOPIC MUCOSAL RESECTION (N/A ) ESOPHAGEAL ENDOSCOPIC ULTRASOUND (EUS) RADIAL (N/A ) BIOPSY  Patient Location: Endoscopy Unit  Anesthesia Type:MAC  Level of Consciousness: awake, alert  and oriented  Airway & Oxygen Therapy: Patient Spontanous Breathing and Patient connected to nasal cannula oxygen  Post-op Assessment: Report given to RN and Post -op Vital signs reviewed and stable  Post vital signs: Reviewed and stable  Last Vitals:  Vitals Value Taken Time  BP 174/110   Temp    Pulse 68   Resp 12   SpO2 100     Last Pain:  Vitals:   01/09/20 1000  TempSrc: Temporal  PainSc: 0-No pain         Complications: No apparent anesthesia complications

## 2020-01-09 NOTE — Interval H&P Note (Signed)
History and Physical Interval Note:  01/09/2020 10:11 AM  Sabrina Wyatt  has presented today for surgery, with the diagnosis of duodenal polyp.  The various methods of treatment have been discussed with the patient and family. After consideration of risks, benefits and other options for treatment, the patient has consented to  Procedure(s) with comments: ESOPHAGOGASTRODUODENOSCOPY (EGD) WITH PROPOFOL (N/A) ENDOSCOPIC MUCOSAL RESECTION (N/A) ESOPHAGEAL ENDOSCOPIC ULTRASOUND (EUS) RADIAL (N/A) - EUS-EMR 79min  as a surgical intervention.  The patient's history has been reviewed, patient examined, no change in status, stable for surgery.  I have reviewed the patient's chart and labs.  Questions were answered to the patient's satisfaction.     [employer]

## 2020-01-10 LAB — SURGICAL PATHOLOGY

## 2020-01-13 ENCOUNTER — Encounter: Payer: Self-pay | Admitting: Gastroenterology

## 2020-01-14 ENCOUNTER — Ambulatory Visit (HOSPITAL_COMMUNITY): Payer: Medicare Other

## 2020-01-15 ENCOUNTER — Telehealth: Payer: Self-pay

## 2020-01-15 DIAGNOSIS — K3189 Other diseases of stomach and duodenum: Secondary | ICD-10-CM

## 2020-01-15 NOTE — Telephone Encounter (Signed)
Mansouraty, Telford Nab., MD  Timothy Lasso, RN  Please send letter when able.  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  Please schedule a clinic visit a week or 2 after the MRI has been completed.  Tentative EGD/EUS 1 year recall.  Thanks.  GM

## 2020-01-16 ENCOUNTER — Other Ambulatory Visit (HOSPITAL_COMMUNITY): Payer: Self-pay | Admitting: Family Medicine

## 2020-01-16 ENCOUNTER — Other Ambulatory Visit: Payer: Self-pay

## 2020-01-16 ENCOUNTER — Ambulatory Visit (HOSPITAL_COMMUNITY)
Admission: RE | Admit: 2020-01-16 | Discharge: 2020-01-16 | Disposition: A | Discharge status: Home / Self Care | Payer: Medicare Other | Source: Ambulatory Visit | Attending: Family Medicine | Admitting: Family Medicine

## 2020-01-16 DIAGNOSIS — M160 Bilateral primary osteoarthritis of hip: Secondary | ICD-10-CM

## 2020-01-16 NOTE — Telephone Encounter (Signed)
You have been scheduled for an MRI at [facility] on 01/30/20. Your appointment time is 9 am. Please arrive 30 minutes prior to your appointment time for registration purposes. Please make certain not to have anything to eat or drink 4 hours prior to your test. In addition, if you have any metal in your body, have a pacemaker or defibrillator, please be sure to let your ordering physician know. This test typically takes 45 minutes to 1 hour to complete. Should you need to reschedule, please call [phone] to do so.  ROV scheduled on 02/12/20 at 910 am with Dr Rush Landmark   The patient has been notified of this information and all questions answered.

## 2020-01-18 ENCOUNTER — Other Ambulatory Visit: Payer: Self-pay

## 2020-01-18 ENCOUNTER — Ambulatory Visit (HOSPITAL_COMMUNITY)
Admission: RE | Admit: 2020-01-18 | Discharge: 2020-01-18 | Disposition: A | Discharge status: Home / Self Care | Payer: Medicare Other | Source: Ambulatory Visit | Attending: Nurse Practitioner | Admitting: Nurse Practitioner

## 2020-01-18 DIAGNOSIS — F1010 Alcohol abuse, uncomplicated: Secondary | ICD-10-CM | POA: Diagnosis present

## 2020-01-18 DIAGNOSIS — K7460 Unspecified cirrhosis of liver: Secondary | ICD-10-CM | POA: Diagnosis present

## 2020-01-18 DIAGNOSIS — B182 Chronic viral hepatitis C: Secondary | ICD-10-CM | POA: Diagnosis present

## 2020-01-18 DIAGNOSIS — K3189 Other diseases of stomach and duodenum: Secondary | ICD-10-CM | POA: Diagnosis present

## 2020-01-30 ENCOUNTER — Other Ambulatory Visit: Payer: Self-pay

## 2020-01-30 ENCOUNTER — Ambulatory Visit (HOSPITAL_COMMUNITY)
Admission: RE | Admit: 2020-01-30 | Discharge: 2020-01-30 | Disposition: A | Discharge status: Home / Self Care | Payer: Medicare Other | Source: Ambulatory Visit | Attending: Gastroenterology | Admitting: Gastroenterology

## 2020-01-30 ENCOUNTER — Other Ambulatory Visit: Payer: Self-pay | Admitting: Gastroenterology

## 2020-01-30 DIAGNOSIS — K3189 Other diseases of stomach and duodenum: Secondary | ICD-10-CM

## 2020-01-30 MED ORDER — GADOBUTROL 1 MMOL/ML IV SOLN
9.0000 mL | Freq: Once | INTRAVENOUS | Status: AC | PRN
  Administered 2020-01-30: 9 mL via INTRAVENOUS

## 2020-02-12 ENCOUNTER — Ambulatory Visit (INDEPENDENT_AMBULATORY_CARE_PROVIDER_SITE_OTHER): Payer: Medicare Other | Admitting: Gastroenterology

## 2020-02-12 ENCOUNTER — Encounter: Payer: Self-pay | Admitting: Gastroenterology

## 2020-02-12 VITALS — BP 140/80 | HR 86 | Ht 65.0 in | Wt 169.4 lb

## 2020-02-12 DIAGNOSIS — K3189 Other diseases of stomach and duodenum: Secondary | ICD-10-CM

## 2020-02-12 DIAGNOSIS — K7460 Unspecified cirrhosis of liver: Secondary | ICD-10-CM | POA: Diagnosis not present

## 2020-02-12 DIAGNOSIS — R600 Localized edema: Secondary | ICD-10-CM

## 2020-02-12 NOTE — Patient Instructions (Signed)
You will need a follow up EUS at the hospital in 1 year, we will send you a reminder of that appointment  TED Hose 4-6 hours during the day  Thank you for choosing [facility]  Valarie Merino Mansouraty,MD

## 2020-02-12 NOTE — Progress Notes (Signed)
[place] VISIT   Primary Care Provider Lucia Gaskins, MD [place] [address] [phone]   Patient Profile: Sabrina Wyatt is a 69 y.o. female with a pmh significant for cirrhosis (secondary to previous HCV status post eradication and alcohol use with portal hypertensive gastropathy and thrombocytopenia), arthritis, MDD/anxiety/PTSD, seizure disorder, status post cholecystectomy, chronic abdominal pain, GERD, duodenal nodule.  The patient presents to the [facility] for an evaluation and management of problem(s) noted below:  Problem List 1. Duodenal nodule   2. Cirrhosis of liver without ascites, unspecified hepatic cirrhosis type ([place])   3. Lower extremity edema     History of Present Illness Please see prior note for full details of HPI.  Interval History The patient returns for scheduled follow-up.  The patient underwent her EGD/EUS with me in May.  A MRI/MRCP was performed thereafter.  Patient states she has had no new symptoms and is feeling well.  She continues on her PPI therapy.  No new nausea or vomiting.  She has been experiencing some bilateral lower extremity edema towards the end of the day as well as some swelling of her hands bilaterally.  She has always had some darkness/purple discoloration of her right hand but not on her left hand and wrist in the day she is having more issues of swelling and purplish feet/toes bilaterally.  She is not noted any increase in her weight.  No new dyspnea on exertion.  No new chest pain.  She is going to be seeing her primary care doctor soon.  The patient denies any issues with jaundice, scleral icterus, pruritus, darkened/amber urine, clay-colored stools, hematemesis, coffee-ground emesis, abdominal distention, confusion, or new generalized pruritus.  GI Review of Systems Positive as above Negative for nausea, vomiting, dysphagia, odynophagia, change in bowel  habits  Review of Systems General: Denies fevers/chills/weight loss or weight gain Cardiovascular: Denies chest pain/palpitations Pulmonary: Denies shortness of breath Gastroenterological: See HPI Genitourinary: Denies darkened urine Hematological: Positive for easy bruising/bleeding Dermatological: Denies jaundice Psychological: Mood is stable   Medications Current Outpatient Medications  Medication Sig Dispense Refill  . acyclovir (ZOVIRAX) 400 MG tablet Take 1 tablet (400 mg total) by mouth 2 (two) times daily. 180 tablet 3  . albuterol (PROVENTIL) (2.5 MG/3ML) 0.083% nebulizer solution Take 2.5 mg by nebulization 2 (two) times daily as needed for wheezing or shortness of breath.     Marland Kitchen ascorbic acid (VITAMIN C) 250 MG CHEW Chew 250 mg by mouth daily.    Marland Kitchen atenolol (TENORMIN) 25 MG tablet Take 25 mg by mouth every morning.     . brexpiprazole (REXULTI) 1 MG TABS tablet Take 1 mg by mouth daily.    . Cholecalciferol (VITAMIN D) 50 MCG (2000 UT) CAPS Take 10,000 Units by mouth daily.    . DULoxetine (CYMBALTA) 60 MG capsule Take 60 mg by mouth daily.     Marland Kitchen EPIPEN 2-PAK 0.3 MG/0.3ML SOAJ injection Inject 0.3 mg into the skin as needed (allergic reactions).     Marland Kitchen etodolac (LODINE) 400 MG tablet Take 400 mg by mouth daily.    . Flaxseed, Linseed, (FLAXSEED OIL PO) Take 1 capsule by mouth daily.    . fluticasone (FLONASE) 50 MCG/ACT nasal spray Place 1 spray into both nostrils 2 (two) times daily.     Marland Kitchen gabapentin (NEURONTIN) 300 MG capsule Take 300 mg by mouth 3 (three) times daily.    Marland Kitchen loratadine (CLARITIN) 10 MG tablet Take 10 mg  by mouth daily.     Marland Kitchen omeprazole (PRILOSEC) 20 MG capsule Take 2 capsules (40 mg total) by mouth 2 (two) times daily before a meal. 60 capsule 4  . oxyCODONE (OXY IR/ROXICODONE) 5 MG immediate release tablet Take 5 mg by mouth 4 (four) times daily.     . pravastatin (PRAVACHOL) 40 MG tablet Take 40 mg by mouth daily.    Marland Kitchen PROAIR HFA 108 (90 BASE) MCG/ACT  inhaler Inhale 2 puffs into the lungs every 6 (six) hours as needed for wheezing or shortness of breath.     Marland Kitchen tiZANidine (ZANAFLEX) 2 MG tablet Take 4 mg by mouth daily.     No current facility-administered medications for this visit.    Allergies Allergies  Allergen Reactions  . Asa [Aspirin] Nausea Only  . Bee Venom Hives  . Codeine Nausea Only  . Tylenol [Acetaminophen] Nausea And Vomiting and Swelling    Swelling of hands and feet    Histories Past Medical History:  Diagnosis Date  . Abdominal wall pain    chronic  . Anemia   . Arthritis    rheumatoid  . Biliary stone   . Chronic abdominal pain   . Chronic flank pain   . Chronic low back pain   . Cirrhosis, hepatitis C    U/S on 03/09/16= no HCC, pt has been vaccinated for Hep A and Hep B. s/p treatment of HCV with eradication  . Depression   . Gall stones, common bile duct 2002  . GERD (gastroesophageal reflux disease)   . Gonorrhea   . Hepatitis     successfully treated for Hep C; eradicated  . Hypertension   . 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 Intussusception ([place]) 04/2012  . Polyp of duodenum    Duodenal polyp  . Polysubstance abuse ([place])    HX of  . PTSD (post-traumatic stress disorder)   . Pulmonary nodules   . S/P colonoscopy 2005   Dr Moss Mc polyp removed, otherwise normal  . S/P endoscopy 08/27/10   antral erosions, otherwise normal, due egd 08/2012 to screen for varices  . Seizures ([place]) 1998   with brain bleed x1. None since then and on no meds  . Shortness of breath   . Tobacco abuse    Past Surgical History:  Procedure Laterality Date  . biliary stone removal  2015  . BIOPSY N/A 03/06/2015   Procedure: BIOPSY;  Surgeon: Daneil Dolin, MD;  Location: [facility];  Service: Endoscopy;  Laterality: N/A;  . BIOPSY  11/22/2019   Procedure: BIOPSY;  Surgeon: Daneil Dolin, MD;  [facility];  Service: Endoscopy;;  . BIOPSY  01/09/2020   Procedure: BIOPSY;  Surgeon:  Irving Copas., MD;  Location: [facility];  Service: Gastroenterology;;  . Wilmon Pali RELEASE Bilateral 12/2010  . CATARACT EXTRACTION W/PHACO Right 01/15/2016   Procedure: CATARACT EXTRACTION PHACO AND INTRAOCULAR LENS PLACEMENT RIGHT EYE CDE=5.88;  Surgeon: Tonny Branch, MD;  Location: [facility];  Service: Ophthalmology;  Laterality: Right;  . CATARACT EXTRACTION W/PHACO Left 02/12/2016   Procedure: CATARACT EXTRACTION PHACO AND INTRAOCULAR LENS PLACEMENT LEFT EYE; CDE:  6.02;  Surgeon: Tonny Branch, MD;  Location: [facility];  Service: Ophthalmology;  Laterality: Left;  . CHOLECYSTECTOMY  2002  . COLONOSCOPY    09/10/2003   diminutive polyp in the rectum cold biopsied/removed/ Normal colon  . COLONOSCOPY WITH PROPOFOL N/A 08/02/2013   DGL:OVFIEPP diverticulosis. next TCS 10 years.  Marland Kitchen  ESOPHAGOGASTRODUODENOSCOPY    09/10/2003   Small hiatal hernia; otherwise normal stomach, normal D1 and D2  . ESOPHAGOGASTRODUODENOSCOPY  08/27/2010   WNU:UVOZDG esophagus.  No varices.  Couple of tiny antral erosions of doubtful clinical significance, otherwise normal stomach, D1 and D2.  . ESOPHAGOGASTRODUODENOSCOPY (EGD) WITH PROPOFOL N/A 08/02/2013   RMR: Portal gastropathy. No explanation for abdominal pain which I believe is more abdominal wall in origin. She has been seen by Dr. Carlis Abbott  over at [facility]. She is up-to-date on cross sectional imaging. She has a pain management physician in [place]. Repeat EGD for varices in 3 years.  . ESOPHAGOGASTRODUODENOSCOPY (EGD) WITH PROPOFOL N/A 03/06/2015   UYQ:IHKVQQVZ gastric mucosac s/p bx  . ESOPHAGOGASTRODUODENOSCOPY (EGD) WITH PROPOFOL N/A 04/18/2017   normal esophagus, portal hypertensive gastropathy, normal duodenum, no specimens collected. Surveillance in Aug 2020  . ESOPHAGOGASTRODUODENOSCOPY (EGD) WITH PROPOFOL N/A 11/22/2019   Procedure: ESOPHAGOGASTRODUODENOSCOPY (EGD) WITH PROPOFOL;  Surgeon: Daneil Dolin, MD;  [facility];  Service:  Endoscopy;  Laterality: N/A;  1:45pm-office rescheduled to 4/1 @ 1:00pm  . ESOPHAGOGASTRODUODENOSCOPY (EGD) WITH PROPOFOL N/A 01/09/2020   Procedure: ESOPHAGOGASTRODUODENOSCOPY (EGD) WITH PROPOFOL;  Surgeon: Rush Landmark Telford Nab., MD;  Location: [place] [facility];  Service: Gastroenterology;  Laterality: N/A;  . EUS N/A 01/09/2020   Procedure: ESOPHAGEAL ENDOSCOPIC ULTRASOUND (EUS) RADIAL;  Surgeon: Rush Landmark Telford Nab., MD;  Location: [facility];  Service: Gastroenterology;  Laterality: N/A;  . LUMBAR FUSION    . NOSE SURGERY  1970  . PERCUTANEOUS TRANSHEPATIC CHOLANGIOGRAHPY AND BILLIARY DRAINAGE  2002  . REPAIR VAGINAL CUFF N/A 10/25/2015   Procedure: REPAIR VAGINAL LACERATION ;  Surgeon: Jonnie Kind, MD;  Location: [facility];  Service: Gynecology;  Laterality: N/A;  . Roux-en-y-hepatojejunostomy  2003   revision in 2013 for intussusception [facility] Dr[facility] Bailey Mech)  . SPINAL FUSION     C5-C7  . TONSILLECTOMY     Social History   Socioeconomic History  . Marital status: Single    Spouse name: Not on file  . Number of children: 1  . Years of education: Not on file  . Highest education level: Not on file  Occupational History  . Occupation: disabled    Fish farm manager: UNEMPLOYED  Tobacco Use  . Smoking status: Current Every Day Smoker    Packs/day: 0.25    Years: 33.00    Pack years: 8.25    Types: Cigarettes  . Smokeless tobacco: Never Used  . Tobacco comment: 4 cig daily  Vaping Use  . Vaping Use: Never used  Substance and Sexual Activity  . Alcohol use: Not Currently    Alcohol/week: 0.0 standard drinks    Comment: As of 01/08/20: Last ETOH 3 days ago (2 glasses of wine)  . Drug use: No  . Sexual activity: Yes    Partners: Male    Birth control/protection: Post-menopausal, Condom  Other Topics Concern  . Not on file  Social History Narrative  . Not on file   Social Determinants of Health   Financial Resource Strain:   . Difficulty of Paying Living Expenses:   Food  Insecurity:   . Worried About Charity fundraiser in the Last Year:   . Arboriculturist in the Last Year:   Transportation Needs:   . Film/video editor (Medical):   Marland Kitchen Lack of Transportation (Non-Medical):   Physical Activity:   . Days of Exercise per Week:   . Minutes of Exercise per Session:   Stress:   .  Feeling of Stress :   Social Connections:   . Frequency of Communication with Friends and Family:   . Frequency of Social Gatherings with Friends and Family:   . Attends Religious Services:   . Active Member of Clubs or Organizations:   . Attends Archivist Meetings:   Marland Kitchen Marital Status:   Intimate Partner Violence:   . Fear of Current or Ex-Partner:   . Emotionally Abused:   Marland Kitchen Physically Abused:   . Sexually Abused:    Family History  Problem Relation Age of Onset  . Coronary artery disease Brother   . Cancer Sister        lymphoma  . Cancer Father        brain  . Colon cancer Neg Hx   . Pancreatic cancer Neg Hx   . Stomach cancer Neg Hx   . Esophageal cancer Neg Hx   . Inflammatory bowel disease Neg Hx   . Liver disease Neg Hx   . Rectal cancer Neg Hx    I have reviewed her medical, social, and family history in detail and updated the electronic medical record as necessary.    PHYSICAL EXAMINATION  BP 140/80 (BP Location: Left Arm, Patient Position: Sitting)   Pulse 86   Ht 5\' 5"  (1.651 m)   Wt 169 lb 6.4 oz (76.8 kg)   SpO2 96%   BMI 28.19 kg/m  Wt Readings from Last 3 Encounters:  02/12/20 169 lb 6.4 oz (76.8 kg)  01/09/20 175 lb (79.4 kg)  01/02/20 175 lb (79.4 kg)  GEN: NAD, appears stated age, doesn't appear chronically ill PSYCH: Cooperative, without pressured speech EYE: Conjunctivae pink, sclerae anicteric ENT: MMM CV: Nontachycardic RESP: No audible wheezing present GI: NABS, soft, protuberant abdomen, nontender, no rebound MSK/EXT: Bilateral lower extremity edema 1/2+, bilateral hand swelling with purplish discoloration  noted SKIN: No jaundice NEURO:  Alert & Oriented x 3, no focal deficits, no evidence of asterixis   REVIEW OF DATA  I reviewed the following data at the time of this encounter:  GI Procedures and Studies  May 2021 EGD/EUS EGD Impression: - No gross lesions in esophagus. - Portal hypertensive gastropathy. - Gastritis of antrum/prepylorus. Biopsied. - Nodular mucosa in the second portion of the duodenum. Biopsied. EUS Impression: - Wall thickening was seen in the second portion of the duodenum. It appeared to primarily be within the luminal interface/superficial mucosa (Layer 1), deep mucosa (Layer 2) and submucosa (Layer 3). Layer 4 seemed to be non-contiguous with this area. Ductular structures were noted in the region. - The pancreatic duct in the pancreatic head, genu of the pancreas, body of the pancreas and tail of the pancreas. - There was no sign of significant pathology in the ampulla. - CHD region is not visualized - query previous surgical intervention. - No malignant-appearing lymph nodes were visualized in the celiac region (level 20), peripancreatic region and porta hepatis region. Pathology FINAL MICROSCOPIC DIAGNOSIS:  A. STOMACH, RANDOM, BIOPSY:  - Chronic active gastritis with surface ulceration.  - There is no evidence of Helicobacter pylori, dysplasia, or malignancy.  - See comment.  B. DUODENUM, NODULE, BIOPSY:  - Benign small bowel type mucosa.  - There is no evidence of dysplasia or malignancy.   Laboratory Studies  Reviewed those in epic  Imaging Studies  June 2021 MRI/MRCP FINDINGS: Lower chest: Unremarkable. Hepatobiliary: Heterogeneous but diffuse loss of signal intensity throughout the hepatic parenchyma, indicative of severe hepatic steatosis. Liver has a shrunken appearance  and nodular contour, indicative of advanced hepatic cirrhosis. No suspicious cystic or solid hepatic lesions. Specifically, no hypervascular lesion identified on arterial  phase imaging. Status post cholecystectomy. No intra or extrahepatic biliary ductal dilatation noted on MRCP images. Pancreas: No pancreatic mass. No pancreatic ductal dilatation. No pancreatic or peripancreatic fluid collections or inflammatory changes. Spleen:  Unremarkable. Adrenals/Urinary Tract: Subcentimeter T1 hypointense, T2 hyperintense, nonenhancing lesions in the right kidney, compatible with tiny simple cysts. Left kidney and bilateral adrenal glands are normal in appearance. No hydroureteronephrosis in the visualized portions of the abdomen. Stomach/Bowel: Visualized portions are unremarkable. Specifically, no definite adrenal lesion confidently identified on today's examination. Vascular/Lymphatic: Aortic atherosclerosis, without evidence of aneurysm in the abdominal vasculature. No lymphadenopathy noted in the abdomen. Other: No significant volume of ascites noted in the visualized portions of the peritoneal cavity. Musculoskeletal: Susceptibility artifact in the lumbar spine related to a indwelling spinal hardware. No aggressive appearing osseous lesions are noted in the visualized portions of the skeleton. IMPRESSION: 1. Morphologic changes of advanced cirrhosis, with severe hepatic steatosis. No suspicious hepatic lesions are noted at this time. 2. Additional incidental findings, as above.   ASSESSMENT  Sabrina Wyatt is a 69 y.o. female with a pmh significant for cirrhosis (secondary to previous HCV status post eradication and alcohol use with portal hypertensive gastropathy and thrombocytopenia), arthritis, MDD/anxiety/PTSD, seizure disorder, status post cholecystectomy, chronic abdominal pain, GERD, duodenal nodule.  The patient is seen today for evaluation and management of:  1. Duodenal nodule   2. Cirrhosis of liver without ascites, unspecified hepatic cirrhosis type ([place])   3. Lower extremity edema    Patient is hemodynamically and clinically stable.  From the  perspective of the duodenal nodule that I found an EUS, repeat biopsies showed just benign findings.  MRI/MRCP was performed to ensure that no lesion was noted at the minor ampulla.  Nothing was noted and there is no pancreatic duct dilation.  Patient is doing well.  I plan will be for follow-up EGD/EUS in 1 year to reevaluate the area.  We will schedule it as a 90-minute procedure in case a resection were to be needed at that time.  I do not suspect there will and if things look stable we likely would then push out her follow-up/surveillance for few years from there.  Patient is having new lower extremity edema as well as upper extremity edema.  This is not clearly a defined liver decompensation and her weight is stable.  It looks like it is more a vascular issue or venous return issue.  I have asked her to go to [facility] or [facility] to obtain TED hose and trial that.  She will discuss with her PCP coming days to see if anything else additionally needs to be done.  If she begins to have abdominal distention or weight gain with the persistent lower extremity edema then she needs to reach out to Dr. Gala Romney as she may require diuretic therapy if it is not already initiated by her PCP.  All patient questions were answered, to the best of my ability, and the patient agrees to the aforementioned plan of action with follow-up as indicated.   PLAN  Follow-up in 1 year for EGD/EUS with possible EMR of duodenal nodule/lesion Regular follow-up with Dr. Gala Romney and NP Gordy Levan from a liver perspective Follow-up with PCP for issues of upper and lower extremity edema developing Obtain TED hose and initiate use   No orders of the defined types were  placed in this encounter.   New Prescriptions   No medications on file   Modified Medications   No medications on file    Planned Follow Up No follow-ups on file.   Total Time in Face-to-Face and in Coordination of Care for patient including  independent/personal interpretation/review of prior testing, medical history, examination, medication adjustment, communicating results with the patient directly, and documentation with the EHR is 25 minutes.   Justice Britain, MD [facility] Office # [phone]

## 2020-02-20 ENCOUNTER — Ambulatory Visit (HOSPITAL_COMMUNITY): Payer: Medicare Other

## 2020-03-13 ENCOUNTER — Other Ambulatory Visit (HOSPITAL_COMMUNITY): Payer: Self-pay | Admitting: Family Medicine

## 2020-03-13 DIAGNOSIS — M25561 Pain in right knee: Secondary | ICD-10-CM

## 2020-03-21 ENCOUNTER — Other Ambulatory Visit: Payer: Self-pay

## 2020-03-21 ENCOUNTER — Ambulatory Visit (HOSPITAL_COMMUNITY)
Admission: RE | Admit: 2020-03-21 | Discharge: 2020-03-21 | Disposition: A | Discharge status: Home / Self Care | Payer: Medicare Other | Source: Ambulatory Visit | Attending: Family Medicine | Admitting: Family Medicine

## 2020-03-21 DIAGNOSIS — M25561 Pain in right knee: Secondary | ICD-10-CM | POA: Insufficient documentation

## 2020-03-25 ENCOUNTER — Other Ambulatory Visit: Payer: Self-pay | Admitting: Family Medicine

## 2020-03-25 DIAGNOSIS — C44709 Unspecified malignant neoplasm of skin of left lower limb, including hip: Secondary | ICD-10-CM

## 2020-04-14 ENCOUNTER — Other Ambulatory Visit: Payer: Self-pay

## 2020-04-14 ENCOUNTER — Encounter (HOSPITAL_COMMUNITY): Payer: Self-pay

## 2020-04-14 ENCOUNTER — Ambulatory Visit (HOSPITAL_COMMUNITY)
Admission: RE | Admit: 2020-04-14 | Discharge: 2020-04-14 | Disposition: A | Discharge status: Home / Self Care | Payer: Medicare Other | Source: Ambulatory Visit | Attending: Family Medicine | Admitting: Family Medicine

## 2020-04-14 DIAGNOSIS — C44709 Unspecified malignant neoplasm of skin of left lower limb, including hip: Secondary | ICD-10-CM

## 2020-04-30 ENCOUNTER — Ambulatory Visit (HOSPITAL_COMMUNITY): Payer: Medicare Other

## 2020-05-12 ENCOUNTER — Other Ambulatory Visit: Payer: Self-pay

## 2020-05-12 ENCOUNTER — Ambulatory Visit (HOSPITAL_COMMUNITY)
Admission: RE | Admit: 2020-05-12 | Discharge: 2020-05-12 | Disposition: A | Discharge status: Home / Self Care | Payer: Medicare Other | Source: Ambulatory Visit | Attending: Family Medicine | Admitting: Family Medicine

## 2020-05-12 DIAGNOSIS — C44709 Unspecified malignant neoplasm of skin of left lower limb, including hip: Secondary | ICD-10-CM | POA: Insufficient documentation

## 2020-05-12 MED ORDER — GADOBUTROL 1 MMOL/ML IV SOLN
7.0000 mL | Freq: Once | INTRAVENOUS | Status: AC | PRN
  Administered 2020-05-12: 7 mL via INTRAVENOUS

## 2020-06-05 ENCOUNTER — Other Ambulatory Visit: Payer: Self-pay | Admitting: Gastroenterology

## 2020-06-26 ENCOUNTER — Encounter: Payer: Self-pay | Admitting: Orthopedic Surgery

## 2020-06-26 ENCOUNTER — Other Ambulatory Visit: Payer: Self-pay

## 2020-06-26 ENCOUNTER — Ambulatory Visit (INDEPENDENT_AMBULATORY_CARE_PROVIDER_SITE_OTHER): Payer: Medicare Other | Admitting: Orthopedic Surgery

## 2020-06-26 VITALS — BP 170/101 | HR 83 | Ht 65.0 in | Wt 166.0 lb

## 2020-06-26 DIAGNOSIS — M1710 Unilateral primary osteoarthritis, unspecified knee: Secondary | ICD-10-CM

## 2020-06-26 DIAGNOSIS — M1711 Unilateral primary osteoarthritis, right knee: Secondary | ICD-10-CM

## 2020-06-26 DIAGNOSIS — M8440XA Pathological fracture, unspecified site, initial encounter for fracture: Secondary | ICD-10-CM | POA: Diagnosis not present

## 2020-06-26 DIAGNOSIS — M899 Disorder of bone, unspecified: Secondary | ICD-10-CM | POA: Diagnosis not present

## 2020-06-26 DIAGNOSIS — M23321 Other meniscus derangements, posterior horn of medial meniscus, right knee: Secondary | ICD-10-CM | POA: Diagnosis not present

## 2020-06-26 NOTE — Patient Instructions (Addendum)
Call [facility] to make appt for biopsy right knee [phone] is the phone number  Call [facility] to get copy of the MRI images on CD to take with you. [phone] ask for Radiology

## 2020-06-26 NOTE — Progress Notes (Signed)
Chief Complaint  Patient presents with  . Knee Pain    right since 05/25/20    History this is a 69 year old female presents to [place] with acute onset of right knee pain on May 25, 2020.  She denies any trauma but when she got out of bed that morning she felt acute sharp pain on the medial side of the right knee that radiated up across the knee towards the thigh.  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  Her pain however has gotten much better there are also other findings on the MRI which showed a subchondral insufficiency fracture medial femoral condyle insufficiency fracture subchondral medial tibial plateau as well as a posterior horn medial meniscus tear and some mild arthritis in the medial compartment  Review of systems she denies fatigue weight loss congestion blurred vision shortness of breath chest pain abdominal pain urgency frequency other joint pain neurologic symptoms suicide thoughts street drugs hallucinations memory loss easy bleeding pollen allergy or excessive thirst  Past Medical History:  Diagnosis Date  . Abdominal wall pain    chronic  . Anemia   . Arthritis    rheumatoid  . Biliary stone   . Chronic abdominal pain   . Chronic flank pain   . Chronic low back pain   . Cirrhosis, hepatitis C    U/S on 03/09/16= no HCC, pt has been vaccinated for Hep A and Hep B. s/p treatment of HCV with eradication  . Depression   . Gall stones, common bile duct 2002  . GERD (gastroesophageal reflux disease)   . Gonorrhea   . Hepatitis     successfully treated for Hep C; eradicated  . Hypertension   . 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 Intussusception ([place]) 04/2012  . Polyp of duodenum    Duodenal polyp  . Polysubstance abuse ([place])    HX of  . PTSD (post-traumatic stress disorder)   . Pulmonary nodules   . S/P  colonoscopy 2005   Dr Moss Mc polyp removed, otherwise normal  . S/P endoscopy 08/27/10   antral erosions, otherwise normal, due egd 08/2012 to screen for varices  . Seizures ([place]) 1998   with brain bleed x1. None since then and on no meds  . Shortness of breath   . Tobacco abuse    Past Surgical History:  Procedure Laterality Date  . biliary stone removal  2015  . BIOPSY N/A 03/06/2015   Procedure: BIOPSY;  Surgeon: Daneil Dolin, MD;  [facility]: [facility];  Service: Endoscopy;  Laterality: N/A;  . BIOPSY  11/22/2019   Procedure: BIOPSY;  Surgeon: Daneil Dolin, MD;  [facility];  Service: Endoscopy;;  . BIOPSY  01/09/2020   Procedure: BIOPSY;  Surgeon: Irving Copas., MD;  Location: [place];  Service: Gastroenterology;;  . Wilmon Pali RELEASE Bilateral 12/2010  . CATARACT EXTRACTION W/PHACO Right 01/15/2016   Procedure: CATARACT EXTRACTION PHACO AND INTRAOCULAR LENS PLACEMENT RIGHT EYE CDE=5.88;  Surgeon: Tonny Branch, MD;  Location: [facility];  Service: Ophthalmology;  Laterality: Right;  . CATARACT EXTRACTION W/PHACO Left 02/12/2016   Procedure: CATARACT EXTRACTION PHACO AND INTRAOCULAR LENS PLACEMENT LEFT EYE; CDE:  6.02;  Surgeon: Tonny Branch, MD;  [facility]: [facility];  Service: Ophthalmology;  Laterality: Left;  . CHOLECYSTECTOMY  2002  . COLONOSCOPY    09/10/2003  diminutive polyp in the rectum cold biopsied/removed/ Normal colon  . COLONOSCOPY WITH PROPOFOL N/A 08/02/2013   VOZ:[id number] diverticulosis. next TCS 10 years.  . ESOPHAGOGASTRODUODENOSCOPY    09/10/2003   Small hiatal hernia; otherwise normal stomach, normal D1 and D2  . ESOPHAGOGASTRODUODENOSCOPY  08/27/2010   KVQ:[id number] esophagus.  No varices.  Couple of tiny antral erosions of doubtful clinical significance, otherwise normal stomach, D1 and D2.  . ESOPHAGOGASTRODUODENOSCOPY (EGD) WITH PROPOFOL N/A 08/02/2013   RMR: Portal gastropathy. No explanation for abdominal pain which I believe is more abdominal  wall in origin. She has been seen by Dr. Carlis Abbott  over at [facility]. She is up-to-date on cross sectional imaging. She has a pain management physician in [place]. Repeat EGD for varices in 3 years.  . ESOPHAGOGASTRODUODENOSCOPY (EGD) WITH PROPOFOL N/A 03/06/2015   OVF:IEPPIRJJ gastric mucosac s/p bx  . ESOPHAGOGASTRODUODENOSCOPY (EGD) WITH PROPOFOL N/A 04/18/2017   normal esophagus, portal hypertensive gastropathy, normal duodenum, no specimens collected. Surveillance in Aug 2020  . ESOPHAGOGASTRODUODENOSCOPY (EGD) WITH PROPOFOL N/A 11/22/2019   Procedure: ESOPHAGOGASTRODUODENOSCOPY (EGD) WITH PROPOFOL;  Surgeon: Daneil Dolin, MD;  [facility];  Service: Endoscopy;  Laterality: N/A;  1:45pm-office rescheduled to 4/1 @ 1:00pm  . ESOPHAGOGASTRODUODENOSCOPY (EGD) WITH PROPOFOL N/A 01/09/2020   Procedure: ESOPHAGOGASTRODUODENOSCOPY (EGD) WITH PROPOFOL;  Surgeon: Rush Landmark Telford Nab., MD;  Location: [place];  Service: Gastroenterology;  Laterality: N/A;  . EUS N/A 01/09/2020   Procedure: ESOPHAGEAL ENDOSCOPIC ULTRASOUND (EUS) RADIAL;  Surgeon: Rush Landmark Telford Nab., MD;  Location: [place];  Service: Gastroenterology;  Laterality: N/A;  . LUMBAR FUSION    . NOSE SURGERY  1970  . PERCUTANEOUS TRANSHEPATIC CHOLANGIOGRAHPY AND BILLIARY DRAINAGE  2002  . REPAIR VAGINAL CUFF N/A 10/25/2015   Procedure: REPAIR VAGINAL LACERATION ;  Surgeon: Jonnie Kind, MD;  Location: [facility];  Service: Gynecology;  Laterality: N/A;  . Roux-en-y-hepatojejunostomy  2003   revision in 2013 for intussusception [facility] Dr. Bailey Mech)  . SPINAL FUSION     C5-C7  . TONSILLECTOMY     Family History  Problem Relation Age of Onset  . Coronary artery disease Brother   . Cancer Sister        lymphoma  . Cancer Father        brain  . Colon cancer Neg Hx   . Pancreatic cancer Neg Hx   . Stomach cancer Neg Hx   . Esophageal cancer Neg Hx   . Inflammatory bowel disease Neg Hx   . Liver disease Neg  Hx   . Rectal cancer Neg Hx    Social History   Tobacco Use  . Smoking status: Current Every Day Smoker    Packs/day: 0.25    Years: 33.00    Pack years: 8.25    Types: Cigarettes  . Smokeless tobacco: Never Used  . Tobacco comment: 4 cig daily  Vaping Use  . Vaping Use: Never used  Substance Use Topics  . Alcohol use: Not Currently    Alcohol/week: 0.0 standard drinks    Comment: As of 01/08/20: Last ETOH 3 days ago (2 glasses of wine)  . Drug use: No    BP (!) 170/101   Pulse 83   Ht 5\' 5"  (1.651 m)   Wt 166 lb (75.3 kg)   BMI 27.62 kg/m   General appearance normal development nutrition grooming hygiene ectomorphic body habitus  Cardiovascular good peripheral pulses no swelling or edema bilaterally  Lymph nodes are negative in the groin  Gait is unremarkable with normal ambulatory heel toe gait without a limp  Her right knee is nontender except at the posterior medial corner of the joint line the tibia and femur are no longer tender she has full range of motion no effusion ligaments are stable muscle tone and strength are normal no atrophy  The skin looks clean the sensation is normal she is oriented x3 mood is pleasant affect is normal  First image is the 4 view knee from the hospital we see a medial lesion with speckled calcification it seems to blend into the regular bone  It is approximately 3 cm in length there are no cortical margins there is no soft tissue swelling posteriorly  Then we have an MRI which shows the endosteal lesion of the medial femoral condyle measured by 2.6 cm it suspicious for chondroid lesion does not look like a metastasis we see the subchondral edema and insufficiency fracture in the medial femur and tibia we also see the medial meniscus radial tear at the root and some chondral changes on the medial femoral condyle patella shows some moderate degenerative changes  Based on the fact the patient needs a biopsy will have to send her to a  pathologist orthopedist for that  She is to call us within 2 weeks if she hasn't gotten an appointment

## 2020-07-08 ENCOUNTER — Ambulatory Visit: Payer: Medicare Other | Admitting: Nurse Practitioner

## 2020-07-09 ENCOUNTER — Other Ambulatory Visit: Payer: Self-pay | Admitting: Gastroenterology

## 2020-07-24 ENCOUNTER — Ambulatory Visit: Payer: Medicare Other | Attending: Internal Medicine

## 2020-07-24 DIAGNOSIS — Z23 Encounter for immunization: Secondary | ICD-10-CM

## 2020-07-24 NOTE — Progress Notes (Signed)
   Covid-19 Vaccination Clinic  Name:  LIANY MUMPOWER    MRN: [id number] DOB: 1950/11/12  07/24/2020  Ms. Molitor was observed post Covid-19 immunization for 15 minutes without incident. She was provided with Vaccine Information Sheet and instruction to access the V-Safe system.   Ms. Kellen was instructed to call 911 with any severe reactions post vaccine: Marland Kitchen Difficulty breathing  . Swelling of face and throat  . A fast heartbeat  . A bad rash all over body  . Dizziness and weakness   Immunizations Administered    No immunizations on file.

## 2020-08-05 ENCOUNTER — Encounter: Payer: Self-pay | Admitting: Internal Medicine

## 2020-08-05 ENCOUNTER — Ambulatory Visit (INDEPENDENT_AMBULATORY_CARE_PROVIDER_SITE_OTHER): Payer: Medicare Other | Admitting: Internal Medicine

## 2020-08-05 ENCOUNTER — Encounter: Payer: Self-pay | Admitting: *Deleted

## 2020-08-05 ENCOUNTER — Other Ambulatory Visit: Payer: Self-pay | Admitting: *Deleted

## 2020-08-05 ENCOUNTER — Other Ambulatory Visit: Payer: Self-pay

## 2020-08-05 VITALS — BP 119/77 | HR 89 | Temp 96.8°F | Ht 65.0 in | Wt 176.6 lb

## 2020-08-05 DIAGNOSIS — K219 Gastro-esophageal reflux disease without esophagitis: Secondary | ICD-10-CM

## 2020-08-05 DIAGNOSIS — K7460 Unspecified cirrhosis of liver: Secondary | ICD-10-CM | POA: Diagnosis not present

## 2020-08-05 DIAGNOSIS — K3189 Other diseases of stomach and duodenum: Secondary | ICD-10-CM | POA: Diagnosis not present

## 2020-08-05 NOTE — Progress Notes (Signed)
Primary Care Physician:  Lucia Gaskins, MD Primary Gastroenterologist:  Dr. Gala Romney  Pre-Procedure History & Physical: HPI:  Sabrina Wyatt is a 69 y.o. female here for follow-up NASH/ASH, HCV cirrhosis. Recent evaluation by Dr. Rush Landmark in [place] for duodenal nodule with MRCP, EGD and EUS.  Fortunately, work-up was negative.  He did recommend a repeat EGD /EUS in 1 year.  She has no varices.  She remains well compensated. Severe steatosis but no evidence of hepatoma on MRI back in June of this year. Weight is stable at 176 pounds. 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  It was resected by Dr. Mylo Red over at [facility].  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  She is getting a metastatic work-up although it is reported metastatic risk is low. Last screening colonoscopy 2014; due for 1 more in 2024. GERD well-controlled on omeprazole 20 mg twice daily.  No dysphagia.   Past Medical History:  Diagnosis Date  . Abdominal wall pain    chronic  . Anemia   . Arthritis    rheumatoid  . Biliary stone   . Chronic abdominal pain   . Chronic flank pain   . Chronic low back pain   . Cirrhosis, hepatitis C    U/S on 03/09/16= no HCC, pt has been vaccinated for Hep A and Hep B. s/p treatment of HCV with eradication  . Depression   . Gall stones, common bile duct 2002  . GERD (gastroesophageal reflux disease)   . Gonorrhea   . Hepatitis     successfully treated for Hep C; eradicated  . Hypertension   . Intracranial hemorrhage ([place]) 1998   left thalmic  . Intussusception ([place]) 04/2012  . Polyp of duodenum    Duodenal polyp  . Polysubstance abuse ([place])    HX of  . PTSD (post-traumatic stress disorder)   . Pulmonary nodules   . S/P colonoscopy 2005   Dr Moss Mc polyp removed, otherwise normal  . S/P endoscopy 08/27/10   antral erosions, otherwise normal, due egd 08/2012 to screen for varices  . Seizures ([place]) 1998    with brain bleed x1. None since then and on no meds  . Shortness of breath   . Tobacco abuse     Past Surgical History:  Procedure Laterality Date  . biliary stone removal  2015  . BIOPSY N/A 03/06/2015   Procedure: BIOPSY;  Surgeon: Daneil Dolin, MD;  [facility];  Service: Endoscopy;  Laterality: N/A;  . BIOPSY  11/22/2019   Procedure: BIOPSY;  Surgeon: Daneil Dolin, MD;  [facility];  Service: Endoscopy;;  . BIOPSY  01/09/2020   Procedure: BIOPSY;  Surgeon: Irving Copas., MD;  Location: [place];  Service: Gastroenterology;;  . Wilmon Pali RELEASE Bilateral 12/2010  . CATARACT EXTRACTION W/PHACO Right 01/15/2016   Procedure: CATARACT EXTRACTION PHACO AND INTRAOCULAR LENS PLACEMENT RIGHT EYE CDE=5.88;  Surgeon: Tonny Branch, MD;  [facility]: [facility];  Service: Ophthalmology;  Laterality: Right;  . CATARACT EXTRACTION W/PHACO Left 02/12/2016   Procedure: CATARACT EXTRACTION PHACO AND INTRAOCULAR LENS PLACEMENT LEFT EYE; CDE:  6.02;  Surgeon: Tonny Branch, MD;  Location: [facility];  Service: Ophthalmology;  Laterality: Left;  . CHOLECYSTECTOMY  2002  . COLONOSCOPY    09/10/2003   diminutive polyp in the rectum cold biopsied/removed/ Normal colon  . COLONOSCOPY WITH PROPOFOL N/A 08/02/2013   MQK:[id number] diverticulosis. next TCS 10 years.  Marland Kitchen  ESOPHAGOGASTRODUODENOSCOPY    09/10/2003   Small hiatal hernia; otherwise normal stomach, normal D1 and D2  . ESOPHAGOGASTRODUODENOSCOPY  08/27/2010   JHE:RDEYCX esophagus.  No varices.  Couple of tiny antral erosions of doubtful clinical significance, otherwise normal stomach, D1 and D2.  . ESOPHAGOGASTRODUODENOSCOPY (EGD) WITH PROPOFOL N/A 08/02/2013   RMR: Portal gastropathy. No explanation for abdominal pain which I believe is more abdominal wall in origin. She has been seen by Dr. Carlis Abbott  over at [facility]. She is up-to-date on cross sectional imaging. She has a pain management physician in [place]. Repeat EGD for varices in 3  years.  . ESOPHAGOGASTRODUODENOSCOPY (EGD) WITH PROPOFOL N/A 03/06/2015   KGY:JEHUDJSH gastric mucosac s/p bx  . ESOPHAGOGASTRODUODENOSCOPY (EGD) WITH PROPOFOL N/A 04/18/2017   normal esophagus, portal hypertensive gastropathy, normal duodenum, no specimens collected. Surveillance in Aug 2020  . ESOPHAGOGASTRODUODENOSCOPY (EGD) WITH PROPOFOL N/A 11/22/2019   Procedure: ESOPHAGOGASTRODUODENOSCOPY (EGD) WITH PROPOFOL;  Surgeon: Daneil Dolin, MD;  [facility];  Service: Endoscopy;  Laterality: N/A;  1:45pm-office rescheduled to 4/1 @ 1:00pm  . ESOPHAGOGASTRODUODENOSCOPY (EGD) WITH PROPOFOL N/A 01/09/2020   Procedure: ESOPHAGOGASTRODUODENOSCOPY (EGD) WITH PROPOFOL;  Surgeon: Rush Landmark Telford Nab., MD;  Location: [place];  Service: Gastroenterology;  Laterality: N/A;  . EUS N/A 01/09/2020   Procedure: ESOPHAGEAL ENDOSCOPIC ULTRASOUND (EUS) RADIAL;  Surgeon: Rush Landmark Telford Nab., MD;  Location: [place];  Service: Gastroenterology;  Laterality: N/A;  . LUMBAR FUSION    . NOSE SURGERY  1970  . PERCUTANEOUS TRANSHEPATIC CHOLANGIOGRAHPY AND BILLIARY DRAINAGE  2002  . REPAIR VAGINAL CUFF N/A 10/25/2015   Procedure: REPAIR VAGINAL LACERATION ;  Surgeon: Jonnie Kind, MD;  [facility];  Service: Gynecology;  Laterality: N/A;  . Roux-en-y-hepatojejunostomy  2003   revision in 2013 for intussusception [facility] Dr. Bailey Mech)  . SPINAL FUSION     C5-C7  . TONSILLECTOMY      Prior to Admission medications   Medication Sig Start Date End Date Taking? Authorizing Provider  acyclovir (ZOVIRAX) 400 MG tablet Take 1 tablet (400 mg total) by mouth 2 (two) times daily. 10/03/19  Yes Jonnie Kind, MD  albuterol (PROVENTIL) (2.5 MG/3ML) 0.083% nebulizer solution Take 2.5 mg by nebulization 2 (two) times daily as needed for wheezing or shortness of breath.    Yes [provider]  ascorbic acid (VITAMIN C) 250 MG CHEW Chew 250 mg by mouth daily.   Yes [provider]  atenolol (TENORMIN) 25 MG tablet Take 25 mg by mouth every morning.   Yes [provider]  brexpiprazole (REXULTI) 1 MG TABS tablet Take 1 mg by mouth daily.   Yes [provider]  Cholecalciferol (VITAMIN D) 50 MCG (2000 UT) CAPS Take 10,000 Units by mouth daily.   Yes [provider]  DULoxetine (CYMBALTA) 60 MG capsule Take 60 mg by mouth daily.   Yes [provider]  EPIPEN 2-PAK 0.3 MG/0.3ML SOAJ injection Inject 0.3 mg into the skin as needed (allergic reactions).  03/04/13  Yes [provider]  etodolac (LODINE) 400 MG tablet Take 400 mg by mouth daily. 07/04/19  Yes [provider]  Flaxseed, Linseed, (FLAXSEED OIL PO) Take 1 capsule by mouth daily.   Yes [provider]  fluticasone (FLONASE) 50 MCG/ACT nasal spray Place 1 spray into both nostrils 2 (two) times daily.   Yes [provider]  gabapentin (NEURONTIN) 300 MG capsule Take 300 mg by mouth 3 (three) times daily. 12/19/19  Yes  [provider]  loratadine (CLARITIN) 10 MG tablet Take 10 mg by mouth daily.   Yes [provider]  omeprazole (PRILOSEC) 20 MG capsule TAKE 2 CAPSULES BY MOUTH TWICE DAILY BEFORE A MEAL. 07/10/20  Yes Mansouraty, Telford Nab., MD  oxyCODONE (OXY IR/ROXICODONE) 5 MG immediate release tablet Take 5 mg by mouth 4 (four) times daily.    Yes [provider]  pravastatin (PRAVACHOL) 40 MG tablet Take 40 mg by mouth daily. 07/04/19  Yes [provider]  PROAIR HFA 108 (90 BASE) MCG/ACT inhaler Inhale 2 puffs into the lungs every 6 (six) hours as needed for wheezing or shortness of breath. 02/21/12  Yes [provider]  tiZANidine (ZANAFLEX) 2 MG tablet Take 4 mg by mouth daily. 08/07/19  Yes [provider]    Allergies as of 08/05/2020 - Review Complete 08/05/2020  Allergen Reaction Noted  . Asa [aspirin] Nausea Only 04/13/2016  . Bee venom Hives 07/10/2015  . Codeine Nausea Only  02/18/2017  . Tylenol [acetaminophen] Nausea And Vomiting and Swelling 07/10/2015    Family History  Problem Relation Age of Onset  . Coronary artery disease Brother   . Cancer Sister        lymphoma  . Cancer Father        brain  . Colon cancer Neg Hx   . Pancreatic cancer Neg Hx   . Stomach cancer Neg Hx   . Esophageal cancer Neg Hx   . Inflammatory bowel disease Neg Hx   . Liver disease Neg Hx   . Rectal cancer Neg Hx     Social History   Socioeconomic History  . Marital status: Single    Spouse name: Not on file  . Number of children: 1  . Years of education: Not on file  . Highest education level: Not on file  Occupational History  . Occupation: disabled    Fish farm manager: UNEMPLOYED  Tobacco Use  . Smoking status: Current Every Day Smoker    Packs/day: 0.25    Years: 33.00    Pack years: 8.25    Types: Cigarettes  . Smokeless tobacco: Never Used  . Tobacco comment: 1 pack every 3 days  Vaping Use  . Vaping Use: Never used  Substance and Sexual Activity  . Alcohol use: Not Currently    Alcohol/week: 0.0 standard drinks    Comment: As of 01/08/20: Last ETOH 3 days ago (2 glasses of wine)  . Drug use: No  . Sexual activity: Yes    Partners: Male    Birth control/protection: Post-menopausal, Condom  Other Topics Concern  . Not on file  Social History Narrative  . Not on file   Social Determinants of Health   Financial Resource Strain: Not on file  Food Insecurity: Not on file  Transportation Needs: Not on file  Physical Activity: Not on file  Stress: Not on file  Social Connections: Not on file  Intimate Partner Violence: Not on file    Review of Systems: See HPI, otherwise negative ROS  Physical Exam: BP 119/77   Pulse 89   Temp (!) 96.8 F (36 C) (Temporal)   Ht 5\' 5"  (1.651 m)   Wt 176 lb 9.6 oz (80.1 kg)   BMI 29.39 kg/m  General:   Alert,  Well-developed, well-nourished, pleasant and cooperative in NAD Neck:  Supple; no masses or  thyromegaly. No significant cervical adenopathy. Lungs:  Clear throughout to auscultation.   No wheezes, crackles, or rhonchi. No acute  distress. Heart:  Regular rate and rhythm; no murmurs, clicks, rubs,  or gallops. Abdomen: Nondistended.  Well-healed extensive surgical scars.  Soft and nontender without appreciable mass organomegaly.  No obvious ascites.   Pulses:  Normal pulses noted. Extremities:  Without clubbing or edema.  Impression/Plan: 69 year old lady with Nash/NASH, HCV cirrhosis-remains well compensated.  Negative duodenal nodule work-up earlier this year.  GERD well-controlled on PPI.  Will see Dr. Rush Landmark in 1 year for repeat upper GI tract evaluation.   Recommendations:Hepatic ultrasound January 2022 for screening  Continue omeprazole 20 mg twice daily for GERD  Appoint with Dr. Rush Landmark next year for surveillance.  Plan for screening colonoscopy 2024  Office visit here in 6 months.  Call if any interim problems.     Notice: This dictation was prepared with Dragon dictation along with smaller phrase technology. Any transcriptional errors that result from this process are unintentional and may not be corrected upon review.

## 2020-08-05 NOTE — Patient Instructions (Signed)
Hepatic ultrasound January 2022 for screening  Continue omeprazole 20 mg twice daily for GERD  Appoint with Dr. Rush Landmark next year for surveillance.  Plan for screening colonoscopy 2024  Office visit here in 6 months.  Call if any interim problems.

## 2020-08-12 ENCOUNTER — Ambulatory Visit (HOSPITAL_COMMUNITY): Payer: Medicare Other

## 2020-09-29 ENCOUNTER — Ambulatory Visit (HOSPITAL_COMMUNITY): Payer: Medicare Other

## 2020-10-04 ENCOUNTER — Other Ambulatory Visit: Payer: Self-pay | Admitting: Gastroenterology

## 2020-10-04 ENCOUNTER — Other Ambulatory Visit: Payer: Self-pay | Admitting: Obstetrics & Gynecology

## 2020-10-06 ENCOUNTER — Ambulatory Visit (HOSPITAL_COMMUNITY): Payer: Medicare Other

## 2020-10-23 ENCOUNTER — Other Ambulatory Visit (HOSPITAL_COMMUNITY): Payer: Self-pay | Admitting: Family Medicine

## 2020-10-23 DIAGNOSIS — M1712 Unilateral primary osteoarthritis, left knee: Secondary | ICD-10-CM

## 2020-10-29 ENCOUNTER — Ambulatory Visit (HOSPITAL_COMMUNITY)
Admission: RE | Admit: 2020-10-29 | Discharge: 2020-10-29 | Disposition: A | Discharge status: Home / Self Care | Payer: Medicare Other | Source: Ambulatory Visit | Attending: Family Medicine | Admitting: Family Medicine

## 2020-10-29 ENCOUNTER — Ambulatory Visit (HOSPITAL_COMMUNITY)
Admission: RE | Admit: 2020-10-29 | Discharge: 2020-10-29 | Disposition: A | Discharge status: Home / Self Care | Payer: Medicare Other | Source: Ambulatory Visit | Attending: Internal Medicine | Admitting: Internal Medicine

## 2020-10-29 DIAGNOSIS — K7460 Unspecified cirrhosis of liver: Secondary | ICD-10-CM | POA: Diagnosis present

## 2020-10-29 DIAGNOSIS — M1712 Unilateral primary osteoarthritis, left knee: Secondary | ICD-10-CM

## 2020-11-03 ENCOUNTER — Other Ambulatory Visit: Payer: Self-pay | Admitting: Gastroenterology

## 2020-11-14 ENCOUNTER — Encounter: Payer: Self-pay | Admitting: Acute Care

## 2020-11-27 ENCOUNTER — Emergency Department (HOSPITAL_COMMUNITY): Payer: 59

## 2020-11-27 ENCOUNTER — Encounter (HOSPITAL_COMMUNITY): Payer: Self-pay

## 2020-11-27 ENCOUNTER — Inpatient Hospital Stay (HOSPITAL_COMMUNITY)
Admission: EM | Admit: 2020-11-27 | Discharge: 2020-11-29 | DRG: 392 | Disposition: A | Discharge status: Home / Self Care | Payer: 59 | Attending: Internal Medicine | Admitting: Internal Medicine

## 2020-11-27 ENCOUNTER — Other Ambulatory Visit: Payer: Self-pay

## 2020-11-27 DIAGNOSIS — M545 Low back pain, unspecified: Secondary | ICD-10-CM | POA: Diagnosis present

## 2020-11-27 DIAGNOSIS — E785 Hyperlipidemia, unspecified: Secondary | ICD-10-CM | POA: Diagnosis present

## 2020-11-27 DIAGNOSIS — Z981 Arthrodesis status: Secondary | ICD-10-CM | POA: Diagnosis not present

## 2020-11-27 DIAGNOSIS — Z20822 Contact with and (suspected) exposure to covid-19: Secondary | ICD-10-CM | POA: Diagnosis present

## 2020-11-27 DIAGNOSIS — Z9842 Cataract extraction status, left eye: Secondary | ICD-10-CM

## 2020-11-27 DIAGNOSIS — Z886 Allergy status to analgesic agent status: Secondary | ICD-10-CM

## 2020-11-27 DIAGNOSIS — Z9049 Acquired absence of other specified parts of digestive tract: Secondary | ICD-10-CM

## 2020-11-27 DIAGNOSIS — R1011 Right upper quadrant pain: Secondary | ICD-10-CM | POA: Diagnosis present

## 2020-11-27 DIAGNOSIS — Z79899 Other long term (current) drug therapy: Secondary | ICD-10-CM

## 2020-11-27 DIAGNOSIS — K5730 Diverticulosis of large intestine without perforation or abscess without bleeding: Secondary | ICD-10-CM | POA: Diagnosis present

## 2020-11-27 DIAGNOSIS — F32A Depression, unspecified: Secondary | ICD-10-CM | POA: Diagnosis present

## 2020-11-27 DIAGNOSIS — G8929 Other chronic pain: Secondary | ICD-10-CM | POA: Diagnosis present

## 2020-11-27 DIAGNOSIS — Z9841 Cataract extraction status, right eye: Secondary | ICD-10-CM

## 2020-11-27 DIAGNOSIS — Z885 Allergy status to narcotic agent status: Secondary | ICD-10-CM

## 2020-11-27 DIAGNOSIS — K219 Gastro-esophageal reflux disease without esophagitis: Secondary | ICD-10-CM | POA: Diagnosis present

## 2020-11-27 DIAGNOSIS — K7460 Unspecified cirrhosis of liver: Secondary | ICD-10-CM | POA: Diagnosis present

## 2020-11-27 DIAGNOSIS — R1084 Generalized abdominal pain: Secondary | ICD-10-CM

## 2020-11-27 DIAGNOSIS — K838 Other specified diseases of biliary tract: Principal | ICD-10-CM | POA: Diagnosis present

## 2020-11-27 DIAGNOSIS — M069 Rheumatoid arthritis, unspecified: Secondary | ICD-10-CM | POA: Diagnosis present

## 2020-11-27 DIAGNOSIS — K2950 Unspecified chronic gastritis without bleeding: Secondary | ICD-10-CM | POA: Diagnosis present

## 2020-11-27 DIAGNOSIS — F17210 Nicotine dependence, cigarettes, uncomplicated: Secondary | ICD-10-CM | POA: Diagnosis present

## 2020-11-27 DIAGNOSIS — Z888 Allergy status to other drugs, medicaments and biological substances status: Secondary | ICD-10-CM

## 2020-11-27 DIAGNOSIS — Z8719 Personal history of other diseases of the digestive system: Secondary | ICD-10-CM

## 2020-11-27 DIAGNOSIS — Z6828 Body mass index (BMI) 28.0-28.9, adult: Secondary | ICD-10-CM

## 2020-11-27 DIAGNOSIS — K2970 Gastritis, unspecified, without bleeding: Secondary | ICD-10-CM | POA: Diagnosis not present

## 2020-11-27 DIAGNOSIS — K317 Polyp of stomach and duodenum: Secondary | ICD-10-CM | POA: Diagnosis present

## 2020-11-27 DIAGNOSIS — Z86010 Personal history of colonic polyps: Secondary | ICD-10-CM | POA: Diagnosis not present

## 2020-11-27 DIAGNOSIS — Z8249 Family history of ischemic heart disease and other diseases of the circulatory system: Secondary | ICD-10-CM | POA: Diagnosis not present

## 2020-11-27 DIAGNOSIS — E669 Obesity, unspecified: Secondary | ICD-10-CM | POA: Diagnosis present

## 2020-11-27 DIAGNOSIS — Z961 Presence of intraocular lens: Secondary | ICD-10-CM | POA: Diagnosis present

## 2020-11-27 DIAGNOSIS — I10 Essential (primary) hypertension: Secondary | ICD-10-CM | POA: Diagnosis present

## 2020-11-27 DIAGNOSIS — Z91030 Bee allergy status: Secondary | ICD-10-CM

## 2020-11-27 DIAGNOSIS — Z21 Asymptomatic human immunodeficiency virus [HIV] infection status: Secondary | ICD-10-CM

## 2020-11-27 LAB — COMPREHENSIVE METABOLIC PANEL
ALT: 24 U/L (ref 0–44)
AST: 25 U/L (ref 15–41)
Albumin: 3.5 g/dL (ref 3.5–5.0)
Alkaline Phosphatase: 94 U/L (ref 38–126)
Anion gap: 14 (ref 5–15)
BUN: 8 mg/dL (ref 8–23)
CO2: 19 mmol/L — ABNORMAL LOW (ref 22–32)
Calcium: 9.1 mg/dL (ref 8.9–10.3)
Chloride: 107 mmol/L (ref 98–111)
Creatinine, Ser: 0.81 mg/dL (ref 0.44–1.00)
GFR, Estimated: >60 mL/min (ref >60)
Glucose, Bld: 128 mg/dL — ABNORMAL HIGH (ref 70–99)
Potassium: 3.7 mmol/L (ref 3.5–5.1)
Sodium: 140 mmol/L (ref 135–145)
Total Bilirubin: 0.5 mg/dL (ref 0.3–1.2)
Total Protein: 6.9 g/dL (ref 6.5–8.1)

## 2020-11-27 LAB — CBC WITH DIFFERENTIAL/PLATELET
Abs Immature Granulocytes: 0.04 10*3/uL (ref 0.00–0.07)
Basophils Absolute: 0.1 10*3/uL (ref 0.0–0.1)
Basophils Relative: 1 %
Eosinophils Absolute: 0.3 10*3/uL (ref 0.0–0.5)
Eosinophils Relative: 4 %
HCT: 42.7 % (ref 36.0–46.0)
Hemoglobin: 13.3 g/dL (ref 12.0–15.0)
Immature Granulocytes: 1 %
Lymphocytes Relative: 34 %
Lymphs Abs: 2.7 10*3/uL (ref 0.7–4.0)
MCH: 28.2 pg (ref 26.0–34.0)
MCHC: 31.1 g/dL (ref 30.0–36.0)
MCV: 90.5 fL (ref 80.0–100.0)
Monocytes Absolute: 0.6 10*3/uL (ref 0.1–1.0)
Monocytes Relative: 8 %
Neutro Abs: 4.2 10*3/uL (ref 1.7–7.7)
Neutrophils Relative %: 52 %
Platelets: 219 10*3/uL (ref 150–400)
RBC: 4.72 MIL/uL (ref 3.87–5.11)
RDW: 16.5 % — ABNORMAL HIGH (ref 11.5–15.5)
WBC: 8 10*3/uL (ref 4.0–10.5)
nRBC: 0 % (ref 0.0–0.2)

## 2020-11-27 LAB — SARS CORONAVIRUS 2 (TAT 6-24 HRS): SARS Coronavirus 2: NEGATIVE

## 2020-11-27 LAB — PROTIME-INR
INR: 1.1 (ref 0.8–1.2)
Prothrombin Time: 13.3 seconds (ref 11.4–15.2)

## 2020-11-27 LAB — LIPASE, BLOOD: Lipase: 42 U/L (ref 11–51)

## 2020-11-27 MED ORDER — ONDANSETRON HCL 4 MG/2ML IJ SOLN: 4.0000 mg | Freq: Four times a day (QID) | INTRAMUSCULAR | Status: DC | PRN

## 2020-11-27 MED ORDER — UMECLIDINIUM-VILANTEROL 62.5-25 MCG/INH IN AEPB
1.0000 | INHALATION_SPRAY | Freq: Every day | RESPIRATORY_TRACT | Status: DC
  Administered 2020-11-28: 1 via RESPIRATORY_TRACT
  Filled 2020-11-27: qty 14

## 2020-11-27 MED ORDER — BREXPIPRAZOLE 1 MG PO TABS
1.0000 mg | ORAL_TABLET | Freq: Every day | ORAL | Status: DC
  Administered 2020-11-29: 1 mg via ORAL
  Filled 2020-11-27 (×5): qty 1

## 2020-11-27 MED ORDER — BUDESON-GLYCOPYRROL-FORMOTEROL 160-9-4.8 MCG/ACT IN AERO: 2.0000 | INHALATION_SPRAY | Freq: Two times a day (BID) | RESPIRATORY_TRACT | Status: DC

## 2020-11-27 MED ORDER — TIZANIDINE HCL 4 MG PO TABS
4.0000 mg | ORAL_TABLET | Freq: Every day | ORAL | Status: DC
  Administered 2020-11-28 – 2020-11-29 (×2): 4 mg via ORAL
  Filled 2020-11-27 (×2): qty 1

## 2020-11-27 MED ORDER — IOHEXOL 300 MG/ML  SOLN
100.0000 mL | Freq: Once | INTRAMUSCULAR | Status: AC | PRN
  Administered 2020-11-27: 100 mL via INTRAVENOUS

## 2020-11-27 MED ORDER — ENOXAPARIN SODIUM 40 MG/0.4ML ~~LOC~~ SOLN
40.0000 mg | SUBCUTANEOUS | Status: DC
  Administered 2020-11-27: 40 mg via SUBCUTANEOUS
  Filled 2020-11-27: qty 0.4

## 2020-11-27 MED ORDER — OXYCODONE HCL 5 MG PO TABS
5.0000 mg | ORAL_TABLET | Freq: Four times a day (QID) | ORAL | Status: DC
  Administered 2020-11-27 – 2020-11-28 (×6): 5 mg via ORAL
  Filled 2020-11-27 (×7): qty 1

## 2020-11-27 MED ORDER — LORATADINE 10 MG PO TABS
10.0000 mg | ORAL_TABLET | Freq: Every day | ORAL | Status: DC
  Administered 2020-11-28 – 2020-11-29 (×2): 10 mg via ORAL
  Filled 2020-11-27 (×2): qty 1

## 2020-11-27 MED ORDER — FLUTICASONE PROPIONATE 50 MCG/ACT NA SUSP
1.0000 | Freq: Two times a day (BID) | NASAL | Status: DC
  Administered 2020-11-27 – 2020-11-29 (×5): 1 via NASAL
  Filled 2020-11-27: qty 16

## 2020-11-27 MED ORDER — ALBUTEROL SULFATE HFA 108 (90 BASE) MCG/ACT IN AERS
2.0000 | INHALATION_SPRAY | Freq: Once | RESPIRATORY_TRACT | Status: AC
  Administered 2020-11-27: 2 via RESPIRATORY_TRACT
  Filled 2020-11-27: qty 6.7

## 2020-11-27 MED ORDER — HYDROMORPHONE HCL 1 MG/ML IJ SOLN
1.0000 mg | Freq: Once | INTRAMUSCULAR | Status: AC
  Administered 2020-11-27: 1 mg via INTRAVENOUS
  Filled 2020-11-27: qty 1

## 2020-11-27 MED ORDER — DULOXETINE HCL 60 MG PO CPEP
60.0000 mg | ORAL_CAPSULE | Freq: Every day | ORAL | Status: DC
  Administered 2020-11-28 – 2020-11-29 (×2): 60 mg via ORAL
  Filled 2020-11-27 (×2): qty 1

## 2020-11-27 MED ORDER — BUDESONIDE 0.25 MG/2ML IN SUSP
0.2500 mg | Freq: Two times a day (BID) | RESPIRATORY_TRACT | Status: DC
  Administered 2020-11-27 – 2020-11-29 (×4): 0.25 mg via RESPIRATORY_TRACT
  Filled 2020-11-27 (×4): qty 2

## 2020-11-27 MED ORDER — ONDANSETRON HCL 4 MG PO TABS: 4.0000 mg | ORAL_TABLET | Freq: Four times a day (QID) | ORAL | Status: DC | PRN

## 2020-11-27 MED ORDER — PANTOPRAZOLE SODIUM 40 MG PO TBEC
40.0000 mg | DELAYED_RELEASE_TABLET | Freq: Every day | ORAL | Status: DC
  Administered 2020-11-28: 40 mg via ORAL
  Filled 2020-11-27: qty 1

## 2020-11-27 MED ORDER — LACTATED RINGERS IV SOLN: INTRAVENOUS | Status: DC

## 2020-11-27 MED ORDER — GABAPENTIN 300 MG PO CAPS
300.0000 mg | ORAL_CAPSULE | Freq: Three times a day (TID) | ORAL | Status: DC
  Administered 2020-11-27 – 2020-11-29 (×6): 300 mg via ORAL
  Filled 2020-11-27 (×6): qty 1

## 2020-11-27 MED ORDER — HYDROMORPHONE HCL 1 MG/ML IJ SOLN
0.5000 mg | INTRAMUSCULAR | Status: DC | PRN
  Administered 2020-11-29: 0.5 mg via INTRAVENOUS
  Filled 2020-11-27: qty 1

## 2020-11-27 MED ORDER — ALBUTEROL SULFATE (2.5 MG/3ML) 0.083% IN NEBU
2.5000 mg | INHALATION_SOLUTION | Freq: Two times a day (BID) | RESPIRATORY_TRACT | Status: DC | PRN
  Administered 2020-11-28 (×2): 2.5 mg via RESPIRATORY_TRACT
  Filled 2020-11-27 (×2): qty 3

## 2020-11-27 MED ORDER — ATENOLOL 25 MG PO TABS
25.0000 mg | ORAL_TABLET | Freq: Every morning | ORAL | Status: DC
  Administered 2020-11-28 – 2020-11-29 (×2): 25 mg via ORAL
  Filled 2020-11-27 (×2): qty 1

## 2020-11-27 MED ORDER — NICOTINE 7 MG/24HR TD PT24
7.0000 mg | MEDICATED_PATCH | Freq: Every day | TRANSDERMAL | Status: DC
  Administered 2020-11-28 – 2020-11-29 (×2): 7 mg via TRANSDERMAL
  Filled 2020-11-27 (×2): qty 1

## 2020-11-27 NOTE — Consult Note (Signed)
Referring Provider: Rodena Goldmann, DO Primary Care Physician:  Lucia Gaskins, MD Primary Gastroenterologist:  Garfield Cornea, MD  Reason for Consultation:  Pneumobilia, ruq pain  HPI: Sabrina Wyatt is a 70 y.o. 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  Patient states for the last 1 months she has not felt real well.  Not sure if his depression or something else.  She has a home health aide now.  States overall has had some lower extremity edema.  Feels like abdomen has been more distended.  2 days ago she woke up with worsening right upper quadrant pain, like a bad cramp.  Dry heaved but nothing came up.  Bowel movements unremarkable.  No blood in stool or melena.  No heartburn or dysphagia.  States she takes Naprosyn on a regular basis, Mobic just started by PCP.  In the ED: Vital signs stable.  Glucose 128, creatinine 0.81, LFTs normal.  Lipase normal.  CBC normal.  INR 1.1.  Right upper quadrant ultrasound showed common bile duct of 8 mm, liver appearance consistent with a degree of hepatic cirrhosis, no focal liver lesions.  CT abdomen pelvis with contrast: Pneumobilia with variable perfusion in the right hepatic lobe, seen in this patient with cirrhosis post biliary reconstruction with hepatic to enteric anastomosis.  Pneumobilia nonspecific.  Portal vein patent. Mild segmental colonic thickening in the area of the sigmoid colon.  Consider colonoscopy for further evaluation given presence of potential  eccentric thickening left lateral wall.  No ascites.  Tiny 5 mm pulmonary nodule right middle lobe not imaged on previous evaluations.     EGD April 2021: Normal esophagus, portal hypertensive gastropathy with scarring and inflammation in the antrum, retained gastric contents.  Duodenal nodule noted.  Surgical pathology found gastric biopsies to be erosion with reactive changes and hyperemia with differentials including portal gastropathy, negative for H. pylori.  Duodenal biopsy found erosion with pyloric metaplasia with no features of sprue or carcinoma.  EGD/EUS Jan 09, 2020 by Dr. Rush Landmark: EGD Impression: - No gross lesions in esophagus. - Portal hypertensive gastropathy. - Gastritis of antrum/prepylorus. Biopsied (chronic active gastritis with surface ulceration, no H. pylori). - Nodular mucosa in the second portion of the duodenum. Biopsied (benign). EUS Impression: - Wall thickening was seen in the second portion of the duodenum. It appeared to primarily be within the luminal interface/superficial mucosa (Layer 1), deep mucosa (Layer 2) and submucosa (Layer 3). Layer 4 seemed to be non-contiguous with this area. Ductular structures were noted in the region. - The pancreatic duct in the pancreatic head, genu of the pancreas, body of the pancreas and tail of the pancreas. - There was no sign of significant pathology in the ampulla. - CHD region is not visualized - query previous surgical intervention. - No malignant-appearing lymph nodes were visualized in the celiac region (level 20), peripancreatic region and porta hepatis region.  Colonoscopy December 2014: Colonic diverticulosis.  Prior to Admission medications   Medication Sig Start Date End Date Taking? Authorizing Provider  acyclovir (ZOVIRAX) 400 MG tablet TAKE 1 TABLET BY MOUTH TWICE DAILY. Patient  taking differently: Take 400 mg by mouth 2 (two) times daily. 10/06/20  Yes Florian Buff, MD  albuterol (PROVENTIL) (2.5  MG/3ML) 0.083% nebulizer solution Take 2.5 mg by nebulization 2 (two) times daily as needed for wheezing or shortness of breath.    Yes [provider]  ascorbic acid (VITAMIN C) 250 MG CHEW Chew 250 mg by mouth daily.   Yes [provider]  atenolol (TENORMIN) 25 MG tablet Take 25 mg by mouth every morning.   Yes [provider]  brexpiprazole (REXULTI) 1 MG TABS tablet Take 1 mg by mouth daily.   Yes [provider]  BREZTRI AEROSPHERE 160-9-4.8 MCG/ACT AERO Inhale 2 puffs into the lungs 2 (two) times daily. 11/13/20  Yes [provider]  Cholecalciferol (VITAMIN D) 50 MCG (2000 UT) CAPS Take 10,000 Units by mouth daily.   Yes [provider]  DULoxetine (CYMBALTA) 60 MG capsule Take 60 mg by mouth daily.   Yes [provider]       Yes [provider]  fluticasone (FLONASE) 50 MCG/ACT nasal spray Place 1 spray into both nostrils 2 (two) times daily.   Yes [provider]  gabapentin (NEURONTIN) 300 MG capsule Take 300 mg by mouth 3 (three) times daily. 12/19/19  Yes [provider]  loratadine (CLARITIN) 10 MG tablet Take 10 mg by mouth daily.   Yes [provider]  meloxicam (MOBIC) 15 MG tablet Take 1 tablet by mouth daily. 10/31/20  Yes [provider]  naproxen (NAPROSYN) 500 MG tablet Take 500 mg by mouth 2 (two) times daily. 11/03/20  Yes [provider]  omeprazole (PRILOSEC) 20 MG capsule TAKE 2 CAPSULES BY MOUTH TWICE DAILY BEFORE A MEAL. Patient taking differently: Take 40 mg by mouth 2 (two) times daily before a meal. 11/03/20  Yes Mansouraty, Telford Nab., MD  oxyCODONE (OXY IR/ROXICODONE) 5 MG immediate release tablet Take 5 mg by mouth 4 (four) times daily.    Yes [provider]  pravastatin (PRAVACHOL) 40 MG tablet Take 40 mg by mouth daily. 07/04/19  Yes [provider]  PROAIR HFA 108 (90 BASE) MCG/ACT inhaler Inhale 2 puffs into the lungs every 6  (six) hours as needed for wheezing or shortness of breath. 02/21/12  Yes [provider]  tiZANidine (ZANAFLEX) 2 MG tablet Take 4 mg by mouth daily. 08/07/19  Yes [provider]  EPIPEN 2-PAK 0.3 MG/0.3ML SOAJ injection Inject 0.3 mg into the skin as needed (allergic reactions).  03/04/13   [provider]    Current Facility-Administered Medications  Medication Dose Route Frequency Provider Last Rate Last Admin  . albuterol (PROVENTIL) (2.5 MG/3ML) 0.083% nebulizer solution 2.5 mg  2.5 mg Nebulization BID PRN Manuella Ghazi, Pratik D, DO      . atenolol (TENORMIN) tablet 25 mg  25 mg Oral q morning Shah, Pratik D, DO      . brexpiprazole (REXULTI) tablet 1 mg  1 mg Oral Daily Shah, Pratik D, DO      . budesonide (PULMICORT) nebulizer solution 0.25 mg  0.25 mg Nebulization BID Manuella Ghazi, Pratik D, DO      . DULoxetine (CYMBALTA) DR capsule 60 mg  60 mg Oral Daily Shah, Pratik D, DO      . enoxaparin (LOVENOX) injection 40 mg  40 mg Subcutaneous Q24H Shah, Pratik D, DO   40 mg at 11/27/20 1754  . fluticasone (FLONASE) 50 MCG/ACT nasal spray 1 spray  1 spray Each Nare BID  Manuella Ghazi, Pratik D, DO   1 spray at 11/27/20 1754  . gabapentin (NEURONTIN) capsule 300 mg  300 mg Oral TID Manuella Ghazi, Pratik D, DO   300 mg at 11/27/20 1753  . HYDROmorphone (DILAUDID) injection 0.5-1 mg  0.5-1 mg Intravenous Q2H PRN Manuella Ghazi, Pratik D, DO      . lactated ringers infusion   Intravenous Continuous Heath Lark D, DO 75 mL/hr at 11/27/20 1806 Infusion Verify at 11/27/20 1806  . loratadine (CLARITIN) tablet 10 mg  10 mg Oral Daily Shah, Pratik D, DO      . nicotine (NICODERM CQ - dosed in mg/24 hr) patch 7 mg  7 mg Transdermal Daily Manuella Ghazi, Pratik D, DO      . ondansetron (ZOFRAN) tablet 4 mg  4 mg Oral Q6H PRN Manuella Ghazi, Pratik D, DO       Or  . ondansetron (ZOFRAN) injection 4 mg  4 mg Intravenous Q6H PRN Manuella Ghazi, Pratik D, DO      . oxyCODONE (Oxy IR/ROXICODONE) immediate release tablet 5 mg  5 mg Oral QID Manuella Ghazi, Pratik D,  DO   5 mg at 11/27/20 1754  . pantoprazole (PROTONIX) EC tablet 40 mg  40 mg Oral Daily Manuella Ghazi, Pratik D, DO      . tiZANidine (ZANAFLEX) tablet 4 mg  4 mg Oral Daily Manuella Ghazi, Pratik D, DO      . umeclidinium-vilanterol (ANORO ELLIPTA) 62.5-25 MCG/INH 1 puff  1 puff Inhalation Daily Manuella Ghazi, Pratik D, DO        Allergies as of 11/27/2020 - Review Complete 11/27/2020  Allergen Reaction Noted  . Asa [aspirin] Nausea Only 04/13/2016  . Bee venom Hives 07/10/2015  . Codeine Nausea Only 02/18/2017  . Tylenol [acetaminophen] Nausea And Vomiting and Swelling 07/10/2015    Past Medical History:  Diagnosis Date  . Abdominal wall pain    chronic  . Anemia   . Arthritis    rheumatoid  . Biliary stone   . Chronic abdominal pain   . Chronic flank pain   . Chronic low back pain   . Cirrhosis, hepatitis C    U/S on 03/09/16= no HCC, pt has been vaccinated for Hep A and Hep B. s/p treatment of HCV with eradication  . Depression   . Gall stones, common bile duct 2002  . GERD (gastroesophageal reflux disease)   . Gonorrhea   . Hepatitis     successfully treated for Hep C; eradicated  . Hypertension   . Intracranial hemorrhage ([place]) 1998   left thalmic  . Intussusception ([place]) 04/2012  . Polyp of duodenum    Duodenal polyp  . Polysubstance abuse ([place])    HX of  . PTSD (post-traumatic stress disorder)   . Pulmonary nodules   . S/P colonoscopy 2005   Dr Moss Mc polyp removed, otherwise normal  . S/P endoscopy 08/27/10   antral erosions, otherwise normal, due egd 08/2012 to screen for varices  . Seizures ([place]) 1998   with brain bleed x1. None since then and on no meds  . Shortness of breath   . Tobacco abuse     Past Surgical History:  Procedure Laterality Date  . biliary stone removal  2015  . BIOPSY N/A 03/06/2015   Procedure: BIOPSY;  Surgeon: Daneil Dolin, MD;  Location: [facility];  Service: Endoscopy;  Laterality: N/A;  . BIOPSY  11/22/2019   Procedure: BIOPSY;  Surgeon: Daneil Dolin, MD;   [facility];  Service: Endoscopy;;  .  BIOPSY  01/09/2020   Procedure: BIOPSY;  Surgeon: Rush Landmark Telford Nab., MD;  Location: [place];  Service: Gastroenterology;;  . Wilmon Pali RELEASE Bilateral 12/2010  . CATARACT EXTRACTION W/PHACO Right 01/15/2016   Procedure: CATARACT EXTRACTION PHACO AND INTRAOCULAR LENS PLACEMENT RIGHT EYE CDE=5.88;  Surgeon: Tonny Branch, MD;  Location: [facility];  Service: Ophthalmology;  Laterality: Right;  . CATARACT EXTRACTION W/PHACO Left 02/12/2016   Procedure: CATARACT EXTRACTION PHACO AND INTRAOCULAR LENS PLACEMENT LEFT EYE; CDE:  6.02;  Surgeon: Tonny Branch, MD;  Location: [facility];  Service: Ophthalmology;  Laterality: Left;  . CHOLECYSTECTOMY  2002  . COLONOSCOPY    09/10/2003   diminutive polyp in the rectum cold biopsied/removed/ Normal colon  . COLONOSCOPY WITH PROPOFOL N/A 08/02/2013   VPX:TGGYIRS diverticulosis. next TCS 10 years.  . ESOPHAGOGASTRODUODENOSCOPY    09/10/2003   Small hiatal hernia; otherwise normal stomach, normal D1 and D2  . ESOPHAGOGASTRODUODENOSCOPY  08/27/2010   WNI:OEVOJJ esophagus.  No varices.  Couple of tiny antral erosions of doubtful clinical significance, otherwise normal stomach, D1 and D2.  . ESOPHAGOGASTRODUODENOSCOPY (EGD) WITH PROPOFOL N/A 08/02/2013   RMR: Portal gastropathy. No explanation for abdominal pain which I believe is more abdominal wall in origin. She has been seen by Dr. Carlis Abbott  over at [facility]. She is up-to-date on cross sectional imaging. She has a pain management physician in [place]. Repeat EGD for varices in 3 years.  . ESOPHAGOGASTRODUODENOSCOPY (EGD) WITH PROPOFOL N/A 03/06/2015   KKX:FGHWEXHB gastric mucosac s/p bx  . ESOPHAGOGASTRODUODENOSCOPY (EGD) WITH PROPOFOL N/A 04/18/2017   normal esophagus, portal hypertensive gastropathy, normal duodenum, no specimens collected. Surveillance in Aug 2020  . ESOPHAGOGASTRODUODENOSCOPY (EGD) WITH PROPOFOL N/A 11/22/2019   Procedure:  ESOPHAGOGASTRODUODENOSCOPY (EGD) WITH PROPOFOL;  Surgeon: Daneil Dolin, MD;  [facility];  Service: Endoscopy;  Laterality: N/A;  1:45pm-office rescheduled to 4/1 @ 1:00pm  . ESOPHAGOGASTRODUODENOSCOPY (EGD) WITH PROPOFOL N/A 01/09/2020   Procedure: ESOPHAGOGASTRODUODENOSCOPY (EGD) WITH PROPOFOL;  Surgeon: Rush Landmark Telford Nab., MD;  Location: [place];  Service: Gastroenterology;  Laterality: N/A;  . EUS N/A 01/09/2020   Procedure: ESOPHAGEAL ENDOSCOPIC ULTRASOUND (EUS) RADIAL;  Surgeon: Rush Landmark Telford Nab., MD;  Location: [place];  Service: Gastroenterology;  Laterality: N/A;  . LUMBAR FUSION    . NOSE SURGERY  1970  . PERCUTANEOUS TRANSHEPATIC CHOLANGIOGRAHPY AND BILLIARY DRAINAGE  2002  . REPAIR VAGINAL CUFF N/A 10/25/2015   Procedure: REPAIR VAGINAL LACERATION ;  Surgeon: Jonnie Kind, MD;  Location: [facility];  Service: Gynecology;  Laterality: N/A;  . Roux-en-y-hepatojejunostomy  2003   revision in 2013 for intussusception [facility] Dr. Bailey Mech)  . SPINAL FUSION     C5-C7  . TONSILLECTOMY      Family History  Problem Relation Age of Onset  . Coronary artery disease Brother   . Cancer Sister        lymphoma  . Cancer Father        brain  . Colon cancer Neg Hx   . Pancreatic cancer Neg Hx   . Stomach cancer Neg Hx   . Esophageal cancer Neg Hx   . Inflammatory bowel disease Neg Hx   . Liver disease Neg Hx   . Rectal cancer Neg Hx     Social History   Socioeconomic History  . Marital status: Single    Spouse name: Not on file  . Number of children: 1  . Years of education: Not on file  . Highest education level: Not on  file  Occupational History  . Occupation: disabled    Fish farm manager: UNEMPLOYED  Tobacco Use  . Smoking status: Current Every Day Smoker    Packs/day: 0.25    Years: 33.00    Pack years: 8.25    Types: Cigarettes  . Smokeless tobacco: Never Used  . Tobacco comment: 1 pack every 3 days  Vaping Use  . Vaping Use: Never used   Substance and Sexual Activity  . Alcohol use: Yes    Alcohol/week: 0.0 standard drinks    Comment: As of 11/27/2020, 2 glasses of red wine per week  . Drug use: No  . Sexual activity: Yes    Partners: Male    Birth control/protection: Post-menopausal, Condom  Other Topics Concern  . Not on file  Social History Narrative  . Not on file   Social Determinants of Health   Financial Resource Strain: Not on file  Food Insecurity: Not on file  Transportation Needs: Not on file  Physical Activity: Not on file  Stress: Not on file  Social Connections: Not on file  Intimate Partner Violence: Not on file     ROS:  General: Negative for anorexia, weight loss, fever, chills, fatigue, weakness.  Overall just does not feel well over the past month Eyes: Negative for vision changes.  ENT: Negative for hoarseness, difficulty swallowing , nasal congestion. CV: Negative for chest pain, angina, palpitations, dyspnea on exertion, positive peripheral edema.  Respiratory: Negative for dyspnea at rest, dyspnea on exertion, cough, sputum, wheezing.  GI: See history of present illness. GU:  Negative for dysuria, hematuria, urinary incontinence, urinary frequency, nocturnal urination.  MS: Negative for joint pain, low back pain.  Derm: Negative for rash or itching.  Neuro: Negative for weakness, abnormal sensation, seizure, frequent headaches, memory loss, confusion.  Psych: Negative for anxiety, depression, suicidal ideation, hallucinations.  Endo: Negative for unusual weight change.  Heme: Negative for bruising or bleeding. Allergy: Negative for rash or hives.       Physical Examination: Vital signs in last 24 hours: Temp:  [98.4 F (36.9 C)-98.7 F (37.1 C)] 98.7 F (37.1 C) (04/07 1653) Pulse Rate:  [72-84] 72 (04/07 1653) Resp:  [14-29] 20 (04/07 1653) BP: (109-139)/(68-84) 113/68 (04/07 1653) SpO2:  [91 %-96 %] 96 % (04/07 1653) Weight:  [78.5 kg-81.2 kg] 81.2 kg (04/07 1653) Last BM  Date: 11/27/20  General: Well-nourished, well-developed in no acute distress.  Head: Normocephalic, atraumatic.   Eyes: Conjunctiva pink, no icterus. Mouth: masked Neck: Supple without thyromegaly, masses, or lymphadenopathy.  Lungs: Clear to auscultation bilaterally.  Heart: Regular rate and rhythm, no murmurs rubs or gallops.  Abdomen: Bowel sounds are normal, mild ruq tenderness, nondistended, no hepatosplenomegaly or masses, no abdominal bruits or    hernia , no rebound or guarding.   Rectal: not performed Extremities: Trace lower extremity edema.  No clubbing, deformity.  Neuro: Alert and oriented x 4 , grossly normal neurologically.  Skin: Warm and dry, no rash or jaundice.   Psych: Alert and cooperative, normal mood and affect.        Intake/Output from previous day: No intake/output data recorded. Intake/Output this shift: Total I/O In: 12.5 [I.V.:12.5] Out: -   Lab Results: CBC Recent Labs    11/27/20 1133  WBC 8.0  HGB 13.3  HCT 42.7  MCV 90.5  PLT 219   BMET Recent Labs    11/27/20 1133  NA 140  K 3.7  CL 107  CO2 19*  GLUCOSE 128*  BUN 8  CREATININE 0.81  CALCIUM 9.1   LFT Recent Labs    11/27/20 1133  BILITOT 0.5  ALKPHOS 94  AST 25  ALT 24  PROT 6.9  ALBUMIN 3.5    Lipase Recent Labs    11/27/20 1133  LIPASE 42    PT/INR Recent Labs    11/27/20 1133  LABPROT 13.3  INR 1.1      Imaging Studies: CT Abdomen Pelvis W Contrast  Result Date: 11/27/2020 CLINICAL DATA:  History of cholecystectomy now with RIGHT upper quadrant pain. Equivocal ultrasound findings. EXAM: CT ABDOMEN AND PELVIS WITH CONTRAST TECHNIQUE: Multidetector CT imaging of the abdomen and pelvis was performed using the standard protocol following bolus administration of intravenous contrast. CONTRAST:  17mL OMNIPAQUE IOHEXOL 300 MG/ML  SOLN COMPARISON:  February 16, 2019 FINDINGS: Lower chest: Tiny pulmonary nodule at the periphery of the RIGHT middle lobe 5 mm (image  2, series 4) not imaged on previous evaluations. No effusion. No consolidation. Hepatobiliary: Hepatic steatosis and signs of cirrhosis with lobular hepatic contours, RIGHT lobe atrophy and LEFT lobe hypertrophy. Portal vein is patent. Diminutive to the small RIGHT lobe. Mild variable perfusion throughout the posterior division of the RIGHT hepatic lobe, slightly increased enhancement as compared to the remainder of the liver. Signs of pneumobilia. No discrete lesion on venous phase imaging in the liver. No gross biliary duct distension but with pneumobilia following biliary reconstruction with hepatic to enteric anastomosis about the porta hepatis. Pancreas: Normal, without mass, inflammation or ductal dilatation. Spleen: Mildly lobular contour the spleen with mild splenic enlargement, top-normal size. Adrenals/Urinary Tract: Adrenal glands are normal. Symmetric renal enhancement. No hydronephrosis. Smooth contour of the collapsed urinary bladder. Stomach/Bowel: Stomach under distended limiting assessment. No acute gastrointestinal process. The appendix is normal. Diverticular disease of the sigmoid colon. Mild segmental colonic thickening in the area of the sigmoid colon on image 64, question of eccentric thickening along the LEFT lateral wall. Vascular/Lymphatic: Calcified and noncalcified atheromatous plaque of the abdominal aorta. No aneurysmal dilation. There is no gastrohepatic or hepatoduodenal ligament lymphadenopathy. No retroperitoneal or mesenteric lymphadenopathy. Smooth contour of the IVC. No pelvic sidewall lymphadenopathy. Reproductive: Unremarkable Other: Remote postoperative changes and RIGHT subcostal region likely related to prior cholecystectomy. Small periumbilical hernia containing fat. Thinning of the rectus musculature following remote surgery on the RIGHT. No ascites. Musculoskeletal: No acute bone finding. No destructive bone process. Spinal and hip degenerative changes. Signs of L5-S1  spinal fusion with similar appearance. IMPRESSION: 1. Pneumobilia with variable perfusion in the RIGHT hepatic lobe, seen in this patient with cirrhosis post biliary reconstruction with hepatic to enteric anastomosis. Pneumobilia is nonspecific. However, given hepatic to enteric anastomosis would correlate with any signs of cholangitis. 2. Hepatic steatosis and morphologic features of cirrhosis without gross evidence of portal hypertension, top-normal size spleen. 3. Mild segmental colonic thickening in the area of the sigmoid colon, question of eccentric thickening along the LEFT lateral wall. Diverticular disease with sequela of prior inflammation or ongoing low level diverticulitis is considered. Given the presence of potential eccentric thickening would suggest correlation with recent colonoscopy if available or with follow-up colonoscopy. 4. Tiny 5 mm pulmonary nodule at the periphery of the RIGHT middle lobe not imaged on previous evaluations. 5. Aortic atherosclerosis. Aortic Atherosclerosis (ICD10-I70.0). Electronically Signed   By: Zetta Bills M.D.   On: 11/27/2020 13:30   DG Chest Port 1 View  Result Date: 11/27/2020 CLINICAL DATA:  Shortness of breath EXAM: PORTABLE CHEST 1 VIEW COMPARISON:  February 25, 2018 FINDINGS: Lungs are clear. Heart size and pulmonary vascularity are normal. No adenopathy. Postoperative change noted in the lower cervical spine. IMPRESSION: Lungs clear.  Cardiac silhouette normal. Electronically Signed   By: Lowella Grip III M.D.   On: 11/27/2020 11:32   DG Knee Complete 4 Views Left  Result Date: 10/29/2020 CLINICAL DATA:  Osteoarthritis of left knee. Left knee pain. History of surgery for patellar relocation and tendon repair. EXAM: LEFT KNEE - COMPLETE 4+ VIEW COMPARISON:  Left knee radiograph 08/18/2018 FINDINGS: Mild medial compartment joint space narrowing with peripheral spurring. There may be an osteochondral defect of the medial femoral condyle. Mild  patellofemoral spurring. Surgical anchor in the region of the tibial portion of patellar tendon insertion. No fracture. No evidence of destructive bone lesion. No joint effusion. Mild soft tissue edema anteriorly. IMPRESSION: 1. Mild osteoarthritis most prominent in the medial compartment. 2. Possible osteochondral defect of the medial femoral condyle. 3. Postsurgical change at the tibial portion of the patellar tendon insertion. 4. Mild soft tissue edema anteriorly. Electronically Signed   By: Keith Rake M.D.   On: 10/29/2020 23:40   US Abdomen Limited RUQ (LIVER/GB)  Result Date: 11/27/2020 CLINICAL DATA:  Upper abdominal pain EXAM: ULTRASOUND ABDOMEN LIMITED RIGHT UPPER QUADRANT COMPARISON:  Ultrasound right upper quadrant October 29, 2020 FINDINGS: Gallbladder: Surgically absent. Common bile duct: Diameter: 8 mm which may be within normal limits for post cholecystectomy state. No intrahepatic or extrahepatic biliary duct dilatation evident. Liver: No focal lesion identified. Liver echogenicity increased diffusely. Equivocal nodularity to the liver contour. Portal vein is patent on color Doppler imaging with normal direction of blood flow towards the liver. Other: None. IMPRESSION: Diffuse increased liver echogenicity with equivocal liver contour nodularity. Appearance is consistent with a degree of hepatic cirrhosis and potential underlying hepatic steatosis. No focal liver lesions evident. Note that the sensitivity of ultrasound for detection of focal liver lesions is diminished significantly in this circumstance. Gallbladder absent. Electronically Signed   By: Lowella Grip III M.D.   On: 11/27/2020 11:34   US ABDOMEN LIMITED RUQ (LIVER/GB)  Result Date: 10/29/2020 CLINICAL DATA:  Cirrhosis, screening EXAM: ULTRASOUND ABDOMEN LIMITED RIGHT UPPER QUADRANT COMPARISON:  MRI abdomen January 30, 2020 and abdominal ultrasound Jan 18, 2020 FINDINGS: Gallbladder: Surgically absent Common bile duct: Diameter:  5 mm Liver: No focal lesion identified. Nodular parenchymal contour with coarsened hepatic echotexture and increased parenchymal echogenicity. Portal vein is patent on color Doppler imaging with normal direction of blood flow towards the liver. Other: None. IMPRESSION: 1. Cirrhotic morphology of the liver without focal lesion identified. 2. Status post cholecystectomy. 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  CT abdomen pelvis this admission showed changes of cirrhosis, pneumobilia with prior biliary reconstruction with hepatic to enteric anastomosis felt to be nonspecific, mild segmental colonic thickening in the area of the sigmoid colon, question of the centric thickening along the left lateral wall.  LFTs normal.  White blood cell count normal.  Cholangitis unlikely.  Of note she has had pneumobilia  seen on prior CTs dating back for several years.  Given her anatomy this is not surprising.  Acute on chronic right upper quadrant pain with unclear etiology at this time.  She is taking NSAIDs regularly along with PPI therapy.  May be playing a role in her symptoms.  May benefit from upper endoscopy.  Cirrhosis: Has been well compensated.  No evidence of ascites on CT.  Meld Na of 7.   Plan: 1. Clear liquid diet. 2. N.p.o. after midnight. 3. Supportive measures. 4. Repeat CBC, LFTs in the morning. 5. Reassess in the morning.  Consider upper endoscopy if persisting symptoms.     We would like to thank you for the opportunity to participate in the care of Sabrina Wyatt.  Laureen Ochs. Bernarda Caffey [facility] [phone] 4/7/20226:16 PM

## 2020-11-27 NOTE — Plan of Care (Signed)

## 2020-11-27 NOTE — ED Triage Notes (Addendum)
Pt presents to ED with complaints of recurrent right sided abdominal pain and dry heaves started again this morning causing her to be short of breath.

## 2020-11-27 NOTE — ED Provider Notes (Signed)
[facility] Provider Note   CSN: [id number] Arrival date & time: 11/27/20  1050     History Chief Complaint  Patient presents with  . Abdominal Pain    Sabrina Wyatt is a 70 y.o. female with PMHx HTN and chronic abdominal pain who presents to the ED via EMS with complaints of acute on chronic RUQ abdominal pain that began 3-4 days ago. Pt also complains of dry heaving and shortness of breath. She reports history of chronic RUQ abdominal pain since 2003 after having her gallbladder taken out. Pt reports that her pain worsened Monday and she has never felt pain like this before. She takes 5 mg Oxycodone at home for pain; last took at 2 AM this morning without relief. She also complains of chills and clamminess. No chest pain however she does state that she feels like her liver is enlarged and pressing on her lungs making it hard to breathe. Last normal bowel movement this AM. No other complaints at this time.   Per chart review: Pt follows with Dr. Gala [facility] regarding her chronic abdominal pain. She has a hx of NASH/ASH and HCV Cirrhosis. She had a hepatic ultrasound 10/29/20: IMPRESSION: 1. Cirrhotic morphology of the liver without focal lesion identified. 2. Status post cholecystectomy.   The history is provided by the patient, the EMS personnel and medical records.       Past Medical History:  Diagnosis Date  . Abdominal wall pain    chronic  . Anemia   . Arthritis    rheumatoid  . Biliary stone   . Chronic abdominal pain   . Chronic flank pain   . Chronic low back pain   . Cirrhosis, hepatitis C    U/S on 03/09/16= no HCC, pt has been vaccinated for Hep A and Hep B. s/p treatment of HCV with eradication  . Depression   . Gall stones, common bile duct 2002  . GERD (gastroesophageal reflux disease)   . Gonorrhea   . Hepatitis     successfully treated for Hep C; eradicated  . Hypertension   . 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 Intussusception ([place]) 04/2012  . Polyp of duodenum    Duodenal polyp  . Polysubstance abuse ([place])    HX of  . PTSD (post-traumatic stress disorder)   . Pulmonary nodules   . S/P colonoscopy 2005   Dr Moss Mc polyp removed, otherwise normal  . S/P endoscopy 08/27/10   antral erosions, otherwise normal, due egd 08/2012 to screen for varices  . Seizures ([place]) 1998   with brain bleed x1. None since then and on no meds  . Shortness of breath   . Tobacco abuse     Patient Active Problem List   Diagnosis Date Noted  . Pneumobilia 11/27/2020  . Duodenal nodule 12/14/2019  . Generalized abdominal pain 12/14/2019  . Portal hypertension ([place]) 07/05/2019  . Colicky LLQ abdominal pain 01/08/2019  . 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 Screening for STD (sexually transmitted disease) 12/19/2015  . Vaginal laceration 10/25/2015  . Acute sinusitis with symptoms > 10 days 08/04/2015  . Epigastric mass 04/16/2015  . Abdominal swelling 04/16/2015  . Mucosal abnormality of stomach   . Nausea without vomiting 02/17/2015  . Loss of weight 02/17/2015  . Chronic folliculitis 09/47/0962  . 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 Insomnia 03/12/2014  . Folliculitis 83/66/2947  . Hemobilia 12/01/2013  . Melena 12/01/2013  . 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  Biliary drain displacement 11/28/2013  . Stricture of bile duct 11/22/2013  . Cirrhosis of liver without ascites ([place]) 11/15/2013  . Lower extremity edema 11/15/2013  . Abdominal pain, chronic, epigastric 07/10/2013  . Anorexia nervosa 07/10/2013  . 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 Chronic bronchitis ([place]) 06/09/2012  . H/O degenerative disc disease 06/09/2012  . History of gastroesophageal reflux (GERD) 06/09/2012  . Hypertension 06/09/2012  . Chronic pain 05/22/2012  . Hepatitis C 05/22/2012  . Muscle  weakness (generalized) 12/28/2011  . CARPAL TUNNEL SYNDROME 10/16/2008  . ABDOMINAL BLOATING 10/16/2008  . Alcohol abuse 04/25/2008  . TOBACCO ABUSE 04/25/2008  . POST TRAUMATIC STRESS SYNDROME 04/22/2008  . DEPRESSION 04/22/2008  . INTRACRANIAL HEMORRHAGE 04/22/2008  . Esophageal reflux 04/22/2008  . INTUSSUSCEPTION 04/22/2008  . ABDOMINAL ADHESIONS 04/22/2008  . Hepatic cirrhosis due to chronic hepatitis C infection ([place]) 04/22/2008  . OBSTRUCTION OF BILE DUCT 04/22/2008  . LOW BACK PAIN, CHRONIC sees [facility]. 04/22/2008  . Abdominal pain 04/22/2008  . ABDOMINAL PAIN OTHER SPECIFIED SITE 04/22/2008  . HEPATITIS C, HX OF 04/22/2008    Past Surgical History:  Procedure Laterality Date  . biliary stone removal  2015  . BIOPSY N/A 03/06/2015   Procedure: BIOPSY;  Surgeon: Daneil Dolin, MD;  Location: [facility];  Service: Endoscopy;  Laterality: N/A;  . BIOPSY  11/22/2019   Procedure: BIOPSY;  Surgeon: Daneil Dolin, MD;  [facility];  Service: Endoscopy;;  . BIOPSY  01/09/2020   Procedure: BIOPSY;  Surgeon: Irving Copas., MD;  Location: [place];  Service: Gastroenterology;;  . Wilmon Pali RELEASE Bilateral 12/2010  . CATARACT EXTRACTION W/PHACO Right 01/15/2016   Procedure: CATARACT EXTRACTION PHACO AND INTRAOCULAR LENS PLACEMENT RIGHT EYE CDE=5.88;  Surgeon: Tonny Branch, MD;  Location: [facility];  Service: Ophthalmology;  Laterality: Right;  . CATARACT EXTRACTION W/PHACO Left 02/12/2016   Procedure: CATARACT EXTRACTION PHACO AND INTRAOCULAR LENS PLACEMENT LEFT EYE; CDE:  6.02;  Surgeon: Tonny Branch, MD;  Location: [facility];  Service: Ophthalmology;  Laterality: Left;  . CHOLECYSTECTOMY  2002  . COLONOSCOPY    09/10/2003   diminutive polyp in the rectum cold biopsied/removed/ Normal colon  . COLONOSCOPY WITH PROPOFOL N/A 08/02/2013   IOE:VOJJKKX diverticulosis. next TCS 10 years.  . ESOPHAGOGASTRODUODENOSCOPY    09/10/2003   Small hiatal hernia; otherwise  normal stomach, normal D1 and D2  . ESOPHAGOGASTRODUODENOSCOPY  08/27/2010   FGH:WEXHBZ esophagus.  No varices.  Couple of tiny antral erosions of doubtful clinical significance, otherwise normal stomach, D1 and D2.  . ESOPHAGOGASTRODUODENOSCOPY (EGD) WITH PROPOFOL N/A 08/02/2013   RMR: Portal gastropathy. No explanation for abdominal pain which I believe is more abdominal wall in origin. She has been seen by Dr. Carlis Abbott  over at [facility]. She is up-to-date on cross sectional imaging. She has a pain management physician in [place]. Repeat EGD for varices in 3 years.  . ESOPHAGOGASTRODUODENOSCOPY (EGD) WITH PROPOFOL N/A 03/06/2015   JIR:CVELFYBO gastric mucosac s/p bx  . ESOPHAGOGASTRODUODENOSCOPY (EGD) WITH PROPOFOL N/A 04/18/2017   normal esophagus, portal hypertensive gastropathy, normal duodenum, no specimens collected. Surveillance in Aug 2020  . ESOPHAGOGASTRODUODENOSCOPY (EGD) WITH PROPOFOL N/A 11/22/2019   Procedure: ESOPHAGOGASTRODUODENOSCOPY (EGD) WITH PROPOFOL;  Surgeon: Daneil Dolin, MD;  [facility];  Service: Endoscopy;  Laterality: N/A;  1:45pm-office rescheduled to 4/1 @ 1:00pm  . ESOPHAGOGASTRODUODENOSCOPY (EGD) WITH PROPOFOL N/A 01/09/2020  Procedure: ESOPHAGOGASTRODUODENOSCOPY (EGD) WITH PROPOFOL;  Surgeon: Rush Landmark Telford Nab., MD;  Location: [place];  Service: Gastroenterology;  Laterality: N/A;  . EUS N/A 01/09/2020   Procedure: ESOPHAGEAL ENDOSCOPIC ULTRASOUND (EUS) RADIAL;  Surgeon: Rush Landmark Telford Nab., MD;  Location: [place];  Service: Gastroenterology;  Laterality: N/A;  . LUMBAR FUSION    . NOSE SURGERY  1970  . PERCUTANEOUS TRANSHEPATIC CHOLANGIOGRAHPY AND BILLIARY DRAINAGE  2002  . REPAIR VAGINAL CUFF N/A 10/25/2015   Procedure: REPAIR VAGINAL LACERATION ;  Surgeon: Jonnie Kind, MD;  Location: [facility];  Service: Gynecology;  Laterality: N/A;  . Roux-en-y-hepatojejunostomy  2003   revision in 2013 for intussusception [facility] Dr. Bailey Mech)  . SPINAL FUSION     C5-C7  . TONSILLECTOMY       OB History    Gravida  2   Para  1   Term  1   Preterm      AB  1   Living  1     SAB      IAB      Ectopic  1   Multiple      Live Births              Family History  Problem Relation Age of Onset  . Coronary artery disease Brother   . Cancer Sister        lymphoma  . Cancer Father        brain  . Colon cancer Neg Hx   . Pancreatic cancer Neg Hx   . Stomach cancer Neg Hx   . Esophageal cancer Neg Hx   . Inflammatory bowel disease Neg Hx   . Liver disease Neg Hx   . Rectal cancer Neg Hx     Social History   Tobacco Use  . Smoking status: Current Every Day Smoker    Packs/day: 0.25    Years: 33.00    Pack years: 8.25    Types: Cigarettes  . Smokeless tobacco: Never Used  . Tobacco comment: 1 pack every 3 days  Vaping Use  . Vaping Use: Never used  Substance Use Topics  . Alcohol use: Not Currently    Alcohol/week: 0.0 standard drinks    Comment: As of 01/08/20: Last ETOH 3 days ago (2 glasses of wine)  . Drug use: No    Home Medications Prior to Admission medications   Medication Sig Start Date End Date Taking? Authorizing Provider  acyclovir (ZOVIRAX) 400 MG tablet TAKE 1 TABLET BY MOUTH TWICE DAILY. Patient taking differently: Take 400 mg by mouth 2 (two) times daily. 10/06/20  Yes Florian Buff, MD  albuterol (PROVENTIL) (2.5 MG/3ML) 0.083% nebulizer solution Take 2.5 mg by nebulization 2 (two) times daily as needed for wheezing or shortness of breath.    Yes [provider]  ascorbic acid (VITAMIN C) 250 MG CHEW Chew 250 mg by mouth daily.   Yes [provider]  atenolol (TENORMIN) 25 MG tablet Take 25 mg by mouth every morning.   Yes [provider]  brexpiprazole (REXULTI) 1 MG TABS tablet Take 1 mg by mouth daily.   Yes [provider]  BREZTRI AEROSPHERE 160-9-4.8 MCG/ACT AERO Inhale 2 puffs into the lungs 2 (two) times daily. 11/13/20  Yes  [provider]  Cholecalciferol (VITAMIN D) 50 MCG (2000 UT) CAPS Take 10,000 Units by mouth daily.   Yes [provider]  DULoxetine (CYMBALTA) 60 MG capsule Take 60 mg by mouth daily.  Yes [provider]  etodolac (LODINE) 400 MG tablet Take 400 mg by mouth daily. 07/04/19  Yes [provider]  fluticasone (FLONASE) 50 MCG/ACT nasal spray Place 1 spray into both nostrils 2 (two) times daily.   Yes [provider]  gabapentin (NEURONTIN) 300 MG capsule Take 300 mg by mouth 3 (three) times daily. 12/19/19  Yes [provider]  loratadine (CLARITIN) 10 MG tablet Take 10 mg by mouth daily.   Yes [provider]  meloxicam (MOBIC) 15 MG tablet Take 1 tablet by mouth daily. 10/31/20  Yes [provider]  naproxen (NAPROSYN) 500 MG tablet Take 500 mg by mouth 2 (two) times daily. 11/03/20  Yes [provider]  omeprazole (PRILOSEC) 20 MG capsule TAKE 2 CAPSULES BY MOUTH TWICE DAILY BEFORE A MEAL. Patient taking differently: Take 40 mg by mouth 2 (two) times daily before a meal. 11/03/20  Yes Mansouraty, Telford Nab., MD  oxyCODONE (OXY IR/ROXICODONE) 5 MG immediate release tablet Take 5 mg by mouth 4 (four) times daily.    Yes [provider]  pravastatin (PRAVACHOL) 40 MG tablet Take 40 mg by mouth daily. 07/04/19  Yes [provider]  PROAIR HFA 108 (90 BASE) MCG/ACT inhaler Inhale 2 puffs into the lungs every 6 (six) hours as needed for wheezing or shortness of breath. 02/21/12  Yes [provider]  tiZANidine (ZANAFLEX) 2 MG tablet Take 4 mg by mouth daily. 08/07/19  Yes [provider]  EPIPEN 2-PAK 0.3 MG/0.3ML SOAJ injection Inject 0.3 mg into the skin as needed (allergic reactions).  03/04/13   [provider]    Allergies    Asa [aspirin], Bee venom, Codeine, and Tylenol [acetaminophen]  Review of Systems   Review of Systems  Constitutional: Positive for chills. Negative  for fever.  Respiratory: Positive for shortness of breath.   Cardiovascular: Negative for chest pain.  Gastrointestinal: Positive for abdominal pain and nausea. Negative for constipation and diarrhea.  All other systems reviewed and are negative.   Physical Exam Updated Vital Signs BP 127/75   Pulse 82   Temp 98.4 F (36.9 C) (Oral)   Resp (!) 24   Ht 5\' 5"  (1.651 m)   Wt 78.5 kg   SpO2 94%   BMI 28.79 kg/m   Physical Exam Vitals and nursing note reviewed.  Constitutional:      Appearance: She is obese. She is diaphoretic. She is not ill-appearing.  HENT:     Head: Normocephalic and atraumatic.  Eyes:     Conjunctiva/sclera: Conjunctivae normal.  Cardiovascular:     Rate and Rhythm: Normal rate and regular rhythm.  Pulmonary:     Effort: Pulmonary effort is normal.     Breath sounds: Wheezing present. No rhonchi or rales.  Abdominal:     General: Abdomen is protuberant.     Palpations: Abdomen is soft.     Tenderness: There is abdominal tenderness in the right upper quadrant. There is right CVA tenderness. There is no guarding or rebound.  Musculoskeletal:     Cervical back: Neck supple.  Skin:    General: Skin is warm.  Neurological:     Mental Status: She is alert.     ED Results / Procedures / Treatments   Labs (all labs ordered are listed, but only abnormal results are displayed) Labs Reviewed  COMPREHENSIVE METABOLIC PANEL - Abnormal; Notable for the following components:      Result Value   CO2 19 (*)  Glucose, Bld 128 (*)    All other components within normal limits  CBC WITH DIFFERENTIAL/PLATELET - Abnormal; Notable for the following components:   RDW 16.5 (*)    All other components within normal limits  SARS CORONAVIRUS 2 (TAT 6-24 HRS)  LIPASE, BLOOD  PROTIME-INR    EKG None  Radiology CT Abdomen Pelvis W Contrast  Result Date: 11/27/2020 CLINICAL DATA:  History of cholecystectomy now with RIGHT upper quadrant pain. Equivocal ultrasound  findings. EXAM: CT ABDOMEN AND PELVIS WITH CONTRAST TECHNIQUE: Multidetector CT imaging of the abdomen and pelvis was performed using the standard protocol following bolus administration of intravenous contrast. CONTRAST:  185mL OMNIPAQUE IOHEXOL 300 MG/ML  SOLN COMPARISON:  February 16, 2019 FINDINGS: Lower chest: Tiny pulmonary nodule at the periphery of the RIGHT middle lobe 5 mm (image 2, series 4) not imaged on previous evaluations. No effusion. No consolidation. Hepatobiliary: Hepatic steatosis and signs of cirrhosis with lobular hepatic contours, RIGHT lobe atrophy and LEFT lobe hypertrophy. Portal vein is patent. Diminutive to the small RIGHT lobe. Mild variable perfusion throughout the posterior division of the RIGHT hepatic lobe, slightly increased enhancement as compared to the remainder of the liver. Signs of pneumobilia. No discrete lesion on venous phase imaging in the liver. No gross biliary duct distension but with pneumobilia following biliary reconstruction with hepatic to enteric anastomosis about the porta hepatis. Pancreas: Normal, without mass, inflammation or ductal dilatation. Spleen: Mildly lobular contour the spleen with mild splenic enlargement, top-normal size. Adrenals/Urinary Tract: Adrenal glands are normal. Symmetric renal enhancement. No hydronephrosis. Smooth contour of the collapsed urinary bladder. Stomach/Bowel: Stomach under distended limiting assessment. No acute gastrointestinal process. The appendix is normal. Diverticular disease of the sigmoid colon. Mild segmental colonic thickening in the area of the sigmoid colon on image 64, question of eccentric thickening along the LEFT lateral wall. Vascular/Lymphatic: Calcified and noncalcified atheromatous plaque of the abdominal aorta. No aneurysmal dilation. There is no gastrohepatic or hepatoduodenal ligament lymphadenopathy. No retroperitoneal or mesenteric lymphadenopathy. Smooth contour of the IVC. No pelvic sidewall  lymphadenopathy. Reproductive: Unremarkable Other: Remote postoperative changes and RIGHT subcostal region likely related to prior cholecystectomy. Small periumbilical hernia containing fat. Thinning of the rectus musculature following remote surgery on the RIGHT. No ascites. Musculoskeletal: No acute bone finding. No destructive bone process. Spinal and hip degenerative changes. Signs of L5-S1 spinal fusion with similar appearance. IMPRESSION: 1. Pneumobilia with variable perfusion in the RIGHT hepatic lobe, seen in this patient with cirrhosis post biliary reconstruction with hepatic to enteric anastomosis. Pneumobilia is nonspecific. However, given hepatic to enteric anastomosis would correlate with any signs of cholangitis. 2. Hepatic steatosis and morphologic features of cirrhosis without gross evidence of portal hypertension, top-normal size spleen. 3. Mild segmental colonic thickening in the area of the sigmoid colon, question of eccentric thickening along the LEFT lateral wall. Diverticular disease with sequela of prior inflammation or ongoing low level diverticulitis is considered. Given the presence of potential eccentric thickening would suggest correlation with recent colonoscopy if available or with follow-up colonoscopy. 4. Tiny 5 mm pulmonary nodule at the periphery of the RIGHT middle lobe not imaged on previous evaluations. 5. Aortic atherosclerosis. Aortic Atherosclerosis (ICD10-I70.0). Electronically Signed   By: Zetta Bills M.D.   On: 11/27/2020 13:30   DG Chest Port 1 View  Result Date: 11/27/2020 CLINICAL DATA:  Shortness of breath EXAM: PORTABLE CHEST 1 VIEW COMPARISON:  February 25, 2018 FINDINGS: Lungs are clear. Heart size and pulmonary vascularity are normal. No adenopathy.  Postoperative change noted in the lower cervical spine. IMPRESSION: Lungs clear.  Cardiac silhouette normal. Electronically Signed   By: Lowella Grip III M.D.   On: 11/27/2020 11:32   US Abdomen Limited RUQ  (LIVER/GB)  Result Date: 11/27/2020 CLINICAL DATA:  Upper abdominal pain EXAM: ULTRASOUND ABDOMEN LIMITED RIGHT UPPER QUADRANT COMPARISON:  Ultrasound right upper quadrant October 29, 2020 FINDINGS: Gallbladder: Surgically absent. Common bile duct: Diameter: 8 mm which may be within normal limits for post cholecystectomy state. No intrahepatic or extrahepatic biliary duct dilatation evident. Liver: No focal lesion identified. Liver echogenicity increased diffusely. Equivocal nodularity to the liver contour. Portal vein is patent on color Doppler imaging with normal direction of blood flow towards the liver. Other: None. IMPRESSION: Diffuse increased liver echogenicity with equivocal liver contour nodularity. Appearance is consistent with a degree of hepatic cirrhosis and potential underlying hepatic steatosis. No focal liver lesions evident. Note that the sensitivity of ultrasound for detection of focal liver lesions is diminished significantly in this circumstance. Gallbladder absent. Electronically Signed   By: Lowella Grip III M.D.   On: 11/27/2020 11:34    Procedures Procedures   Medications Ordered in ED Medications  HYDROmorphone (DILAUDID) injection 1 mg (1 mg Intravenous Given 11/27/20 1130)  albuterol (VENTOLIN HFA) 108 (90 Base) MCG/ACT inhaler 2 puff (2 puffs Inhalation Given 11/27/20 1129)  iohexol (OMNIPAQUE) 300 MG/ML solution 100 mL (100 mLs Intravenous Contrast Given 11/27/20 1242)    ED Course  I have reviewed the triage vital signs and the nursing notes.  Pertinent labs & imaging results that were available during my care of the patient were reviewed by me and considered in my medical decision making (see chart for details).    MDM Rules/Calculators/A&P                          70 year old female presents to the ED today via EMS with complaint of acute on chronic right upper quadrant abdominal pain that started 3 to 4 days ago with associated dry heaving as well as chills and  diaphoresis with shortness of breath that she feels like her liver is pressing up against her lungs.  On arrival to the ED patient is afebrile, nontachycardic.  She is mildly tachypneic.  She is able speak in full sentences however does have diffuse wheezing throughout.  She is a current every day smoker.  She also has right upper quadrant abdominal tenderness palpation on exam with right flank tenderness to palpation.  She denies any urinary symptoms.  It does appear she recently had an ultrasound of her liver on 3/09 without any acute abnormalities.  She has a known history of NASH/ASH as well as HCV Cirrhosis followed by Dr. Gala Romney [facility].  Plan for lab work at this time as well as EKG, chest x-ray, right upper quadrant ultrasound.  CBC without leukocytosis. Hgb stable at 13.3 CMP with glucose 128; bicarb 19. No gap. No other electrolyte abnormalities Lipase 42  CXR clear RUQ ultrasound: IMPRESSION:  Diffuse increased liver echogenicity with equivocal liver contour  nodularity. Appearance is consistent with a degree of hepatic  cirrhosis and potential underlying hepatic steatosis. No focal liver  lesions evident. Note that the sensitivity of ultrasound for  detection of focal liver lesions is diminished significantly in this  circumstance.    Gallbladder absent.   Given unequivocal ultrasound CT scan obtained: IMPRESSION:  1. Pneumobilia with variable perfusion in the RIGHT hepatic lobe,  seen in  this patient with cirrhosis post biliary reconstruction with  hepatic to enteric anastomosis. Pneumobilia is nonspecific. However,  given hepatic to enteric anastomosis would correlate with any signs  of cholangitis.  2. Hepatic steatosis and morphologic features of cirrhosis without  gross evidence of portal hypertension, top-normal size spleen.  3. Mild segmental colonic thickening in the area of the sigmoid  colon, question of eccentric thickening along the LEFT lateral wall.  Diverticular  disease with sequela of prior inflammation or ongoing  low level diverticulitis is considered. Given the presence of  potential eccentric thickening would suggest correlation with recent  colonoscopy if available or with follow-up colonoscopy.  4. Tiny 5 mm pulmonary nodule at the periphery of the RIGHT middle  lobe not imaged on previous evaluations.  5. Aortic atherosclerosis.    Aortic Atherosclerosis (ICD10-I70.0).   On reevaluation pt has some mild improvement in symptoms. Her tachypnea has resolved at this time. Given abnormal CT scan have discussed case with pt's gastroenterologist Dr. Gala Romney who will come consult on patient however recommends medicine admission at this time.   Discussed case with Dr. Manuella Ghazi [facility] who agrees to accept patient for admission.   This note was prepared using Dragon voice recognition software and may include unintentional dictation errors due to the inherent limitations of voice recognition software.  Final Clinical Impression(s) / ED Diagnoses Final diagnoses:  RUQ abdominal pain  Pneumobilia    Rx / DC Orders ED Discharge Orders    None       Eustaquio Maize, PA-C 11/27/20 1434    Milton Ferguson, MD 11/28/20 1331

## 2020-11-27 NOTE — Plan of Care (Signed)

## 2020-11-27 NOTE — H&P (Addendum)
History and Physical    Sabrina Wyatt VQQ:[id number] DOB: 22-Jun-1951 DOA: 11/27/2020  PCP: Lucia Gaskins, MD   Patient coming from: Home  Chief Complaint: Abdominal pain  HPI: Sabrina Wyatt is a 70 y.o. female with medical history significant for hypertension, dyslipidemia, chronic abdominal pain with prior cholecystectomy, and cirrhosis with prior hep C who presented to the ED via EMS with complaints of acute on chronic right upper quadrant abdominal pain that began approximately 2 days ago.  She was able to eat and drink normally, but did have some mild dry heaving.  She states that her pain is intermittent in nature and is worse with movement.  She states that she has never had pain quite like this before and takes oxycodone at home with minimal relief.  She denies any chest pain, but when she gets her acute pain she does experience shortness of breath.  She is having normal bowel movements with last 1 this morning and denies any diarrhea, fevers, chills, or other concerns.   ED Course: Vital signs stable and patient is afebrile.  Laboratory data within normal limits.  Ultrasound of the right upper quadrant was initially unremarkable and CT scan was pursued demonstrating pneumobilia.  No gallbladder is present.  Chest x-ray with no acute findings.  EKG was sinus rhythm.  She has been given some pain medication in the ED and is currently comfortable.  EDP has discussed with GI who will see in consultation.  Review of Systems: Reviewed as noted above, otherwise negative.  Past Medical History:  Diagnosis Date  . Abdominal wall pain    chronic  . Anemia   . Arthritis    rheumatoid  . Biliary stone   . Chronic abdominal pain   . Chronic flank pain   . Chronic low back pain   . Cirrhosis, hepatitis C    U/S on 03/09/16= no HCC, pt has been vaccinated for Hep A and Hep B. s/p treatment of HCV with eradication  . Depression   . Gall stones, common bile duct 2002  . GERD  (gastroesophageal reflux disease)   . Gonorrhea   . Hepatitis     successfully treated for Hep C; eradicated  . Hypertension   . 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 Intussusception ([place]) 04/2012  . Polyp of duodenum    Duodenal polyp  . Polysubstance abuse ([place])    HX of  . PTSD (post-traumatic stress disorder)   . Pulmonary nodules   . S/P colonoscopy 2005   Dr Moss Mc polyp removed, otherwise normal  . S/P endoscopy 08/27/10   antral erosions, otherwise normal, due egd 08/2012 to screen for varices  . Seizures ([place]) 1998   with brain bleed x1. None since then and on no meds  . Shortness of breath   . Tobacco abuse     Past Surgical History:  Procedure Laterality Date  . biliary stone removal  2015  . BIOPSY N/A 03/06/2015   Procedure: BIOPSY;  Surgeon: Daneil Dolin, MD;  Location: [facility];  Service: Endoscopy;  Laterality: N/A;  . BIOPSY  11/22/2019   Procedure: BIOPSY;  Surgeon: Daneil Dolin, MD;  [facility];  Service: Endoscopy;;  . BIOPSY  01/09/2020   Procedure: BIOPSY;  Surgeon: Irving Copas., MD;  Location: [facility];  Service: Gastroenterology;;  . Wilmon Pali RELEASE Bilateral 12/2010  . CATARACT EXTRACTION W/PHACO Right 01/15/2016   Procedure: CATARACT EXTRACTION PHACO AND INTRAOCULAR LENS  PLACEMENT RIGHT EYE CDE=5.88;  Surgeon: Tonny Branch, MD;  Location: [facility];  Service: Ophthalmology;  Laterality: Right;  . CATARACT EXTRACTION W/PHACO Left 02/12/2016   Procedure: CATARACT EXTRACTION PHACO AND INTRAOCULAR LENS PLACEMENT LEFT EYE; CDE:  6.02;  Surgeon: Tonny Branch, MD;  Location: [facility];  Service: Ophthalmology;  Laterality: Left;  . CHOLECYSTECTOMY  2002  . COLONOSCOPY    09/10/2003   diminutive polyp in the rectum cold biopsied/removed/ Normal colon  . COLONOSCOPY WITH PROPOFOL N/A 08/02/2013   CHE:NIDPOEU diverticulosis. next TCS 10 years.  . ESOPHAGOGASTRODUODENOSCOPY    09/10/2003   Small hiatal hernia; otherwise normal  stomach, normal D1 and D2  . ESOPHAGOGASTRODUODENOSCOPY  08/27/2010   MPN:TIRWER esophagus.  No varices.  Couple of tiny antral erosions of doubtful clinical significance, otherwise normal stomach, D1 and D2.  . ESOPHAGOGASTRODUODENOSCOPY (EGD) WITH PROPOFOL N/A 08/02/2013   RMR: Portal gastropathy. No explanation for abdominal pain which I believe is more abdominal wall in origin. She has been seen by Dr. Carlis Abbott  over at [facility]. She is up-to-date on cross sectional imaging. She has a pain management physician in [place]. Repeat EGD for varices in 3 years.  . ESOPHAGOGASTRODUODENOSCOPY (EGD) WITH PROPOFOL N/A 03/06/2015   XVQ:MGQQPYPP gastric mucosac s/p bx  . ESOPHAGOGASTRODUODENOSCOPY (EGD) WITH PROPOFOL N/A 04/18/2017   normal esophagus, portal hypertensive gastropathy, normal duodenum, no specimens collected. Surveillance in Aug 2020  . ESOPHAGOGASTRODUODENOSCOPY (EGD) WITH PROPOFOL N/A 11/22/2019   Procedure: ESOPHAGOGASTRODUODENOSCOPY (EGD) WITH PROPOFOL;  Surgeon: Daneil Dolin, MD;  [facility];  Service: Endoscopy;  Laterality: N/A;  1:45pm-office rescheduled to 4/1 @ 1:00pm  . ESOPHAGOGASTRODUODENOSCOPY (EGD) WITH PROPOFOL N/A 01/09/2020   Procedure: ESOPHAGOGASTRODUODENOSCOPY (EGD) WITH PROPOFOL;  Surgeon: Rush Landmark Telford Nab., MD;  Location: [place];  Service: Gastroenterology;  Laterality: N/A;  . EUS N/A 01/09/2020   Procedure: ESOPHAGEAL ENDOSCOPIC ULTRASOUND (EUS) RADIAL;  Surgeon: Rush Landmark Telford Nab., MD;  Location: [place];  Service: Gastroenterology;  Laterality: N/A;  . LUMBAR FUSION    . NOSE SURGERY  1970  . PERCUTANEOUS TRANSHEPATIC CHOLANGIOGRAHPY AND BILLIARY DRAINAGE  2002  . REPAIR VAGINAL CUFF N/A 10/25/2015   Procedure: REPAIR VAGINAL LACERATION ;  Surgeon: Jonnie Kind, MD;  Location: [facility];  Service: Gynecology;  Laterality: N/A;  . Roux-en-y-hepatojejunostomy  2003   revision in 2013 for intussusception [facility] Dr. Bailey Mech)  .  SPINAL FUSION     C5-C7  . TONSILLECTOMY       reports that she has been smoking cigarettes. She has a 8.25 pack-year smoking history. She has never used smokeless tobacco. She reports previous alcohol use. She reports that she does not use drugs.  Allergies  Allergen Reactions  . Asa [Aspirin] Nausea Only  . Bee Venom Hives  . Codeine Nausea Only  . Tylenol [Acetaminophen] Nausea And Vomiting and Swelling    Swelling of hands and feet    Family History  Problem Relation Age of Onset  . Coronary artery disease Brother   . Cancer Sister        lymphoma  . Cancer Father        brain  . Colon cancer Neg Hx   . Pancreatic cancer Neg Hx   . Stomach cancer Neg Hx   . Esophageal cancer Neg Hx   . Inflammatory bowel disease Neg Hx   . Liver disease Neg Hx   . Rectal cancer Neg Hx     Prior to Admission medications   Medication  Sig Start Date End Date Taking? Authorizing Provider  acyclovir (ZOVIRAX) 400 MG tablet TAKE 1 TABLET BY MOUTH TWICE DAILY. Patient taking differently: Take 400 mg by mouth 2 (two) times daily. 10/06/20  Yes Florian Buff, MD  albuterol (PROVENTIL) (2.5 MG/3ML) 0.083% nebulizer solution Take 2.5 mg by nebulization 2 (two) times daily as needed for wheezing or shortness of breath.    Yes [provider]  ascorbic acid (VITAMIN C) 250 MG CHEW Chew 250 mg by mouth daily.   Yes [provider]  atenolol (TENORMIN) 25 MG tablet Take 25 mg by mouth every morning.   Yes [provider]  brexpiprazole (REXULTI) 1 MG TABS tablet Take 1 mg by mouth daily.   Yes [provider]  BREZTRI AEROSPHERE 160-9-4.8 MCG/ACT AERO Inhale 2 puffs into the lungs 2 (two) times daily. 11/13/20  Yes [provider]  Cholecalciferol (VITAMIN D) 50 MCG (2000 UT) CAPS Take 10,000 Units by mouth daily.   Yes [provider]  DULoxetine (CYMBALTA) 60 MG capsule Take 60 mg by mouth daily.   Yes [provider]  etodolac (LODINE)  400 MG tablet Take 400 mg by mouth daily. 07/04/19  Yes [provider]  fluticasone (FLONASE) 50 MCG/ACT nasal spray Place 1 spray into both nostrils 2 (two) times daily.   Yes [provider]  gabapentin (NEURONTIN) 300 MG capsule Take 300 mg by mouth 3 (three) times daily. 12/19/19  Yes [provider]  loratadine (CLARITIN) 10 MG tablet Take 10 mg by mouth daily.   Yes [provider]  meloxicam (MOBIC) 15 MG tablet Take 1 tablet by mouth daily. 10/31/20  Yes [provider]  naproxen (NAPROSYN) 500 MG tablet Take 500 mg by mouth 2 (two) times daily. 11/03/20  Yes [provider]  omeprazole (PRILOSEC) 20 MG capsule TAKE 2 CAPSULES BY MOUTH TWICE DAILY BEFORE A MEAL. Patient taking differently: Take 40 mg by mouth 2 (two) times daily before a meal. 11/03/20  Yes Mansouraty, Telford Nab., MD  oxyCODONE (OXY IR/ROXICODONE) 5 MG immediate release tablet Take 5 mg by mouth 4 (four) times daily.    Yes [provider]  pravastatin (PRAVACHOL) 40 MG tablet Take 40 mg by mouth daily. 07/04/19  Yes [provider]  PROAIR HFA 108 (90 BASE) MCG/ACT inhaler Inhale 2 puffs into the lungs every 6 (six) hours as needed for wheezing or shortness of breath. 02/21/12  Yes [provider]  tiZANidine (ZANAFLEX) 2 MG tablet Take 4 mg by mouth daily. 08/07/19  Yes [provider]  EPIPEN 2-PAK 0.3 MG/0.3ML SOAJ injection Inject 0.3 mg into the skin as needed (allergic reactions).  03/04/13   [provider]    Physical Exam: Vitals:   11/27/20 1200 11/27/20 1230 11/27/20 1300 11/27/20 1330  BP: 116/84 139/82 136/77 123/73  Pulse: 73 73 81 72  Resp: 18 (!) 21 18 17   Temp:      TempSrc:      SpO2: 91% 91% 92% 93%  Weight:      Height:        Constitutional: NAD, calm, comfortable Vitals:   11/27/20 1200 11/27/20 1230 11/27/20 1300 11/27/20 1330  BP: 116/84 139/82 136/77 123/73  Pulse: 73 73 81 72  Resp: 18 (!)  21 18 17   Temp:      TempSrc:      SpO2: 91% 91% 92% 93%  Weight:      Height:  Eyes: lids and conjunctivae normal Neck: normal, supple Respiratory: clear to auscultation bilaterally. Normal respiratory effort. No accessory muscle use.  Cardiovascular: Regular rate and rhythm, no murmurs. Abdomen: Mild tenderness to gentle palpation over the right upper quadrant, no distention. Bowel sounds positive.  Musculoskeletal:  No edema. Skin: no rashes, lesions, ulcers.  Psychiatric: Flat affect  Labs on Admission: I have personally reviewed following labs and imaging studies  CBC: Recent Labs  Lab 11/27/20 1133  WBC 8.0  NEUTROABS 4.2  HGB 13.3  HCT 42.7  MCV 90.5  PLT 563   Basic Metabolic Panel: Recent Labs  Lab 11/27/20 1133  NA 140  K 3.7  CL 107  CO2 19*  GLUCOSE 128*  BUN 8  CREATININE 0.81  CALCIUM 9.1   GFR: Estimated Creatinine Clearance: 67.9 mL/min (by C-G formula based on SCr of 0.81 mg/dL). Liver Function Tests: Recent Labs  Lab 11/27/20 1133  AST 25  ALT 24  ALKPHOS 94  BILITOT 0.5  PROT 6.9  ALBUMIN 3.5   Recent Labs  Lab 11/27/20 1133  LIPASE 42   No results for input(s): AMMONIA in the last 168 hours. Coagulation Profile: Recent Labs  Lab 11/27/20 1133  INR 1.1   Cardiac Enzymes: No results for input(s): CKTOTAL, CKMB, CKMBINDEX, TROPONINI in the last 168 hours. BNP (last 3 results) No results for input(s): PROBNP in the last 8760 hours. HbA1C: No results for input(s): HGBA1C in the last 72 hours. CBG: No results for input(s): GLUCAP in the last 168 hours. Lipid Profile: No results for input(s): CHOL, HDL, LDLCALC, TRIG, CHOLHDL, LDLDIRECT in the last 72 hours. Thyroid Function Tests: No results for input(s): TSH, T4TOTAL, FREET4, T3FREE, THYROIDAB in the last 72 hours. Anemia Panel: No results for input(s): VITAMINB12, FOLATE, FERRITIN, TIBC, IRON, RETICCTPCT in the last 72 hours. Urine analysis:    Component Value  Date/Time   COLORURINE YELLOW 07/15/2018 1045   APPEARANCEUR HAZY (A) 07/15/2018 1045   APPEARANCEUR Cloudy (A) 11/19/2014 1543   LABSPEC 1.008 07/15/2018 1045   PHURINE 5.0 07/15/2018 1045   GLUCOSEU NEGATIVE 07/15/2018 1045   HGBUR NEGATIVE 07/15/2018 [place] 07/15/2018 1045   BILIRUBINUR Negative 11/19/2014 1543   KETONESUR NEGATIVE 07/15/2018 1045   PROTEINUR NEGATIVE 07/15/2018 1045   UROBILINOGEN 1.0 11/01/2013 1035   NITRITE NEGATIVE 07/15/2018 1045   LEUKOCYTESUR NEGATIVE 07/15/2018 1045   LEUKOCYTESUR Negative 11/19/2014 1543    Radiological Exams on Admission: CT Abdomen Pelvis W Contrast  Result Date: 11/27/2020 CLINICAL DATA:  History of cholecystectomy now with RIGHT upper quadrant pain. Equivocal ultrasound findings. EXAM: CT ABDOMEN AND PELVIS WITH CONTRAST TECHNIQUE: Multidetector CT imaging of the abdomen and pelvis was performed using the standard protocol following bolus administration of intravenous contrast. CONTRAST:  131mL OMNIPAQUE IOHEXOL 300 MG/ML  SOLN COMPARISON:  February 16, 2019 FINDINGS: Lower chest: Tiny pulmonary nodule at the periphery of the RIGHT middle lobe 5 mm (image 2, series 4) not imaged on previous evaluations. No effusion. No consolidation. Hepatobiliary: Hepatic steatosis and signs of cirrhosis with lobular hepatic contours, RIGHT lobe atrophy and LEFT lobe hypertrophy. Portal vein is patent. Diminutive to the small RIGHT lobe. Mild variable perfusion throughout the posterior division of the RIGHT hepatic lobe, slightly increased enhancement as compared to the remainder of the liver. Signs of pneumobilia. No discrete lesion on venous phase imaging in the liver. No gross biliary duct distension but with pneumobilia following biliary reconstruction with hepatic to enteric anastomosis about the porta  hepatis. Pancreas: Normal, without mass, inflammation or ductal dilatation. Spleen: Mildly lobular contour the spleen with mild splenic  enlargement, top-normal size. Adrenals/Urinary Tract: Adrenal glands are normal. Symmetric renal enhancement. No hydronephrosis. Smooth contour of the collapsed urinary bladder. Stomach/Bowel: Stomach under distended limiting assessment. No acute gastrointestinal process. The appendix is normal. Diverticular disease of the sigmoid colon. Mild segmental colonic thickening in the area of the sigmoid colon on image 64, question of eccentric thickening along the LEFT lateral wall. Vascular/Lymphatic: Calcified and noncalcified atheromatous plaque of the abdominal aorta. No aneurysmal dilation. There is no gastrohepatic or hepatoduodenal ligament lymphadenopathy. No retroperitoneal or mesenteric lymphadenopathy. Smooth contour of the IVC. No pelvic sidewall lymphadenopathy. Reproductive: Unremarkable Other: Remote postoperative changes and RIGHT subcostal region likely related to prior cholecystectomy. Small periumbilical hernia containing fat. Thinning of the rectus musculature following remote surgery on the RIGHT. No ascites. Musculoskeletal: No acute bone finding. No destructive bone process. Spinal and hip degenerative changes. Signs of L5-S1 spinal fusion with similar appearance. IMPRESSION: 1. Pneumobilia with variable perfusion in the RIGHT hepatic lobe, seen in this patient with cirrhosis post biliary reconstruction with hepatic to enteric anastomosis. Pneumobilia is nonspecific. However, given hepatic to enteric anastomosis would correlate with any signs of cholangitis. 2. Hepatic steatosis and morphologic features of cirrhosis without gross evidence of portal hypertension, top-normal size spleen. 3. Mild segmental colonic thickening in the area of the sigmoid colon, question of eccentric thickening along the LEFT lateral wall. Diverticular disease with sequela of prior inflammation or ongoing low level diverticulitis is considered. Given the presence of potential eccentric thickening would suggest correlation  with recent colonoscopy if available or with follow-up colonoscopy. 4. Tiny 5 mm pulmonary nodule at the periphery of the RIGHT middle lobe not imaged on previous evaluations. 5. Aortic atherosclerosis. Aortic Atherosclerosis (ICD10-I70.0). Electronically Signed   By: Zetta Bills M.D.   On: 11/27/2020 13:30   DG Chest Port 1 View  Result Date: 11/27/2020 CLINICAL DATA:  Shortness of breath EXAM: PORTABLE CHEST 1 VIEW COMPARISON:  February 25, 2018 FINDINGS: Lungs are clear. Heart size and pulmonary vascularity are normal. No adenopathy. Postoperative change noted in the lower cervical spine. IMPRESSION: Lungs clear.  Cardiac silhouette normal. Electronically Signed   By: Lowella Grip III M.D.   On: 11/27/2020 11:32   US Abdomen Limited RUQ (LIVER/GB)  Result Date: 11/27/2020 CLINICAL DATA:  Upper abdominal pain EXAM: ULTRASOUND ABDOMEN LIMITED RIGHT UPPER QUADRANT COMPARISON:  Ultrasound right upper quadrant October 29, 2020 FINDINGS: Gallbladder: Surgically absent. Common bile duct: Diameter: 8 mm which may be within normal limits for post cholecystectomy state. No intrahepatic or extrahepatic biliary duct dilatation evident. Liver: No focal lesion identified. Liver echogenicity increased diffusely. Equivocal nodularity to the liver contour. Portal vein is patent on color Doppler imaging with normal direction of blood flow towards the liver. Other: None. IMPRESSION: Diffuse increased liver echogenicity with equivocal liver contour nodularity. Appearance is consistent with a degree of hepatic cirrhosis and potential underlying hepatic steatosis. No focal liver lesions evident. Note that the sensitivity of ultrasound for detection of focal liver lesions is diminished significantly in this circumstance. Gallbladder absent. Electronically Signed   By: Lowella Grip III M.D.   On: 11/27/2020 11:34    EKG: Independently reviewed. 83bpm, SR  Assessment/Plan Active Problems:   Pneumobilia    Acute on  chronic right upper quadrant abdominal pain with pneumobilia -Keep n.p.o. -IV fluid -Pain management with IV Dilaudid as needed -Appreciate GI evaluation  History of  liver cirrhosis with prior hep C and alcohol use -Denies any significant alcohol abuse at this time  History of hypertension -Continue atenolol  Chronic pain/depression -Continue home medications  Dyslipidemia -Plan to hold statin for now   DVT prophylaxis: Lovenox Code Status: Full Family Communication: Pt will call Disposition Plan:Admit for evaluation of pneumobilia Consults called:GI Admission status: Inpatient, Tele  Cherysh Epperly D Trashawn Oquendo DO [facility]  If 7PM-7AM, please contact night-coverage [email or web address]  11/27/2020, 2:45 PM

## 2020-11-28 ENCOUNTER — Inpatient Hospital Stay (HOSPITAL_COMMUNITY): Payer: 59 | Admitting: Anesthesiology

## 2020-11-28 ENCOUNTER — Encounter (HOSPITAL_COMMUNITY)
Admission: EM | Disposition: A | Discharge status: Home / Self Care | Payer: Self-pay | Source: Home / Self Care | Attending: Internal Medicine

## 2020-11-28 ENCOUNTER — Encounter (HOSPITAL_COMMUNITY): Payer: Self-pay | Admitting: Internal Medicine

## 2020-11-28 DIAGNOSIS — K317 Polyp of stomach and duodenum: Secondary | ICD-10-CM

## 2020-11-28 DIAGNOSIS — K2970 Gastritis, unspecified, without bleeding: Secondary | ICD-10-CM

## 2020-11-28 HISTORY — PX: ESOPHAGOGASTRODUODENOSCOPY (EGD) WITH PROPOFOL: SHX5813

## 2020-11-28 HISTORY — PX: POLYPECTOMY: SHX5525

## 2020-11-28 LAB — CBC
HCT: 41.9 % (ref 36.0–46.0)
Hemoglobin: 12.7 g/dL (ref 12.0–15.0)
MCH: 28.1 pg (ref 26.0–34.0)
MCHC: 30.3 g/dL (ref 30.0–36.0)
MCV: 92.7 fL (ref 80.0–100.0)
Platelets: 184 10*3/uL (ref 150–400)
RBC: 4.52 MIL/uL (ref 3.87–5.11)
RDW: 16.7 % — ABNORMAL HIGH (ref 11.5–15.5)
WBC: 6.2 10*3/uL (ref 4.0–10.5)
nRBC: 0 % (ref 0.0–0.2)

## 2020-11-28 LAB — COMPREHENSIVE METABOLIC PANEL
ALT: 20 U/L (ref 0–44)
AST: 24 U/L (ref 15–41)
Albumin: 3.2 g/dL — ABNORMAL LOW (ref 3.5–5.0)
Alkaline Phosphatase: 85 U/L (ref 38–126)
Anion gap: 10 (ref 5–15)
BUN: 8 mg/dL (ref 8–23)
CO2: 27 mmol/L (ref 22–32)
Calcium: 8.6 mg/dL — ABNORMAL LOW (ref 8.9–10.3)
Chloride: 106 mmol/L (ref 98–111)
Creatinine, Ser: 0.85 mg/dL (ref 0.44–1.00)
GFR, Estimated: >60 mL/min (ref >60)
Glucose, Bld: 100 mg/dL — ABNORMAL HIGH (ref 70–99)
Potassium: 4.4 mmol/L (ref 3.5–5.1)
Sodium: 143 mmol/L (ref 135–145)
Total Bilirubin: 0.6 mg/dL (ref 0.3–1.2)
Total Protein: 6.2 g/dL — ABNORMAL LOW (ref 6.5–8.1)

## 2020-11-28 LAB — HIV ANTIBODY (ROUTINE TESTING W REFLEX): HIV Screen 4th Generation wRfx: REACTIVE — AB

## 2020-11-28 LAB — MAGNESIUM: Magnesium: 2.1 mg/dL (ref 1.7–2.4)

## 2020-11-28 SURGERY — ESOPHAGOGASTRODUODENOSCOPY (EGD) WITH PROPOFOL
Anesthesia: General

## 2020-11-28 MED ORDER — SODIUM CHLORIDE 0.9 % IV SOLN: INTRAVENOUS | Status: DC

## 2020-11-28 MED ORDER — ALBUTEROL SULFATE (2.5 MG/3ML) 0.083% IN NEBU
2.5000 mg | INHALATION_SOLUTION | Freq: Two times a day (BID) | RESPIRATORY_TRACT | Status: DC
  Administered 2020-11-29: 2.5 mg via RESPIRATORY_TRACT
  Filled 2020-11-28: qty 3

## 2020-11-28 MED ORDER — ENOXAPARIN SODIUM 40 MG/0.4ML ~~LOC~~ SOLN
40.0000 mg | SUBCUTANEOUS | Status: DC
  Administered 2020-11-28: 40 mg via SUBCUTANEOUS
  Filled 2020-11-28: qty 0.4

## 2020-11-28 MED ORDER — LACTATED RINGERS IV SOLN: INTRAVENOUS | Status: DC | PRN

## 2020-11-28 MED ORDER — PROPOFOL 500 MG/50ML IV EMUL
INTRAVENOUS | Status: DC | PRN
  Administered 2020-11-28: 150 ug/kg/min via INTRAVENOUS

## 2020-11-28 MED ORDER — LACTATED RINGERS IV SOLN
INTRAVENOUS | Status: DC
  Administered 2020-11-28: 1000 mL via INTRAVENOUS

## 2020-11-28 NOTE — Plan of Care (Signed)

## 2020-11-28 NOTE — Transfer of Care (Signed)
Immediate Anesthesia Transfer of Care Note  Patient: Sabrina Wyatt  Procedure(s) Performed: ESOPHAGOGASTRODUODENOSCOPY (EGD) WITH PROPOFOL (N/A ) POLYPECTOMY  Patient Location: PACU  Anesthesia Type:General  Level of Consciousness: awake, alert , oriented and patient cooperative  Airway & Oxygen Therapy: Patient Spontanous Breathing  Post-op Assessment: Report given to RN, Post -op Vital signs reviewed and stable and Patient moving all extremities X 4  Post vital signs: Reviewed and stable  Last Vitals:  Vitals Value Taken Time  BP    Temp    Pulse    Resp    SpO2      Last Pain:  Vitals:   11/28/20 1623  TempSrc:   PainSc: 7       Patients Stated Pain Goal: 8 (16/10/96 0454)  Complications: No complications documented.

## 2020-11-28 NOTE — Op Note (Signed)
[facility] Patient Name: Sabrina Wyatt Procedure Date: 11/28/2020 10:52 AM MRN: [id number] Date of Birth: 09-Sep-1950 Attending MD: Elon Alas. Abbey Chatters DO CSN: [id number] Age: 70 Admit Type: Inpatient Procedure:                Upper GI endoscopy Indications:              Epigastric abdominal pain Providers:                Elon Alas. Abbey Chatters, DO, Gwynneth Albright RN, RN,                            Nelma Rothman, Technician Referring MD:              Medicines:                See the Anesthesia note for documentation of the                            administered medications Complications:            No immediate complications. Estimated Blood Loss:     Estimated blood loss was minimal. Procedure:                Pre-Anesthesia Assessment:                           - The anesthesia plan was to use monitored                            anesthesia care (MAC).                           After obtaining informed consent, the endoscope was                            passed under direct vision. Throughout the                            procedure, the patient's blood pressure, pulse, and                            oxygen saturations were monitored continuously. The                            [id number]-[id number] ([id number]) was introduced through the                            mouth, and advanced to the second part of duodenum.                            The upper GI endoscopy was accomplished without                            difficulty. The patient tolerated the procedure                            well. Scope In: 4:29:55  PM Scope Out: 4:33:52 PM Total Procedure Duration: 0 hours 3 minutes 57 seconds  Findings:      There is no endoscopic evidence of esophagitis, inflammation,       ulcerations or varices in the entire esophagus.      Diffuse moderate inflammation characterized by erosions and erythema was       found in the entire examined stomach. Biopsies were taken with a cold       forceps for  Helicobacter pylori testing.      A single 10 mm semi-sessile polyp with no bleeding was found in the       first portion of the duodenum. Biopsies were taken with a cold forceps       for histology. Impression:               - Gastritis. Biopsied.                           - A single duodenal polyp. Biopsied. Moderate Sedation:      Per Anesthesia Care Recommendation:           - Return patient to hospital ward for ongoing care.                           - Advance diet as tolerated.                           - Use a proton pump inhibitor PO BID.                           - No ibuprofen, naproxen, or other non-steroidal                            anti-inflammatory drugs. Procedure Code(s):        --- Professional ---                           [id number], Esophagogastroduodenoscopy, flexible,                            transoral; with biopsy, single or multiple Diagnosis Code(s):        --- Professional ---                           K29.70, Gastritis, unspecified, without bleeding                           K31.7, Polyp of stomach and duodenum                           R10.13, Epigastric pain CPT copyright 2019 American Medical Association. All rights reserved. The codes documented in this report are preliminary and upon coder review may  be revised to meet current compliance requirements. Elon Alas. Abbey Chatters, DO [name], DO 11/28/2020 4:37:11 PM This report has been signed electronically. Number of Addenda: 0

## 2020-11-28 NOTE — Care Management Important Message (Signed)
Important Message  Patient Details  Name: Sabrina Wyatt MRN: [id number] Date of Birth: 24-Oct-1950   Medicare Important Message Given:  Yes     Tommy Medal 11/28/2020, 3:36 PM

## 2020-11-28 NOTE — Anesthesia Postprocedure Evaluation (Signed)
Anesthesia Post Note  Patient: Sabrina Wyatt  Procedure(s) Performed: ESOPHAGOGASTRODUODENOSCOPY (EGD) WITH PROPOFOL (N/A ) POLYPECTOMY  Patient location during evaluation: PACU Anesthesia Type: General Level of consciousness: awake, oriented, awake and alert and patient cooperative Pain management: satisfactory to patient Vital Signs Assessment: post-procedure vital signs reviewed and stable Respiratory status: spontaneous breathing, respiratory function stable and nonlabored ventilation Cardiovascular status: stable Postop Assessment: no apparent nausea or vomiting Anesthetic complications: no   No complications documented.   Last Vitals:  Vitals:   11/28/20 1300 11/28/20 1522  BP: 135/72 139/65  Pulse: 72   Resp: 18   Temp: 37.1 C   SpO2: 98% 99%    Last Pain:  Vitals:   11/28/20 1623  TempSrc:   PainSc: 7                  Holley Kocurek

## 2020-11-28 NOTE — Progress Notes (Signed)
Subjective: Continues with dull RUQ pain, no N/V. States pain started 2 weeks ago and worsened, prompting ED presentation. No worsening postprandially. Just "always there". Initially states she was feeling better this morning but when asked about pain, states unchanged and no improvement from admission.   Objective: Vital signs in last 24 hours: Temp:  [98.4 F (36.9 C)-99.1 F (37.3 C)] 99.1 F (37.3 C) (04/08 0600) Pulse Rate:  [62-84] 74 (04/08 0600) Resp:  [14-29] 20 (04/08 0600) BP: (109-139)/(68-84) 136/74 (04/08 0600) SpO2:  [91 %-100 %] 98 % (04/08 0814) Weight:  [78.5 kg-81.2 kg] 81.2 kg (04/07 1653) Last BM Date: 11/27/20 General:   Alert and oriented, sleepy but responds appropriately  Head:  Normocephalic and atraumatic. Abdomen:  Bowel sounds present, soft, reported TTP RUQ, no other tenderness elsewhere, no rebound or guarding, multiple scars from prior abdominal surgeries Neurologic:   oriented x4 Psych:  Flat affect   Intake/Output from previous day: 04/07 0701 - 04/08 0700 In: 1262.4 [P.O.:480; I.V.:782.4] Out: 375 [Urine:375] Intake/Output this shift: No intake/output data recorded.  Lab Results: Recent Labs    11/27/20 1133 11/28/20 0531  WBC 8.0 6.2  HGB 13.3 12.7  HCT 42.7 41.9  PLT 219 184   BMET Recent Labs    11/27/20 1133 11/28/20 0531  NA 140 143  K 3.7 4.4  CL 107 106  CO2 19* 27  GLUCOSE 128* 100*  BUN 8 8  CREATININE 0.81 0.85  CALCIUM 9.1 8.6*   LFT Recent Labs    11/27/20 1133 11/28/20 0531  PROT 6.9 6.2*  ALBUMIN 3.5 3.2*  AST 25 24  ALT 24 20  ALKPHOS 94 85  BILITOT 0.5 0.6   PT/INR Recent Labs    11/27/20 1133  LABPROT 13.3  INR 1.1     Studies/Results: CT Abdomen Pelvis W Contrast  Result Date: 11/27/2020 CLINICAL DATA:  History of cholecystectomy now with RIGHT upper quadrant pain. Equivocal ultrasound findings. EXAM: CT ABDOMEN AND PELVIS WITH CONTRAST TECHNIQUE: Multidetector CT imaging of the  abdomen and pelvis was performed using the standard protocol following bolus administration of intravenous contrast. CONTRAST:  14mL OMNIPAQUE IOHEXOL 300 MG/ML  SOLN COMPARISON:  February 16, 2019 FINDINGS: Lower chest: Tiny pulmonary nodule at the periphery of the RIGHT middle lobe 5 mm (image 2, series 4) not imaged on previous evaluations. No effusion. No consolidation. Hepatobiliary: Hepatic steatosis and signs of cirrhosis with lobular hepatic contours, RIGHT lobe atrophy and LEFT lobe hypertrophy. Portal vein is patent. Diminutive to the small RIGHT lobe. Mild variable perfusion throughout the posterior division of the RIGHT hepatic lobe, slightly increased enhancement as compared to the remainder of the liver. Signs of pneumobilia. No discrete lesion on venous phase imaging in the liver. No gross biliary duct distension but with pneumobilia following biliary reconstruction with hepatic to enteric anastomosis about the porta hepatis. Pancreas: Normal, without mass, inflammation or ductal dilatation. Spleen: Mildly lobular contour the spleen with mild splenic enlargement, top-normal size. Adrenals/Urinary Tract: Adrenal glands are normal. Symmetric renal enhancement. No hydronephrosis. Smooth contour of the collapsed urinary bladder. Stomach/Bowel: Stomach under distended limiting assessment. No acute gastrointestinal process. The appendix is normal. Diverticular disease of the sigmoid colon. Mild segmental colonic thickening in the area of the sigmoid colon on image 64, question of eccentric thickening along the LEFT lateral wall. Vascular/Lymphatic: Calcified and noncalcified atheromatous plaque of the abdominal aorta. No aneurysmal dilation. There is no gastrohepatic or hepatoduodenal ligament lymphadenopathy. No retroperitoneal or mesenteric lymphadenopathy.  Smooth contour of the IVC. No pelvic sidewall lymphadenopathy. Reproductive: Unremarkable Other: Remote postoperative changes and RIGHT subcostal region  likely related to prior cholecystectomy. Small periumbilical hernia containing fat. Thinning of the rectus musculature following remote surgery on the RIGHT. No ascites. Musculoskeletal: No acute bone finding. No destructive bone process. Spinal and hip degenerative changes. Signs of L5-S1 spinal fusion with similar appearance. IMPRESSION: 1. Pneumobilia with variable perfusion in the RIGHT hepatic lobe, seen in this patient with cirrhosis post biliary reconstruction with hepatic to enteric anastomosis. Pneumobilia is nonspecific. However, given hepatic to enteric anastomosis would correlate with any signs of cholangitis. 2. Hepatic steatosis and morphologic features of cirrhosis without gross evidence of portal hypertension, top-normal size spleen. 3. Mild segmental colonic thickening in the area of the sigmoid colon, question of eccentric thickening along the LEFT lateral wall. Diverticular disease with sequela of prior inflammation or ongoing low level diverticulitis is considered. Given the presence of potential eccentric thickening would suggest correlation with recent colonoscopy if available or with follow-up colonoscopy. 4. Tiny 5 mm pulmonary nodule at the periphery of the RIGHT middle lobe not imaged on previous evaluations. 5. Aortic atherosclerosis. Aortic Atherosclerosis (ICD10-I70.0). Electronically Signed   By: Zetta Bills M.D.   On: 11/27/2020 13:30   DG Chest Port 1 View  Result Date: 11/27/2020 CLINICAL DATA:  Shortness of breath EXAM: PORTABLE CHEST 1 VIEW COMPARISON:  February 25, 2018 FINDINGS: Lungs are clear. Heart size and pulmonary vascularity are normal. No adenopathy. Postoperative change noted in the lower cervical spine. IMPRESSION: Lungs clear.  Cardiac silhouette normal. Electronically Signed   By: Lowella Grip III M.D.   On: 11/27/2020 11:32   US Abdomen Limited RUQ (LIVER/GB)  Result Date: 11/27/2020 CLINICAL DATA:  Upper abdominal pain EXAM: ULTRASOUND ABDOMEN LIMITED RIGHT  UPPER QUADRANT COMPARISON:  Ultrasound right upper quadrant October 29, 2020 FINDINGS: Gallbladder: Surgically absent. Common bile duct: Diameter: 8 mm which may be within normal limits for post cholecystectomy state. No intrahepatic or extrahepatic biliary duct dilatation evident. Liver: No focal lesion identified. Liver echogenicity increased diffusely. Equivocal nodularity to the liver contour. Portal vein is patent on color Doppler imaging with normal direction of blood flow towards the liver. Other: None. IMPRESSION: Diffuse increased liver echogenicity with equivocal liver contour nodularity. Appearance is consistent with a degree of hepatic cirrhosis and potential underlying hepatic steatosis. No focal liver lesions evident. Note that the sensitivity of ultrasound for detection of focal liver lesions is diminished significantly in this circumstance. Gallbladder absent. Electronically Signed   By: Lowella Grip III M.D.   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 GI consulted due to pneumobilia.   CT abdomen pelvis this admission showed changes of cirrhosis, pneumobilia with prior biliary reconstruction with hepatic to enteric anastomosis felt to be nonspecific, and she has had pneumobilia on CTs dating back several years. Given her anatomy, this is expected. No evidence for cholangitis. As she has been taking NSAIDs, unable to rule out occult process and could benefit from EGD. As of note, she does have a long-standing history of chronic abdominal  pain. Last EGD/EUS  in May 2021 by Dr. Rush Landmark. Nodular mucosa in second portion of duodenum s/p biopsy at that time and wall-thickening of duodenum with plans for EGD/EUS surveillance in 1 year. Pathology was benign for dysplasia. MR abdomen on file as well June 2021 without suspicious findings.   Mild segmental sigmoid colon thickening: asymptomatic currently. Recommend outpatient colonoscopy as last was in 2014.   Last dose of Lovenox yesterday at 1700.   Will pursue diagnostic EGD today with Dr. Abbey Chatters. Patient is NPO.   Plan: EGD today with Dr. Abbey Chatters. Discussed risks and benefits  Continue PPI   Hold Lovenox until after procedure  Outpatient colonoscopy  Annitta Needs, PhD, ANP-BC [facility]      LOS: 1 day    11/28/2020, 9:48 AM

## 2020-11-28 NOTE — Anesthesia Preprocedure Evaluation (Signed)
Anesthesia Evaluation  Patient identified by MRN, date of birth, ID band Patient awake    Reviewed: Allergy & Precautions, H&P , NPO status , Patient's Chart, lab work & pertinent test results, reviewed documented beta blocker date and time   Airway Mallampati: II  TM Distance: >3 FB Neck ROM: full    Dental no notable dental hx.    Pulmonary shortness of breath, Current Smoker,    Pulmonary exam normal breath sounds clear to auscultation       Cardiovascular Exercise Tolerance: Good hypertension, negative cardio ROS   Rhythm:regular Rate:Normal     Neuro/Psych Seizures -, Well Controlled,  PSYCHIATRIC DISORDERS Anxiety Depression  Neuromuscular disease    GI/Hepatic GERD  Medicated,(+) Cirrhosis       , Hepatitis -, C  Endo/Other  negative endocrine ROS  Renal/GU negative Renal ROS  negative genitourinary   Musculoskeletal   Abdominal   Peds  Hematology  (+) Blood dyscrasia, anemia ,   Anesthesia Other Findings   Reproductive/Obstetrics negative OB ROS                             Anesthesia Physical Anesthesia Plan  ASA: III and emergent  Anesthesia Plan: General   Post-op Pain Management:    Induction:   PONV Risk Score and Plan: Propofol infusion  Airway Management Planned:   Additional Equipment:   Intra-op Plan:   Post-operative Plan:   Informed Consent: I have reviewed the patients History and Physical, chart, labs and discussed the procedure including the risks, benefits and alternatives for the proposed anesthesia with the patient or authorized representative who has indicated his/her understanding and acceptance.     Dental Advisory Given  Plan Discussed with: CRNA  Anesthesia Plan Comments:         Anesthesia Quick Evaluation

## 2020-11-28 NOTE — Progress Notes (Signed)
PROGRESS NOTE    Sabrina Wyatt  WJX:[id number] DOB: 04-16-1951 DOA: 11/27/2020 PCP: Lucia Gaskins, MD   Brief Narrative:   Sabrina Wyatt is a 70 y.o. female with medical history significant for hypertension, dyslipidemia, chronic abdominal pain with prior cholecystectomy, and cirrhosis with prior hep C who presented to the ED via EMS with complaints of acute on chronic right upper quadrant abdominal pain that began approximately 2 days ago.  Patient was noted to have pneumobilia on CT scan and is scheduled to undergo EGD with GI on 4/8.  Assessment & Plan:   Active Problems:   Pneumobilia   Acute on chronic right upper quadrant abdominal pain with pneumobilia -Keep n.p.o. -IV fluid -Pain management with IV Dilaudid as needed -Appreciate GI evaluation  History of liver cirrhosis with prior hep C and alcohol use -Denies any significant alcohol abuse at this time  History of hypertension -Continue atenolol  Chronic pain/depression -Continue home medications  Dyslipidemia -Plan to hold statin for now   DVT prophylaxis: Lovenox Code Status: Full Family Communication: Patient will call Disposition Plan:  Status is: Inpatient  Remains inpatient appropriate because:Ongoing diagnostic testing needed not appropriate for outpatient work up, IV treatments appropriate due to intensity of illness or inability to take PO and Inpatient level of care appropriate due to severity of illness   Dispo: The patient is from: Home              Anticipated d/c is to: Home              Patient currently is not medically stable to d/c.   Difficult to place patient No  Consultants:   GI  Procedures:   See below  Antimicrobials:   None   Subjective: Patient seen and evaluated today with ongoing intermittent abdominal pain that she describes as similar to a "toothache."  She did not sleep too well overnight.  She denies any nausea and vomiting or  diarrhea.  Objective: Vitals:   11/28/20 0600 11/28/20 0807 11/28/20 0808 11/28/20 0814  BP: 136/74     Pulse: 74     Resp: 20     Temp: 99.1 F (37.3 C)     TempSrc: Oral     SpO2: 97% 92% 100% 98%  Weight:      Height:        Intake/Output Summary (Last 24 hours) at 11/28/2020 1106 Last data filed at 11/28/2020 0422 Gross per 24 hour  Intake 1262.4 ml  Output 375 ml  Net 887.4 ml   Filed Weights   11/27/20 1057 11/27/20 1653  Weight: 78.5 kg 81.2 kg    Examination:  General exam: Appears calm and comfortable  Respiratory system: Clear to auscultation. Respiratory effort normal. Cardiovascular system: S1 & S2 heard, RRR.  Gastrointestinal system: Abdomen is soft, tender to palpation in the right upper quadrant Central nervous system: Alert and awake Extremities: No edema Skin: No significant lesions noted Psychiatry: Flat affect.    Data Reviewed: I have personally reviewed following labs and imaging studies  CBC: Recent Labs  Lab 11/27/20 1133 11/28/20 0531  WBC 8.0 6.2  NEUTROABS 4.2  --   HGB 13.3 12.7  HCT 42.7 41.9  MCV 90.5 92.7  PLT 219 213   Basic Metabolic Panel: Recent Labs  Lab 11/27/20 1133 11/28/20 0531  NA 140 143  K 3.7 4.4  CL 107 106  CO2 19* 27  GLUCOSE 128* 100*  BUN 8 8  CREATININE 0.81 0.85  CALCIUM 9.1 8.6*  MG  --  2.1   GFR: Estimated Creatinine Clearance: 65.8 mL/min (by C-G formula based on SCr of 0.85 mg/dL). Liver Function Tests: Recent Labs  Lab 11/27/20 1133 11/28/20 0531  AST 25 24  ALT 24 20  ALKPHOS 94 85  BILITOT 0.5 0.6  PROT 6.9 6.2*  ALBUMIN 3.5 3.2*   Recent Labs  Lab 11/27/20 1133  LIPASE 42   No results for input(s): AMMONIA in the last 168 hours. Coagulation Profile: Recent Labs  Lab 11/27/20 1133  INR 1.1   Cardiac Enzymes: No results for input(s): CKTOTAL, CKMB, CKMBINDEX, TROPONINI in the last 168 hours. BNP (last 3 results) No results for input(s): PROBNP in the last 8760  hours. HbA1C: No results for input(s): HGBA1C in the last 72 hours. CBG: No results for input(s): GLUCAP in the last 168 hours. Lipid Profile: No results for input(s): CHOL, HDL, LDLCALC, TRIG, CHOLHDL, LDLDIRECT in the last 72 hours. Thyroid Function Tests: No results for input(s): TSH, T4TOTAL, FREET4, T3FREE, THYROIDAB in the last 72 hours. Anemia Panel: No results for input(s): VITAMINB12, FOLATE, FERRITIN, TIBC, IRON, RETICCTPCT in the last 72 hours. Sepsis Labs: No results for input(s): PROCALCITON, LATICACIDVEN in the last 168 hours.  Recent Results (from the past 240 hour(s))  SARS CORONAVIRUS 2 (TAT 6-24 HRS) Nasopharyngeal Nasopharyngeal Swab     Status: None   Collection Time: 11/27/20  2:26 PM   Specimen: Nasopharyngeal Swab  Result Value Ref Range Status   SARS Coronavirus 2 NEGATIVE NEGATIVE Final    Comment: (NOTE) SARS-CoV-2 target nucleic acids are NOT DETECTED.  The SARS-CoV-2 RNA is generally detectable in upper and lower respiratory specimens during the acute phase of infection. Negative results do not preclude SARS-CoV-2 infection, do not rule out co-infections with other pathogens, and should not be used as the sole basis for treatment or other patient management decisions. Negative results must be combined with clinical observations, patient history, and epidemiological information. The expected result is Negative.  Fact Sheet for Patients: SugarRoll.be  Fact Sheet for Healthcare Providers: https://www.woods-mathews.com/  This test is not yet approved or cleared by the [place] FDA and  has been authorized for detection and/or diagnosis of SARS-CoV-2 by FDA under an Emergency Use Authorization (EUA). This EUA will remain  in effect (meaning this test can be used) for the duration of the COVID-19 declaration under Se ction 564(b)(1) of the Act, 21 U.S.C. section 360bbb-3(b)(1), unless the authorization is  terminated or revoked sooner.  Performed at [facility], [place] [address], [place], [place] [address]          Radiology Studies: CT Abdomen Pelvis W Contrast  Result Date: 11/27/2020 CLINICAL DATA:  History of cholecystectomy now with RIGHT upper quadrant pain. Equivocal ultrasound findings. EXAM: CT ABDOMEN AND PELVIS WITH CONTRAST TECHNIQUE: Multidetector CT imaging of the abdomen and pelvis was performed using the standard protocol following bolus administration of intravenous contrast. CONTRAST:  153mL OMNIPAQUE IOHEXOL 300 MG/ML  SOLN COMPARISON:  February 16, 2019 FINDINGS: Lower chest: Tiny pulmonary nodule at the periphery of the RIGHT middle lobe 5 mm (image 2, series 4) not imaged on previous evaluations. No effusion. No consolidation. Hepatobiliary: Hepatic steatosis and signs of cirrhosis with lobular hepatic contours, RIGHT lobe atrophy and LEFT lobe hypertrophy. Portal vein is patent. Diminutive to the small RIGHT lobe. Mild variable perfusion throughout the posterior division of the RIGHT hepatic lobe, slightly increased enhancement as compared to the remainder of the  liver. Signs of pneumobilia. No discrete lesion on venous phase imaging in the liver. No gross biliary duct distension but with pneumobilia following biliary reconstruction with hepatic to enteric anastomosis about the porta hepatis. Pancreas: Normal, without mass, inflammation or ductal dilatation. Spleen: Mildly lobular contour the spleen with mild splenic enlargement, top-normal size. Adrenals/Urinary Tract: Adrenal glands are normal. Symmetric renal enhancement. No hydronephrosis. Smooth contour of the collapsed urinary bladder. Stomach/Bowel: Stomach under distended limiting assessment. No acute gastrointestinal process. The appendix is normal. Diverticular disease of the sigmoid colon. Mild segmental colonic thickening in the area of the sigmoid colon on image 64, question of eccentric thickening along the LEFT  lateral wall. Vascular/Lymphatic: Calcified and noncalcified atheromatous plaque of the abdominal aorta. No aneurysmal dilation. There is no gastrohepatic or hepatoduodenal ligament lymphadenopathy. No retroperitoneal or mesenteric lymphadenopathy. Smooth contour of the IVC. No pelvic sidewall lymphadenopathy. Reproductive: Unremarkable Other: Remote postoperative changes and RIGHT subcostal region likely related to prior cholecystectomy. Small periumbilical hernia containing fat. Thinning of the rectus musculature following remote surgery on the RIGHT. No ascites. Musculoskeletal: No acute bone finding. No destructive bone process. Spinal and hip degenerative changes. Signs of L5-S1 spinal fusion with similar appearance. IMPRESSION: 1. Pneumobilia with variable perfusion in the RIGHT hepatic lobe, seen in this patient with cirrhosis post biliary reconstruction with hepatic to enteric anastomosis. Pneumobilia is nonspecific. However, given hepatic to enteric anastomosis would correlate with any signs of cholangitis. 2. Hepatic steatosis and morphologic features of cirrhosis without gross evidence of portal hypertension, top-normal size spleen. 3. Mild segmental colonic thickening in the area of the sigmoid colon, question of eccentric thickening along the LEFT lateral wall. Diverticular disease with sequela of prior inflammation or ongoing low level diverticulitis is considered. Given the presence of potential eccentric thickening would suggest correlation with recent colonoscopy if available or with follow-up colonoscopy. 4. Tiny 5 mm pulmonary nodule at the periphery of the RIGHT middle lobe not imaged on previous evaluations. 5. Aortic atherosclerosis. Aortic Atherosclerosis (ICD10-I70.0). Electronically Signed   By: Zetta Bills M.D.   On: 11/27/2020 13:30   DG Chest Port 1 View  Result Date: 11/27/2020 CLINICAL DATA:  Shortness of breath EXAM: PORTABLE CHEST 1 VIEW COMPARISON:  February 25, 2018 FINDINGS: Lungs  are clear. Heart size and pulmonary vascularity are normal. No adenopathy. Postoperative change noted in the lower cervical spine. IMPRESSION: Lungs clear.  Cardiac silhouette normal. Electronically Signed   By: Lowella Grip III M.D.   On: 11/27/2020 11:32   US Abdomen Limited RUQ (LIVER/GB)  Result Date: 11/27/2020 CLINICAL DATA:  Upper abdominal pain EXAM: ULTRASOUND ABDOMEN LIMITED RIGHT UPPER QUADRANT COMPARISON:  Ultrasound right upper quadrant October 29, 2020 FINDINGS: Gallbladder: Surgically absent. Common bile duct: Diameter: 8 mm which may be within normal limits for post cholecystectomy state. No intrahepatic or extrahepatic biliary duct dilatation evident. Liver: No focal lesion identified. Liver echogenicity increased diffusely. Equivocal nodularity to the liver contour. Portal vein is patent on color Doppler imaging with normal direction of blood flow towards the liver. Other: None. IMPRESSION: Diffuse increased liver echogenicity with equivocal liver contour nodularity. Appearance is consistent with a degree of hepatic cirrhosis and potential underlying hepatic steatosis. No focal liver lesions evident. Note that the sensitivity of ultrasound for detection of focal liver lesions is diminished significantly in this circumstance. Gallbladder absent. Electronically Signed   By: Lowella Grip III M.D.   On: 11/27/2020 11:34        Scheduled Meds: . atenolol  25  mg Oral q morning  . brexpiprazole  1 mg Oral Daily  . budesonide (PULMICORT) nebulizer solution  0.25 mg Nebulization BID  . DULoxetine  60 mg Oral Daily  . enoxaparin (LOVENOX) injection  40 mg Subcutaneous Q24H  . fluticasone  1 spray Each Nare BID  . gabapentin  300 mg Oral TID  . loratadine  10 mg Oral Daily  . nicotine  7 mg Transdermal Daily  . oxyCODONE  5 mg Oral QID  . pantoprazole  40 mg Oral Daily  . tiZANidine  4 mg Oral Daily  . umeclidinium-vilanterol  1 puff Inhalation Daily   Continuous Infusions: .  lactated ringers 75 mL/hr at 11/28/20 0738     LOS: 1 day    Time spent: 35 minutes    Rosalynn Sergent D Manuella Ghazi, DO [facility]  If 7PM-7AM, please contact night-coverage [email or web address] 11/28/2020, 11:06 AM

## 2020-11-29 LAB — COMPREHENSIVE METABOLIC PANEL
ALT: 18 U/L (ref 0–44)
AST: 23 U/L (ref 15–41)
Albumin: 3.2 g/dL — ABNORMAL LOW (ref 3.5–5.0)
Alkaline Phosphatase: 86 U/L (ref 38–126)
Anion gap: 9 (ref 5–15)
BUN: 10 mg/dL (ref 8–23)
CO2: 27 mmol/L (ref 22–32)
Calcium: 8.7 mg/dL — ABNORMAL LOW (ref 8.9–10.3)
Chloride: 108 mmol/L (ref 98–111)
Creatinine, Ser: 0.82 mg/dL (ref 0.44–1.00)
GFR, Estimated: >60 mL/min (ref >60)
Glucose, Bld: 102 mg/dL — ABNORMAL HIGH (ref 70–99)
Potassium: 4.4 mmol/L (ref 3.5–5.1)
Sodium: 144 mmol/L (ref 135–145)
Total Bilirubin: 0.4 mg/dL (ref 0.3–1.2)
Total Protein: 6.2 g/dL — ABNORMAL LOW (ref 6.5–8.1)

## 2020-11-29 LAB — MAGNESIUM: Magnesium: 2.1 mg/dL (ref 1.7–2.4)

## 2020-11-29 LAB — CBC
HCT: 39.8 % (ref 36.0–46.0)
Hemoglobin: 12 g/dL (ref 12.0–15.0)
MCH: 28 pg (ref 26.0–34.0)
MCHC: 30.2 g/dL (ref 30.0–36.0)
MCV: 92.8 fL (ref 80.0–100.0)
Platelets: 156 10*3/uL (ref 150–400)
RBC: 4.29 MIL/uL (ref 3.87–5.11)
RDW: 16.2 % — ABNORMAL HIGH (ref 11.5–15.5)
WBC: 4.9 10*3/uL (ref 4.0–10.5)
nRBC: 0 % (ref 0.0–0.2)

## 2020-11-29 MED ORDER — ONDANSETRON HCL 4 MG PO TABS: 4.0000 mg | ORAL_TABLET | Freq: Four times a day (QID) | ORAL | 0 refills | Status: AC | PRN

## 2020-11-29 MED ORDER — OXYCODONE HCL 5 MG PO TABS
5.0000 mg | ORAL_TABLET | Freq: Four times a day (QID) | ORAL | Status: DC
  Administered 2020-11-29: 5 mg via ORAL
  Filled 2020-11-29: qty 1

## 2020-11-29 MED ORDER — OXYCODONE HCL 7.5 MG PO TABS: 7.5000 mg | ORAL_TABLET | Freq: Four times a day (QID) | ORAL | 0 refills | Status: AC

## 2020-11-29 MED ORDER — PANTOPRAZOLE SODIUM 40 MG PO TBEC: 40.0000 mg | DELAYED_RELEASE_TABLET | Freq: Two times a day (BID) | ORAL | 0 refills | Status: AC

## 2020-11-29 MED ORDER — PANTOPRAZOLE SODIUM 40 MG PO TBEC
40.0000 mg | DELAYED_RELEASE_TABLET | Freq: Two times a day (BID) | ORAL | Status: DC
  Administered 2020-11-29: 40 mg via ORAL
  Filled 2020-11-29: qty 1

## 2020-11-29 MED ORDER — NICOTINE 7 MG/24HR TD PT24: 7.0000 mg | MEDICATED_PATCH | Freq: Every day | TRANSDERMAL | 0 refills | Status: AC

## 2020-11-29 NOTE — Progress Notes (Signed)
IV removed and discharge instructions reviewed and scripts sent pharmacy.  To call for follow up appts. Transported to main entrance for ride home

## 2020-11-29 NOTE — Discharge Summary (Signed)
Physician Discharge Summary  Sabrina Wyatt PXT:[id number] DOB: 07-03-1951 DOA: 11/27/2020  PCP: Lucia Gaskins, MD  Admit date: 11/27/2020  Discharge date: 11/29/2020  Admitted From:Home  Disposition:  Home  Recommendations for Outpatient Follow-up:  1. Follow up with PCP in 1-2 weeks 2. Follow-up with GI outpatient to follow-up on EGD results/gastritis pneumobilia which will be scheduled 3. Follow-up with ID with referral sent given HIV positive screening noted 4. Remain on oxycodone 7.5 mg 4 times a day as prescribed for 20 tablets and 0 refills, patient will need to have further follow-up with PCP regarding pain management 5. Continue other home medications as prior  Home Health: None  Equipment/Devices: None  Discharge Condition:Stable  CODE STATUS: Full  Diet recommendation: Heart Healthy  Brief/Interim Summary: Sabrina Khurana Gibsonis a 70 y.o.femalewith medical history significant forhypertension, dyslipidemia, chronic abdominal pain with prior cholecystectomy, and cirrhosis with prior hep Cwho presented to the ED via EMS with complaints of acute on chronic right upper quadrant abdominal pain that began approximately 2 days ago.  Patient was noted to have pneumobilia on CT scan and has undergone EGD with findings of gastritis noted on 4/8.  No other acute concerns or findings were noted.  Patient has been seen by GI and she continues to have her chronic, intermittent abdominal pain that does not appear to be related to any new acute process.  She has been recommended to remain off NSAIDs which have now been discontinued and as a result her home oxycodone dose has been increased from 5mg  to 7.5 mg to help with overall pain control.  20 tablets have been prescribed with 3 refills and will need further follow-up with PCP.  Additionally, she was noted to have a positive HIV screen during the stay and will require outpatient follow-up with ID for further confirmation and counseling.  No  other acute events noted during the stay and she is stable for discharge.  Discharge Diagnoses:  Active Problems:   Pneumobilia  Principal discharge diagnosis: Acute on chronic right upper quadrant abdominal pain in the setting of pneumobilia.  Noted gastritis on EGD.  Positive on HIV screening test.  Discharge Instructions  Discharge Instructions    Ambulatory referral to Gastroenterology   Complete by: As directed    Ambulatory referral to Infectious Disease   Complete by: As directed    Diet - low sodium heart healthy   Complete by: As directed    Increase activity slowly   Complete by: As directed      Allergies as of 11/29/2020      Reactions   Asa [aspirin] Nausea Only   Bee Venom Hives   Codeine Nausea Only   Tylenol [acetaminophen] Nausea And Vomiting, Swelling   Swelling of hands and feet      Medication List    STOP taking these medications   etodolac 400 MG tablet Commonly known as: LODINE   meloxicam 15 MG tablet Commonly known as: MOBIC   naproxen 500 MG tablet Commonly known as: NAPROSYN     TAKE these medications   acyclovir 400 MG tablet Commonly known as: ZOVIRAX TAKE 1 TABLET BY MOUTH TWICE DAILY.   ascorbic acid 250 MG Chew Commonly known as: VITAMIN C Chew 250 mg by mouth daily.   atenolol 25 MG tablet Commonly known as: TENORMIN Take 25 mg by mouth every morning.   brexpiprazole 1 MG Tabs tablet Commonly known as: REXULTI Take 1 mg by mouth daily.   Breztri Aerosphere 160-9-4.8 MCG/ACT  Aero Generic drug: Budeson-Glycopyrrol-Formoterol Inhale 2 puffs into the lungs 2 (two) times daily.   DULoxetine 60 MG capsule Commonly known as: CYMBALTA Take 60 mg by mouth daily.   EpiPen 2-Pak 0.3 mg/0.3 mL Soaj injection Generic drug: EPINEPHrine Inject 0.3 mg into the skin as needed (allergic reactions).   fluticasone 50 MCG/ACT nasal spray Commonly known as: FLONASE Place 1 spray into both nostrils 2 (two) times daily.   gabapentin  300 MG capsule Commonly known as: NEURONTIN Take 300 mg by mouth 3 (three) times daily.   loratadine 10 MG tablet Commonly known as: CLARITIN Take 10 mg by mouth daily.   nicotine 7 mg/24hr patch Commonly known as: NICODERM CQ - dosed in mg/24 hr Place 1 patch (7 mg total) onto the skin daily. Start taking on: November 30, 2020   omeprazole 20 MG capsule Commonly known as: PRILOSEC TAKE 2 CAPSULES BY MOUTH TWICE DAILY BEFORE A MEAL. What changed: See the new instructions.   ondansetron 4 MG tablet Commonly known as: ZOFRAN Take 1 tablet (4 mg total) by mouth every 6 (six) hours as needed for nausea.   oxyCODONE HCl 7.5 MG Tabs Take 7.5 mg by mouth 4 (four) times daily. What changed:   medication strength  how much to take   pantoprazole 40 MG tablet Commonly known as: PROTONIX Take 1 tablet (40 mg total) by mouth 2 (two) times daily.   pravastatin 40 MG tablet Commonly known as: PRAVACHOL Take 40 mg by mouth daily.   ProAir HFA 108 (90 Base) MCG/ACT inhaler Generic drug: albuterol Inhale 2 puffs into the lungs every 6 (six) hours as needed for wheezing or shortness of breath.   albuterol (2.5 MG/3ML) 0.083% nebulizer solution Commonly known as: PROVENTIL Take 2.5 mg by nebulization 2 (two) times daily as needed for wheezing or shortness of breath.   tiZANidine 2 MG tablet Commonly known as: ZANAFLEX Take 4 mg by mouth daily.   Vitamin D 50 MCG (2000 UT) Caps Take 10,000 Units by mouth daily.       Follow-up Information    Lucia Gaskins, MD. Schedule an appointment as soon as possible for a visit in 1 week(s).   Specialty: Internal Medicine Contact information: [place] [address] [facility] [employer] Follow up in 4 week(s).   Contact information: [place] [phone]       [facility]             . Schedule an appointment  as soon as possible for a visit in 2 week(s).   Contact information: [place] [address]             Allergies  Allergen Reactions  . Asa [Aspirin] Nausea Only  . Bee Venom Hives  . Codeine Nausea Only  . Tylenol [Acetaminophen] Nausea And Vomiting and Swelling    Swelling of hands and feet    Consultations:  GI   Procedures/Studies: CT Abdomen Pelvis W Contrast  Result Date: 11/27/2020 CLINICAL DATA:  History of cholecystectomy now with RIGHT upper quadrant pain. Equivocal ultrasound findings. EXAM: CT ABDOMEN AND PELVIS WITH CONTRAST TECHNIQUE: Multidetector CT imaging of the abdomen and pelvis was performed using the standard protocol following bolus administration of intravenous contrast. CONTRAST:  141mL OMNIPAQUE IOHEXOL 300 MG/ML  SOLN COMPARISON:  February 16, 2019 FINDINGS: Lower chest: Tiny pulmonary nodule at  the periphery of the RIGHT middle lobe 5 mm (image 2, series 4) not imaged on previous evaluations. No effusion. No consolidation. Hepatobiliary: Hepatic steatosis and signs of cirrhosis with lobular hepatic contours, RIGHT lobe atrophy and LEFT lobe hypertrophy. Portal vein is patent. Diminutive to the small RIGHT lobe. Mild variable perfusion throughout the posterior division of the RIGHT hepatic lobe, slightly increased enhancement as compared to the remainder of the liver. Signs of pneumobilia. No discrete lesion on venous phase imaging in the liver. No gross biliary duct distension but with pneumobilia following biliary reconstruction with hepatic to enteric anastomosis about the porta hepatis. Pancreas: Normal, without mass, inflammation or ductal dilatation. Spleen: Mildly lobular contour the spleen with mild splenic enlargement, top-normal size. Adrenals/Urinary Tract: Adrenal glands are normal. Symmetric renal enhancement. No hydronephrosis. Smooth contour of the collapsed urinary bladder. Stomach/Bowel: Stomach under  distended limiting assessment. No acute gastrointestinal process. The appendix is normal. Diverticular disease of the sigmoid colon. Mild segmental colonic thickening in the area of the sigmoid colon on image 64, question of eccentric thickening along the LEFT lateral wall. Vascular/Lymphatic: Calcified and noncalcified atheromatous plaque of the abdominal aorta. No aneurysmal dilation. There is no gastrohepatic or hepatoduodenal ligament lymphadenopathy. No retroperitoneal or mesenteric lymphadenopathy. Smooth contour of the IVC. No pelvic sidewall lymphadenopathy. Reproductive: Unremarkable Other: Remote postoperative changes and RIGHT subcostal region likely related to prior cholecystectomy. Small periumbilical hernia containing fat. Thinning of the rectus musculature following remote surgery on the RIGHT. No ascites. Musculoskeletal: No acute bone finding. No destructive bone process. Spinal and hip degenerative changes. Signs of L5-S1 spinal fusion with similar appearance. IMPRESSION: 1. Pneumobilia with variable perfusion in the RIGHT hepatic lobe, seen in this patient with cirrhosis post biliary reconstruction with hepatic to enteric anastomosis. Pneumobilia is nonspecific. However, given hepatic to enteric anastomosis would correlate with any signs of cholangitis. 2. Hepatic steatosis and morphologic features of cirrhosis without gross evidence of portal hypertension, top-normal size spleen. 3. Mild segmental colonic thickening in the area of the sigmoid colon, question of eccentric thickening along the LEFT lateral wall. Diverticular disease with sequela of prior inflammation or ongoing low level diverticulitis is considered. Given the presence of potential eccentric thickening would suggest correlation with recent colonoscopy if available or with follow-up colonoscopy. 4. Tiny 5 mm pulmonary nodule at the periphery of the RIGHT middle lobe not imaged on previous evaluations. 5. Aortic atherosclerosis.  Aortic Atherosclerosis (ICD10-I70.0). Electronically Signed   By: Zetta Bills M.D.   On: 11/27/2020 13:30   DG Chest Port 1 View  Result Date: 11/27/2020 CLINICAL DATA:  Shortness of breath EXAM: PORTABLE CHEST 1 VIEW COMPARISON:  February 25, 2018 FINDINGS: Lungs are clear. Heart size and pulmonary vascularity are normal. No adenopathy. Postoperative change noted in the lower cervical spine. IMPRESSION: Lungs clear.  Cardiac silhouette normal. Electronically Signed   By: Lowella Grip III M.D.   On: 11/27/2020 11:32   US Abdomen Limited RUQ (LIVER/GB)  Result Date: 11/27/2020 CLINICAL DATA:  Upper abdominal pain EXAM: ULTRASOUND ABDOMEN LIMITED RIGHT UPPER QUADRANT COMPARISON:  Ultrasound right upper quadrant October 29, 2020 FINDINGS: Gallbladder: Surgically absent. Common bile duct: Diameter: 8 mm which may be within normal limits for post cholecystectomy state. No intrahepatic or extrahepatic biliary duct dilatation evident. Liver: No focal lesion identified. Liver echogenicity increased diffusely. Equivocal nodularity to the liver contour. Portal vein is patent on color Doppler imaging with normal direction of blood flow towards the liver. Other: None. IMPRESSION: Diffuse increased liver echogenicity  with equivocal liver contour nodularity. Appearance is consistent with a degree of hepatic cirrhosis and potential underlying hepatic steatosis. No focal liver lesions evident. Note that the sensitivity of ultrasound for detection of focal liver lesions is diminished significantly in this circumstance. Gallbladder absent. Electronically Signed   By: Lowella Grip III M.D.   On: 11/27/2020 11:34      Discharge Exam: Vitals:   11/29/20 0619 11/29/20 0820  BP: 131/67   Pulse: 80   Resp: 18   Temp: 97.6 F (36.4 C)   SpO2: 96% 94%   Vitals:   11/28/20 1934 11/28/20 2052 11/29/20 0619 11/29/20 0820  BP:  134/72 131/67   Pulse:  81 80   Resp:  18 18   Temp:  99.1 F (37.3 C) 97.6 F (36.4  C)   TempSrc:  Oral    SpO2: 94% 97% 96% 94%  Weight:      Height:        General: Pt is alert, awake, not in acute distress Cardiovascular: RRR, S1/S2 +, no rubs, no gallops Respiratory: CTA bilaterally, no wheezing, no rhonchi Abdominal: Soft, NT, ND, bowel sounds + Extremities: no edema, no cyanosis    The results of significant diagnostics from this hospitalization (including imaging, microbiology, ancillary and laboratory) are listed below for reference.     Microbiology: Recent Results (from the past 240 hour(s))  SARS CORONAVIRUS 2 (TAT 6-24 HRS) Nasopharyngeal Nasopharyngeal Swab     Status: None   Collection Time: 11/27/20  2:26 PM   Specimen: Nasopharyngeal Swab  Result Value Ref Range Status   SARS Coronavirus 2 NEGATIVE NEGATIVE Final    Comment: (NOTE) SARS-CoV-2 target nucleic acids are NOT DETECTED.  The SARS-CoV-2 RNA is generally detectable in upper and lower respiratory specimens during the acute phase of infection. Negative results do not preclude SARS-CoV-2 infection, do not rule out co-infections with other pathogens, and should not be used as the sole basis for treatment or other patient management decisions. Negative results must be combined with clinical observations, patient history, and epidemiological information. The expected result is Negative.  Fact Sheet for Patients: SugarRoll.be  Fact Sheet for Healthcare Providers: https://www.woods-mathews.com/  This test is not yet approved or cleared by the [place] FDA and  has been authorized for detection and/or diagnosis of SARS-CoV-2 by FDA under an Emergency Use Authorization (EUA). This EUA will remain  in effect (meaning this test can be used) for the duration of the COVID-19 declaration under Se ction 564(b)(1) of the Act, 21 U.S.C. section 360bbb-3(b)(1), unless the authorization is terminated or revoked sooner.  Performed at [facility], [place] [address], [place], [place] [address]      Labs: BNP (last 3 results) No results for input(s): BNP in the last 8760 hours. Basic Metabolic Panel: Recent Labs  Lab 11/27/20 1133 11/28/20 0531 11/29/20 0620  NA 140 143 144  K 3.7 4.4 4.4  CL 107 106 108  CO2 19* 27 27  GLUCOSE 128* 100* 102*  BUN 8 8 10   CREATININE 0.81 0.85 0.82  CALCIUM 9.1 8.6* 8.7*  MG  --  2.1 2.1   Liver Function Tests: Recent Labs  Lab 11/27/20 1133 11/28/20 0531 11/29/20 0620  AST 25 24 23   ALT 24 20 18   ALKPHOS 94 85 86  BILITOT 0.5 0.6 0.4  PROT 6.9 6.2* 6.2*  ALBUMIN 3.5 3.2* 3.2*   Recent Labs  Lab 11/27/20 1133  LIPASE 42   No results  for input(s): AMMONIA in the last 168 hours. CBC: Recent Labs  Lab 11/27/20 1133 11/28/20 0531 11/29/20 0620  WBC 8.0 6.2 4.9  NEUTROABS 4.2  --   --   HGB 13.3 12.7 12.0  HCT 42.7 41.9 39.8  MCV 90.5 92.7 92.8  PLT 219 184 156   Cardiac Enzymes: No results for input(s): CKTOTAL, CKMB, CKMBINDEX, TROPONINI in the last 168 hours. BNP: Invalid input(s): POCBNP CBG: No results for input(s): GLUCAP in the last 168 hours. D-Dimer No results for input(s): DDIMER in the last 72 hours. Hgb A1c No results for input(s): HGBA1C in the last 72 hours. Lipid Profile No results for input(s): CHOL, HDL, LDLCALC, TRIG, CHOLHDL, LDLDIRECT in the last 72 hours. Thyroid function studies No results for input(s): TSH, T4TOTAL, T3FREE, THYROIDAB in the last 72 hours.  Invalid input(s): FREET3 Anemia work up No results for input(s): VITAMINB12, FOLATE, FERRITIN, TIBC, IRON, RETICCTPCT in the last 72 hours. Urinalysis    Component Value Date/Time   COLORURINE YELLOW 07/15/2018 1045   APPEARANCEUR HAZY (A) 07/15/2018 1045   APPEARANCEUR Cloudy (A) 11/19/2014 1543   LABSPEC 1.008 07/15/2018 1045   PHURINE 5.0 07/15/2018 1045   GLUCOSEU NEGATIVE 07/15/2018 1045   HGBUR NEGATIVE 07/15/2018 [place] 07/15/2018 1045    BILIRUBINUR Negative 11/19/2014 1543   KETONESUR NEGATIVE 07/15/2018 1045   PROTEINUR NEGATIVE 07/15/2018 1045   UROBILINOGEN 1.0 11/01/2013 1035   NITRITE NEGATIVE 07/15/2018 1045   LEUKOCYTESUR NEGATIVE 07/15/2018 1045   LEUKOCYTESUR Negative 11/19/2014 1543   Sepsis Labs Invalid input(s): PROCALCITONIN,  WBC,  LACTICIDVEN Microbiology Recent Results (from the past 240 hour(s))  SARS CORONAVIRUS 2 (TAT 6-24 HRS) Nasopharyngeal Nasopharyngeal Swab     Status: None   Collection Time: 11/27/20  2:26 PM   Specimen: Nasopharyngeal Swab  Result Value Ref Range Status   SARS Coronavirus 2 NEGATIVE NEGATIVE Final    Comment: (NOTE) SARS-CoV-2 target nucleic acids are NOT DETECTED.  The SARS-CoV-2 RNA is generally detectable in upper and lower respiratory specimens during the acute phase of infection. Negative results do not preclude SARS-CoV-2 infection, do not rule out co-infections with other pathogens, and should not be used as the sole basis for treatment or other patient management decisions. Negative results must be combined with clinical observations, patient history, and epidemiological information. The expected result is Negative.  Fact Sheet for Patients: SugarRoll.be  Fact Sheet for Healthcare Providers: https://www.woods-mathews.com/  This test is not yet approved or cleared by the [place] FDA and  has been authorized for detection and/or diagnosis of SARS-CoV-2 by FDA under an Emergency Use Authorization (EUA). This EUA will remain  in effect (meaning this test can be used) for the duration of the COVID-19 declaration under Se ction 564(b)(1) of the Act, 21 U.S.C. section 360bbb-3(b)(1), unless the authorization is terminated or revoked sooner.  Performed at [facility], [place] [address] [place], [place] [address]      Time coordinating discharge: 35 minutes  SIGNED:   Rodena Goldmann, DO [facility] 11/29/2020, 10:31 AM  If 7PM-7AM, please contact night-coverage [email or web address]

## 2020-12-01 ENCOUNTER — Encounter: Payer: Self-pay | Admitting: Internal Medicine

## 2020-12-02 LAB — SURGICAL PATHOLOGY

## 2020-12-03 ENCOUNTER — Encounter (HOSPITAL_COMMUNITY): Payer: Self-pay | Admitting: Internal Medicine

## 2020-12-11 ENCOUNTER — Ambulatory Visit: Payer: 59 | Admitting: Family

## 2020-12-11 ENCOUNTER — Telehealth: Payer: Self-pay

## 2020-12-11 ENCOUNTER — Other Ambulatory Visit (HOSPITAL_COMMUNITY): Payer: Self-pay

## 2020-12-11 NOTE — Telephone Encounter (Signed)
[facility] Patient Teacher, English as a foreign language completed.    The patient is insured through [employer] and has a $0.00 copay.  We will continue to follow to see if copay assistance is needed.  Ileene Patrick, [facility] Patient [facility] Phone: [phone] Fax:  [phone]

## 2020-12-18 ENCOUNTER — Ambulatory Visit: Payer: 59 | Admitting: Family

## 2020-12-22 ENCOUNTER — Other Ambulatory Visit: Payer: Self-pay

## 2020-12-22 ENCOUNTER — Encounter: Payer: Self-pay | Admitting: Infectious Disease

## 2020-12-22 ENCOUNTER — Other Ambulatory Visit (HOSPITAL_COMMUNITY)
Admission: RE | Admit: 2020-12-22 | Discharge: 2020-12-22 | Disposition: A | Discharge status: Home / Self Care | Payer: 59 | Source: Ambulatory Visit | Attending: Infectious Disease | Admitting: Infectious Disease

## 2020-12-22 ENCOUNTER — Ambulatory Visit (INDEPENDENT_AMBULATORY_CARE_PROVIDER_SITE_OTHER): Payer: 59 | Admitting: Infectious Disease

## 2020-12-22 VITALS — BP 143/83 | HR 91 | Resp 16 | Ht 65.0 in | Wt 188.0 lb

## 2020-12-22 DIAGNOSIS — A549 Gonococcal infection, unspecified: Secondary | ICD-10-CM

## 2020-12-22 DIAGNOSIS — Z21 Asymptomatic human immunodeficiency virus [HIV] infection status: Secondary | ICD-10-CM

## 2020-12-22 DIAGNOSIS — B009 Herpesviral infection, unspecified: Secondary | ICD-10-CM | POA: Diagnosis not present

## 2020-12-22 DIAGNOSIS — R87811 Vaginal high risk human papillomavirus (HPV) DNA test positive: Secondary | ICD-10-CM

## 2020-12-22 DIAGNOSIS — R87620 Atypical squamous cells of undetermined significance on cytologic smear of vagina (ASC-US): Secondary | ICD-10-CM

## 2020-12-22 DIAGNOSIS — F1010 Alcohol abuse, uncomplicated: Secondary | ICD-10-CM | POA: Diagnosis not present

## 2020-12-22 DIAGNOSIS — B182 Chronic viral hepatitis C: Secondary | ICD-10-CM

## 2020-12-22 HISTORY — DX: Asymptomatic human immunodeficiency virus (hiv) infection status: Z21

## 2020-12-22 NOTE — Progress Notes (Signed)
Subjective:  Reason for Infectious Disease consult: HIV positive test  Requesting Physician: Dr. Manuella Ghazi   Patient ID: Sabrina Wyatt, female    DOB: [birth year], 70 y.o.   MRN: [id number]  HPI   Sabrina Wyatt is a 70 year old Caucasian lady who has had a prior history of a false positive HIV test in 2017.  At that point in time when she was hospitalized her fourth-generation first step was positive but discriminatory assay was negative RNA was not done at the time but then was repeated and was less than 20 on quantitative assay.  She does however have some risk factors for acquiring HIV including history of gonococcal infection in 2017.  She has a prior history of chronic hepatitis C without hepatic coma status post treat with interferon in [place] state.  He has had some atypical squamous changes of the cervix for which she follows with Dr. Glo Herring.  She has not been sexually active recently she has had 2 long-term partners the last 1 was the one who had given her gonorrhea and after he had been unfaithful to her and this led to the end of the relationship.  She is ready to start treatment for HIV but I think that she probably has another false positive test.  Would like to get an RNA test back first.  She is also interested in preexposure prophylaxis if she does not have HIV and at present I would offer her Truvada though would prefer long-term to switch her over to Apretude.  Past Medical History:  Diagnosis Date  . Abdominal wall pain    chronic  . Anemia   . Arthritis    rheumatoid  . Biliary stone   . Chronic abdominal pain   . Chronic flank pain   . Chronic low back pain   . Cirrhosis, hepatitis C    U/S on 03/09/16= no HCC, pt has been vaccinated for Hep A and Hep B. s/p treatment of HCV with eradication  . Depression   . Gall stones, common bile duct 2002  . GERD (gastroesophageal reflux disease)   . Gonorrhea   . Hepatitis     successfully treated for Hep C; eradicated  .  HIV test positive ([place]) 12/22/2020  . Hypertension   . 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 Intussusception ([place]) 04/2012  . Polyp of duodenum    Duodenal polyp  . Polysubstance abuse ([place])    HX of  . PTSD (post-traumatic stress disorder)   . Pulmonary nodules   . S/P colonoscopy 2005   Dr Moss Mc polyp removed, otherwise normal  . S/P endoscopy 08/27/10   antral erosions, otherwise normal, due egd 08/2012 to screen for varices  . Seizures ([place]) 1998   with brain bleed x1. None since then and on no meds  . Shortness of breath   . Tobacco abuse     Past Surgical History:  Procedure Laterality Date  . biliary stone removal  2015  . BIOPSY N/A 03/06/2015   Procedure: BIOPSY;  Surgeon: Daneil Dolin, MD;  Location: [facility];  Service: Endoscopy;  Laterality: N/A;  . BIOPSY  11/22/2019   Procedure: BIOPSY;  Surgeon: Daneil Dolin, MD;  [facility];  Service: Endoscopy;;  . BIOPSY  01/09/2020   Procedure: BIOPSY;  Surgeon: Irving Copas., MD;  Location: [facility];  Service: Gastroenterology;;  . Wilmon Pali RELEASE Bilateral 12/2010  . CATARACT EXTRACTION W/PHACO Right 01/15/2016   Procedure: CATARACT  EXTRACTION PHACO AND INTRAOCULAR LENS PLACEMENT RIGHT EYE CDE=5.88;  Surgeon: Tonny Branch, MD;  Location: [facility];  Service: Ophthalmology;  Laterality: Right;  . CATARACT EXTRACTION W/PHACO Left 02/12/2016   Procedure: CATARACT EXTRACTION PHACO AND INTRAOCULAR LENS PLACEMENT LEFT EYE; CDE:  6.02;  Surgeon: Tonny Branch, MD;  Location: [facility];  Service: Ophthalmology;  Laterality: Left;  . CHOLECYSTECTOMY  2002  . COLONOSCOPY    09/10/2003   diminutive polyp in the rectum cold biopsied/removed/ Normal colon  . COLONOSCOPY WITH PROPOFOL N/A 08/02/2013   JKD:TOIZTIW diverticulosis. next TCS 10 years.  . ESOPHAGOGASTRODUODENOSCOPY    09/10/2003   Small hiatal hernia; otherwise normal stomach, normal D1 and D2  . ESOPHAGOGASTRODUODENOSCOPY  08/27/2010    PYK:DXIPJA esophagus.  No varices.  Couple of tiny antral erosions of doubtful clinical significance, otherwise normal stomach, D1 and D2.  . ESOPHAGOGASTRODUODENOSCOPY (EGD) WITH PROPOFOL N/A 08/02/2013   RMR: Portal gastropathy. No explanation for abdominal pain which I believe is more abdominal wall in origin. She has been seen by Dr. Carlis Abbott  over at [facility]. She is up-to-date on cross sectional imaging. She has a pain management physician in [place]. Repeat EGD for varices in 3 years.  . ESOPHAGOGASTRODUODENOSCOPY (EGD) WITH PROPOFOL N/A 03/06/2015   SNK:NLZJQBHA gastric mucosac s/p bx  . ESOPHAGOGASTRODUODENOSCOPY (EGD) WITH PROPOFOL N/A 04/18/2017   normal esophagus, portal hypertensive gastropathy, normal duodenum, no specimens collected. Surveillance in Aug 2020  . ESOPHAGOGASTRODUODENOSCOPY (EGD) WITH PROPOFOL N/A 11/22/2019   Procedure: ESOPHAGOGASTRODUODENOSCOPY (EGD) WITH PROPOFOL;  Surgeon: Daneil Dolin, MD;  [facility];  Service: Endoscopy;  Laterality: N/A;  1:45pm-office rescheduled to 4/1 @ 1:00pm  . ESOPHAGOGASTRODUODENOSCOPY (EGD) WITH PROPOFOL N/A 01/09/2020   Procedure: ESOPHAGOGASTRODUODENOSCOPY (EGD) WITH PROPOFOL;  Surgeon: Rush Landmark Telford Nab., MD;  Location: [facility];  Service: Gastroenterology;  Laterality: N/A;  . ESOPHAGOGASTRODUODENOSCOPY (EGD) WITH PROPOFOL N/A 11/28/2020   Procedure: ESOPHAGOGASTRODUODENOSCOPY (EGD) WITH PROPOFOL;  Surgeon: Eloise Harman, DO;  [facility];  Service: Endoscopy;  Laterality: N/A;  . EUS N/A 01/09/2020   Procedure: ESOPHAGEAL ENDOSCOPIC ULTRASOUND (EUS) RADIAL;  Surgeon: Rush Landmark Telford Nab., MD;  Location: [facility];  Service: Gastroenterology;  Laterality: N/A;  . LUMBAR FUSION    . NOSE SURGERY  1970  . PERCUTANEOUS TRANSHEPATIC CHOLANGIOGRAHPY AND BILLIARY DRAINAGE  2002  . POLYPECTOMY  11/28/2020   Procedure: POLYPECTOMY;  Surgeon: Eloise Harman, DO;  [facility];  Service:  Endoscopy;;  . REPAIR VAGINAL CUFF N/A 10/25/2015   Procedure: REPAIR VAGINAL LACERATION ;  Surgeon: Jonnie Kind, MD;  Location: [facility];  Service: Gynecology;  Laterality: N/A;  . Roux-en-y-hepatojejunostomy  2003   revision in 2013 for intussusception [facility] Dr. Bailey Mech)  . SPINAL FUSION     C5-C7  . TONSILLECTOMY      Family History  Problem Relation Age of Onset  . Coronary artery disease Brother   . Cancer Sister        lymphoma  . Cancer Father        brain  . Colon cancer Neg Hx   . Pancreatic cancer Neg Hx   . Stomach cancer Neg Hx   . Esophageal cancer Neg Hx   . Inflammatory bowel disease Neg Hx   . Liver disease Neg Hx   . Rectal cancer Neg Hx       Social History   Socioeconomic History  . Marital status: Single    Spouse name: Not on file  .  Number of children: 1  . Years of education: Not on file  . Highest education level: Not on file  Occupational History  . Occupation: disabled    Fish farm manager: UNEMPLOYED  Tobacco Use  . Smoking status: Current Every Day Smoker    Packs/day: 0.25    Years: 33.00    Pack years: 8.25    Types: Cigarettes  . Smokeless tobacco: Never Used  . Tobacco comment: 1 pack every 3 days  Vaping Use  . Vaping Use: Never used  Substance and Sexual Activity  . Alcohol use: Yes    Alcohol/week: 0.0 standard drinks    Comment: As of 11/27/2020, 2 glasses of red wine per week  . Drug use: No  . Sexual activity: Yes    Partners: Male    Birth control/protection: Post-menopausal, Condom  Other Topics Concern  . Not on file  Social History Narrative  . Not on file   Social Determinants of Health   Financial Resource Strain: Not on file  Food Insecurity: Not on file  Transportation Needs: Not on file  Physical Activity: Not on file  Stress: Not on file  Social Connections: Not on file    Allergies  Allergen Reactions  . Asa [Aspirin] Nausea Only  . Bee Venom Hives  . Codeine Nausea Only  . Tylenol  [Acetaminophen] Nausea And Vomiting and Swelling    Swelling of hands and feet     Current Outpatient Medications:  .  acyclovir (ZOVIRAX) 400 MG tablet, TAKE 1 TABLET BY MOUTH TWICE DAILY. (Patient taking differently: Take 400 mg by mouth 2 (two) times daily.), Disp: 180 tablet, Rfl: 3 .  albuterol (PROVENTIL) (2.5 MG/3ML) 0.083% nebulizer solution, Take 2.5 mg by nebulization 2 (two) times daily as needed for wheezing or shortness of breath. , Disp: , Rfl:  .  ascorbic acid (VITAMIN C) 250 MG CHEW, Chew 250 mg by mouth daily., Disp: , Rfl:  .  atenolol (TENORMIN) 25 MG tablet, Take 25 mg by mouth every morning., Disp: , Rfl:  .  brexpiprazole (REXULTI) 1 MG TABS tablet, Take 1 mg by mouth daily., Disp: , Rfl:  .  BREZTRI AEROSPHERE 160-9-4.8 MCG/ACT AERO, Inhale 2 puffs into the lungs 2 (two) times daily., Disp: , Rfl:  .  Cholecalciferol (VITAMIN D) 50 MCG (2000 UT) CAPS, Take 10,000 Units by mouth daily., Disp: , Rfl:  .  DULoxetine (CYMBALTA) 60 MG capsule, Take 60 mg by mouth daily., Disp: , Rfl:  .  EPIPEN 2-PAK 0.3 MG/0.3ML SOAJ injection, Inject 0.3 mg into the skin as needed (allergic reactions). , Disp: , Rfl:  .  fluticasone (FLONASE) 50 MCG/ACT nasal spray, Place 1 spray into both nostrils 2 (two) times daily., Disp: , Rfl:  .  gabapentin (NEURONTIN) 300 MG capsule, Take 300 mg by mouth 3 (three) times daily., Disp: , Rfl:  .  loratadine (CLARITIN) 10 MG tablet, Take 10 mg by mouth daily., Disp: , Rfl:  .  nicotine (NICODERM CQ - DOSED IN MG/24 HR) 7 mg/24hr patch, Place 1 patch (7 mg total) onto the skin daily., Disp: 28 patch, Rfl: 0 .  omeprazole (PRILOSEC) 20 MG capsule, TAKE 2 CAPSULES BY MOUTH TWICE DAILY BEFORE A MEAL. (Patient taking differently: Take 40 mg by mouth 2 (two) times daily before a meal.), Disp: 60 capsule, Rfl: 1 .  ondansetron (ZOFRAN) 4 MG tablet, Take 1 tablet (4 mg total) by mouth every 6 (six) hours as needed for nausea., Disp: 20 tablet, Rfl:  0 .   oxyCODONE 7.5 MG TABS, Take 7.5 mg by mouth 4 (four) times daily., Disp: 20 tablet, Rfl: 0 .  pantoprazole (PROTONIX) 40 MG tablet, Take 1 tablet (40 mg total) by mouth 2 (two) times daily., Disp: 60 tablet, Rfl: 0 .  pravastatin (PRAVACHOL) 40 MG tablet, Take 40 mg by mouth daily., Disp: , Rfl:  .  PROAIR HFA 108 (90 BASE) MCG/ACT inhaler, Inhale 2 puffs into the lungs every 6 (six) hours as needed for wheezing or shortness of breath., Disp: , Rfl:  .  tiZANidine (ZANAFLEX) 2 MG tablet, Take 4 mg by mouth daily., Disp: , Rfl:   Review of Systems  Constitutional: Negative for activity change, appetite change, chills, diaphoresis, fatigue, fever and unexpected weight change.  HENT: Negative for congestion, rhinorrhea, sinus pressure, sneezing, sore throat and trouble swallowing.   Eyes: Negative for photophobia and visual disturbance.  Respiratory: Negative for cough, chest tightness, shortness of breath, wheezing and stridor.   Cardiovascular: Negative for chest pain, palpitations and leg swelling.  Gastrointestinal: Negative for abdominal distention, abdominal pain, anal bleeding, blood in stool, constipation, diarrhea, nausea and vomiting.  Genitourinary: Negative for difficulty urinating, dysuria, flank pain and hematuria.  Musculoskeletal: Negative for arthralgias, back pain, gait problem, joint swelling and myalgias.  Skin: Negative for color change, pallor, rash and wound.  Neurological: Negative for dizziness, tremors, weakness and light-headedness.  Hematological: Negative for adenopathy. Does not bruise/bleed easily.  Psychiatric/Behavioral: Negative for agitation, behavioral problems, confusion, decreased concentration, dysphoric mood and sleep disturbance.       Objective:   Physical Exam Constitutional:      General: She is not in acute distress.    Appearance: Normal appearance. She is well-developed. She is not ill-appearing or diaphoretic.  HENT:     Head: Normocephalic and  atraumatic.     Right Ear: Hearing and external ear normal.     Left Ear: Hearing and external ear normal.     Nose: No nasal deformity or rhinorrhea.  Eyes:     General: No scleral icterus.    Conjunctiva/sclera: Conjunctivae normal.     Right eye: Right conjunctiva is not injected.     Left eye: Left conjunctiva is not injected.     Pupils: Pupils are equal, round, and reactive to light.  Neck:     Vascular: No JVD.  Cardiovascular:     Rate and Rhythm: Normal rate and regular rhythm.     Heart sounds: Normal heart sounds, S1 normal and S2 normal. No murmur heard. No friction rub.  Abdominal:     General: Bowel sounds are normal. There is no distension.     Palpations: Abdomen is soft.     Tenderness: There is no abdominal tenderness.  Musculoskeletal:        General: Normal range of motion.     Right shoulder: Normal.     Left shoulder: Normal.     Cervical back: Normal range of motion and neck supple.     Right hip: Normal.     Left hip: Normal.     Right knee: Normal.     Left knee: Normal.  Lymphadenopathy:     Head:     Right side of head: No submandibular, preauricular or posterior auricular adenopathy.     Left side of head: No submandibular, preauricular or posterior auricular adenopathy.     Cervical: No cervical adenopathy.     Right cervical: No superficial or deep cervical adenopathy.  Left cervical: No superficial or deep cervical adenopathy.  Skin:    General: Skin is warm and dry.     Coloration: Skin is not pale.     Findings: No abrasion, bruising, ecchymosis, erythema, lesion or rash.     Nails: There is no clubbing.  Neurological:     General: No focal deficit present.     Mental Status: She is alert and oriented to person, place, and time.     Sensory: No sensory deficit.     Coordination: Coordination normal.     Gait: Gait normal.  Psychiatric:        Attention and Perception: She is attentive.        Mood and Affect: Mood normal.         Speech: Speech normal.        Behavior: Behavior normal. Behavior is cooperative.        Thought Content: Thought content normal.        Judgment: Judgment normal.           Assessment & Plan:   HIV + test: Suspect in this case is going to turn out to be a false positive test like it was in 2017, but we will check an HIV RNA today and see what that shows on quantitative analysis.  Certainly if it is positive we will start her on [place] which will have no co-pay for her.  Check her hepatitis B surface antigen and surface antibody and hepatitis A total antibody  If the test is negative we will prescribe Truvada for preexposure prophylaxis.  In either case I will see her back in 1 month's time  Chronic hepatitis C without hepatic coma: Status post treatment.  Check other viral hepatitides  Documented history of alcohol abuse: If this is still an issue which apparently was in 2021 will need to be cautious with risk of dehydration in the context of Truvada and also counseled her on the risk of hepatotoxins in someone who has a prior history of hepatitis C

## 2020-12-23 LAB — T-HELPER CELL (CD4) - (RCID CLINIC ONLY)
CD4 % Helper T Cell: 29 % — ABNORMAL LOW (ref 33–65)
CD4 T Cell Abs: 642 /uL (ref 400–1790)

## 2020-12-23 LAB — URINE CYTOLOGY ANCILLARY ONLY
Chlamydia: NEGATIVE
Comment: NEGATIVE
Comment: NORMAL
Neisseria Gonorrhea: NEGATIVE

## 2020-12-24 ENCOUNTER — Other Ambulatory Visit (HOSPITAL_COMMUNITY): Payer: Self-pay

## 2020-12-24 ENCOUNTER — Telehealth: Payer: Self-pay

## 2020-12-24 ENCOUNTER — Other Ambulatory Visit: Payer: Self-pay | Admitting: Infectious Disease

## 2020-12-24 LAB — HIV-1 RNA QUANT-NO REFLEX-BLD
HIV 1 RNA Quant: NOT DETECTED Copies/mL
HIV-1 RNA Quant, Log: NOT DETECTED Log cps/mL

## 2020-12-24 LAB — HEPATITIS A ANTIBODY, TOTAL: Hepatitis A AB,Total: REACTIVE — AB

## 2020-12-24 LAB — HEPATITIS B SURFACE ANTIBODY, QUANTITATIVE: Hep B S AB Quant (Post): 160 m[IU]/mL (ref >=10)

## 2020-12-24 LAB — RPR: RPR Ser Ql: NONREACTIVE

## 2020-12-24 LAB — HEPATITIS B SURFACE ANTIGEN: Hepatitis B Surface Ag: NONREACTIVE

## 2020-12-24 MED ORDER — EMTRICITABINE-TENOFOVIR DF 200-300 MG PO TABS
1.0000 | ORAL_TABLET | Freq: Every day | ORAL | 0 refills | Status: DC
  Filled 2020-12-24: qty 30, 30d supply, fill #0

## 2020-12-24 NOTE — Telephone Encounter (Signed)
-----   Message from Truman Hayward, MD sent at 12/24/2020  8:42 AM EDT ----- Patient had a false positive HIV test as she had in 2017 as per my discussion with her we are starting prep I have sent a Truvada prescription to [facility].  Can we let the patient know

## 2020-12-24 NOTE — Progress Notes (Signed)
I will contact Patient this am and get her script ready for her.

## 2020-12-24 NOTE — Progress Notes (Signed)
Pt has false + HIV test starting Prep

## 2020-12-24 NOTE — Telephone Encounter (Signed)
I have attempted to contact patient to relay lab results. Patient did not answer and a voicemail was left for patient to call back.  Sabrina Wyatt T Brooks Sailors

## 2020-12-26 NOTE — Telephone Encounter (Signed)
Attempted to contact to relay lab results unable to leave voicemail.

## 2020-12-29 NOTE — Telephone Encounter (Signed)
Attempted to call patient, call was disconnected. Attempted again and no answer.   Beryle Flock, RN

## 2020-12-30 NOTE — Telephone Encounter (Signed)
Left vm for patient to relay message. Requested she call office back before her upcoming appt for results.  Leatrice Jewels, RMA

## 2020-12-31 ENCOUNTER — Emergency Department (HOSPITAL_COMMUNITY): Payer: 59

## 2020-12-31 ENCOUNTER — Encounter (HOSPITAL_COMMUNITY): Payer: Self-pay | Admitting: Emergency Medicine

## 2020-12-31 ENCOUNTER — Other Ambulatory Visit: Payer: Self-pay

## 2020-12-31 ENCOUNTER — Emergency Department (HOSPITAL_COMMUNITY)
Admission: EM | Admit: 2020-12-31 | Discharge: 2020-12-31 | Disposition: A | Discharge status: Home / Self Care | Payer: 59 | Attending: Emergency Medicine | Admitting: Emergency Medicine

## 2020-12-31 DIAGNOSIS — Z5321 Procedure and treatment not carried out due to patient leaving prior to being seen by health care provider: Secondary | ICD-10-CM | POA: Diagnosis not present

## 2020-12-31 DIAGNOSIS — R0789 Other chest pain: Secondary | ICD-10-CM | POA: Insufficient documentation

## 2020-12-31 LAB — BASIC METABOLIC PANEL
Anion gap: 10 (ref 5–15)
BUN: 8 mg/dL (ref 8–23)
CO2: 23 mmol/L (ref 22–32)
Calcium: 8.5 mg/dL — ABNORMAL LOW (ref 8.9–10.3)
Chloride: 106 mmol/L (ref 98–111)
Creatinine, Ser: 0.63 mg/dL (ref 0.44–1.00)
GFR, Estimated: >60 mL/min (ref >60)
Glucose, Bld: 91 mg/dL (ref 70–99)
Potassium: 4 mmol/L (ref 3.5–5.1)
Sodium: 139 mmol/L (ref 135–145)

## 2020-12-31 LAB — CBC
HCT: 40 % (ref 36.0–46.0)
Hemoglobin: 12.4 g/dL (ref 12.0–15.0)
MCH: 27.9 pg (ref 26.0–34.0)
MCHC: 31 g/dL (ref 30.0–36.0)
MCV: 90.1 fL (ref 80.0–100.0)
Platelets: 166 10*3/uL (ref 150–400)
RBC: 4.44 MIL/uL (ref 3.87–5.11)
RDW: 15.6 % — ABNORMAL HIGH (ref 11.5–15.5)
WBC: 6.8 10*3/uL (ref 4.0–10.5)
nRBC: 0 % (ref 0.0–0.2)

## 2020-12-31 LAB — TROPONIN I (HIGH SENSITIVITY): Troponin I (High Sensitivity): 3 ng/L (ref <18)

## 2020-12-31 NOTE — ED Notes (Signed)
Pt wants to be disconnected from monitor, IV taken out and pt ambulating out of the dept with steady gait.  Pt states she is tired and wants to go home.  Pt states she will follow up with her doctor tomorrow.

## 2020-12-31 NOTE — ED Triage Notes (Signed)
Pt c/o left sided chest pressure that started this afternoon after she woke up. Pain is reproducible. Pt denies any cardiac hx.

## 2021-01-02 ENCOUNTER — Other Ambulatory Visit: Payer: Self-pay | Admitting: Gastroenterology

## 2021-01-26 ENCOUNTER — Ambulatory Visit: Payer: 59 | Admitting: Infectious Disease

## 2021-01-28 ENCOUNTER — Encounter: Payer: Self-pay | Admitting: Infectious Disease

## 2021-02-02 ENCOUNTER — Other Ambulatory Visit: Payer: Self-pay

## 2021-02-02 ENCOUNTER — Telehealth: Payer: Self-pay

## 2021-02-02 ENCOUNTER — Ambulatory Visit: Payer: 59 | Admitting: Infectious Disease

## 2021-02-02 MED ORDER — EMTRICITABINE-TENOFOVIR DF 200-300 MG PO TABS: 1.0000 | ORAL_TABLET | Freq: Every day | ORAL | 0 refills | Status: DC

## 2021-02-02 NOTE — Telephone Encounter (Signed)
Patient called stating she needed to reschedule her appointment with Dr. Tommy Medal today due to transportation. Patient has been rescheduled to 01/28/21.  Patient also stated she did not start the Truvada and was unaware it had been sent in to the pharmacy. Patient advised we tried to reach her several times in May to let her know. Patient requested the Truvada be resent to [facility]. Rx resent and she will pick up and start.  Sabrina Wyatt T Brooks Sailors

## 2021-02-03 ENCOUNTER — Other Ambulatory Visit: Payer: Self-pay | Admitting: Gastroenterology

## 2021-02-03 NOTE — Telephone Encounter (Signed)
Patient needs to contact office. Conflict with another PPI listed

## 2021-02-06 ENCOUNTER — Other Ambulatory Visit: Payer: Self-pay | Admitting: Gastroenterology

## 2021-02-13 ENCOUNTER — Other Ambulatory Visit (HOSPITAL_COMMUNITY): Payer: Self-pay

## 2021-02-16 ENCOUNTER — Other Ambulatory Visit: Payer: Self-pay

## 2021-02-16 ENCOUNTER — Telehealth: Payer: Self-pay | Admitting: *Deleted

## 2021-02-16 ENCOUNTER — Telehealth (INDEPENDENT_AMBULATORY_CARE_PROVIDER_SITE_OTHER): Payer: 59 | Admitting: Gastroenterology

## 2021-02-16 DIAGNOSIS — K7460 Unspecified cirrhosis of liver: Secondary | ICD-10-CM

## 2021-02-16 NOTE — Progress Notes (Signed)
Tried to call pt for virtual visit.  Number not in service.  Called alternate number of friend on file and left a message for pt to call us back.  She informed us that pt's phone is not working.

## 2021-02-16 NOTE — Telephone Encounter (Signed)
Pt consented to a virtual visit. 

## 2021-02-16 NOTE — Telephone Encounter (Signed)
Sabrina Wyatt, you are scheduled for a virtual visit with your provider today.  Just as we do with appointments in the office, we must obtain your consent to participate.  Your consent will be active for this visit and any virtual visit you may have with one of our providers in the next 365 days.  If you have a MyChart account, I can also send a copy of this consent to you electronically.  All virtual visits are billed to your insurance company just like a traditional visit in the office.  As this is a virtual visit, video technology does not allow for your provider to perform a traditional examination.  This may limit your provider's ability to fully assess your condition.  If your provider identifies any concerns that need to be evaluated in person or the need to arrange testing such as labs, EKG, etc, we will make arrangements to do so.  Although advances in technology are sophisticated, we cannot ensure that it will always work on either your end or our end.  If the connection with a video visit is poor, we may have to switch to a telephone visit.  With either a video or telephone visit, we are not always able to ensure that we have a secure connection.   I need to obtain your verbal consent now.   Are you willing to proceed with your visit today?

## 2021-02-16 NOTE — Addendum Note (Signed)
Addended by: Mahala Menghini on: 02/16/2021 08:09 AM   Modules accepted: Level of Service

## 2021-02-16 NOTE — Progress Notes (Signed)
Please offer to reschedule patient. She needs hospital follow up to schedule colonoscopy and follow up cirrhosis.

## 2021-02-16 NOTE — Progress Notes (Signed)
Sabrina Wyatt:  Can you arrange ov for pt and mail letter please?  She missed ov today.

## 2021-02-19 ENCOUNTER — Other Ambulatory Visit (HOSPITAL_COMMUNITY): Payer: Self-pay

## 2021-02-20 NOTE — Progress Notes (Signed)
Southard, Stacey W  Karlissa Aron M, RMA We had left her several messages about this appointment, the best would be forher to call and set one up instead of mailing her a letter

## 2021-02-24 ENCOUNTER — Encounter: Payer: Self-pay | Admitting: Internal Medicine

## 2021-02-24 NOTE — Progress Notes (Signed)
Sabrina Wyatt sent letter to pt.

## 2021-02-27 ENCOUNTER — Encounter: Payer: Self-pay | Admitting: Infectious Disease

## 2021-02-27 ENCOUNTER — Ambulatory Visit (INDEPENDENT_AMBULATORY_CARE_PROVIDER_SITE_OTHER): Payer: 59 | Admitting: Infectious Disease

## 2021-02-27 ENCOUNTER — Other Ambulatory Visit: Payer: Self-pay

## 2021-02-27 VITALS — BP 114/76 | HR 105 | Temp 99.0°F | Wt 172.0 lb

## 2021-02-27 DIAGNOSIS — F1111 Opioid abuse, in remission: Secondary | ICD-10-CM

## 2021-02-27 DIAGNOSIS — F1011 Alcohol abuse, in remission: Secondary | ICD-10-CM

## 2021-02-27 DIAGNOSIS — A549 Gonococcal infection, unspecified: Secondary | ICD-10-CM | POA: Diagnosis not present

## 2021-02-27 DIAGNOSIS — R87620 Atypical squamous cells of undetermined significance on cytologic smear of vagina (ASC-US): Secondary | ICD-10-CM

## 2021-02-27 DIAGNOSIS — Z21 Asymptomatic human immunodeficiency virus [HIV] infection status: Secondary | ICD-10-CM

## 2021-02-27 DIAGNOSIS — R87811 Vaginal high risk human papillomavirus (HPV) DNA test positive: Secondary | ICD-10-CM

## 2021-02-27 DIAGNOSIS — Z79899 Other long term (current) drug therapy: Secondary | ICD-10-CM | POA: Diagnosis not present

## 2021-02-27 DIAGNOSIS — B182 Chronic viral hepatitis C: Secondary | ICD-10-CM

## 2021-02-27 HISTORY — DX: Alcohol abuse, in remission: F10.11

## 2021-02-27 HISTORY — DX: Opioid abuse, in remission: F11.11

## 2021-02-27 MED ORDER — EMTRICITABINE-TENOFOVIR DF 200-300 MG PO TABS: 1.0000 | ORAL_TABLET | Freq: Every day | ORAL | 2 refills | Status: AC

## 2021-02-27 NOTE — Progress Notes (Signed)
Subjective:  Chief complaint: Follow-up for preexposure prophylaxis for HIV   Patient ID: Sabrina Wyatt, female    DOB: November 25, 1950, 70 y.o.   MRN: [id number]  HPI   Sabrina Wyatt is a 70 year old Caucasian lady who has had a prior history of a false positive HIV test in 2017.  At that point in time when she was hospitalized her fourth-generation first step was positive but discriminatory assay was negative RNA was not done at the time but then was repeated and was less than 20 on quantitative assay.  She does however have some risk factors for acquiring HIV including history of gonococcal infection in 2017.  She has a prior history of chronic hepatitis C without hepatic coma status post treat with interferon in [place] state.  He has had some atypical squamous changes of the cervix for which she follows with Dr. Glo Herring.  She has not been sexually active recently she has had 2 long-term partners the last 1 was the one who had given her gonorrhea and after he had been unfaithful to her and this led to the end of the relationship.  She was  ready to start treatment for HIV but she in fact had a false positive HIV test again.   She was interested in preexposure prophylaxis and we initiated Truvada when her test came back negative.  She is here for follow-up she has not had any new sexual partners.  I think a more optimal drug for her would be Apretude given that she is now 70 years old and certainly may already have osteoporosis and I would not want to further exacerbate that with tenofovir disoproxil fumarate.  I also am not interested in renal risk at her age of this agent either.    Past Medical History:  Diagnosis Date   Abdominal wall pain    chronic   Anemia    Arthritis    rheumatoid   Biliary stone    Chronic abdominal pain    Chronic flank pain    Chronic low back pain    Cirrhosis, hepatitis C    U/S on 03/09/16= no HCC, pt has been vaccinated for Hep A and Hep B. 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 None since then and on no meds   Shortness of breath    Tobacco abuse     Past Surgical History:  Procedure Laterality Date   biliary stone removal  2015   BIOPSY N/A 03/06/2015   Procedure: BIOPSY;  Surgeon: Daneil Dolin, MD;  Location: [facility];  Service: Endoscopy;  Laterality: N/A;   BIOPSY  11/22/2019   Procedure: BIOPSY;  Surgeon: Daneil Dolin, MD;  [facility];  Service: Endoscopy;;   BIOPSY  01/09/2020   Procedure: BIOPSY;  Surgeon: Irving Copas., MD;  Location: [facility];  Service: Gastroenterology;;   CARPAL TUNNEL RELEASE Bilateral 12/2010   CATARACT EXTRACTION W/PHACO Right 01/15/2016   Procedure: CATARACT EXTRACTION PHACO AND INTRAOCULAR LENS PLACEMENT RIGHT EYE CDE=5.88;  Surgeon: Tonny Branch, MD;  Location: [facility];  Service: Ophthalmology;  Laterality: Right;   CATARACT EXTRACTION W/PHACO Left 02/12/2016   Procedure: CATARACT EXTRACTION PHACO AND INTRAOCULAR LENS PLACEMENT LEFT EYE; CDE:  6.02;  Surgeon: Tonny Branch, MD;  Location: [facility];  Service: Ophthalmology;  Laterality: Left;   CHOLECYSTECTOMY  2002   COLONOSCOPY    09/10/2003   diminutive polyp in the rectum cold biopsied/removed/ Normal colon   COLONOSCOPY WITH PROPOFOL N/A 08/02/2013   ZJQ:BHALPFX diverticulosis. next  TCS 10 years.   ESOPHAGOGASTRODUODENOSCOPY    09/10/2003   Small hiatal hernia; otherwise normal stomach, normal D1 and D2   ESOPHAGOGASTRODUODENOSCOPY  08/27/2010   TKW:IOXBDZ esophagus.  No varices.  Couple of tiny antral erosions of doubtful clinical significance, otherwise normal stomach, D1 and D2.   ESOPHAGOGASTRODUODENOSCOPY (EGD) WITH PROPOFOL N/A 08/02/2013   RMR: Portal gastropathy. No explanation for abdominal pain which I believe is more abdominal wall in origin. She has been seen by Dr. Carlis Abbott  over at [facility]. She is up-to-date on cross sectional imaging. She has a pain management physician in [place]. Repeat EGD for varices in 3 years.   ESOPHAGOGASTRODUODENOSCOPY (EGD) WITH PROPOFOL N/A 03/06/2015   HGD:JMEQASTM gastric mucosac s/p bx   ESOPHAGOGASTRODUODENOSCOPY (EGD) WITH PROPOFOL N/A 04/18/2017   normal esophagus, portal hypertensive gastropathy, normal duodenum, no specimens collected. Surveillance in Aug 2020   ESOPHAGOGASTRODUODENOSCOPY (EGD) WITH PROPOFOL N/A 11/22/2019   Procedure: ESOPHAGOGASTRODUODENOSCOPY (EGD) WITH PROPOFOL;  Surgeon: Daneil Dolin, MD;  [facility];  Service: Endoscopy;  Laterality: N/A;  1:45pm-office rescheduled to 4/1 @ 1:00pm   ESOPHAGOGASTRODUODENOSCOPY (EGD) WITH PROPOFOL N/A 01/09/2020   Procedure: ESOPHAGOGASTRODUODENOSCOPY (EGD) WITH PROPOFOL;  Surgeon: Rush Landmark Telford Nab., MD;  Location: [facility];  Service: Gastroenterology;  Laterality: N/A;   ESOPHAGOGASTRODUODENOSCOPY (EGD) WITH PROPOFOL N/A 11/28/2020   Procedure: ESOPHAGOGASTRODUODENOSCOPY (EGD) WITH PROPOFOL;  Surgeon: Eloise Harman, DO;  [facility];  Service: Endoscopy;  Laterality: N/A;   EUS N/A 01/09/2020   Procedure: ESOPHAGEAL ENDOSCOPIC ULTRASOUND (EUS) RADIAL;  Surgeon: Rush Landmark Telford Nab., MD;  Location: [facility];  Service: Gastroenterology;  Laterality: N/A;   LUMBAR FUSION     NOSE SURGERY  1970   PERCUTANEOUS TRANSHEPATIC  CHOLANGIOGRAHPY AND BILLIARY DRAINAGE  2002   POLYPECTOMY  11/28/2020   Procedure: POLYPECTOMY;  Surgeon: Eloise Harman, DO;  [facility];  Service: Endoscopy;;   REPAIR VAGINAL CUFF N/A 10/25/2015   Procedure: REPAIR VAGINAL LACERATION ;  Surgeon: Jonnie Kind, MD;  Location: [facility];  Service: Gynecology;  Laterality: N/A;   Roux-en-y-hepatojejunostomy  2003   revision in 2013 for intussusception [facility] Dr. Bailey Mech)   SPINAL FUSION     C5-C7   TONSILLECTOMY      Family History  Problem Relation Age of Onset   Coronary artery disease Brother    Cancer Sister        lymphoma   Cancer Father        brain   Colon cancer Neg Hx    Pancreatic cancer Neg Hx    Stomach cancer Neg Hx    Esophageal cancer Neg Hx    Inflammatory bowel disease  Neg Hx    Liver disease Neg Hx    Rectal cancer Neg Hx       Social History   Socioeconomic History   Marital status: Single    Spouse name: Not on file   Number of children: 1   Years of education: Not on file   Highest education level: Not on file  Occupational History   Occupation: disabled    Employer: UNEMPLOYED  Tobacco Use   Smoking status: Every Day    Packs/day: 0.25    Years: 33.00    Pack years: 8.25    Types: Cigarettes   Smokeless tobacco: Never   Tobacco comments:    1 pack every 3 days  Vaping Use   Vaping Use: Never used  Substance and Sexual Activity   Alcohol use: Yes    Alcohol/week: 0.0 standard drinks    Comment: As of 11/27/2020, 2 glasses of red wine per week   Drug use: No   Sexual activity: Not Currently    Partners: Male    Birth control/protection: Post-menopausal, Condom    Comment: accepted condoms.  Other Topics Concern   Not on file  Social History Narrative   Not on file   Social Determinants of Health   Financial Resource Strain: Not on file  Food Insecurity: Not on file  Transportation Needs: Not on file  Physical Activity: Not on file  Stress: Not on file  Social  Connections: Not on file    Allergies  Allergen Reactions   Asa [Aspirin] Nausea Only   Bee Venom Hives   Codeine Nausea Only   Tylenol [Acetaminophen] Nausea And Vomiting and Swelling    Swelling of hands and feet     Current Outpatient Medications:    acyclovir (ZOVIRAX) 400 MG tablet, TAKE 1 TABLET BY MOUTH TWICE DAILY. (Patient taking differently: Take 400 mg by mouth 2 (two) times daily.), Disp: 180 tablet, Rfl: 3   albuterol (PROVENTIL) (2.5 MG/3ML) 0.083% nebulizer solution, Take 2.5 mg by nebulization 2 (two) times daily as needed for wheezing or shortness of breath. , Disp: , Rfl:    ascorbic acid (VITAMIN C) 250 MG CHEW, Chew 250 mg by mouth daily., Disp: , Rfl:    atenolol (TENORMIN) 25 MG tablet, Take 25 mg by mouth every morning., Disp: , Rfl:    BREZTRI AEROSPHERE 160-9-4.8 MCG/ACT AERO, Inhale 2 puffs into the lungs 2 (two) times daily., Disp: , Rfl:    Cholecalciferol (VITAMIN D) 50 MCG (2000 UT) CAPS, Take 10,000 Units by mouth daily., Disp: , Rfl:    DULoxetine (CYMBALTA) 60 MG capsule, Take 60 mg by mouth daily., Disp: , Rfl:    emtricitabine-tenofovir (TRUVADA) 200-300 MG tablet, Take 1 tablet by mouth daily., Disp: 30 tablet, Rfl: 0   EPIPEN 2-PAK 0.3 MG/0.3ML SOAJ injection, Inject 0.3 mg into the skin as needed (allergic reactions). , Disp: , Rfl:    etodolac (LODINE) 400 MG tablet, Take 400 mg by mouth daily., Disp: , Rfl:    fluticasone (FLONASE) 50 MCG/ACT nasal spray, Place 1 spray into both nostrils 2 (two) times daily., Disp: , Rfl:    gabapentin (NEURONTIN) 300 MG capsule, Take 300 mg by mouth 3 (three) times daily., Disp: , Rfl:    loratadine (CLARITIN) 10 MG tablet, Take 10 mg by mouth daily., Disp: , Rfl:    nicotine (NICODERM CQ - DOSED IN MG/24 HR) 7 mg/24hr patch, Place 1 patch (7 mg total) onto the skin daily., Disp: 28  patch, Rfl: 0   omeprazole (PRILOSEC) 20 MG capsule, TAKE 2 CAPSULES BY MOUTH TWICE DAILY BEFORE A MEAL. (Patient taking differently:  Take 40 mg by mouth 2 (two) times daily before a meal.), Disp: 60 capsule, Rfl: 1   ondansetron (ZOFRAN) 4 MG tablet, Take 1 tablet (4 mg total) by mouth every 6 (six) hours as needed for nausea., Disp: 20 tablet, Rfl: 0   oxyCODONE (OXY IR/ROXICODONE) 5 MG immediate release tablet, Take 5 mg by mouth 4 (four) times daily., Disp: , Rfl:    pravastatin (PRAVACHOL) 40 MG tablet, Take 40 mg by mouth daily., Disp: , Rfl:    PROAIR HFA 108 (90 BASE) MCG/ACT inhaler, Inhale 2 puffs into the lungs every 6 (six) hours as needed for wheezing or shortness of breath., Disp: , Rfl:    tiZANidine (ZANAFLEX) 2 MG tablet, Take 4 mg by mouth daily., Disp: , Rfl:    brexpiprazole (REXULTI) 1 MG TABS tablet, Take 1 mg by mouth daily. (Patient not taking: Reported on 02/27/2021), Disp: , Rfl:    naproxen (NAPROSYN) 500 MG tablet, Take 500 mg by mouth 2 (two) times daily., Disp: , Rfl:    oxyCODONE 7.5 MG TABS, Take 7.5 mg by mouth 4 (four) times daily. (Patient not taking: Reported on 02/27/2021), Disp: 20 tablet, Rfl: 0   pantoprazole (PROTONIX) 40 MG tablet, Take 1 tablet (40 mg total) by mouth 2 (two) times daily., Disp: 60 tablet, Rfl: 0  Review of Systems  Constitutional:  Negative for activity change, appetite change, chills, diaphoresis, fatigue, fever and unexpected weight change.  HENT:  Negative for congestion, rhinorrhea, sinus pressure, sneezing, sore throat and trouble swallowing.   Eyes:  Negative for photophobia and visual disturbance.  Respiratory:  Negative for cough, chest tightness, shortness of breath, wheezing and stridor.   Cardiovascular:  Negative for chest pain, palpitations and leg swelling.  Gastrointestinal:  Negative for abdominal distention, abdominal pain, anal bleeding, blood in stool, constipation, diarrhea, nausea and vomiting.  Genitourinary:  Negative for difficulty urinating, dysuria, flank pain and hematuria.  Musculoskeletal:  Negative for arthralgias, back pain, gait problem,  joint swelling and myalgias.  Skin:  Negative for color change, pallor, rash and wound.  Neurological:  Negative for dizziness, tremors, speech difficulty, weakness and light-headedness.  Hematological:  Negative for adenopathy. Does not bruise/bleed easily.  Psychiatric/Behavioral:  Negative for agitation, behavioral problems, confusion, decreased concentration, dysphoric mood and sleep disturbance.       Objective:   Physical Exam Constitutional:      General: She is not in acute distress.    Appearance: Normal appearance. She is well-developed. She is not ill-appearing or diaphoretic.  HENT:     Head: Normocephalic and atraumatic.     Right Ear: Hearing and external ear normal.     Left Ear: Hearing and external ear normal.     Nose: No nasal deformity or rhinorrhea.  Eyes:     General: No scleral icterus.    Conjunctiva/sclera: Conjunctivae normal.     Right eye: Right conjunctiva is not injected.     Left eye: Left conjunctiva is not injected.     Pupils: Pupils are equal, round, and reactive to light.  Neck:     Vascular: No JVD.  Cardiovascular:     Rate and Rhythm: Normal rate and regular rhythm.     Heart sounds: Normal heart sounds, S1 normal and S2 normal. No murmur heard.   No friction rub.  Abdominal:  General: Bowel sounds are normal. There is no distension.     Palpations: Abdomen is soft.     Tenderness: There is no abdominal tenderness.  Musculoskeletal:        General: Normal range of motion.     Right shoulder: Normal.     Left shoulder: Normal.     Cervical back: Normal range of motion and neck supple.     Right hip: Normal.     Left hip: Normal.     Right knee: Normal.     Left knee: Normal.  Lymphadenopathy:     Head:     Right side of head: No submandibular, preauricular or posterior auricular adenopathy.     Left side of head: No submandibular, preauricular or posterior auricular adenopathy.     Cervical: No cervical adenopathy.     Right  cervical: No superficial or deep cervical adenopathy.    Left cervical: No superficial or deep cervical adenopathy.  Skin:    General: Skin is warm and dry.     Coloration: Skin is not jaundiced or pale.     Findings: No abrasion, bruising, ecchymosis, erythema, lesion or rash.     Nails: There is no clubbing.  Neurological:     General: No focal deficit present.     Mental Status: She is alert and oriented to person, place, and time.     Sensory: No sensory deficit.     Coordination: Coordination normal.     Gait: Gait normal.  Psychiatric:        Attention and Perception: She is attentive.        Mood and Affect: Mood normal.        Speech: Speech normal.        Behavior: Behavior normal. Behavior is cooperative.        Thought Content: Thought content normal.        Judgment: Judgment normal.          Assessment & Plan:   HIV PrEP:  I did a rapid HIV test today in clinic because I wanted to see if we could use this test in the context of giving her Apretude  Fortunately the test came back negative which would allow [place] to use this in the context of prescribing Apretude  I have also sent HIV RNA testing and will renew her Truvada in the interim.  However I am reaching out to Cassie Kuppelweiser to try to get her changed over to Apretude  This product has been proven to be superior to oral prep, specifically Truvada and especially so in women.  It is also a safer agent to give to her at this age.    Chronic hepatitis C without hepatic coma: Status post treatment.  Check other viral hepatitides.  She says she acquired this through heroin injection when she was a teenager  Documented history of alcohol abuse: She admitted to having past problems with alcohol abuse but says that now she only drinks 1 beer per day after having gone through treatment.  I spent more than 40 minutes with the patient including  face to face counseling of the patient personally reviewing  radiographs, along with pertinent laboratory microbiological, virological data, review of medical records in preparation for the visit and during the visit and in coordination of her care.

## 2021-03-02 ENCOUNTER — Other Ambulatory Visit: Payer: Self-pay

## 2021-03-02 ENCOUNTER — Telehealth: Payer: Self-pay

## 2021-03-02 ENCOUNTER — Telehealth (INDEPENDENT_AMBULATORY_CARE_PROVIDER_SITE_OTHER): Payer: 59 | Admitting: Gastroenterology

## 2021-03-02 DIAGNOSIS — K7460 Unspecified cirrhosis of liver: Secondary | ICD-10-CM

## 2021-03-02 LAB — COMPLETE METABOLIC PANEL WITH GFR
AG Ratio: 1.4 (calc) (ref 1.0–2.5)
ALT: 17 U/L (ref 6–29)
AST: 30 U/L (ref 10–35)
Albumin: 3.8 g/dL (ref 3.6–5.1)
Alkaline phosphatase (APISO): 110 U/L (ref 37–153)
BUN: 9 mg/dL (ref 7–25)
CO2: 20 mmol/L (ref 20–32)
Calcium: 8.8 mg/dL (ref 8.6–10.4)
Chloride: 109 mmol/L (ref 98–110)
Creat: 0.81 mg/dL (ref 0.50–0.99)
GFR, Est African American: 86 mL/min/{1.73_m2} (ref >=60)
GFR, Est Non African American: 74 mL/min/{1.73_m2} (ref >=60)
Globulin: 2.7 g/dL (calc) (ref 1.9–3.7)
Glucose, Bld: 94 mg/dL (ref 65–99)
Potassium: 4 mmol/L (ref 3.5–5.3)
Sodium: 140 mmol/L (ref 135–146)
Total Bilirubin: 0.4 mg/dL (ref 0.2–1.2)
Total Protein: 6.5 g/dL (ref 6.1–8.1)

## 2021-03-02 LAB — CBC WITH DIFFERENTIAL/PLATELET
Absolute Monocytes: 519 cells/uL (ref 200–950)
Basophils Absolute: 40 cells/uL (ref 0–200)
Basophils Relative: 0.7 %
Eosinophils Absolute: 182 cells/uL (ref 15–500)
Eosinophils Relative: 3.2 %
HCT: 39 % (ref 35.0–45.0)
Hemoglobin: 12.8 g/dL (ref 11.7–15.5)
Lymphs Abs: 1664 cells/uL (ref 850–3900)
MCH: 28.4 pg (ref 27.0–33.0)
MCHC: 32.8 g/dL (ref 32.0–36.0)
MCV: 86.5 fL (ref 80.0–100.0)
MPV: 11.8 fL (ref 7.5–12.5)
Monocytes Relative: 9.1 %
Neutro Abs: 3295 cells/uL (ref 1500–7800)
Neutrophils Relative %: 57.8 %
Platelets: 135 10*3/uL — ABNORMAL LOW (ref 140–400)
RBC: 4.51 10*6/uL (ref 3.80–5.10)
RDW: 14.7 % (ref 11.0–15.0)
Total Lymphocyte: 29.2 %
WBC: 5.7 10*3/uL (ref 3.8–10.8)

## 2021-03-02 LAB — HIV-1 RNA QUANT-NO REFLEX-BLD
HIV 1 RNA Quant: NOT DETECTED Copies/mL
HIV-1 RNA Quant, Log: NOT DETECTED Log cps/mL

## 2021-03-02 LAB — C. TRACHOMATIS/N. GONORRHOEAE RNA
C. trachomatis RNA, TMA: NOT DETECTED
N. gonorrhoeae RNA, TMA: NOT DETECTED

## 2021-03-02 LAB — RPR: RPR Ser Ql: NONREACTIVE

## 2021-03-02 NOTE — Telephone Encounter (Signed)
She was returning [name]'s call for virtual visit after 8:10am.

## 2021-03-02 NOTE — Telephone Encounter (Signed)
Noted  

## 2021-03-02 NOTE — Progress Notes (Signed)
Patient no showed

## 2021-03-02 NOTE — Telephone Encounter (Signed)
Just to clarify, was patient calling to start visit after 8:10 or calling in to speak to nurse with issues. Just need to make sure if patient anticipates return call, that CMA/LPN returns call.

## 2021-03-02 NOTE — Telephone Encounter (Signed)
Pt was virtual visit at 8:00am this morning. [name] called pt at 7:59am to start visit, however, she had to leave voicemail and advised pt to return call prior to 8:10am. Pt called office and LMOVM after 8:10am requesting to speak to Neil Crouch PA.  Erline Levine, please reschedule OV.

## 2021-03-03 NOTE — Telephone Encounter (Signed)
noted 

## 2021-03-04 ENCOUNTER — Telehealth: Payer: Self-pay | Admitting: Internal Medicine

## 2021-03-04 NOTE — Telephone Encounter (Signed)
PLEASE CALL PATIENT, SHE NEEDS A LETTER FOR HER LAWYER THAT STATES HER CONDITION.

## 2021-03-05 NOTE — Telephone Encounter (Signed)
Lmom for pt to call me back. 

## 2021-03-06 NOTE — Telephone Encounter (Signed)
Spoke to pt.  She is requesting a letter stating the status of her health and diagnosis.  She said that she needs it for her probation officer in order to avoid serving jail time.  She said the letter is only for court purposes.  Pt aware that I have to discuss further with provider here.  She voiced understanding.  Dr. Gala Romney:  Is it ok for me to provide pt with one?

## 2021-03-06 NOTE — Telephone Encounter (Signed)
Noted  

## 2021-03-10 ENCOUNTER — Encounter: Payer: Self-pay | Admitting: *Deleted

## 2021-03-10 NOTE — Telephone Encounter (Signed)
Lmom for pt to call me back.  Letter done and ready for pick up.

## 2021-03-11 ENCOUNTER — Telehealth: Payer: Self-pay | Admitting: Internal Medicine

## 2021-03-11 NOTE — Telephone Encounter (Signed)
Pt would last ov note for court.  Routing to [employer].

## 2021-03-11 NOTE — Telephone Encounter (Signed)
[phone]  PATIENT RETURNED CALL, PLEASE CALL BACK

## 2021-03-12 NOTE — Telephone Encounter (Signed)
Office note and letter are up front in sample drawer.

## 2021-04-04 IMAGING — DX THORACIC SPINE - 3 VIEWS
3 series · 3 of 3 positions shown · non-contrast
Comparison: CT 02/16/2019.  T-spine 06/12/2018.

CLINICAL DATA: Back pain.

EXAM:
THORACIC SPINE - 3 VIEWS

[t-spine ap]
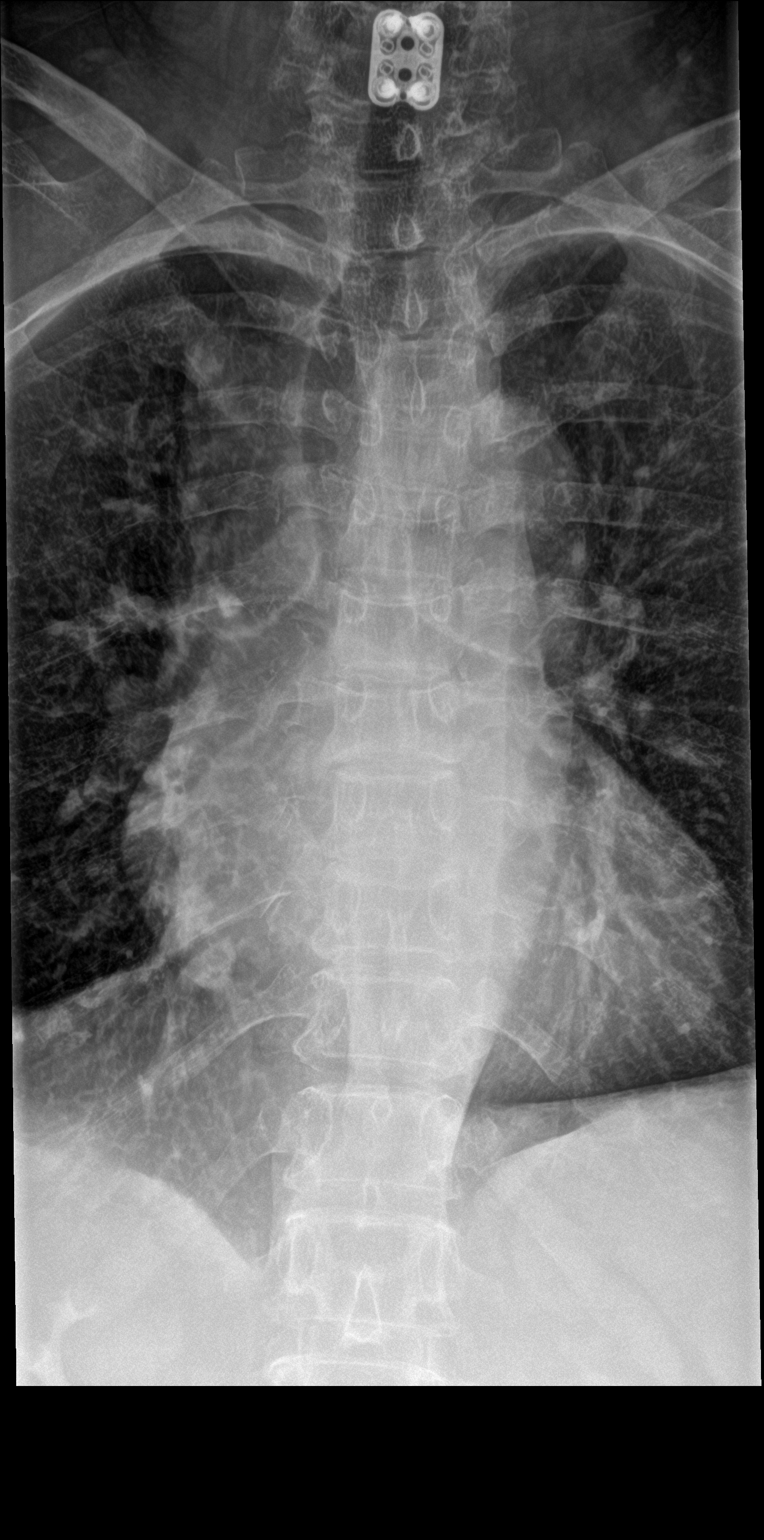

[t-spine lat]
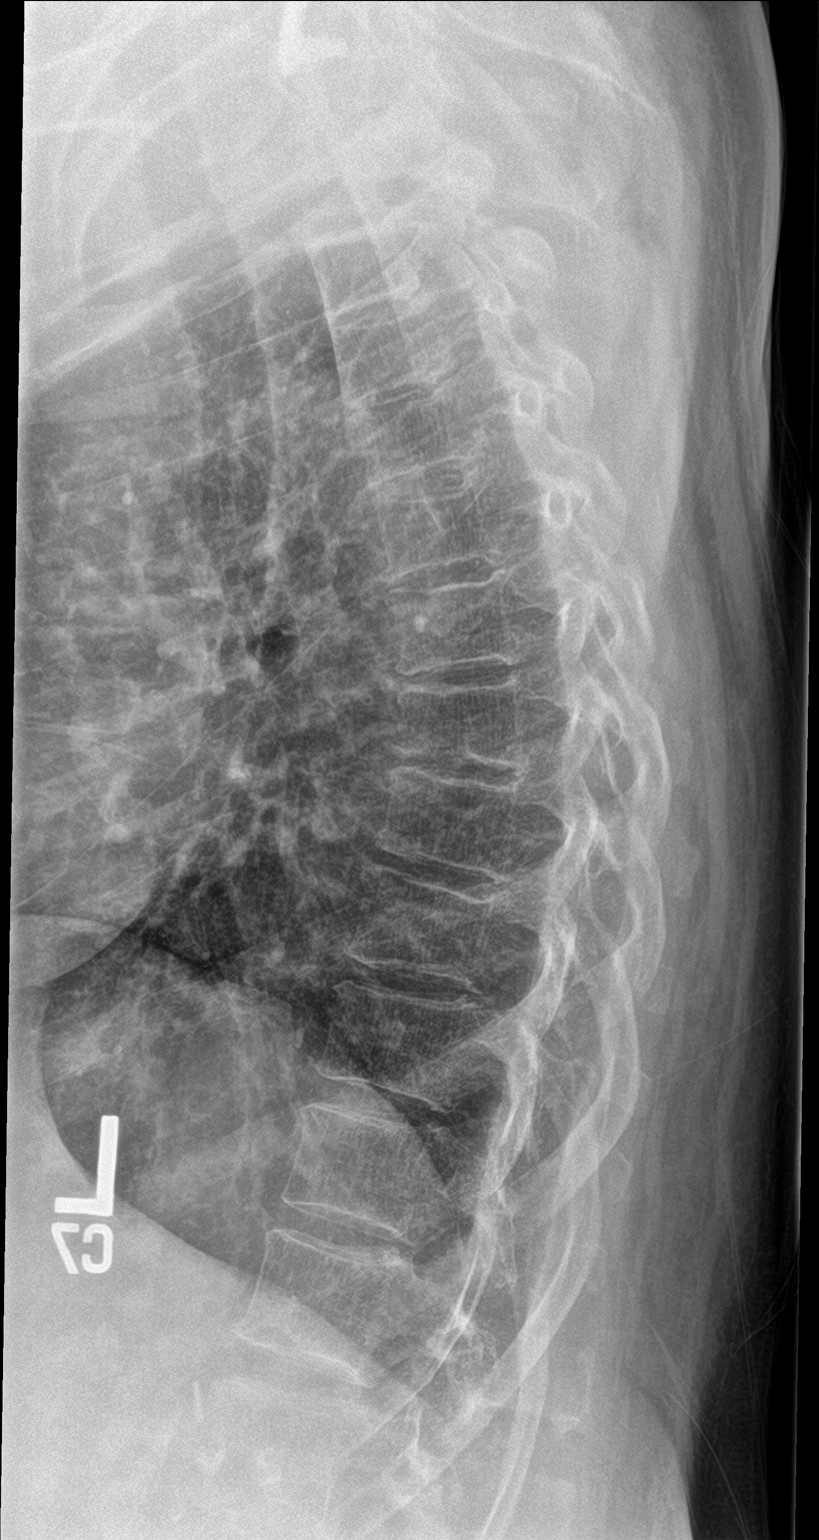

[t-spine swimmers]
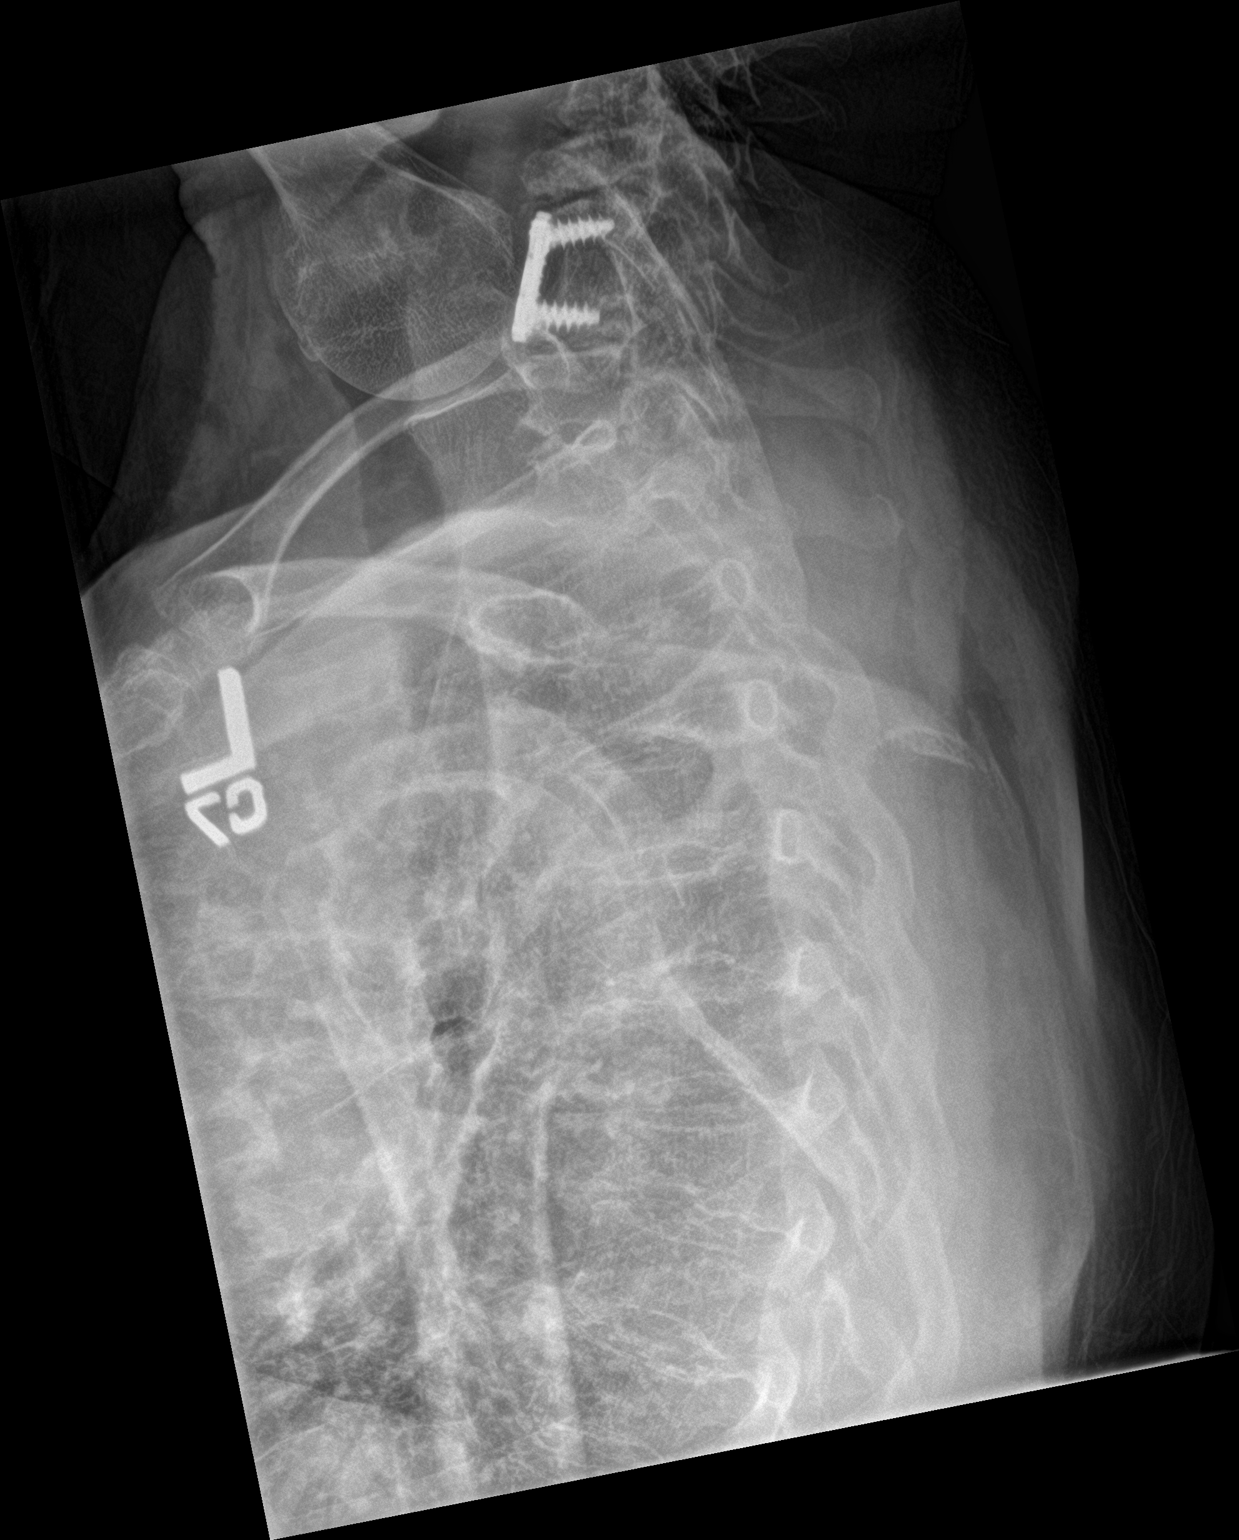

[3 of 3 positions shown; findings below may reference images not displayed]

FINDINGS: Diffuse osteopenia degenerative change. Stable mild midthoracic
vertebral body compression fracture. Prior cervical spine fusion.
Spine fusion.
IMPRESSION: 1. Diffuse osteopenia degenerative change. Stable midthoracic
vertebral body compression fracture. Prior cervical spine fusion.

2.  No acute abnormality.

## 2021-04-04 IMAGING — CT CT ABDOMEN AND PELVIS WITH CONTRAST
2 of 5 series · 16 of 46 positions shown, 18 images · IV contrast (Isovue)
Comparison: CT 10/28/2017

CLINICAL DATA: Pt had a gb surgery in 2117 with complications
involving intestines being looped and attached for correction, hx
sbo with surg. Pt c/o constant lower abd pain, denies n,v,d.
^100mL OMNIPAQUE IOHEXOL 300 MG/ML SOLNAbd pain, acute,
generalized history of cirrhosis, diverticular disease; abdominal
pain and urinary frequency

EXAM:
CT ABDOMEN AND PELVIS WITH CONTRAST
TECHNIQUE: Multidetector CT imaging of the abdomen and pelvis was performed
using the standard protocol following bolus administration of
intravenous contrast.
CONTRAST:  100mL OMNIPAQUE IOHEXOL 300 MG/ML  SOLN

[Series 2: axial (person_name) (person_name) · axial · 0.73mm/px · z∈[+936,+1286]mm · 13 of 81 slices shown, 15 images]
[im 6/81  soft-tissue]
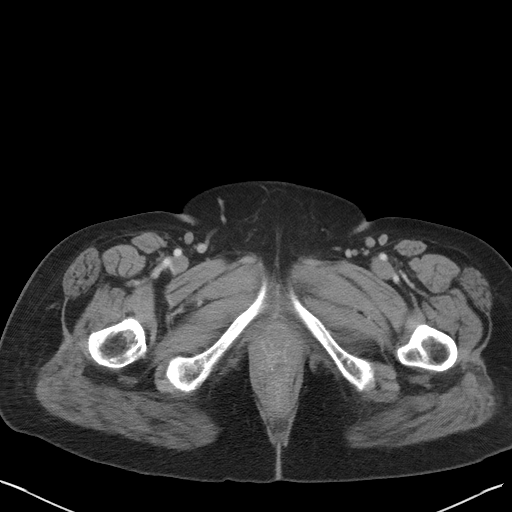
[im 6/81  bone]
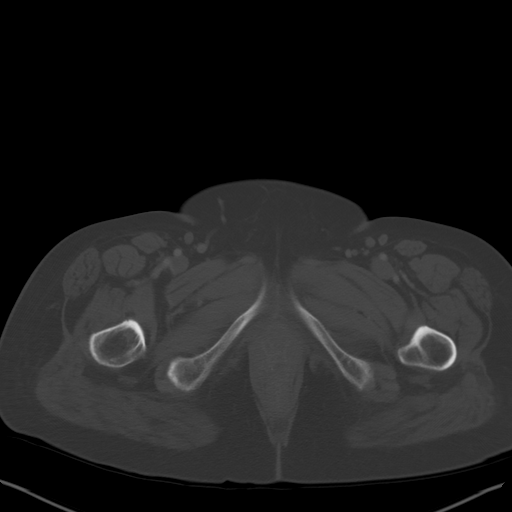
[im 11/81  soft-tissue]
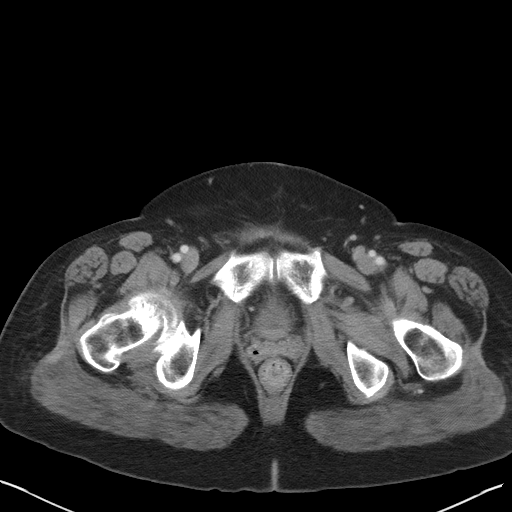
[im 16/81  soft-tissue]
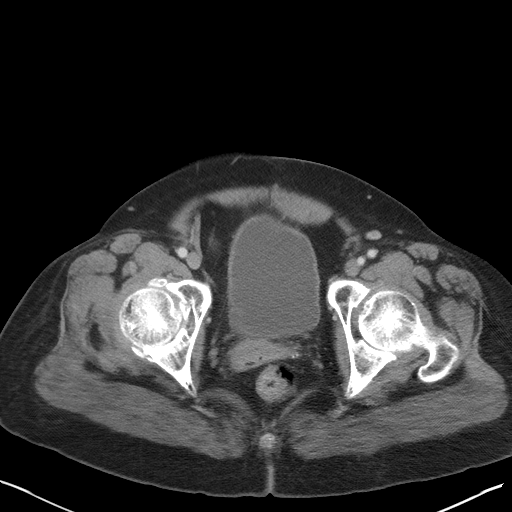
[im 26/81  soft-tissue]
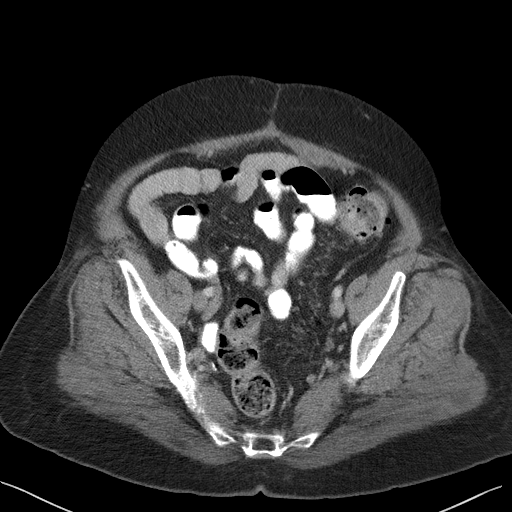
[im 31/81  soft-tissue]
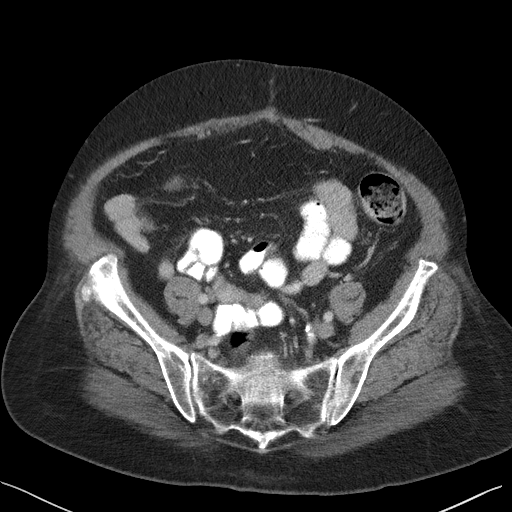
[im 36/81  soft-tissue]
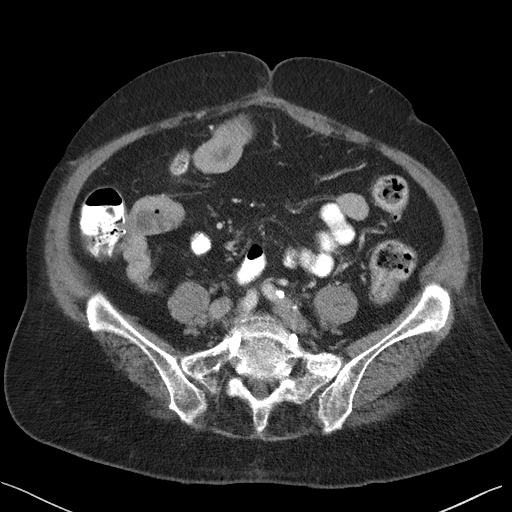
[im 41/81  soft-tissue]
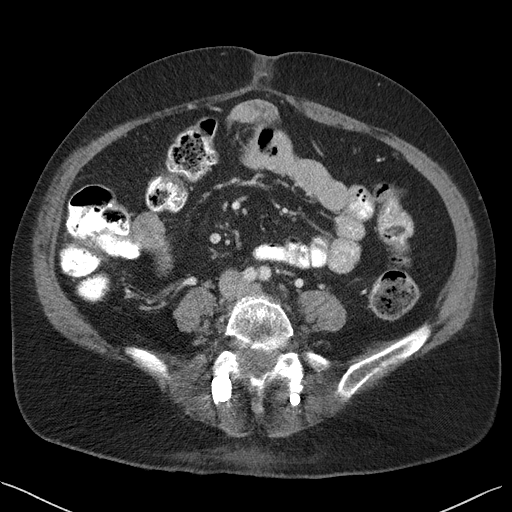
[im 46/81  soft-tissue]
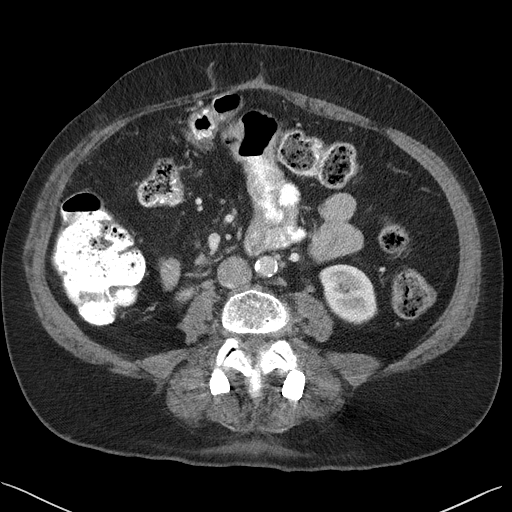
[im 51/81  soft-tissue]
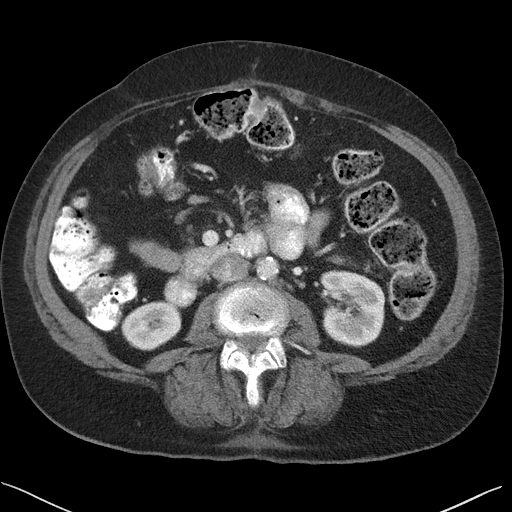
[im 51/81  bone]
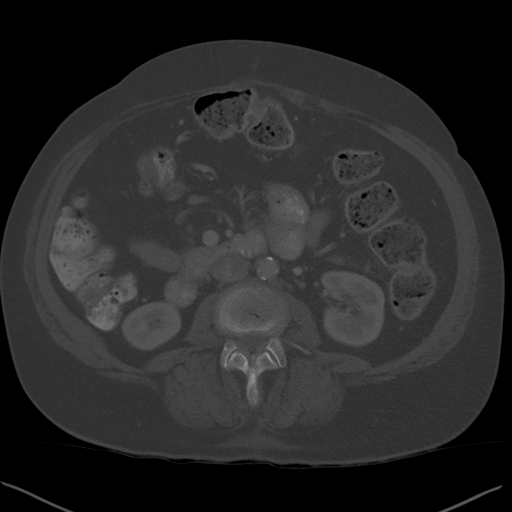
[im 56/81  soft-tissue]
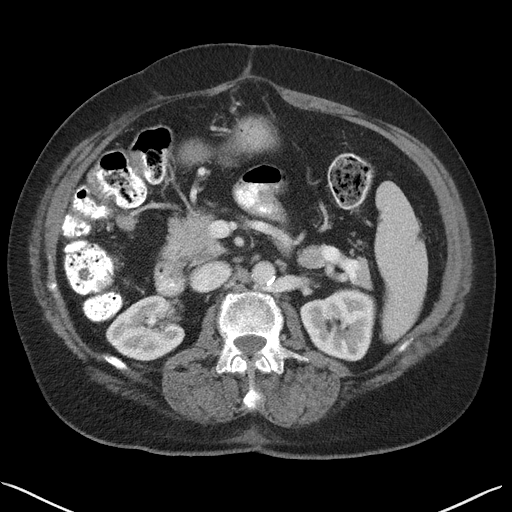
[im 66/81  soft-tissue]
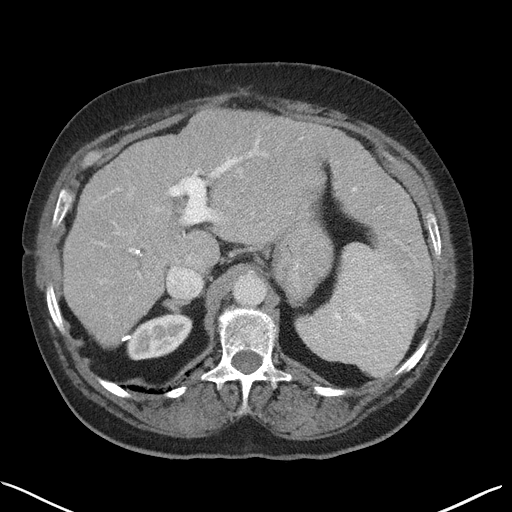
[im 71/81  soft-tissue]
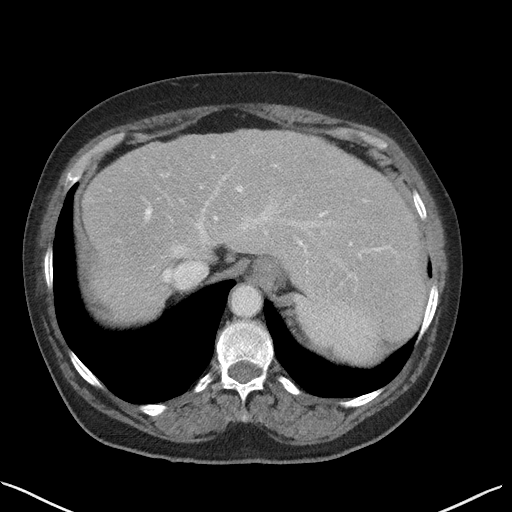
[im 76/81  soft-tissue]
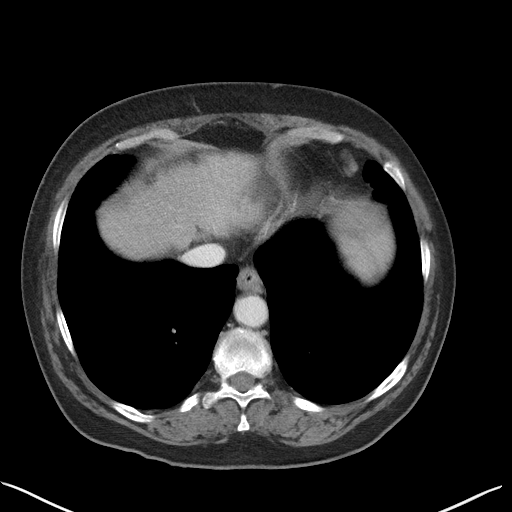

[Series 5: coronal st · coronal · 0.70mm/px · 3 of 101 slices shown]
[im 34/101  soft-tissue]
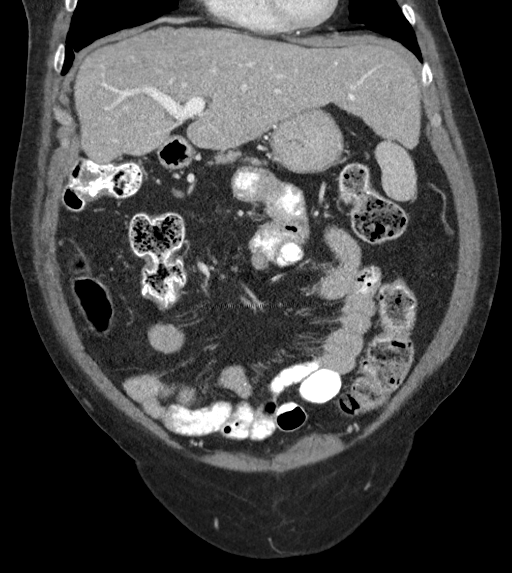
[im 45/101  soft-tissue]
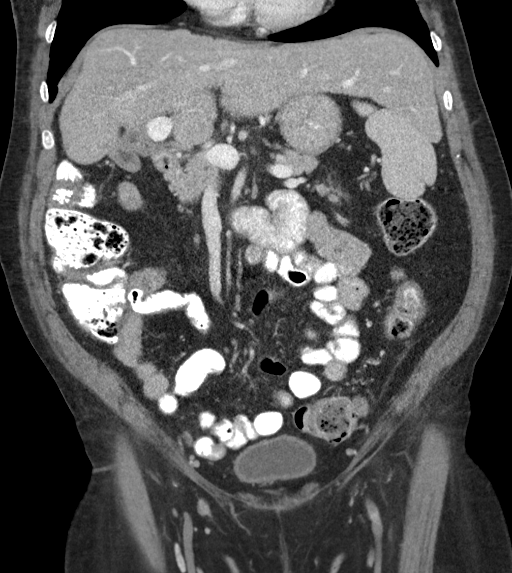
[im 56/101  soft-tissue]
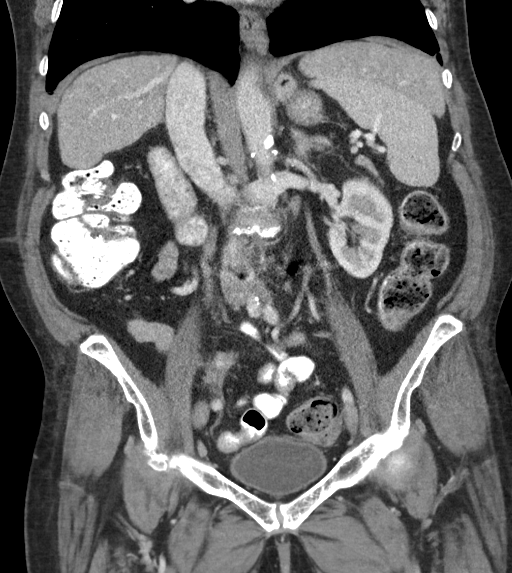

[16 of 46 positions shown; findings below may reference images not displayed]

FINDINGS: Lower chest: Lung bases are clear.

Hepatobiliary: Again demonstrated, lobular contour of the liver. The
RIGHT hepatic lobe is shrunken in the LEFT hepatic lobe is enlarged.
Caudate lobe is prominent. No enhancing lesion within the liver
parenchyma. Patient status post cholecystectomy. Scattered foci of
pneumobilia decreased compared to prior. No duct dilatation.

Pancreas: Pancreas is normal. No ductal dilatation. No pancreatic
inflammation.

Spleen: Spleen is normal volume. Portal vein patent. Splenic vein
patent

Adrenals/urinary tract: Adrenal glands and kidneys are normal. The
ureters and bladder normal.

Stomach/Bowel: Stomach, small bowel, appendix, and cecum are normal.
The colon and rectosigmoid colon are normal.

Vascular/Lymphatic: Abdominal aorta is normal caliber with
atherosclerotic calcification. There is no retroperitoneal or
periportal lymphadenopathy. No pelvic lymphadenopathy.

Reproductive: Uterus and adnexa are normal

Other: No ascites

Musculoskeletal: Posterior lumbar fusion.
IMPRESSION: 1. Morphologic changes in the liver consistent cirrhosis. No
ascites. No enhancing lesion to suggest hepatoma. No change from
comparison CT.
2. No acute findings in the abdomen pelvis.

## 2021-04-13 ENCOUNTER — Telehealth: Payer: Self-pay | Admitting: Internal Medicine

## 2021-04-13 NOTE — Telephone Encounter (Signed)
Recall for ultrasound 

## 2021-04-13 NOTE — Telephone Encounter (Signed)
Letter mailed

## 2021-06-30 ENCOUNTER — Encounter: Payer: Self-pay | Admitting: Internal Medicine

## 2021-06-30 ENCOUNTER — Ambulatory Visit: Payer: 59 | Admitting: Gastroenterology

## 2021-07-21 ENCOUNTER — Telehealth: Payer: Self-pay

## 2021-07-21 NOTE — Telephone Encounter (Signed)
Patient called, says she picked up her Truvada from the pharmacy, but is confused as to what it is for. She was told by the pharmacist that it is a preventative medication, but she wants to make sure this is true. She wants confirmation that she does not have HIV.   RN explained that Truvada is used for PrEP as HIV prevention and that according to her most recent lab work, she does not have HIV. Emphasized importance of follow up every 3 months to ensure this remains the case. Patient verbalized understanding and has no further questions.   She says she was out of town for a while and had another provider fill the medication for her.   Scheduled for follow up with Dr. Tommy Medal.   Beryle Flock, RN

## 2021-07-31 ENCOUNTER — Ambulatory Visit: Payer: 59 | Admitting: Infectious Disease

## 2021-08-23 DEATH — deceased

## 2021-09-28 ENCOUNTER — Other Ambulatory Visit (HOSPITAL_COMMUNITY): Payer: Self-pay

## 2023-07-08 ENCOUNTER — Encounter: Payer: Self-pay | Admitting: *Deleted

## 2023-09-26 ENCOUNTER — Encounter: Payer: Self-pay | Admitting: Acute Care
